# Patient Record
Sex: Female | Born: 1960 | Race: White | Hispanic: No | Marital: Married | Smoking: Never smoker
Health system: Southern US, Community
[De-identification: ages and names within clinical notes are randomized; demographics above are authoritative.]

## PROBLEM LIST (undated history)

## (undated) DIAGNOSIS — I8393 Asymptomatic varicose veins of bilateral lower extremities: Secondary | ICD-10-CM

## (undated) DIAGNOSIS — R600 Localized edema: Secondary | ICD-10-CM

## (undated) DIAGNOSIS — Z87898 Personal history of other specified conditions: Secondary | ICD-10-CM

## (undated) DIAGNOSIS — C8590 Non-Hodgkin lymphoma, unspecified, unspecified site: Secondary | ICD-10-CM

## (undated) DIAGNOSIS — R011 Cardiac murmur, unspecified: Secondary | ICD-10-CM

## (undated) DIAGNOSIS — G629 Polyneuropathy, unspecified: Secondary | ICD-10-CM

## (undated) DIAGNOSIS — D649 Anemia, unspecified: Secondary | ICD-10-CM

## (undated) DIAGNOSIS — F329 Major depressive disorder, single episode, unspecified: Secondary | ICD-10-CM

## (undated) DIAGNOSIS — M542 Cervicalgia: Secondary | ICD-10-CM

## (undated) DIAGNOSIS — F32A Depression, unspecified: Secondary | ICD-10-CM

## (undated) DIAGNOSIS — R569 Unspecified convulsions: Secondary | ICD-10-CM

## (undated) DIAGNOSIS — E785 Hyperlipidemia, unspecified: Secondary | ICD-10-CM

## (undated) DIAGNOSIS — G8929 Other chronic pain: Secondary | ICD-10-CM

## (undated) DIAGNOSIS — G4730 Sleep apnea, unspecified: Secondary | ICD-10-CM

## (undated) DIAGNOSIS — R51 Headache: Secondary | ICD-10-CM

## (undated) DIAGNOSIS — I471 Supraventricular tachycardia, unspecified: Secondary | ICD-10-CM

## (undated) DIAGNOSIS — R519 Headache, unspecified: Secondary | ICD-10-CM

## (undated) DIAGNOSIS — R609 Edema, unspecified: Secondary | ICD-10-CM

## (undated) DIAGNOSIS — I2510 Atherosclerotic heart disease of native coronary artery without angina pectoris: Secondary | ICD-10-CM

## (undated) DIAGNOSIS — Z973 Presence of spectacles and contact lenses: Secondary | ICD-10-CM

## (undated) DIAGNOSIS — I493 Ventricular premature depolarization: Secondary | ICD-10-CM

## (undated) DIAGNOSIS — M549 Dorsalgia, unspecified: Secondary | ICD-10-CM

## (undated) DIAGNOSIS — M793 Panniculitis, unspecified: Secondary | ICD-10-CM

## (undated) HISTORY — PX: CATARACT EXTRACTION: SUR2

## (undated) HISTORY — PX: CHOLECYSTECTOMY: SHX55

## (undated) HISTORY — PX: VENOUS THROMBECTOMY: SHX834

## (undated) HISTORY — DX: Polyneuropathy, unspecified: G62.9

## (undated) HISTORY — DX: Non-Hodgkin lymphoma, unspecified, unspecified site: C85.90

## (undated) HISTORY — DX: Supraventricular tachycardia, unspecified: I47.10

## (undated) HISTORY — DX: Personal history of other specified conditions: Z87.898

## (undated) HISTORY — DX: Depression, unspecified: F32.A

## (undated) HISTORY — PX: BREAST SURGERY: SHX581

## (undated) HISTORY — DX: Unspecified convulsions: R56.9

## (undated) HISTORY — DX: Atherosclerotic heart disease of native coronary artery without angina pectoris: I25.10

## (undated) HISTORY — PX: VENA CAVA FILTER PLACEMENT: SHX1085

## (undated) HISTORY — DX: Hyperlipidemia, unspecified: E78.5

## (undated) HISTORY — DX: Ventricular premature depolarization: I49.3

## (undated) HISTORY — PX: SKIN SURGERY: SHX2413

## (undated) HISTORY — PX: COLONOSCOPY: SHX174

## (undated) HISTORY — PX: OTHER SURGICAL HISTORY: SHX169

## (undated) HISTORY — DX: Localized edema: R60.0

## (undated) HISTORY — DX: Anemia, unspecified: D64.9

## (undated) HISTORY — DX: Edema, unspecified: R60.9

## (undated) HISTORY — DX: Supraventricular tachycardia: I47.1

## (undated) HISTORY — DX: Major depressive disorder, single episode, unspecified: F32.9

## (undated) HISTORY — PX: LYMPH NODE DISSECTION: SHX5087

## (undated) HISTORY — PX: CATARACT EXTRACTION W/ INTRAOCULAR LENS  IMPLANT, BILATERAL: SHX1307

---

## 2005-04-14 HISTORY — PX: GASTRIC BYPASS: SHX52

## 2008-10-14 ENCOUNTER — Encounter: Payer: Self-pay | Admitting: Cardiology

## 2008-10-17 ENCOUNTER — Encounter: Payer: Self-pay | Admitting: Cardiology

## 2008-10-18 ENCOUNTER — Ambulatory Visit: Payer: Self-pay | Admitting: Cardiology

## 2008-11-04 DIAGNOSIS — Z8669 Personal history of other diseases of the nervous system and sense organs: Secondary | ICD-10-CM | POA: Insufficient documentation

## 2008-11-04 DIAGNOSIS — Z87898 Personal history of other specified conditions: Secondary | ICD-10-CM | POA: Insufficient documentation

## 2008-11-04 DIAGNOSIS — F329 Major depressive disorder, single episode, unspecified: Secondary | ICD-10-CM

## 2008-11-14 ENCOUNTER — Ambulatory Visit: Payer: Self-pay

## 2008-11-14 ENCOUNTER — Encounter: Payer: Self-pay | Admitting: Cardiology

## 2008-11-29 ENCOUNTER — Ambulatory Visit: Payer: Self-pay | Admitting: Cardiology

## 2009-01-29 ENCOUNTER — Encounter: Payer: Self-pay | Admitting: Cardiology

## 2009-01-31 ENCOUNTER — Ambulatory Visit: Payer: Self-pay | Admitting: Cardiology

## 2009-01-31 DIAGNOSIS — R9431 Abnormal electrocardiogram [ECG] [EKG]: Secondary | ICD-10-CM

## 2009-02-06 ENCOUNTER — Telehealth (INDEPENDENT_AMBULATORY_CARE_PROVIDER_SITE_OTHER): Payer: Self-pay

## 2009-02-07 ENCOUNTER — Ambulatory Visit: Payer: Self-pay | Admitting: Cardiology

## 2009-02-07 ENCOUNTER — Ambulatory Visit: Payer: Self-pay

## 2009-02-07 ENCOUNTER — Encounter (HOSPITAL_COMMUNITY): Admission: RE | Admit: 2009-02-07 | Discharge: 2009-04-11 | Payer: Self-pay | Admitting: Cardiology

## 2009-02-14 ENCOUNTER — Telehealth (INDEPENDENT_AMBULATORY_CARE_PROVIDER_SITE_OTHER): Payer: Self-pay | Admitting: *Deleted

## 2009-02-16 ENCOUNTER — Ambulatory Visit: Payer: Self-pay | Admitting: Cardiology

## 2009-02-16 ENCOUNTER — Ambulatory Visit (HOSPITAL_COMMUNITY): Admission: RE | Admit: 2009-02-16 | Discharge: 2009-02-16 | Payer: Self-pay | Admitting: Cardiology

## 2010-05-14 NOTE — Consult Note (Signed)
Summary: [facility]  [facility]   Imported By: Marylou Mccoy 04/16/2009 12:21:10  _____________________________________________________________________  External Attachment:    Type:   Image     Comment:   External Document

## 2010-05-14 NOTE — Letter (Signed)
Summary: [facility]charge Instructions  [facility]   Imported By: Marylou Mccoy 04/16/2009 12:25:26  _____________________________________________________________________  External Attachment:    Type:   Image     Comment:   External Document

## 2010-07-17 LAB — BASIC METABOLIC PANEL
Calcium: 7.7 mg/dL — ABNORMAL LOW (ref 8.4–10.5)
Creatinine, Ser: 0.55 mg/dL (ref 0.4–1.2)
GFR calc Af Amer: >60 mL/min (ref >60)
GFR calc non Af Amer: >60 mL/min (ref >60)
Sodium: 138 mEq/L (ref 135–145)

## 2010-07-17 LAB — APTT: aPTT: 28 seconds (ref 24–37)

## 2010-07-17 LAB — CBC
Hemoglobin: 11.6 g/dL — ABNORMAL LOW (ref 12.0–15.0)
RBC: 3.56 MIL/uL — ABNORMAL LOW (ref 3.87–5.11)

## 2010-07-17 LAB — PROTIME-INR
INR: 1.08 (ref 0.00–1.49)
Prothrombin Time: 13.9 seconds (ref 11.6–15.2)

## 2010-08-27 NOTE — Assessment & Plan Note (Signed)
[facility]                            CARDIOLOGY OFFICE NOTE   Angela, Michael                     MRN:          [id number]  DATE:11/29/2008                            DOB:          1960/05/22    PRIMARY CARE PHYSICIAN:  Bennie Pierini, NP at [facility].   REASON FOR PRESENTATION:  Evaluate the patient with questionable  syncope.   HISTORY OF PRESENT ILLNESS:  The patient presents for followup of the  above.  Her previous presentation is extensively outlined in the July 7  note.  I was able to obtain records from [facility], which suggested a  seizure, but it was not conclusive.  There is no obvious cardiac  etiology identified when she had her event and was hospitalized there  earlier this year.  I did order a Holter monitor for 48 hours.  This  demonstrates some sinus bradycardia with an average rate at 55.  The  lowest was 38.  She had some premature ventricular contractions with one  couple an occasional bigeminy.  There were no symptoms related to this.  She had occasional rare premature atrial contractions.  She had no  sustained pauses or tachy arrhythmias.  No symptoms were reported.  She  also had an echocardiogram, which demonstrated no significant  abnormalities.   Since I last saw her, she has had no further events.  She said these  episodes have been few and far between as described.  She has had no  palpitations.  She has had no shortness of breath, PND, or orthopnea.  She will occasionally get some chest tightness when she is angry and  agitated and yelling at her daughter.  This has been a stable pattern.   PAST MEDICAL HISTORY:  Depression, gastric bypass (she lost 345 pounds),  blood clot resected from right arm, lymph nodes resected, removal of  excess skin.   ALLERGIES:  ADVICOR.   MEDICATIONS:  Vitamin D, fluoxetine, furosemide 40 mg daily,  multivitamin, and fentanyl patch.   REVIEW OF SYSTEMS:  As stated in the HPI and otherwise negative for all  other systems.   PHYSICAL EXAMINATION:  GENERAL:  The patient is pleasant and in no  distress.  VITAL SIGNS:  Blood pressure 110/70, heart rate 60 and regular, weight  173 pounds, body mass index is 30.  HEENT:  Eyes are unremarkable, pupils are equal, round, and reactive to  light, fundi not visualized, oral mucosa unremarkable.  NECK:  No  jugular venous distention at 45 degrees, carotid upstroke brisk and  symmetrical.  No bruits, no thyromegaly.  LYMPHATICS:  No adenopathy.  LUNGS:  Clear to auscultation bilaterally.  BACK:  No costovertebral angle tenderness.  CHEST:  Unremarkable.  HEART:  PMI is not displaced or sustained, S1 and S2 within normal.  No  S3, no S4, no clicks, no rubs, no murmurs.  ABDOMEN:  Obese, positive bowel sounds, normal in frequency and pitch,  no bruits, no rebound, no guarding, no midline pulsatile, no mass, no  organomegaly.  SKIN:  No rashes, no nodules.  EXTREMITIES:  2+ pulses, no edema, cyanosis, or clubbing.  NEUROLOGIC:  Oriented to person, place, and time.  Cranial nerves II  through XII grossly intact, motor grossly intact.   ASSESSMENT AND PLAN:  1. Syncope.  The patient had an episode of loss of consciousness with      questionable seizure.  I do not see a cardiac etiology.  She does      have sinus bradycardia and some rare ectopy, but I do not think      this is related.  She otherwise seems to have a structurally normal      heart.  At this point, I am will suggest a plain old exercise      treadmill test (POET) given the fact that she has some chest      discomfort when she is agitated.  However, I think this is a      pretest probability of obstructive coronary artery disease causing      this pain or as an etiology of her syncope is quite low.  I will      suggest that the treadmill test can be done at [facility].      I would suggest that she see a  neurologist as she has been treated      in the past for seizures and this was the discharge diagnosis when      she had her event recently.  I would be happy to see her following      this if they are still any cardiac questions.  2. Ventricular/atrial ectopy.  She is asymptomatic with this.  At this      point, no further therapy is suggested.  3. Obesity.  I applaud her tremendous weight loss and encouraged more      of the same.  4. Followup.  I will see her again as needed based on the above      suggested workup.     Rollene Rotunda, MD, [facility]  Electronically Signed    JH/MedQ  DD: 11/29/2008  DT: 11/30/2008  Job #: [id number]   cc:   Bennie Pierini

## 2010-08-27 NOTE — Assessment & Plan Note (Signed)
[facility]                            CARDIOLOGY OFFICE NOTE   Angela Michael, SCHEERER                     MRN:          [id number]  DATE:10/18/2008                            DOB:          March 25, 1961    PRIMARY CARE PHYSICIAN:  Bennie Pierini.   REASON FOR PRESENTATION:  Evaluate the patient with questionable  syncope.   HISTORY OF PRESENT ILLNESS:  The patient is 50 years old.  She had a  history of questionable seizure in March.  This was a witnessed event by  her husband and there was apparently some seizure activity.  It happened  while she was walking up an incline in her yard.  She was initially  treated with Dilantin, which was discontinued.  An EEG was obtained and  was normal per the patient's report.  She was taken off this medication.  She had another event on October 14, 2008.  She was standing in a [employer]  in [place].  She felt like she was going to lose consciousness.  She  told her daughters.  Next thing, she recalls she was in the emergency  room at [facility].  Her daughters apparently saw some seizure  activity, though they are not here to corroborate.  She was told that  she had CPR and her heart rate was very low.  She was, however, not  admitted to the hospital and was kept only in the emergency room.  Discharge instructions mentioned a seizure, but I do not see any other  records from that evaluation.  The patient did not urinate on herself.  She did not have any trauma.  She did not recall any palpitations.   Since that event, she has felt well.  She says that typically she is  active.  She does not exercise, but she does a lot of work around the  yard.  With this, she denies any chest discomfort, neck, or arm  discomfort.  She does not have any palpitations typically and has had no  orthostatic symptoms or presyncope.  She has had no shortness of breath.  Denies any PND or orthopnea.  She does have bradycardia, but she  says  her heart rates always been low.   PAST MEDICAL HISTORY:  Depression.   PAST SURGICAL HISTORY:  Gastric bypass (she says she had lost 345  pounds), blood clot resection from right arm, lymph node resected, and  removal of excess skin.   ALLERGIES:  ADVICOR.   MEDICATIONS:  1. Fentanyl patch.  2. Simvastatin 40 mg daily.  3. Gabapentin.  4. Vitamin D.  5. Furosemide 40 mg daily.  6. Fluoxetine 40 mg daily.  7. Aspirin 81 mg daily.  8. Loratadine 10 mg daily.  9. Calcium.  10.Flonase.  11.B12.   SOCIAL HISTORY:  The patient is not married.  She has 2 children.  She  currently does not work.  She is not smoking cigarettes.  She will  occasionally drink alcohol.   FAMILY HISTORY:  Noncontributory for early coronary artery disease,  sudden cardiac death, syncope, and cardiomyopathy.   REVIEW  OF SYSTEMS:  As stated in the HPI, otherwise negative for all  other systems.   PHYSICAL EXAMINATION:  GENERAL:  The patient is pleasant in no distress.  VITAL SIGNS:  Blood pressure is 124/76, heart rate 49 and regular, and  weight 173 pounds.  HEENT:  Eyelids are unremarkable; pupils equal, round, and reactive to  light; fundi not visualized; oral mucosa unremarkable.  NECK:  No jugular venous distention at 45 degrees; carotid upstroke  brisk and symmetric; no bruits, no thyromegaly.  LYMPHATICS:  No cervical, axillary, or inguinal adenopathy.  LUNGS:  Clear to auscultation bilaterally.  BACK:  No costovertebral angle tenderness.  CHEST:  Unremarkable.  HEART:  PMI not displaced or sustained; S1 and S2 within normal limits;  no S3, no S4; no clicks, no rubs, and no murmurs.  ABDOMEN:  Obese;  positive bowel sounds; normal in frequency and pitch; no bruits, no  rebound, no guarding; no midline pulsatile mass, no hepatomegaly, no  splenomegaly.  SKIN:  No rashes, no nodules.  EXTREMITIES:  2+ pulses throughout; no edema, no cyanosis, no clubbing.  NEUROLOGIC:  Oriented to  person, place, and time; cranial nerves II-XII  grossly intact; motor grossly intact.   EKG:  Sinus bradycardia, rate 42, axis within normal limits, intervals  within normal limits, no acute ST-T wave changes.   ASSESSMENT AND PLAN:  1. Syncope.  The patient had an episode and could it have been a      seizure versus syncope.  Observers and other physicians apparently      labeled this as a seizure disorder, but I am not clear on the      authentication of this diagnosis.  She does not have any symptoms      related to her resting bradycardia.  I would like to start from a      cardiac standpoint with a 48-hour Holter monitor and an      echocardiogram.  She does not want to have this done until she gets      back from a trip she has had planned.  We discussed some warning      signs and the conservative measures should she feel like she is      getting presyncopal.  She understand she cannot drive a car.  I am      also going to try to get records from [facility] to understand      their evaluation.  I have also encouraged her to followup with a      neurologist at [facility] to evaluate her apparently earlier this      year.  2. Dyslipidemia.  The patient reports she does not have      hyperlipidemia, but was put on the simvastatin as a precaution.  I      will deferred her primary care doctor.  3. Depression.  She says this is well treated.  She will continue on      the meds as listed.  4. Obesity.  She has lost a significant amount of weight with a      gastric bypass surgery and has      done beautifully with this.  5. Followup.  I will see her back in about 6 weeks or sooner if      needed.     Rollene Rotunda, MD, [facility]  Electronically Signed    JH/MedQ  DD: 10/18/2008  DT: 10/19/2008  Job #: [id number]   cc:  Bennie Pierini

## 2010-11-21 ENCOUNTER — Encounter: Payer: Self-pay | Admitting: Cardiology

## 2011-09-21 ENCOUNTER — Emergency Department (HOSPITAL_COMMUNITY): Payer: Medicaid Other

## 2011-09-21 ENCOUNTER — Emergency Department (HOSPITAL_COMMUNITY)
Admission: EM | Admit: 2011-09-21 | Discharge: 2011-09-21 | Disposition: A | Discharge status: Home / Self Care | Payer: Medicaid Other | Attending: Emergency Medicine | Admitting: Emergency Medicine

## 2011-09-21 ENCOUNTER — Encounter (HOSPITAL_COMMUNITY): Payer: Self-pay | Admitting: Emergency Medicine

## 2011-09-21 DIAGNOSIS — T148XXA Other injury of unspecified body region, initial encounter: Secondary | ICD-10-CM | POA: Insufficient documentation

## 2011-09-21 DIAGNOSIS — Z7982 Long term (current) use of aspirin: Secondary | ICD-10-CM | POA: Insufficient documentation

## 2011-09-21 DIAGNOSIS — M542 Cervicalgia: Secondary | ICD-10-CM | POA: Insufficient documentation

## 2011-09-21 DIAGNOSIS — Y92410 Unspecified street and highway as the place of occurrence of the external cause: Secondary | ICD-10-CM | POA: Insufficient documentation

## 2011-09-21 DIAGNOSIS — Z79899 Other long term (current) drug therapy: Secondary | ICD-10-CM | POA: Insufficient documentation

## 2011-09-21 DIAGNOSIS — G8929 Other chronic pain: Secondary | ICD-10-CM | POA: Insufficient documentation

## 2011-09-21 HISTORY — DX: Dorsalgia, unspecified: M54.9

## 2011-09-21 HISTORY — DX: Cervicalgia: M54.2

## 2011-09-21 HISTORY — DX: Other chronic pain: G89.29

## 2011-09-21 MED ORDER — METHOCARBAMOL 500 MG PO TABS: 1000.0000 mg | ORAL_TABLET | Freq: Four times a day (QID) | ORAL | Status: AC | PRN

## 2011-09-21 MED ORDER — HYDROMORPHONE HCL PF 1 MG/ML IJ SOLN
1.0000 mg | Freq: Once | INTRAMUSCULAR | Status: AC
  Administered 2011-09-21: 1 mg via INTRAMUSCULAR
  Filled 2011-09-21: qty 1

## 2011-09-21 MED ORDER — OXYCODONE-ACETAMINOPHEN 5-325 MG PO TABS: ORAL_TABLET | ORAL | Status: AC

## 2011-09-21 NOTE — Discharge Instructions (Signed)
RESOURCE GUIDE  Chronic Pain Problems: Contact [facility]  [phone] Patients need to be referred by their primary care doctor.  Insufficient Money for Medicine: Contact United Way:  call "211" or [facility] [phone].  No Primary Care Doctor: - Call [facility] Connect  [phone] - can help you locate a primary care doctor that  accepts your insurance, provides certain services, etc. - Physician Referral Service- 1-800-533-3463  Agencies that provide inexpensive medical care: - [facility]  [phone] - [facility]  [phone] - [facility]  [phone] - [facility] Clinic  [phone] - Planned Parenthood  [phone] - [facility]  [phone]  Medicaid-accepting [place] Providers: - [facility]- [address]  [phone], Mon-Fri 9am-7pm, Sat 9am-1pm - [facility]- [address], [address]  [phone] - [facility]- [address], [address]  [phone] - [facility]- [address] [place] [address]  [phone] - Veita Bland- [address], [address], [phone]  Only accepts [place] Access Medicaid patients after they have their name  applied to their card  Self Pay (no insurance) in [place]: - Sickle Cell Patients: Dr Eric Dean, [facility]  [address], [phone] - [facility]- [address]  [phone]       -     [facility]- [address]       -     [facility]- see information above (Speak to Pam H if you do not have insurance)       -  [facility]- [address], [phone]       -  [facility] [place]- [address],  [phone]       -  [facility]- [address] [place] [address], [phone]       -  Dr Osei-Bonsu-  [address], [place], [phone]       -  [facility]- [address], [phone]       -  [facility]- [address] [place] [address], [phone], also [address], [phone]       -    [facility]- [address], [phone], 1st & 3rd Saturday   every month, 10am-1pm  1) Find a Doctor and Pay Out of Pocket Although you won't have to find out who is covered by your insurance plan, it is a good idea to ask around and get recommendations. You will then need to call the office and see if the doctor you have chosen will accept you as a new patient and what types of options they offer for patients who are self-pay. Some doctors offer discounts or will set up payment plans for their patients who do not have insurance, but you will need to ask so you aren't surprised when you get to your appointment.  2) Contact Your Local Health Department Not all health departments have doctors that can see patients for sick visits, but many do, so it is worth a call to see if yours does. If you don't know where your local health department is, you can check in your phone book. The CDC also has a tool to help you locate your state's health department, and many state websites also have   listings of all of their local health departments.  3) Find a Walk-in Clinic If your illness is not likely to be very severe or complicated, you may want to try a walk in clinic. These are popping up all over the country in pharmacies, drugstores, and shopping centers. They're usually staffed by nurse practitioners or physician assistants that have been trained to treat common illnesses and complaints. They're usually fairly quick and inexpensive. However, if you have serious medical issues or chronic medical problems, these are probably not your best option  STD Testing - [place] [facility] [place], [facility] Clinic, [address], [place], phone [phone] or 1-877-539-9860.  Monday - Friday, call for an appointment. - [place]  [facility] [place], [facility]D Clinic, [address], [place], phone [phone] or 1-877-539-9860.  Monday - Friday, call for an appointment.  Abuse/Neglect: - [place] Child Abuse Hotline (336) 641-3795 - [place] Child Abuse Hotline 800-378-5315 (After Hours)  Emergency Shelter:  [facility] (336) 271-5985  Maternity Homes: - [facility] (336) 275-9566 - [facility] (704) 372-4663  MRSA Hotline #:   832-7006  [place] Resources  [facility] [place]  [employer] Way [place] [facility] [address]                 [address]         [address]  [place]                                               [place]                              [place] Phone:  [phone]                                  Phone:  [phone]                   Phone:  [phone]  [place] [facility], [phone] - [place] Services - [facility]- 1-888-581-9988       -     [facility] in [place], [address],                                  336-349-4454, Insurance  [place] Child Abuse Hotline (336) 342-1394 or (336) 342-3537 (After Hours)   Behavioral Health Services  Substance Abuse Resources: - [facility]  336-882-2125 - [facility] 336-784-9470 - [facility] 336-285-9073 - [facility] 336-845-3988 - Residential & Outpatient Substance Abuse Program  800-659-3381  Psychological Services: - [facility]  832-9600 - [facility]  [phone] - [place] [facility], [address], [place], ACCESS LINE: 1-800-853-5163 or 336-641-4981, Http://www.[facility].com/services/adult.htm  Dental Assistance  If unable to pay or uninsured, contact:  [facility] or [place] [facility] to become qualified for the adult dental  clinic.  Patients with Medicaid: [facility] [address], [phone], [phone]  If unable   to pay, or uninsured, contact [facility] (271-5999) or [place] [facility] ([phone] in [place], [phone] in [place]) to become qualified for the adult dental clinic  Other Low-Cost Community Dental Services: - [facility]- [address], [place], [place], [address], [phone], [address]. [address], 2nd and 4th Thursday of the month at 6:30am.  10 clients each day by appointment, can sometimes see walk-in patients if someone does not show for an appointment. - [facility]- [address], [place], [place], [address], [phone] - [facility]- [address], [place], [place], [address], [phone] - [place] [facility]- [phone] - [facility]- [phone] - [facility]- [phone]     Take the prescriptions as directed.  Apply moist heat or ice to the area(s) of discomfort, for 15 minutes at a time, several times per day for the next few days.  Do not fall asleep on a heating or ice pack.  Call your regular medical doctor on Monday to schedule a follow up appointment this week.  Return to the Emergency Department immediately if worsening.  

## 2011-09-21 NOTE — ED Notes (Signed)
Pt in xray
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
## 2012-07-08 ENCOUNTER — Other Ambulatory Visit: Payer: Self-pay | Admitting: Nurse Practitioner

## 2012-07-09 MED ORDER — FENTANYL 75 MCG/HR TD PT72: 1.0000 | MEDICATED_PATCH | TRANSDERMAL | Status: DC

## 2012-07-09 NOTE — Telephone Encounter (Signed)
rx up front for patient to pick up

## 2012-07-09 NOTE — Telephone Encounter (Signed)
Ready to be picked up

## 2012-08-06 ENCOUNTER — Encounter: Payer: Self-pay | Admitting: Nurse Practitioner

## 2012-08-06 ENCOUNTER — Ambulatory Visit (INDEPENDENT_AMBULATORY_CARE_PROVIDER_SITE_OTHER): Payer: Medicaid Other | Admitting: Nurse Practitioner

## 2012-08-06 VITALS — BP 111/71 | HR 47 | Temp 97.7°F | Ht 64.5 in | Wt 199.0 lb

## 2012-08-06 DIAGNOSIS — E785 Hyperlipidemia, unspecified: Secondary | ICD-10-CM

## 2012-08-06 DIAGNOSIS — R569 Unspecified convulsions: Secondary | ICD-10-CM

## 2012-08-06 DIAGNOSIS — R609 Edema, unspecified: Secondary | ICD-10-CM

## 2012-08-06 DIAGNOSIS — G5690 Unspecified mononeuropathy of unspecified upper limb: Secondary | ICD-10-CM

## 2012-08-06 DIAGNOSIS — D649 Anemia, unspecified: Secondary | ICD-10-CM

## 2012-08-06 DIAGNOSIS — E876 Hypokalemia: Secondary | ICD-10-CM

## 2012-08-06 DIAGNOSIS — G5691 Unspecified mononeuropathy of right upper limb: Secondary | ICD-10-CM

## 2012-08-06 LAB — ANEMIA PANEL 7
ABS Retic: 82.5 10*3/uL (ref 19.0–186.0)
Ferritin: 31 ng/mL (ref 10–291)
Folate: 15.2 ng/mL
MCHC: 33.7 g/dL (ref 30.0–36.0)
Platelets: 160 10*3/uL (ref 150–400)
RBC.: 3.93 MIL/uL (ref 3.87–5.11)
RDW: 14.7 % (ref 11.5–15.5)
TIBC: 374 ug/dL (ref 250–470)
WBC: 4.1 10*3/uL (ref 4.0–10.5)

## 2012-08-06 LAB — COMPLETE METABOLIC PANEL WITH GFR
ALT: 36 U/L — ABNORMAL HIGH (ref 0–35)
BUN: 16 mg/dL (ref 6–23)
CO2: 28 mEq/L (ref 19–32)
Calcium: 8.6 mg/dL (ref 8.4–10.5)
Creat: 0.84 mg/dL (ref 0.50–1.10)
GFR, Est African American: >89 mL/min
GFR, Est Non African American: 81 mL/min
Glucose, Bld: 115 mg/dL — ABNORMAL HIGH (ref 70–99)
Total Bilirubin: 0.8 mg/dL (ref 0.3–1.2)

## 2012-08-06 MED ORDER — FENTANYL 75 MCG/HR TD PT72: 1.0000 | MEDICATED_PATCH | TRANSDERMAL | Status: DC

## 2012-08-06 NOTE — Patient Instructions (Signed)

## 2012-08-06 NOTE — Progress Notes (Signed)
  Subjective:    Patient ID: Angela Michael, female    DOB: 04-06-1961, 52 y.o.   MRN: [id number]  Hyperlipidemia This is a chronic problem. The current episode started more than 1 year ago. The problem is controlled. Recent lipid tests were reviewed and are variable. Exacerbating diseases include obesity. There are no known factors aggravating her hyperlipidemia. Pertinent negatives include no chest pain, focal sensory loss, focal weakness, leg pain, myalgias or shortness of breath. Current antihyperlipidemic treatment includes statins. The current treatment provides significant improvement of lipids. Compliance problems include adherence to diet and adherence to exercise.  Risk factors for coronary artery disease include obesity and post-menopausal.  Hypokalemia Chronic- takes K+ daily. No C/o lower extremity cramping. Peripheral edema Lasix daily which helps. 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 Pernicious anemia b12 injection monthly- No c/o fatigue Depression Patient currently on prozac that works well for her. No C/O SE   Review of Systems  Respiratory: Negative for shortness of breath.   Cardiovascular: Negative for chest pain.  Musculoskeletal: Negative for myalgias.  Neurological: Negative for focal weakness.  All other systems reviewed and are negative.       Objective:   Physical Exam  Constitutional: She is oriented to person, place, and time. She appears well-developed and well-nourished.  HENT:  Nose: Nose normal.  Mouth/Throat: Oropharynx is clear and moist.  Eyes: EOM are normal.  Neck: Trachea normal, normal range of motion and full passive range of motion without pain. Neck supple. No JVD present. Carotid bruit is not present. No thyromegaly present.   Cardiovascular: Normal rate, regular rhythm, normal heart sounds and intact distal pulses.  Exam reveals no gallop and no friction rub.   No murmur heard. Pulmonary/Chest: Effort normal and breath sounds normal.  Abdominal: Soft. Bowel sounds are normal. She exhibits no distension and no mass. There is no tenderness.  Musculoskeletal: Normal range of motion.  Lymphadenopathy:    She has no cervical adenopathy.  Neurological: She is alert and oriented to person, place, and time. She has normal reflexes.  Pain along right upper arm on palpation  Skin: Skin is warm and dry.  Psychiatric: She has a normal mood and affect. Her behavior is normal. Judgment and thought content normal.   BP 111/71  Pulse 47  Temp(Src) 97.7 F (36.5 C) (Oral)  Ht 5' 4.5" (1.638 m)  Wt 199 lb (90.266 kg)  BMI 33.64 kg/m2        Assessment & Plan:  Peripheral edema  Hyperlipidemia with target LDL less than 100 - Plan: NMR Lipoprofile with Lipids  Convulsions/seizures  Neuropathy of upper extremity, right - Plan: fentaNYL (DURAGESIC - DOSED MCG/HR) 75 MCG/HR  Anemia - Plan: Anemia panel 7  Hypokalemia - Plan: COMPLETE METABOLIC PANEL WITH GFR  Continue all meds Labs pending Diet and exercise encouraged Fentanyl patch refilled F/U in 3 months Angela Daphine Deutscher, FNP

## 2012-08-09 LAB — NMR LIPOPROFILE WITH LIPIDS
HDL Size: 8.7 nm — ABNORMAL LOW (ref >=9.2)
HDL-C: 44 mg/dL (ref >=40)
LDL (calc): 85 mg/dL (ref <100)
LDL Particle Number: 1161 nmol/L — ABNORMAL HIGH (ref <1000)
LDL Size: 21 nm (ref >20.5)
LP-IR Score: 56 — ABNORMAL HIGH (ref <=45)
Small LDL Particle Number: 542 nmol/L — ABNORMAL HIGH (ref <=527)
VLDL Size: 50.9 nm — ABNORMAL HIGH (ref <=46.6)

## 2012-08-14 ENCOUNTER — Other Ambulatory Visit: Payer: Self-pay | Admitting: Nurse Practitioner

## 2012-08-28 ENCOUNTER — Other Ambulatory Visit: Payer: Self-pay | Admitting: Nurse Practitioner

## 2012-09-08 ENCOUNTER — Other Ambulatory Visit: Payer: Self-pay | Admitting: Nurse Practitioner

## 2012-09-08 DIAGNOSIS — G5691 Unspecified mononeuropathy of right upper limb: Secondary | ICD-10-CM

## 2012-09-09 ENCOUNTER — Telehealth: Payer: Self-pay | Admitting: Nurse Practitioner

## 2012-09-09 MED ORDER — FENTANYL 75 MCG/HR TD PT72: 1.0000 | MEDICATED_PATCH | TRANSDERMAL | Status: DC

## 2012-09-09 NOTE — Telephone Encounter (Signed)
Last seen 08/06/12 and also prescribed then. Call pt for pickup

## 2012-09-09 NOTE — Telephone Encounter (Signed)
rx up front and pt aware 

## 2012-09-09 NOTE — Telephone Encounter (Signed)
rx ready for pickup 

## 2012-10-06 ENCOUNTER — Other Ambulatory Visit: Payer: Self-pay | Admitting: Nurse Practitioner

## 2012-10-06 DIAGNOSIS — G5691 Unspecified mononeuropathy of right upper limb: Secondary | ICD-10-CM

## 2012-10-07 MED ORDER — FENTANYL 75 MCG/HR TD PT72: 1.0000 | MEDICATED_PATCH | TRANSDERMAL | Status: DC

## 2012-10-07 NOTE — Telephone Encounter (Signed)
last filled 09/09/12, last seen 08/06/12. Call pt when ready

## 2012-10-07 NOTE — Telephone Encounter (Signed)
rx ready for pickup 

## 2012-10-08 NOTE — Telephone Encounter (Signed)
RX up front

## 2012-10-27 ENCOUNTER — Telehealth: Payer: Self-pay
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
## 2012-11-08 ENCOUNTER — Encounter: Payer: Self-pay | Admitting: Nurse Practitioner

## 2012-11-08 ENCOUNTER — Ambulatory Visit (INDEPENDENT_AMBULATORY_CARE_PROVIDER_SITE_OTHER): Payer: Medicaid Other | Admitting: Nurse Practitioner

## 2012-11-08 VITALS — BP 104/69 | HR 55 | Temp 98.2°F | Ht 61.0 in | Wt 196.0 lb

## 2012-11-08 DIAGNOSIS — G5691 Unspecified mononeuropathy of right upper limb: Secondary | ICD-10-CM

## 2012-11-08 DIAGNOSIS — R609 Edema, unspecified: Secondary | ICD-10-CM

## 2012-11-08 DIAGNOSIS — E785 Hyperlipidemia, unspecified: Secondary | ICD-10-CM

## 2012-11-08 DIAGNOSIS — D649 Anemia, unspecified: Secondary | ICD-10-CM

## 2012-11-08 DIAGNOSIS — R569 Unspecified convulsions: Secondary | ICD-10-CM

## 2012-11-08 DIAGNOSIS — E876 Hypokalemia: Secondary | ICD-10-CM

## 2012-11-08 DIAGNOSIS — G5690 Unspecified mononeuropathy of unspecified upper limb: Secondary | ICD-10-CM

## 2012-11-08 MED ORDER — FLUTICASONE PROPIONATE 50 MCG/ACT NA SUSP: 2.0000 | NASAL | Status: DC | PRN

## 2012-11-08 MED ORDER — FENTANYL 75 MCG/HR TD PT72: 1.0000 | MEDICATED_PATCH | TRANSDERMAL | Status: DC

## 2012-11-08 NOTE — Patient Instructions (Addendum)

## 2012-11-08 NOTE — Progress Notes (Signed)
Subjective:    Patient ID: Angela Michael, female    DOB: 08-06-1960, 52 y.o.   MRN: [id number]  Hyperlipidemia This is a chronic problem. The current episode started more than 1 year ago. The problem is uncontrolled. Recent lipid tests were reviewed and are high. Pertinent negatives include no chest pain. Current antihyperlipidemic treatment includes statins. The current treatment provides moderate improvement of lipids. Compliance problems include adherence to diet and adherence to exercise.  Risk factors for coronary artery disease include obesity and post-menopausal.  Seizures Keppra working well no seizure in several years. Peripheral edema Lasix working well to keep edema under control. Hypokalemia K-Dur working well. No C/O lower ext aches Chronic right arm pain and neuropathy Duragesic patches makes pain tolerable- neurotin helps a lot Depression Prozac working well- has occassional bouts of depression but nothing she can't handle  * Diagnosed with Lymphoma 2 months ago and they are trying to determine at this time the stage that she is in- May start radiation soon.  Review of Systems  Constitutional: Negative.   HENT: Negative.   Respiratory: Negative for cough, choking and chest tightness.   Cardiovascular: Negative for chest pain.  Gastrointestinal: Negative.   Endocrine: Negative.   Genitourinary: Negative.   Musculoskeletal: Negative.   Allergic/Immunologic: Negative.   Neurological: Negative.   Psychiatric/Behavioral: Negative.        Objective:   Physical Exam  Constitutional: She is oriented to person, place, and time. She appears well-developed and well-nourished.  HENT:  Nose: Nose normal.  Mouth/Throat: Oropharynx is clear and moist.  Eyes: EOM are normal.  Neck: Trachea normal, normal range of motion and full passive range of motion without pain. Neck supple. No JVD present. Carotid bruit is not present. No thyromegaly present.  Cardiovascular: Normal  rate, regular rhythm, normal heart sounds and intact distal pulses.  Exam reveals no gallop and no friction rub.   No murmur heard. Pulmonary/Chest: Effort normal and breath sounds normal.  Abdominal: Soft. Bowel sounds are normal. She exhibits no distension and no mass. There is no tenderness.  Musculoskeletal: Normal range of motion.  Lymphadenopathy:    She has no cervical adenopathy.  Neurological: She is alert and oriented to person, place, and time. She has normal reflexes.  Skin: Skin is warm and dry.  Psychiatric: She has a normal mood and affect. Her behavior is normal. Judgment and thought content normal.     BP 104/69  Pulse 55  Temp(Src) 98.2 F (36.8 C) (Oral)  Ht 5\' 1"  (1.549 m)  Wt 196 lb (88.905 kg)  BMI 37.05 kg/m2      Assessment & Plan:   1. Peripheral edema   2. Hyperlipidemia with target LDL less than 100   3. Convulsions/seizures   4. Neuropathy of upper extremity, right   5. Anemia   6. Hypokalemia    Orders Placed This Encounter  Procedures  . CMP14+EGFR  . NMR, lipoprofile   Meds ordered this encounter  Medications  . fluticasone (FLONASE) 50 MCG/ACT nasal spray    Sig: Place 2 sprays into the nose as needed. For allergies    Dispense:  16 g    Refill:  5    Order Specific Question:  Supervising Provider    Answer:  Ernestina Penna [[id number]]  . fentaNYL (DURAGESIC - DOSED MCG/HR) 75 MCG/HR    Sig: Place 1 patch (75 mcg total) onto the skin every 3 (three) days.    Dispense:  10 patch  Refill:  0    Order Specific Question:  Supervising Provider    Answer:  Deborra Medina   Continue all othe rmeds Diet and exercise encouraged Health Maintenance reviewed  Mary-Margaret Daphine Deutscher, FNP

## 2012-11-09 LAB — CMP14+EGFR
ALT: 22 IU/L (ref 0–32)
AST: 24 IU/L (ref 0–40)
Albumin/Globulin Ratio: 1.9 (ref 1.1–2.5)
CO2: 23 mmol/L (ref 18–29)
Calcium: 8.4 mg/dL — ABNORMAL LOW (ref 8.7–10.2)
Creatinine, Ser: 0.88 mg/dL (ref 0.57–1.00)
GFR calc non Af Amer: 77 mL/min/{1.73_m2} (ref >59)
Globulin, Total: 2 g/dL (ref 1.5–4.5)
Glucose: 74 mg/dL (ref 65–99)
Potassium: 4.4 mmol/L (ref 3.5–5.2)
Sodium: 141 mmol/L (ref 134–144)
Total Protein: 5.7 g/dL — ABNORMAL LOW (ref 6.0–8.5)

## 2012-11-09 LAB — NMR, LIPOPROFILE

## 2012-11-09 LAB — LIPID PANEL
Chol/HDL Ratio: 2.3 ratio units (ref 0.0–4.4)
LDL Calculated: 56 mg/dL (ref 0–99)

## 2012-11-10 LAB — LIPID PANEL
Cholesterol, Total: 131 mg/dL (ref 100–199)
HDL: 57 mg/dL (ref >39)
Triglycerides: 86 mg/dL (ref 0–149)

## 2012-11-12 ENCOUNTER — Ambulatory Visit (INDEPENDENT_AMBULATORY_CARE_PROVIDER_SITE_OTHER): Payer: Medicaid Other | Admitting: Physician Assistant

## 2012-11-12 ENCOUNTER — Encounter: Payer: Self-pay | Admitting: Physician Assistant

## 2012-11-12 VITALS — BP 104/76 | HR 60 | Ht 61.75 in | Wt 195.1 lb

## 2012-11-12 DIAGNOSIS — R9431 Abnormal electrocardiogram [ECG] [EKG]: Secondary | ICD-10-CM

## 2012-11-12 DIAGNOSIS — R079 Chest pain, unspecified: Secondary | ICD-10-CM

## 2012-11-12 DIAGNOSIS — I498 Other specified cardiac arrhythmias: Secondary | ICD-10-CM

## 2012-11-12 DIAGNOSIS — I471 Supraventricular tachycardia: Secondary | ICD-10-CM

## 2012-11-12 MED ORDER — NITROGLYCERIN 0.4 MG SL SUBL: 0.4000 mg | SUBLINGUAL_TABLET | SUBLINGUAL | Status: DC | PRN

## 2012-11-12 NOTE — Patient Instructions (Signed)
   Nitroglycerin as needed for severe chest pain only Continue all other current medications. Your physician wants you to follow up in: 6 months.  You will receive a reminder letter in the mail one-two months in advance.  If you don't receive a letter, please call our office to schedule the follow up appointment

## 2012-11-12 NOTE — Assessment & Plan Note (Signed)
Status post gastric bypass surgery in 2007, at [facility]

## 2012-11-12 NOTE — Assessment & Plan Note (Addendum)
Continue current medication regimen consisting of low-dose ASA and statin therapy. Results of the recent dobutamine stress echocardiogram were reviewed with the patient, which was interpreted as a probable negative test for inducible ischemia. We also reviewed the results of her previous cardiac catheterization in 2010, following a false-positive stress test, with results as outlined above. If patient were to have recurrent CP, however, we will need to strongly consider proceeding with a repeat diagnostic coronary angiogram, given that she just completed a stress test. Of note, however, she denies any history of exertional CP, and her recent evaluation was notable for NL troponins and no focal WMAs on echocardiography.

## 2012-11-12 NOTE — Progress Notes (Signed)
Primary Cardiologist: Prentice Docker, MD (new)   HPI: 52 year old female with history of nonobstructive CAD, by cardiac catheterization in November 2010, at [facility], following a false positive stress test, who now presents for post hospital followup and further evaluation of recent episode of chest pain. She was recently briefly hospitalized at [facility], ruled out for MI with NL troponins, and had a CTA of the chest which was negative for pulmonary embolus.  She was then cleared to proceed with a dobutamine stress echocardiogram for risk stratification. Although she was able to attain 110% PMHR, the study was stopped secondary to development of SVT at 187 bpm. She was also noted to have frequent PVCs and NSVT. Rest images yielded EF 50-55%, with no focal WMAs. The study was interpreted as probably negative for inducible ischemia.  Clinically, she denies any history of exertional CP. She was hospitalized for a singular episode of CP, which occurred while laying in bed. She characterized it as sudden in onset, sharp in quality, and very intense (10/10). She also developed associated chills, diaphoresis, and trembling of the left cheek. Since this isolated episode, she has not had any recurrent CP.  Of note, patient underwent gastric bypass surgery in 2007, at [facility]. She denies any symptoms suggestive of active reflux disease. She also states that she is no longer on diabetic medication.   EKG today, reviewed by me, indicates NSR 60 bpm; poor R wave progression  Allergies  Allergen Reactions  . Advicor (Niacin-Lovastatin Er) Rash    Current Outpatient Prescriptions  Medication Sig Dispense Refill  . aspirin EC 81 MG tablet Take 81 mg by mouth daily.      Marland Kitchen atorvastatin (LIPITOR) 40 MG tablet TAKE 1 TABLET BY MOUTH AT BEDTIME  30 tablet  4  . Black Cohosh 40 MG CAPS Take 1 capsule by mouth daily.       . Cholecalciferol (VITAMIN D3) 400 UNITS tablet Take 400 Units by mouth daily.         . Cyanocobalamin (VITAMIN B-12 IJ) Inject 1 mL as directed every 30 (thirty) days.      . Fe Fum-FePoly-FA-Vit C-Vit B3 (INTEGRA F PO) Take by mouth.      . fentaNYL (DURAGESIC - DOSED MCG/HR) 75 MCG/HR Place 1 patch (75 mcg total) onto the skin every 3 (three) days.  10 patch  0  . FLUoxetine (PROZAC) 40 MG capsule Take 80 mg by mouth daily.       . fluticasone (FLONASE) 50 MCG/ACT nasal spray Place 2 sprays into the nose as needed. For allergies  16 g  5  . furosemide (LASIX) 40 MG tablet Take 1 tablet (40 mg total) by mouth daily.  30 tablet  2  . gabapentin (NEURONTIN) 300 MG capsule Take 600 mg by mouth 2 (two) times daily.       Marland Kitchen levETIRAcetam (KEPPRA) 500 MG tablet Take 1,000 mg by mouth 2 (two) times daily.       Marland Kitchen loratadine (CLARITIN) 10 MG tablet Take 10 mg by mouth as needed. For allergies      . Multiple Vitamin (MULTIVITAMIN WITH MINERALS) TABS Take 1 tablet by mouth daily.      . Nutritional Supplements (ESTROVEN PMS) TABS Take 1 tablet by mouth daily.       . potassium chloride SA (K-DUR,KLOR-CON) 20 MEQ tablet Take 20 mEq by mouth 2 (two) times daily. For 15 days      . nitroGLYCERIN (NITROSTAT) 0.4 MG SL tablet Place  1 tablet (0.4 mg total) under the tongue every 5 (five) minutes as needed for chest pain.  25 tablet  3   No current facility-administered medications for this visit.    Past Medical History  Diagnosis Date  . History of syncope   . Depression   . PVC's (premature ventricular contractions)   . Chronic pain   . Chronic back pain   . Chronic neck pain   . Peripheral edema   . Hyperlipidemia   . Lymphoma   . Anemia   . Seizures   . Neuropathy   . SVT (supraventricular tachycardia)     Past Surgical History  Procedure Laterality Date  . Gastric bypass      Says she had lost 345 lbs  . Venous thrombectomy      Blood clot resection from right arm  . Lymph node dissection      Resected  . Skin surgery      Removal of excess skin  . Vena cava  filter placement    . Lymph node removed from stomach      History   Social History  . Marital Status: Married    Spouse Name: N/A    Number of Children: N/A  . Years of Education: N/A   Occupational History  . Currently does not work    Social History Main Topics  . Smoking status: Never Smoker   . Smokeless tobacco: Not on file  . Alcohol Use: No     Comment: Occasionally  . Drug Use: No  . Sexually Active: Not on file   Other Topics Concern  . Not on file   Social History Narrative   Married with 2 children    Family History  Problem Relation Age of Onset  . Coronary artery disease Neg Hx   . Sudden death Neg Hx   . Cardiomyopathy Neg Hx   . Cancer Father   . Diabetes Father   . Heart failure Brother   . Cancer Other   . Diabetes Other     ROS: no nausea, vomiting; no fever, chills; no melena, hematochezia; no claudication  PHYSICAL EXAM: BP 104/76  Pulse 60  Ht 5' 1.75" (1.568 m)  Wt 195 lb 1.9 oz (88.506 kg)  BMI 36 kg/m2 GENERAL: 52 year old female, morbidly obese; NAD HEENT: NCAT, PERRLA, EOMI; sclera clear; no xanthelasma NECK: palpable bilateral carotid pulses, no bruits; unable to assess JVD, secondary to neck girth  LUNGS: CTA bilaterally CARDIAC: RRR (S1, S2); no significant murmurs; no rubs or gallops ABDOMEN: soft, protuberant EXTREMETIES: no significant peripheral edema SKIN: warm/dry; no obvious rash/lesions MUSCULOSKELETAL: no joint deformity NEURO: no focal deficit; NL affect   EKG: reviewed and available in Electronic Records   ASSESSMENT & PLAN:  SVT (supraventricular tachycardia) Based on the results of the recent dobutamine stress echocardiogram at [facility], indicating development of SVT at approximately 190 bpm during dobutamine infusion, as well as frequent PVCs and NSVT, we will proceed with a 21 day event monitor to exclude recurrent tachyarrhythmia at rest. It may be that this is what the patient was experiencing with her  recent symptoms, including CP. She denies any recurrent symptoms, however, and denies any history of exertional CP. We will also start her on beta blocker therapy with Toprol-XL 25 mg daily. We will then arrange for early followup and review of monitor results. Of note, I also gave her prescription for NTG, in the event she were to have recurrent CP. We did  review the results of her prior cardiac catheterization in 2010, which yielded nonobstructive CAD and NL LVF, following a false positive stress test.  Chest pain Continue current medication regimen consisting of low-dose ASA and statin therapy. Results of the recent dobutamine stress echocardiogram were reviewed with the patient, which was interpreted as a probable negative test for inducible ischemia. We also reviewed the results of her previous cardiac catheterization in 2010, following a false-positive stress test, with results as outlined above. If patient were to have recurrent CP, however, we will need to strongly consider proceeding with a repeat diagnostic coronary angiogram, given that she just completed a stress test. Of note, however, she denies any history of exertional CP, and her recent evaluation was notable for NL troponins and no focal WMAs on echocardiography.  Morbid obesity Status post gastric bypass surgery in 2007, at [facility]    Gene Reagan Behlke, [facility]

## 2012-11-12 NOTE — Assessment & Plan Note (Signed)
Based on the results of the recent dobutamine stress echocardiogram at [facility], indicating development of SVT at approximately 190 bpm during dobutamine infusion, as well as frequent PVCs and NSVT, we will proceed with a 21 day event monitor to exclude recurrent tachyarrhythmia at rest. It may be that this is what the patient was experiencing with her recent symptoms, including CP. She denies any recurrent symptoms, however, and denies any history of exertional CP. We will also start her on beta blocker therapy with Toprol-XL 25 mg daily. We will then arrange for early followup and review of monitor results. Of note, I also gave her prescription for NTG, in the event she were to have recurrent CP. We did review the results of her prior cardiac catheterization in 2010, which yielded nonobstructive CAD and NL LVF, following a false positive stress test.

## 2012-11-17 ENCOUNTER — Telehealth: Payer: Self-pay | Admitting: *Deleted

## 2012-11-17 DIAGNOSIS — I471 Supraventricular tachycardia: Secondary | ICD-10-CM

## 2012-11-17 NOTE — Telephone Encounter (Signed)
Patient informed that 21 day e-cardio heart monitor will be ordered per Gene Serpe, PA recent OV.  (SVT)  Will hold off on Toprol XL 25mg  daily for now as patient reports heart rates running 40-60's.  Patient verbalized understanding.

## 2012-11-18 NOTE — Telephone Encounter (Signed)
Monitor ordered today.

## 2012-11-21 ENCOUNTER — Other Ambulatory Visit: Payer: Self-pay | Admitting: Nurse Practitioner

## 2012-11-24 DIAGNOSIS — I471 Supraventricular tachycardia: Secondary | ICD-10-CM

## 2012-12-10 ENCOUNTER — Telehealth: Payer: Self-pay | Admitting: Nurse Practitioner

## 2012-12-14 ENCOUNTER — Other Ambulatory Visit: Payer: Self-pay

## 2012-12-14 DIAGNOSIS — G5691 Unspecified mononeuropathy of right upper limb: Secondary | ICD-10-CM

## 2012-12-14 NOTE — Telephone Encounter (Signed)
Last filled 11/08/12  Last seen 11/08/12 MMM   If approved print and route to nurse

## 2012-12-15 ENCOUNTER — Telehealth: Payer: Self-pay | Admitting: Nurse Practitioner

## 2012-12-15 MED ORDER — FENTANYL 75 MCG/HR TD PT72: 1.0000 | MEDICATED_PATCH | TRANSDERMAL | Status: DC

## 2012-12-15 NOTE — Telephone Encounter (Signed)
Pt aware.

## 2012-12-15 NOTE — Telephone Encounter (Signed)
Please inform script ready. thx

## 2013-01-11 ENCOUNTER — Other Ambulatory Visit: Payer: Self-pay | Admitting: Nurse Practitioner

## 2013-01-11 DIAGNOSIS — G5691 Unspecified mononeuropathy of right upper limb: Secondary | ICD-10-CM

## 2013-01-13 MED ORDER — FENTANYL 75 MCG/HR TD PT72: 1.0000 | MEDICATED_PATCH | TRANSDERMAL | Status: DC

## 2013-01-13 NOTE — Telephone Encounter (Signed)
Last seen 11/08/12, last filled 12/14/12

## 2013-01-13 NOTE — Telephone Encounter (Signed)
rx ready for pickup 

## 2013-01-14 ENCOUNTER — Other Ambulatory Visit: Payer: Self-pay | Admitting: Nurse Practitioner

## 2013-01-21 ENCOUNTER — Telehealth: Payer: Self-pay | Admitting: Nurse Practitioner

## 2013-01-24 ENCOUNTER — Ambulatory Visit (INDEPENDENT_AMBULATORY_CARE_PROVIDER_SITE_OTHER): Payer: Medicaid Other | Admitting: Cardiovascular Disease

## 2013-01-24 VITALS — BP 120/79 | HR 66 | Ht 61.75 in | Wt 206.0 lb

## 2013-01-24 DIAGNOSIS — I471 Supraventricular tachycardia: Secondary | ICD-10-CM

## 2013-01-24 DIAGNOSIS — R079 Chest pain, unspecified: Secondary | ICD-10-CM

## 2013-01-24 DIAGNOSIS — I498 Other specified cardiac arrhythmias: Secondary | ICD-10-CM

## 2013-01-24 DIAGNOSIS — Z8249 Family history of ischemic heart disease and other diseases of the circulatory system: Secondary | ICD-10-CM

## 2013-01-24 DIAGNOSIS — E785 Hyperlipidemia, unspecified: Secondary | ICD-10-CM

## 2013-01-24 NOTE — Patient Instructions (Signed)
Continue all current medications. Your physician wants you to follow up in: 6 months.  You will receive a reminder letter in the mail one-two months in advance.  If you don't receive a letter, please call our office to schedule the follow up appointment   

## 2013-01-24 NOTE — Progress Notes (Signed)
Patient ID: Angela Michael, female   DOB: [birth year], 52 y.o.   MRN: [id number]      SUBJECTIVE: Angela Michael is a 52 year old female with a history of nonobstructive CAD, by cardiac catheterization in November 2010, at [facility], following a false positive stress test. She was recently briefly hospitalized at [facility], ruled out for MI with NL troponins, and had a CTA of the chest which was negative for pulmonary embolus.   She was then cleared to proceed with a dobutamine stress echocardiogram for risk stratification. Although she was able to attain 110% PMHR, the study was stopped secondary to development of SVT at 187 bpm. She was also noted to have frequent PVCs and NSVT. Rest images yielded EF 50-55%, with no focal WMAs. The study was interpreted as probably negative for inducible ischemia.  Clinically, she denies any history of exertional CP. She was hospitalized for a singular episode of CP, which occurred while laying in bed. She characterized it as sudden in onset, sharp in quality, and very intense (10/10). She also developed associated chills, diaphoresis, and trembling of the left cheek. Since this isolated episode, she has not had any recurrent CP.  Of note, patient underwent gastric bypass surgery in 2007, at [facility]. She denies any symptoms suggestive of active reflux disease. She also states that she is no longer on diabetic medication.  She saw Gene Serpe PA-C in 11/2012, who ordered a monitor.  Her monitor showed predominantly normal sinus rhythm with occasional PAC's and PVC's noted. There were brief runs of SVT (max HR 130 bpm) and short runs of atrial fibrillation also noted.  She is currently undergoing chemotherapy every 2 months for the next 2 years for the treatment of lymphoma.  She had a brother who reportedly died of an MI 2 days ago, and was in his late 80's. She had another brother who died of an MI in his late 28's. There is reportedly no known family h/o premature  CAD. Her father died of kidney and lung cancer.  At present, she denies chest pain, shortness of breath, palpitations, and syncope. She has some chronic right calf tingling/numbness due to varicose veins.     Allergies  Allergen Reactions  . Advicor [Niacin-Lovastatin Er] Rash    Current Outpatient Prescriptions  Medication Sig Dispense Refill  . aspirin EC 81 MG tablet Take 81 mg by mouth daily.      Marland Kitchen atorvastatin (LIPITOR) 40 MG tablet TAKE 1 TABLET BY MOUTH AT BEDTIME  30 tablet  4  . Black Cohosh 40 MG CAPS Take 1 capsule by mouth daily.       . Cholecalciferol (VITAMIN D3) 400 UNITS tablet Take 400 Units by mouth daily.        . Cyanocobalamin (VITAMIN B-12 IJ) Inject 1 mL as directed every 30 (thirty) days.      . Fe Fum-FePoly-FA-Vit C-Vit B3 (INTEGRA F PO) Take by mouth.      . Fe Fum-FePoly-Vit C-Vit B3 (INTEGRA) 62.5-62.5-40-3 MG CAPS TAKE ONE CAPSULE BY MOUTH EVERY DAY  30 capsule  5  . fentaNYL (DURAGESIC - DOSED MCG/HR) 75 MCG/HR Place 1 patch (75 mcg total) onto the skin every 3 (three) days.  10 patch  0  . FLUoxetine (PROZAC) 40 MG capsule TAKE 2 CAPSULES BY MOUTH EVERY DAY  60 capsule  2  . fluticasone (FLONASE) 50 MCG/ACT nasal spray Place 2 sprays into the nose as needed. For allergies  16 g  5  . furosemide (  LASIX) 40 MG tablet TAKE 1 TABLET (40 MG TOTAL) BY MOUTH DAILY.  30 tablet  5  . gabapentin (NEURONTIN) 300 MG capsule TAKE 1 CAPSULE BY MOUTH 2 TIMES DAILY  60 capsule  2  . levETIRAcetam (KEPPRA) 500 MG tablet Take 1,000 mg by mouth 2 (two) times daily.       Marland Kitchen loratadine (CLARITIN) 10 MG tablet Take 10 mg by mouth as needed. For allergies      . Multiple Vitamin (MULTIVITAMIN WITH MINERALS) TABS Take 1 tablet by mouth daily.      . nitroGLYCERIN (NITROSTAT) 0.4 MG SL tablet Place 1 tablet (0.4 mg total) under the tongue every 5 (five) minutes as needed for chest pain.  25 tablet  3  . Nutritional Supplements (ESTROVEN PMS) TABS Take 1 tablet by mouth daily.        . potassium chloride SA (K-DUR,KLOR-CON) 20 MEQ tablet Take 20 mEq by mouth 2 (two) times daily. For 15 days       No current facility-administered medications for this visit.    Past Medical History  Diagnosis Date  . History of syncope   . Depression   . PVC's (premature ventricular contractions)   . Chronic pain   . Chronic back pain   . Chronic neck pain   . Peripheral edema   . Hyperlipidemia   . Lymphoma   . Anemia   . Seizures   . Neuropathy   . SVT (supraventricular tachycardia)     Past Surgical History  Procedure Laterality Date  . Gastric bypass      Says she had lost 345 lbs  . Venous thrombectomy      Blood clot resection from right arm  . Lymph node dissection      Resected  . Skin surgery      Removal of excess skin  . Vena cava filter placement    . Lymph node removed from stomach      History   Social History  . Marital Status: Married    Spouse Name: N/A    Number of Children: N/A  . Years of Education: N/A   Occupational History  . Currently does not work    Social History Main Topics  . Smoking status: Never Smoker   . Smokeless tobacco: Not on file  . Alcohol Use: No     Comment: Occasionally  . Drug Use: No  . Sexual Activity: Not on file   Other Topics Concern  . Not on file   Social History Narrative   Married with 2 children     Filed Vitals:   01/24/13 1508  BP: 120/79  Pulse: 66  Height: 5' 1.75" (1.568 m)  Weight: 206 lb (93.441 kg)    PHYSICAL EXAM General: NAD, obese Neck: No JVD, no thyromegaly or thyroid nodule.  Lungs: Clear to auscultation bilaterally with normal respiratory effort. CV: Nondisplaced PMI.  Heart regular S1/S2, no S3/S4, no murmur.  No peripheral edema.  No carotid bruit.  Normal pedal pulses.  Abdomen: Soft, nontender, no hepatosplenomegaly, no distention.  Neurologic: Alert and oriented x 3.  Psych: Normal affect. Extremities: No clubbing or cyanosis. Venous varicosities noted in  right calf.  ECG: reviewed and available in electronic records.      ASSESSMENT AND PLAN: 1. Chest pain: no further recurrences. Will continue to monitor for symptoms. Aggressive risk factor modification will be pursued. She is taking ASA 81 mg daily and Lipitor 40 mg daily. 2. SVT:  no long runs seen on monitoring, and no symptoms reported. No medication adjustments at this time. 3. Hyperlipidemia: on Lipitor.  Prentice Docker, M.D., F.A.C.C.

## 2013-02-09 ENCOUNTER — Ambulatory Visit: Payer: Medicaid Other | Admitting: Nurse Practitioner

## 2013-02-16 ENCOUNTER — Telehealth: Payer: Self-pay | Admitting: Nurse Practitioner

## 2013-02-16 DIAGNOSIS — G5691 Unspecified mononeuropathy of right upper limb: Secondary | ICD-10-CM

## 2013-02-16 MED ORDER — FENTANYL 75 MCG/HR TD PT72: 75.0000 ug | MEDICATED_PATCH | TRANSDERMAL | Status: DC

## 2013-02-16 NOTE — Telephone Encounter (Signed)
Aware. 

## 2013-02-16 NOTE — Telephone Encounter (Signed)
rx ready for pickup 

## 2013-03-07 ENCOUNTER — Ambulatory Visit (INDEPENDENT_AMBULATORY_CARE_PROVIDER_SITE_OTHER): Payer: Medicaid Other | Admitting: Nurse Practitioner

## 2013-03-07 ENCOUNTER — Encounter: Payer: Self-pay | Admitting: Nurse Practitioner

## 2013-03-07 VITALS — BP 144/88 | HR 44 | Temp 98.2°F | Ht 61.75 in

## 2013-03-07 DIAGNOSIS — G5690 Unspecified mononeuropathy of unspecified upper limb: Secondary | ICD-10-CM

## 2013-03-07 DIAGNOSIS — R569 Unspecified convulsions: Secondary | ICD-10-CM

## 2013-03-07 DIAGNOSIS — Z23 Encounter for immunization: Secondary | ICD-10-CM

## 2013-03-07 DIAGNOSIS — R609 Edema, unspecified: Secondary | ICD-10-CM

## 2013-03-07 DIAGNOSIS — E876 Hypokalemia: Secondary | ICD-10-CM

## 2013-03-07 DIAGNOSIS — I83893 Varicose veins of bilateral lower extremities with other complications: Secondary | ICD-10-CM

## 2013-03-07 DIAGNOSIS — E785 Hyperlipidemia, unspecified: Secondary | ICD-10-CM

## 2013-03-07 DIAGNOSIS — D649 Anemia, unspecified: Secondary | ICD-10-CM

## 2013-03-07 DIAGNOSIS — F329 Major depressive disorder, single episode, unspecified: Secondary | ICD-10-CM

## 2013-03-07 DIAGNOSIS — G5691 Unspecified mononeuropathy of right upper limb: Secondary | ICD-10-CM

## 2013-03-07 DIAGNOSIS — R600 Localized edema: Secondary | ICD-10-CM

## 2013-03-07 DIAGNOSIS — F3289 Other specified depressive episodes: Secondary | ICD-10-CM

## 2013-03-07 MED ORDER — FENTANYL 75 MCG/HR TD PT72: 75.0000 ug | MEDICATED_PATCH | TRANSDERMAL | Status: DC

## 2013-03-07 NOTE — Progress Notes (Signed)
Subjective:    Patient ID: Minerva Ends, female    DOB: 27-Mar-1961, 52 y.o.   MRN: [id number]  Christell Faith been in and out of the hospital with seizures over the last several months- neurologist attributes them to stress- Lots of studies and labs have been done at hospital which were all negative. Patinet has had 2 brothers die in the last 6 months which have caused the stress.  Hyperlipidemia This is a chronic problem. The current episode started more than 1 year ago. The problem is uncontrolled. Recent lipid tests were reviewed and are high. Pertinent negatives include no chest pain. Current antihyperlipidemic treatment includes statins. The current treatment provides moderate improvement of lipids. Compliance problems include adherence to diet and adherence to exercise.  Risk factors for coronary artery disease include obesity and post-menopausal.  Seizures Keppra working well no seizure in several years. Peripheral edema Lasix working well to keep edema under control. Hypokalemia K-Dur working well. No C/O lower ext aches Chronic right arm pain and neuropathy Duragesic patches makes pain tolerable- neurotin helps a lot Depression Prozac working well- has occassional bouts of depression but nothing she can't handle  * Diagnosed with Lymphoma 2 months ago and they are trying to determine at this time the stage that she is in- May start radiation soon.  Review of Systems  Constitutional: Negative.   HENT: Negative.   Respiratory: Negative for cough, choking and chest tightness.   Cardiovascular: Negative for chest pain.  Gastrointestinal: Negative.   Endocrine: Negative.   Genitourinary: Negative.   Musculoskeletal: Negative.   Allergic/Immunologic: Negative.   Neurological: Negative.   Psychiatric/Behavioral: Negative.   All other systems reviewed and are negative.       Objective:   Physical Exam  Constitutional: She is oriented to person, place, and time. She appears  well-developed and well-nourished.  HENT:  Nose: Nose normal.  Mouth/Throat: Oropharynx is clear and moist.  Eyes: EOM are normal.  Neck: Trachea normal, normal range of motion and full passive range of motion without pain. Neck supple. No JVD present. Carotid bruit is not present. No thyromegaly present.  Cardiovascular: Normal rate, regular rhythm, normal heart sounds and intact distal pulses.  Exam reveals no gallop and no friction rub.   No murmur heard. Rope like varicose veins right calf  Pulmonary/Chest: Effort normal and breath sounds normal.  Abdominal: Soft. Bowel sounds are normal. She exhibits no distension and no mass. There is no tenderness.  Musculoskeletal: Normal range of motion.  Lymphadenopathy:    She has no cervical adenopathy.  Neurological: She is alert and oriented to person, place, and time. She has normal reflexes.  Skin: Skin is warm and dry.  Psychiatric: She has a normal mood and affect. Her behavior is normal. Judgment and thought content normal.     BP 144/8  Pulse 44  Temp(Src) 98.2 F (36.8 C) (Oral)  Ht 5' 1.75" (1.568 m)      Assessment & Plan:   1. Peripheral edema   2. Neuropathy of upper extremity, unspecified laterality   3. Hypokalemia   4. Hyperlipidemia with target LDL less than 100   5. DEPRESSION   6. Convulsions/seizures   7. Anemia   8. Varicose veins of lower extremities with other complications   9. Neuropathy of upper extremity, right    Orders Placed This Encounter  Procedures  . CMP14+EGFR  . NMR, lipoprofile  . Ambulatory referral to Vascular Surgery    Referral Priority:  Routine  Referral Type:  Surgical    Referral Reason:  Specialty Services Required    Requested Specialty:  Vascular Surgery    Number of Visits Requested:  1   Meds ordered this encounter  Medications  . fentaNYL (DURAGESIC - DOSED MCG/HR) 75 MCG/HR    Sig: Place 1 patch (75 mcg total) onto the skin every 3 (three) days.    Dispense:  10  patch    Refill:  0    Do NOT FILL TILL 03/18/13    Order Specific Question:  Supervising Provider    Answer:  Ernestina Penna [[id number]]    Continue all meds Labs pending Diet and exercise encouraged Health maintenance reviewed Follow up in 3 months  Mary-Margaret Daphine Deutscher, FNP

## 2013-03-07 NOTE — Patient Instructions (Signed)
Stress Management Stress is a state of physical or mental tension that often results from changes in your life or normal routine. Some common causes of stress are:  Death of a loved one.  Injuries or severe illnesses.  Getting fired or changing jobs.  Moving into a new home. Other causes may be:  Sexual problems.  Business or financial losses.  Taking on a large debt.  Regular conflict with someone at home or at work.  Constant tiredness from lack of sleep. It is not just bad things that are stressful. It may be stressful to:  Win the lottery.  Get married.  Buy a new car. The amount of stress that can be easily tolerated varies from person to person. Changes generally cause stress, regardless of the types of change. Too much stress can affect your health. It may lead to physical or emotional problems. Too little stress (boredom) may also become stressful. SUGGESTIONS TO REDUCE STRESS:  Talk things over with your family and friends. It often is helpful to share your concerns and worries. If you feel your problem is serious, you may want to get help from a professional counselor.  Consider your problems one at a time instead of lumping them all together. Trying to take care of everything at once may seem impossible. List all the things you need to do and then start with the most important one. Set a goal to accomplish 2 or 3 things each day. If you expect to do too many in a single day you will naturally fail, causing you to feel even more stressed.  Do not use alcohol or drugs to relieve stress. Although you may feel better for a short time, they do not remove the problems that caused the stress. They can also be habit forming.  Exercise regularly - at least 3 times per week. Physical exercise can help to relieve that "uptight" feeling and will relax you.  The shortest distance between despair and hope is often a good night's sleep.  Go to bed and get up on time allowing  yourself time for appointments without being rushed.  Take a short "time-out" period from any stressful situation that occurs during the day. Close your eyes and take some deep breaths. Starting with the muscles in your face, tense them, hold it for a few seconds, then relax. Repeat this with the muscles in your neck, shoulders, hand, stomach, back and legs.  Take good care of yourself. Eat a balanced diet and get plenty of rest.  Schedule time for having fun. Take a break from your daily routine to relax. HOME CARE INSTRUCTIONS   Call if you feel overwhelmed by your problems and feel you can no longer manage them on your own.  Return immediately if you feel like hurting yourself or someone else. Document Released: 09/24/2000 Document Revised: 06/23/2011 Document Reviewed: 11/23/2012 [employer] Patient Information 2014 [employer].  

## 2013-03-08 ENCOUNTER — Other Ambulatory Visit: Payer: Self-pay | Admitting: Surgery

## 2013-03-08 DIAGNOSIS — I83893 Varicose veins of bilateral lower extremities with other complications: Secondary | ICD-10-CM

## 2013-03-08 LAB — NMR, LIPOPROFILE
Cholesterol: 119 mg/dL (ref <200)
HDL Particle Number: 32.7 umol/L (ref >=30.5)
LDLC SERPL CALC-MCNC: 51 mg/dL (ref <100)
LP-IR Score: 41 (ref <=45)
Small LDL Particle Number: 410 nmol/L (ref <=527)

## 2013-03-08 LAB — CMP14+EGFR
Albumin/Globulin Ratio: 1.9 (ref 1.1–2.5)
Albumin: 3.7 g/dL (ref 3.5–5.5)
Alkaline Phosphatase: 82 IU/L (ref 39–117)
BUN/Creatinine Ratio: 18 (ref 9–23)
BUN: 14 mg/dL (ref 6–24)
CO2: 28 mmol/L (ref 18–29)
Creatinine, Ser: 0.77 mg/dL (ref 0.57–1.00)
GFR calc Af Amer: 104 mL/min/{1.73_m2} (ref >59)
GFR calc non Af Amer: 90 mL/min/{1.73_m2} (ref >59)
Globulin, Total: 1.9 g/dL (ref 1.5–4.5)
Sodium: 142 mmol/L (ref 134–144)
Total Bilirubin: 0.6 mg/dL (ref 0.0–1.2)

## 2013-03-11 ENCOUNTER — Other Ambulatory Visit: Payer: Self-pay | Admitting: Nurse Practitioner

## 2013-03-15 NOTE — Telephone Encounter (Signed)
Last seen MMM  03/07/13

## 2013-04-12 ENCOUNTER — Telehealth: Payer: Self-pay | Admitting: Nurse Practitioner

## 2013-04-12 DIAGNOSIS — G5691 Unspecified mononeuropathy of right upper limb: Secondary | ICD-10-CM

## 2013-04-13 MED ORDER — FENTANYL 75 MCG/HR TD PT72: 75.0000 ug | MEDICATED_PATCH | TRANSDERMAL | Status: DC

## 2013-04-13 NOTE — Telephone Encounter (Signed)
Pt aware can pick up friday

## 2013-04-13 NOTE — Telephone Encounter (Signed)
Can you please have someone fill her femtanyl patches-thank yoiu

## 2013-04-13 NOTE — Telephone Encounter (Signed)
This is okay to refill the fentanyl patches for this patient

## 2013-04-14 ENCOUNTER — Other Ambulatory Visit: Payer: Self-pay | Admitting: Nurse Practitioner

## 2013-04-15 ENCOUNTER — Encounter: Payer: Self-pay | Admitting: Surgery

## 2013-04-18 ENCOUNTER — Encounter: Payer: Medicaid Other | Admitting: Surgery

## 2013-04-18 ENCOUNTER — Encounter (HOSPITAL_COMMUNITY): Payer: Medicaid Other

## 2013-04-22 ENCOUNTER — Encounter (HOSPITAL_COMMUNITY): Payer: Medicaid Other

## 2013-04-22 ENCOUNTER — Encounter: Payer: Medicaid Other | Admitting: Vascular Surgery

## 2013-05-02 ENCOUNTER — Other Ambulatory Visit: Payer: Self-pay | Admitting: Nurse Practitioner

## 2013-05-05 NOTE — Telephone Encounter (Signed)
Patient out of medicine

## 2013-05-12 ENCOUNTER — Telehealth: Payer: Self-pay | Admitting: Nurse Practitioner

## 2013-05-12 MED ORDER — FENTANYL 75 MCG/HR TD PT72: 75.0000 ug | MEDICATED_PATCH | TRANSDERMAL | Status: DC

## 2013-05-12 NOTE — Telephone Encounter (Signed)
Pt notified to pick up rx.

## 2013-05-12 NOTE — Telephone Encounter (Signed)
rx ready for pickup 

## 2013-05-20 ENCOUNTER — Encounter: Payer: Self-pay | Admitting: Surgery

## 2013-05-23 ENCOUNTER — Ambulatory Visit (INDEPENDENT_AMBULATORY_CARE_PROVIDER_SITE_OTHER): Payer: Medicaid Other | Admitting: Surgery

## 2013-05-23 ENCOUNTER — Ambulatory Visit (HOSPITAL_COMMUNITY)
Admission: RE | Admit: 2013-05-23 | Discharge: 2013-05-23 | Disposition: A | Discharge status: Home / Self Care | Payer: Medicaid Other | Source: Ambulatory Visit | Attending: Surgery | Admitting: Surgery

## 2013-05-23 ENCOUNTER — Encounter (INDEPENDENT_AMBULATORY_CARE_PROVIDER_SITE_OTHER): Payer: Self-pay

## 2013-05-23 ENCOUNTER — Encounter: Payer: Self-pay | Admitting: Surgery

## 2013-05-23 VITALS — BP 109/72 | HR 63 | Ht 67.0 in | Wt 196.0 lb

## 2013-05-23 DIAGNOSIS — I83893 Varicose veins of bilateral lower extremities with other complications: Secondary | ICD-10-CM

## 2013-05-23 DIAGNOSIS — Z87898 Personal history of other specified conditions: Secondary | ICD-10-CM | POA: Insufficient documentation

## 2013-05-23 DIAGNOSIS — Z9884 Bariatric surgery status: Secondary | ICD-10-CM | POA: Insufficient documentation

## 2013-05-23 DIAGNOSIS — Z86711 Personal history of pulmonary embolism: Secondary | ICD-10-CM | POA: Insufficient documentation

## 2013-05-23 DIAGNOSIS — I8393 Asymptomatic varicose veins of bilateral lower extremities: Secondary | ICD-10-CM | POA: Insufficient documentation

## 2013-05-23 DIAGNOSIS — I8390 Asymptomatic varicose veins of unspecified lower extremity: Secondary | ICD-10-CM | POA: Insufficient documentation

## 2013-05-23 NOTE — Progress Notes (Signed)
Patient name: Angela Michael MRN: [id number] DOB: 1960/08/04 Sex: female   Referred by: Dr. Hassell Done  Reason for referral:  Chief Complaint  Patient presents with  . Varicose Veins    new pt, bilageral vv's w/ edema and burning    HISTORY OF PRESENT ILLNESS: This is a 53 year old female who is referred today for evaluation of leg pain.  She states that the right leg bothers her more than the left.  This has been going on for a while but has gotten worse recently.  She describes burning pain around her medial calf over top of her varicose veins.  She does not endorse a significant amount swelling, however she has been on Lasix which predates morbid obesity operation where she weighed over 500 pounds.  She denies having episodes of bleeding.  There are no aggravating or relieving factors.  The patient is treated for seizure disorder.  She has not had a major procedure for many years.  Her cholesterol is medically managed with a statin.  She is a diabetic but has not been on medication since her gastric bypass surgery.  Past Medical History  Diagnosis Date  . History of syncope   . Depression   . PVC's (premature ventricular contractions)   . Chronic pain   . Chronic back pain   . Chronic neck pain   . Peripheral edema   . Hyperlipidemia   . Anemia   . Seizures   . Neuropathy   . SVT (supraventricular tachycardia)   . Diabetes mellitus without complication   . Lymphoma     Past Surgical History  Procedure Laterality Date  . Gastric bypass      Says she had lost 345 lbs  . Venous thrombectomy      Blood clot resection from right arm  . Lymph node dissection      Resected  . Skin surgery      Removal of excess skin  . Vena cava filter placement    . Lymph node removed from stomach    . Cholecystectomy    . Cataract extraction Right     History   Social History  . Marital Status: Married    Spouse Name: N/A    Number of Children: N/A  . Years of Education: N/A     Occupational History  . Currently does not work    Social History Main Topics  . Smoking status: Never Smoker   . Smokeless tobacco: Not on file  . Alcohol Use: Yes     Comment: Occasionally  . Drug Use: No  . Sexual Activity: Not on file   Other Topics Concern  . Not on file   Social History Narrative   Married with 2 children    Family History  Problem Relation Age of Onset  . Coronary artery disease Neg Hx   . Sudden death Neg Hx   . Cardiomyopathy Neg Hx   . Cancer Father   . Diabetes Father   . Heart failure Brother   . Heart attack Brother   . Cancer Other   . Diabetes Other     Allergies as of 05/23/2013 - Review Complete 05/23/2013  Allergen Reaction Noted  . Advicor [niacin-lovastatin er] Rash 08/06/2012    Current Outpatient Prescriptions on File Prior to Visit  Medication Sig Dispense Refill  . aspirin EC 81 MG tablet Take 81 mg by mouth daily.      Marland Kitchen atorvastatin (LIPITOR) 40 MG tablet TAKE  1 TABLET BY MOUTH AT BEDTIME  30 tablet  4  . Cholecalciferol (VITAMIN D3) 400 UNITS tablet Take 400 Units by mouth daily.        . Cyanocobalamin (VITAMIN B-12 IJ) Inject 1 mL as directed every 30 (thirty) days.      . Fe Fum-FePoly-Vit C-Vit B3 (INTEGRA) 62.5-62.5-40-3 MG CAPS TAKE ONE CAPSULE BY MOUTH EVERY DAY  30 capsule  5  . fentaNYL (DURAGESIC - DOSED MCG/HR) 75 MCG/HR Place 1 patch (75 mcg total) onto the skin every 3 (three) days.  10 patch  0  . FLUoxetine (PROZAC) 40 MG capsule TAKE 2 CAPSULES BY MOUTH EVERY DAY  60 capsule  2  . fluticasone (FLONASE) 50 MCG/ACT nasal spray Place 2 sprays into the nose as needed. For allergies  16 g  5  . furosemide (LASIX) 40 MG tablet TAKE 1 TABLET (40 MG TOTAL) BY MOUTH DAILY.  30 tablet  5  . gabapentin (NEURONTIN) 300 MG capsule TAKE 1 CAPSULE BY MOUTH 2 TIMES DAILY  60 capsule  1  . levETIRAcetam (KEPPRA) 500 MG tablet TAKE 2 TABLETS BY MOUTH 2 TIMES DAILY  120 tablet  4  . loratadine (CLARITIN) 10 MG tablet Take  10 mg by mouth as needed. For allergies      . Multiple Vitamin (MULTIVITAMIN WITH MINERALS) TABS Take 1 tablet by mouth daily.      . nitroGLYCERIN (NITROSTAT) 0.4 MG SL tablet Place 1 tablet (0.4 mg total) under the tongue every 5 (five) minutes as needed for chest pain.  25 tablet  3  . Nutritional Supplements (ESTROVEN PMS) TABS Take 1 tablet by mouth daily.       . potassium chloride SA (K-DUR,KLOR-CON) 20 MEQ tablet Take 20 mEq by mouth 2 (two) times daily. For 15 days       No current facility-administered medications on file prior to visit.     REVIEW OF SYSTEMS: Cardiovascular: No chest pain, chest pressure, palpitations, orthopnea, or dyspnea on exertion. No claudication or rest pain,  No history of DVT or phlebitis. Pulmonary: No productive cough, asthma or wheezing. Neurologic: Weakness and right arm as well as numbness. Hematologic: No bleeding problems or clotting disorders. Musculoskeletal: No joint pain or joint swelling. Gastrointestinal: No blood in stool or hematemesis Genitourinary: No dysuria or hematuria. Psychiatric:: No history of major depression. Integumentary: No rashes or ulcers. Constitutional: No fever or chills.  PHYSICAL EXAMINATION: General: The patient appears their stated age.  Vital signs are BP 109/72  Pulse 63  Ht 5\' 7"  (1.702 m)  Wt 196 lb (88.905 kg)  BMI 30.69 kg/m2  SpO2 95% HEENT:  No gross abnormalities Pulmonary: Respirations are non-labored Abdomen: Soft and non-tender  Musculoskeletal: There are no major deformities.   Neurologic: No focal weakness or paresthesias are detected, Skin: There are no ulcer or rashes noted. Psychiatric: The patient has normal affect. Cardiovascular: There is a regular rate and rhythm without significant murmur appreciated.  Palpable left dorsalis pedis pulse, I cannot palpate a right.  She has varicosities on the posterior medial side of the right calf.  These are very sensitive to touch.  Trace edema is  identified.  No significant hyperpigmentation is noted bilaterally.  She also has varicosities which are not as prominent on the left.  Diagnostic Studies: Duplex ultrasound was ordered and reviewed.  On the left leg she has deep vein reflux.  She also has short saphenous reflux with maximum diameter of 0.75 cm.  On  the right there is no evidence of deep vein reflux.  She has short saphenous reflux with maximum vein diameter of 0.6 cm.  There is no evidence of acute bilateral DVT.  Assessment:  Bilateral venous insufficiency, right greater than left Plan: I discussed with the patient that I feel her symptoms are somewhat atypical for venous insufficiency, however the varicosities in the right calf are very tender and sensitive to the touch.  She also has short saphenous reflux on this side.  She may be somebody that would be a good candidate for ablation of the short saphenous vein with stab phlebectomy of her calf varicosities.  I discussed with her this may not give her complete symptomatic relief but will most likely aid her in her discomfort.  I felt the most appropriate initial course of action was to place her in compression stockings to see how much benefit she gets.  She will follow up in 3 months     V. Leia Alf, M.D. [facility] Office: [phone] Pager:  [phone]

## 2013-06-03 ENCOUNTER — Encounter: Payer: Self-pay | Admitting: Nurse Practitioner

## 2013-06-03 ENCOUNTER — Ambulatory Visit (INDEPENDENT_AMBULATORY_CARE_PROVIDER_SITE_OTHER): Payer: Medicaid Other | Admitting: Nurse Practitioner

## 2013-06-03 VITALS — BP 132/81 | HR 86 | Temp 97.3°F | Ht 61.0 in

## 2013-06-03 DIAGNOSIS — E785 Hyperlipidemia, unspecified: Secondary | ICD-10-CM

## 2013-06-03 DIAGNOSIS — R600 Localized edema: Secondary | ICD-10-CM

## 2013-06-03 DIAGNOSIS — F329 Major depressive disorder, single episode, unspecified: Secondary | ICD-10-CM

## 2013-06-03 DIAGNOSIS — R195 Other fecal abnormalities: Secondary | ICD-10-CM

## 2013-06-03 DIAGNOSIS — K921 Melena: Secondary | ICD-10-CM

## 2013-06-03 DIAGNOSIS — R609 Edema, unspecified: Secondary | ICD-10-CM

## 2013-06-03 DIAGNOSIS — F3289 Other specified depressive episodes: Secondary | ICD-10-CM

## 2013-06-03 DIAGNOSIS — G5690 Unspecified mononeuropathy of unspecified upper limb: Secondary | ICD-10-CM

## 2013-06-03 DIAGNOSIS — D649 Anemia, unspecified: Secondary | ICD-10-CM

## 2013-06-03 DIAGNOSIS — E876 Hypokalemia: Secondary | ICD-10-CM

## 2013-06-03 MED ORDER — GABAPENTIN 300 MG PO CAPS: ORAL_CAPSULE | ORAL | Status: DC

## 2013-06-03 MED ORDER — FENTANYL 75 MCG/HR TD PT72: 75.0000 ug | MEDICATED_PATCH | TRANSDERMAL | Status: DC

## 2013-06-03 MED ORDER — INTEGRA 62.5-62.5-40-3 MG PO CAPS: ORAL_CAPSULE | ORAL | Status: DC

## 2013-06-03 MED ORDER — LEVETIRACETAM 500 MG PO TABS: ORAL_TABLET | ORAL | Status: DC

## 2013-06-03 NOTE — Patient Instructions (Signed)

## 2013-06-03 NOTE — Progress Notes (Signed)
Subjective:    Patient ID: Angela Michael, female    DOB: 26-Jan-1961, 53 y.o.   MRN: [id number]  Patient in today for 3 month follow up- SHe has been going through a lot lately- diagnosed with lymphoma and has been in and out of hospital. Has  started treatments - and is also on prednisone which has caused weight gain. SHe is also c/o black tarry stools. Very fatigued.  Hyperlipidemia This is a chronic problem. The current episode started more than 1 year ago. The problem is uncontrolled. Recent lipid tests were reviewed and are high. Pertinent negatives include no chest pain. Current antihyperlipidemic treatment includes statins. The current treatment provides moderate improvement of lipids. Compliance problems include adherence to diet and adherence to exercise.  Risk factors for coronary artery disease include obesity and post-menopausal.  Seizures Keppra working well no seizure in several years. Peripheral edema Lasix working well to keep edema under control. Hypokalemia K-Dur working well. No C/O lower ext aches Chronic right arm pain and neuropathy Duragesic patches makes pain tolerable- neurotin helps a lot Depression Prozac working well- has occassional bouts of depression but nothing she can't handle  *Review of Systems  Constitutional: Negative.   HENT: Negative.   Respiratory: Negative for cough, choking and chest tightness.   Cardiovascular: Negative for chest pain.  Gastrointestinal: Negative.   Endocrine: Negative.   Genitourinary: Negative.   Musculoskeletal: Negative.   Allergic/Immunologic: Negative.   Neurological: Negative.   Psychiatric/Behavioral: Negative.        Objective:   Physical Exam  Constitutional: She is oriented to person, place, and time. She appears well-developed and well-nourished.  HENT:  Nose: Nose normal.  Mouth/Throat: Oropharynx is clear and moist.  Eyes: EOM are normal.  Neck: Trachea normal, normal range of motion and full passive  range of motion without pain. Neck supple. No JVD present. Carotid bruit is not present. No thyromegaly present.  Cardiovascular: Normal rate, regular rhythm, normal heart sounds and intact distal pulses.  Exam reveals no gallop and no friction rub.   No murmur heard. Pulmonary/Chest: Effort normal and breath sounds normal.  Abdominal: Soft. Bowel sounds are normal. She exhibits no distension and no mass. There is no tenderness.  Musculoskeletal: Normal range of motion.  Lymphadenopathy:    She has no cervical adenopathy.  Neurological: She is alert and oriented to person, place, and time. She has normal reflexes.  Skin: Skin is warm and dry.  Psychiatric: She has a normal mood and affect. Her behavior is normal. Judgment and thought content normal.     BP 132/81  Pulse 86  Temp(Src) 97.3 F (36.3 C) (Oral)  Ht _0  (1.549 m)      Assessment & Plan:   1. Black stools   2. Peripheral edema   3. Neuropathy of upper extremity   4. Hypokalemia   5. DEPRESSION   6. Hyperlipidemia with target LDL less than 100   7. Anemia    Orders Placed This Encounter  Procedures  . Anemia Profile B  . CMP14+EGFR  . NMR, lipoprofile  . Ambulatory referral to Gastroenterology    Referral Priority:  Routine    Referral Type:  Consultation    Referral Reason:  Specialty Services Required    Requested Specialty:  Gastroenterology    Number of Visits Requested:  1   Meds ordered this encounter  Medications  . gabapentin (NEURONTIN) 300 MG capsule    Sig: TAKE 1 CAPSULE BY MOUTH 2 TIMES DAILY  Dispense:  60 capsule    Refill:  5    Order Specific Question:  Supervising Provider    Answer:  Chipper Herb [[id number]]  . levETIRAcetam (KEPPRA) 500 MG tablet    Sig: TAKE 2 TABLETS BY MOUTH 2 TIMES DAILY    Dispense:  120 tablet    Refill:  5    Order Specific Question:  Supervising Provider    Answer:  Chipper Herb [[id number]]  . Fe Fum-FePoly-Vit C-Vit B3 (INTEGRA) 62.5-62.5-40-3 MG CAPS     Sig: TAKE ONE CAPSULE BY MOUTH EVERY DAY    Dispense:  30 capsule    Refill:  5    Order Specific Question:  Supervising Provider    Answer:  Chipper Herb [[id number]]  . fentaNYL (DURAGESIC - DOSED MCG/HR) 75 MCG/HR    Sig: Place 1 patch (75 mcg total) onto the skin every 3 (three) days.    Dispense:  10 patch    Refill:  0    Do not fill till 06/12/13    Order Specific Question:  Supervising Provider    Answer:  Chipper Herb [[id number]]   Labs pending Health maintenance reviewed Diet and exercise encouraged Continue all meds Follow up  In 3 months   [place], FNP

## 2013-06-06 LAB — NMR, LIPOPROFILE
CHOLESTEROL: 148 mg/dL (ref <200)
HDL Cholesterol by NMR: 55 mg/dL (ref >=40)
HDL PARTICLE NUMBER: 36.6 umol/L (ref >=30.5)
LDL Particle Number: 936 nmol/L (ref <1000)
LDL SIZE: 20.7 nm (ref >20.5)
LDLC SERPL CALC-MCNC: 68 mg/dL (ref <100)
LP-IR Score: 27 (ref <=45)
SMALL LDL PARTICLE NUMBER: 466 nmol/L (ref <=527)
Triglycerides by NMR: 127 mg/dL (ref <150)

## 2013-06-06 LAB — ANEMIA PROFILE B
BASOS ABS: 0 10*3/uL (ref 0.0–0.2)
Basos: 1 %
Eos: 3 %
Eosinophils Absolute: 0.2 10*3/uL (ref 0.0–0.4)
FERRITIN: 99 ng/mL (ref 15–150)
Folate: 14.5 ng/mL (ref >3.0)
HEMATOCRIT: 37.5 % (ref 34.0–46.6)
Hemoglobin: 12.8 g/dL (ref 11.1–15.9)
IRON SATURATION: 28 % (ref 15–55)
IRON: 80 ug/dL (ref 35–155)
Immature Grans (Abs): 0 10*3/uL (ref 0.0–0.1)
Immature Granulocytes: 0 %
LYMPHS ABS: 1 10*3/uL (ref 0.7–3.1)
Lymphs: 20 %
MCH: 31.2 pg (ref 26.6–33.0)
MCHC: 34.1 g/dL (ref 31.5–35.7)
MCV: 92 fL (ref 79–97)
MONOCYTES: 9 %
MONOS ABS: 0.5 10*3/uL (ref 0.1–0.9)
NEUTROS ABS: 3.4 10*3/uL (ref 1.4–7.0)
Neutrophils Relative %: 67 %
Platelets: 205 10*3/uL (ref 150–379)
RBC: 4.1 x10E6/uL (ref 3.77–5.28)
RDW: 13.4 % (ref 12.3–15.4)
Retic Ct Pct: 2.2 % (ref 0.6–2.6)
TIBC: 282 ug/dL (ref 250–450)
UIBC: 202 ug/dL (ref 150–375)
Vitamin B-12: 375 pg/mL (ref 211–946)
WBC: 5.1 10*3/uL (ref 3.4–10.8)

## 2013-06-06 LAB — CMP14+EGFR
A/G RATIO: 2.1 (ref 1.1–2.5)
ALBUMIN: 4.1 g/dL (ref 3.5–5.5)
ALT: 23 IU/L (ref 0–32)
AST: 21 IU/L (ref 0–40)
Alkaline Phosphatase: 95 IU/L (ref 39–117)
BILIRUBIN TOTAL: 0.7 mg/dL (ref 0.0–1.2)
BUN / CREAT RATIO: 20 (ref 9–23)
BUN: 18 mg/dL (ref 6–24)
CO2: 27 mmol/L (ref 18–29)
Calcium: 9 mg/dL (ref 8.7–10.2)
Chloride: 101 mmol/L (ref 97–108)
Creatinine, Ser: 0.9 mg/dL (ref 0.57–1.00)
GFR, EST AFRICAN AMERICAN: 86 mL/min/{1.73_m2} (ref >59)
GFR, EST NON AFRICAN AMERICAN: 75 mL/min/{1.73_m2} (ref >59)
GLUCOSE: 70 mg/dL (ref 65–99)
Globulin, Total: 2 g/dL (ref 1.5–4.5)
Potassium: 3.4 mmol/L — ABNORMAL LOW (ref 3.5–5.2)
Sodium: 143 mmol/L (ref 134–144)
Total Protein: 6.1 g/dL (ref 6.0–8.5)

## 2013-06-22 ENCOUNTER — Ambulatory Visit (INDEPENDENT_AMBULATORY_CARE_PROVIDER_SITE_OTHER): Payer: Medicaid Other | Admitting: Gastroenterology

## 2013-06-22 ENCOUNTER — Other Ambulatory Visit (INDEPENDENT_AMBULATORY_CARE_PROVIDER_SITE_OTHER): Payer: Medicaid Other

## 2013-06-22 ENCOUNTER — Encounter: Payer: Self-pay | Admitting: Gastroenterology

## 2013-06-22 VITALS — BP 110/66 | HR 60 | Ht 61.75 in | Wt 207.0 lb

## 2013-06-22 DIAGNOSIS — K921 Melena: Secondary | ICD-10-CM

## 2013-06-22 DIAGNOSIS — R195 Other fecal abnormalities: Secondary | ICD-10-CM

## 2013-06-22 LAB — CBC WITH DIFFERENTIAL/PLATELET
BASOS PCT: 0.5 % (ref 0.0–3.0)
Basophils Absolute: 0 10*3/uL (ref 0.0–0.1)
Eosinophils Absolute: 0.2 10*3/uL (ref 0.0–0.7)
Eosinophils Relative: 5.8 % — ABNORMAL HIGH (ref 0.0–5.0)
HEMATOCRIT: 36.2 % (ref 36.0–46.0)
Hemoglobin: 12.2 g/dL (ref 12.0–15.0)
Lymphocytes Relative: 15.4 % (ref 12.0–46.0)
Lymphs Abs: 0.6 10*3/uL — ABNORMAL LOW (ref 0.7–4.0)
MCHC: 33.8 g/dL (ref 30.0–36.0)
MCV: 93.1 fl (ref 78.0–100.0)
MONO ABS: 0.3 10*3/uL (ref 0.1–1.0)
Monocytes Relative: 7.7 % (ref 3.0–12.0)
NEUTROS ABS: 2.6 10*3/uL (ref 1.4–7.7)
NEUTROS PCT: 70.6 % (ref 43.0–77.0)
Platelets: 170 10*3/uL (ref 150.0–400.0)
RBC: 3.89 Mil/uL (ref 3.87–5.11)
RDW: 13.1 % (ref 11.5–14.6)
WBC: 3.7 10*3/uL — ABNORMAL LOW (ref 4.5–10.5)

## 2013-06-22 MED ORDER — OMEPRAZOLE 40 MG PO CPDR: 40.0000 mg | DELAYED_RELEASE_CAPSULE | Freq: Two times a day (BID) | ORAL | Status: DC

## 2013-06-22 NOTE — Progress Notes (Signed)
Agree with assessment and plans as outlined 

## 2013-06-22 NOTE — Progress Notes (Signed)
06/22/2013 Angela Michael [id number] July 08, 1960   HISTORY OF PRESENT ILLNESS:  Patient is a pleasant 53 year old female who is knew to our practice and was referred here by her oncologist for complaints of black stools.  She is being treated for lymphoma, which was diagnosed last year.  She says that she has been having black stools since her last treatment on 2/10.  She says that her treatments have steroids in them.  Has been having 3 black stools each day, which is more than normal for her except she only had one yesterday and has not moved her bowels yet today.  She had a Hgb performed on 2/20, which was normal at 12.8 grams and iron studies were normal as well.  She is on iron supplements, Integra, for approximately the past year but has never noticed black stools with those in the past.  Has some nausea, but associates that with her treatments.  She takes a ASA 81 mg daily and occasional ibuprofen (a couple of times per week).  She is s/p gastric bypass surgery several years ago at [facility].  She had a colonoscopy in 01/2011 at [facility] at which time she was only found to have diverticulosis.  She denies any dizziness or light-headedness.   Past Medical History  Diagnosis Date  . History of syncope   . Depression   . PVC's (premature ventricular contractions)   . Chronic pain   . Chronic back pain   . Chronic neck pain   . Peripheral edema   . Hyperlipidemia   . Anemia   . Seizures   . Neuropathy   . SVT (supraventricular tachycardia)   . Diabetes mellitus without complication   . Lymphoma    Past Surgical History  Procedure Laterality Date  . Gastric bypass      Says she had lost 345 lbs  . Venous thrombectomy      Blood clot resection from right arm  . Lymph node dissection Left     Resected  . Skin surgery      Removal of excess skin  . Vena cava filter placement    . Lymph node removed from stomach    . Cholecystectomy    . Cataract extraction Right     reports that she has  never smoked. She has never used smokeless tobacco. She reports that she drinks alcohol. She reports that she does not use illicit drugs. family history includes Bone cancer in her maternal grandfather; Diabetes in her father and paternal uncle; Heart attack in her brother; Heart failure in her brother; Kidney cancer in her father; Liver cancer in her father; Tuberculosis in her maternal grandmother. There is no history of Coronary artery disease, Sudden death, or Cardiomyopathy. Allergies  Allergen Reactions  . Advicor [Niacin-Lovastatin Er] Rash      Outpatient Encounter Prescriptions as of 06/22/2013  Medication Sig  . aspirin EC 81 MG tablet Take 81 mg by mouth daily.  Marland Kitchen atorvastatin (LIPITOR) 40 MG tablet TAKE 1 TABLET BY MOUTH AT BEDTIME  . Cholecalciferol (VITAMIN D3) 400 UNITS tablet Take 400 Units by mouth daily.    . Cyanocobalamin (VITAMIN B-12 IJ) Inject 1 mL as directed every 30 (thirty) days.  . Fe Fum-FePoly-Vit C-Vit B3 (INTEGRA) 62.5-62.5-40-3 MG CAPS TAKE ONE CAPSULE BY MOUTH EVERY DAY  . fentaNYL (DURAGESIC - DOSED MCG/HR) 75 MCG/HR Place 1 patch (75 mcg total) onto the skin every 3 (three) days.  Marland Kitchen FLUoxetine (PROZAC) 40 MG capsule TAKE 2  CAPSULES BY MOUTH EVERY DAY  . fluticasone (FLONASE) 50 MCG/ACT nasal spray Place 2 sprays into the nose as needed. For allergies  . furosemide (LASIX) 40 MG tablet TAKE 1 TABLET (40 MG TOTAL) BY MOUTH DAILY.  Marland Kitchen gabapentin (NEURONTIN) 300 MG capsule TAKE 1 CAPSULE BY MOUTH 2 TIMES DAILY  . levETIRAcetam (KEPPRA) 500 MG tablet TAKE 2 TABLETS BY MOUTH 2 TIMES DAILY  . loratadine (CLARITIN) 10 MG tablet Take 10 mg by mouth as needed. For allergies  . Multiple Vitamin (MULTIVITAMIN WITH MINERALS) TABS Take 1 tablet by mouth daily.  . nitroGLYCERIN (NITROSTAT) 0.4 MG SL tablet Place 1 tablet (0.4 mg total) under the tongue every 5 (five) minutes as needed for chest pain.  . Nutritional Supplements (ESTROVEN PMS) TABS Take 1 tablet by mouth  daily.   . potassium chloride SA (K-DUR,KLOR-CON) 20 MEQ tablet Take 20 mEq by mouth 2 (two) times daily. For 15 days     REVIEW OF SYSTEMS  : All other systems reviewed and negative except where noted in the History of Present Illness.   PHYSICAL EXAM: BP 110/66  Pulse 60  Ht 5' 1.75" (1.568 m)  Wt 207 lb (93.895 kg)  BMI 38.19 kg/m2 General: Well developed white female in no acute distress Head: Normocephalic and atraumatic Eyes:  Sclerae anicteric, conjunctiva pink. Ears: Normal auditory acuity.  Lungs: Clear throughout to auscultation Heart: Regular rate and rhythm Abdomen: Soft, obese, non-distended.  Hyperactive bowel sounds.  Non-tender. Rectal:  No external hemorrhoids or lesions noted.  DRE did not reveal any masses.  Light brown colored stool on exam glove, but was heme positive. Musculoskeletal: Symmetrical with no gross deformities  Skin: No lesions on visible extremities Extremities: No edema  Neurological: Alert oriented x 4, grossly non-focal. Psychological:  Alert and cooperative. Normal mood and affect  ASSESSMENT AND PLAN: -Black stools since 2/10.  Hgb was normal on 2/20.  Rectal exam did not reveal melena, but was heme positive.  Colonoscopy reportedly within past couple of years at [facility] so we will try to find those records.  Is on treatment for lymphoma and her treatments contain steroids.  Also takes daily ASA 81 mg and occasional Ibuprofen.  Will place her on omeprazole 40 mg BID for now to cover for ulcer disease, etc.  Will recheck Hgb today.  Schedule for EGD next week with Dr. Henrene Pastor.  The risks, benefits, and alternatives were discussed with the patient and she consents to proceed.

## 2013-06-22 NOTE — Patient Instructions (Signed)
You have been scheduled for an endoscopy with propofol. Please follow written instructions given to you at your visit today. If you use inhalers (even only as needed), please bring them with you on the day of your procedure. Your physician has requested that you go to www.startemmi.com and enter the access code given to you at your visit today. This web site gives a general overview about your procedure. However, you should still follow specific instructions given to you by our office regarding your preparation for the procedure.  We have sent the following medications to your pharmacy for you to pick up at your convenience: Omeprazole 40 mg, please take one capsule by mouth twice daily   Your physician has requested that you go to the basement for the following lab work before leaving today: CBC

## 2013-06-29 ENCOUNTER — Encounter: Payer: Self-pay | Admitting: Internal Medicine

## 2013-06-29 ENCOUNTER — Ambulatory Visit (AMBULATORY_SURGERY_CENTER): Payer: Medicaid Other | Admitting: Internal Medicine

## 2013-06-29 VITALS — BP 130/71 | HR 43 | Temp 96.1°F | Resp 13 | Ht 61.75 in | Wt 207.0 lb

## 2013-06-29 DIAGNOSIS — R195 Other fecal abnormalities: Secondary | ICD-10-CM

## 2013-06-29 MED ORDER — SODIUM CHLORIDE 0.9 % IV SOLN: 500.0000 mL | INTRAVENOUS | Status: DC

## 2013-06-29 NOTE — Progress Notes (Signed)
Report to pacu rn, vss, bbs=clear 

## 2013-06-29 NOTE — Op Note (Signed)
[facility]  [address] [place], [id number]   ENDOSCOPY PROCEDURE REPORT  PATIENT: Angela Michael, Angela Michael  MR#: [id number] BIRTHDATE: [birth year] , 52  yrs. old GENDER: Female ENDOSCOPIST: Eustace Quail, MD REFERRED BY:  Breck Coons, N.P. PROCEDURE DATE:  06/29/2013 PROCEDURE:  EGD, diagnostic ASA CLASS:     Class III INDICATIONS: question of Melena.   Heme positive stool.  Prior Roux-en-Y gastric bypass surgery MEDICATIONS: MAC sedation, administered by CRNA and propofol (Diprivan) 120mg  IV TOPICAL ANESTHETIC: Cetacaine Spray  DESCRIPTION OF PROCEDURE: After the risks benefits and alternatives of the procedure were thoroughly explained, informed consent was obtained.  The [id number] endoscope was introduced through the mouth and advanced to the mid jejunum. Without limitations.  The instrument was slowly withdrawn as the mucosa was fully examined.      EXAM:The esophagus was normal.  The stomach revealed prior gastric bypass surgery with normal size gastric remnant, patent gastroenteric anastomosis, and normal jejunum for 40 cm.  The mucosa was intact throughout.  Retroflexion was not performed. The scope was then withdrawn from the patient and the procedure completed.  COMPLICATIONS: There were no complications. ENDOSCOPIC IMPRESSION: 1. Normal postop exam 2. Etiology of dark stools unclear. May have had mucosal abnormality in the excluded portion of her anatomy. Hemoglobins have remained normal. Stools now looking normal..  RECOMMENDATIONS: 1.  Continue omeprazole 40 mg daily indefinitely to protect the upper gastrointestinal mucosa 2.  Avoid NSAIDS, such as ibuprofen. If aspirin necessary, okay at baby aspirin dosage 3. Return to the care of your primary providers  REPEAT EXAM:  eSigned:  Eustace Quail, MD 06/29/2013 4:15 PM   VE:LFYB Harmon Pier, NP and The Patient

## 2013-06-29 NOTE — Patient Instructions (Signed)
YOU HAD AN ENDOSCOPIC PROCEDURE TODAY AT THE [facility]: Refer to the procedure report that was given to you for any specific questions about what was found during the examination.  If the procedure report does not answer your questions, please call your gastroenterologist to clarify.  If you requested that your care partner not be given the details of your procedure findings, then the procedure report has been included in a sealed envelope for you to review at your convenience later.  YOU SHOULD EXPECT: Some feelings of bloating in the abdomen. Passage of more gas than usual.  Walking can help get rid of the air that was put into your GI tract during the procedure and reduce the bloating. If you had a lower endoscopy (such as a colonoscopy or flexible sigmoidoscopy) you may notice spotting of blood in your stool or on the toilet paper. If you underwent a bowel prep for your procedure, then you may not have a normal bowel movement for a few days.  DIET: Your first meal following the procedure should be a light meal and then it is ok to progress to your normal diet.  A half-sandwich or bowl of soup is an example of a good first meal.  Heavy or fried foods are harder to digest and may make you feel nauseous or bloated.  Likewise meals heavy in dairy and vegetables can cause extra gas to form and this can also increase the bloating.  Drink plenty of fluids but you should avoid alcoholic beverages for 24 hours.  ACTIVITY: Your care partner should take you home directly after the procedure.  You should plan to take it easy, moving slowly for the rest of the day.  You can resume normal activity the day after the procedure however you should NOT DRIVE or use heavy machinery for 24 hours (because of the sedation medicines used during the test).    SYMPTOMS TO REPORT IMMEDIATELY: A gastroenterologist can be reached at any hour.  During normal business hours, 8:30 AM to 5:00 PM Monday through Friday,  call [phone].  After hours and on weekends, please call the GI answering service at [phone] who will take a message and have the physician on call contact you.  Following upper endoscopy (EGD)  Vomiting of blood or coffee ground material  New chest pain or pain under the shoulder blades  Painful or persistently difficult swallowing  New shortness of breath  Fever of 100F or higher  Black, tarry-looking stools  FOLLOW UP: If any biopsies were taken you will be contacted by phone or by letter within the next 1-3 weeks.  Call your gastroenterologist if you have not heard about the biopsies in 3 weeks.  Our staff will call the home number listed on your records the next business day following your procedure to check on you and address any questions or concerns that you may have at that time regarding the information given to you following your procedure. This is a courtesy call and so if there is no answer at the home number and we have not heard from you through the emergency physician on call, we will assume that you have returned to your regular daily activities without incident.  SIGNATURES/CONFIDENTIALITY: You and/or your care partner have signed paperwork which will be entered into your electronic medical record.  These signatures attest to the fact that that the information above on your After Visit Summary has been reviewed and is understood.  Full responsibility of   the confidentiality of this discharge information lies with you and/or your care-partner. 

## 2013-07-11 ENCOUNTER — Telehealth: Payer: Self-pay | Admitting: Nurse Practitioner

## 2013-07-11 MED ORDER — FENTANYL 75 MCG/HR TD PT72: 75.0000 ug | MEDICATED_PATCH | TRANSDERMAL | Status: DC

## 2013-07-11 NOTE — Telephone Encounter (Signed)
Please fill her fenatnyl patch- she has neuropathy in right arm. She has been on patch for years.

## 2013-07-14 ENCOUNTER — Encounter: Payer: Self-pay | Admitting: Cardiovascular Disease

## 2013-07-14 ENCOUNTER — Ambulatory Visit (INDEPENDENT_AMBULATORY_CARE_PROVIDER_SITE_OTHER): Payer: Medicaid Other | Admitting: Cardiovascular Disease

## 2013-07-14 VITALS — BP 116/80 | HR 92 | Ht 61.75 in | Wt 207.0 lb

## 2013-07-14 DIAGNOSIS — I471 Supraventricular tachycardia: Secondary | ICD-10-CM

## 2013-07-14 DIAGNOSIS — E785 Hyperlipidemia, unspecified: Secondary | ICD-10-CM

## 2013-07-14 DIAGNOSIS — I498 Other specified cardiac arrhythmias: Secondary | ICD-10-CM

## 2013-07-14 DIAGNOSIS — R079 Chest pain, unspecified: Secondary | ICD-10-CM

## 2013-07-14 NOTE — Patient Instructions (Signed)
Your physician recommends that you schedule a follow-up appointment in: 1 year with Dr. Bronson Ing. You should receive a letter in the mail in 10 months. If you do not receive this letter by February 2016 call our office to schedule this appointment.   Your physician recommends that you continue on your current medications as directed. Please refer to the Current Medication list given to you today.

## 2013-07-14 NOTE — Progress Notes (Signed)
Patient ID: Angela Michael, female   DOB: [birth year], 53 y.o.   MRN: [id number]      SUBJECTIVE: The patient is a 53 year old woman who presents for followup of SVT. She has a history of chest pain, lymphoma, venous insufficiency, and gastric bypass surgery. She has nonobstructive coronary artery disease as per coronary angiography performed in November 2010 at [facility], following a false positive stress test.  The patient denies any symptoms of chest pain, palpitations, shortness of breath, lightheadedness, dizziness, leg swelling, orthopnea, PND, and syncope. She continues to receive treatment for lymphoma once every 3 months.    Allergies  Allergen Reactions  . Advicor [Niacin-Lovastatin Er] Rash    Current Outpatient Prescriptions  Medication Sig Dispense Refill  . Ascorbic Acid (VITAMIN C) 1000 MG tablet Take by mouth.      Marland Kitchen aspirin EC 81 MG tablet Take 81 mg by mouth daily.      Marland Kitchen atorvastatin (LIPITOR) 40 MG tablet TAKE 1 TABLET BY MOUTH AT BEDTIME  30 tablet  4  . Cholecalciferol (VITAMIN D-1000 MAX ST) 1000 UNITS tablet Take 1,000 Units by mouth.      . cyanocobalamin (,VITAMIN B-12,) 1000 MCG/ML injection Inject 1,000 mcg into the muscle.      Marland Kitchen Fe Fum-FePoly-Vit C-Vit B3 (INTEGRA) 62.5-62.5-40-3 MG CAPS TAKE ONE CAPSULE BY MOUTH EVERY DAY  30 capsule  5  . fentaNYL (DURAGESIC - DOSED MCG/HR) 75 MCG/HR Place 1 patch (75 mcg total) onto the skin every 3 (three) days.  10 patch  0  . FLUoxetine (PROZAC) 40 MG capsule TAKE 2 CAPSULES BY MOUTH EVERY DAY  60 capsule  2  . fluticasone (FLONASE) 50 MCG/ACT nasal spray Place 2 sprays into the nose as needed. For allergies  16 g  5  . furosemide (LASIX) 40 MG tablet TAKE 1 TABLET (40 MG TOTAL) BY MOUTH DAILY.  30 tablet  5  . gabapentin (NEURONTIN) 300 MG capsule TAKE 1 CAPSULE BY MOUTH 2 TIMES DAILY  60 capsule  5  . hydrOXYzine (ATARAX/VISTARIL) 10 MG tablet Take 10 mg by mouth.      . levETIRAcetam (KEPPRA) 500 MG  tablet TAKE 2 TABLETS BY MOUTH 2 TIMES DAILY  120 tablet  5  . loratadine (CLARITIN) 10 MG tablet Take 10 mg by mouth as needed. For allergies      . Multiple Vitamin (MULTIVITAMIN WITH MINERALS) TABS Take 1 tablet by mouth daily.      . nitroGLYCERIN (NITROSTAT) 0.4 MG SL tablet Place 1 tablet (0.4 mg total) under the tongue every 5 (five) minutes as needed for chest pain.  25 tablet  3  . Nutritional Supplements (ESTROVEN PMS) TABS Take 1 tablet by mouth daily.       Marland Kitchen omeprazole (PRILOSEC) 40 MG capsule Take 1 capsule (40 mg total) by mouth 2 (two) times daily.  60 capsule  3  . ondansetron (ZOFRAN-ODT) 8 MG disintegrating tablet Take 8 mg by mouth.      . potassium chloride SA (K-DUR,KLOR-CON) 20 MEQ tablet Take 20 mEq by mouth 2 (two) times daily. For 15 days      . tetrahydrozoline 0.05 % ophthalmic solution Apply 1 drop to eye.       No current facility-administered medications for this visit.    Past Medical History  Diagnosis Date  . History of syncope   . Depression   . PVC's (premature ventricular contractions)   . Chronic pain   . Chronic back pain   .  Chronic neck pain   . Peripheral edema   . Hyperlipidemia   . Anemia   . Seizures   . Neuropathy   . SVT (supraventricular tachycardia)   . Lymphoma   . Diabetes mellitus without complication 5/46/5681    patient denies being diabetic, no meds    Past Surgical History  Procedure Laterality Date  . Gastric bypass      Says she had lost 345 lbs  . Venous thrombectomy      Blood clot resection from right arm  . Lymph node dissection Left     Resected  . Skin surgery      Removal of excess skin  . Vena cava filter placement    . Lymph node removed from stomach    . Cholecystectomy    . Cataract extraction Right     History   Social History  . Marital Status: Married    Spouse Name: N/A    Number of Children: 2  . Years of Education: N/A   Occupational History  . Currently does not work    Social  History Main Topics  . Smoking status: Never Smoker   . Smokeless tobacco: Never Used  . Alcohol Use: Yes     Comment: Occasionally  . Drug Use: No  . Sexual Activity: Not on file   Other Topics Concern  . Not on file   Social History Narrative   Married with 2 children     Filed Vitals:   07/14/13 0936  BP: 116/80  Pulse: 92  Height: 5' 1.75" (1.568 m)  Weight: 207 lb (93.895 kg)    PHYSICAL EXAM General: NAD Neck: No JVD, no thyromegaly. Lungs: Clear to auscultation bilaterally with normal respiratory effort. CV: Nondisplaced PMI.  Regular rate and rhythm, normal S1/S2, no S3/S4, no murmur. No pretibial or periankle edema.  No carotid bruit.  Normal pedal pulses.  Abdomen: Soft, nontender, no hepatosplenomegaly, no distention.  Neurologic: Alert and oriented x 3.  Psych: Normal affect. Extremities: No clubbing or cyanosis.   ECG: reviewed and available in electronic records.      ASSESSMENT AND PLAN: 1. Chest pain: no further recurrences. Will continue to monitor for symptoms. Aggressive risk factor modification will continue to be pursued. She is taking ASA 81 mg daily and Lipitor 40 mg daily.  2. SVT: No further recurrences. No medication adjustments at this time.  3. Hyperlipidemia: on Lipitor. Managed by primary provider.  Dispo: f/u 1 year.  Kate Sable, M.D., F.A.C.C.

## 2013-08-02 ENCOUNTER — Other Ambulatory Visit: Payer: Self-pay

## 2013-08-02 MED ORDER — FUROSEMIDE 40 MG PO TABS: 40.0000 mg | ORAL_TABLET | Freq: Every day | ORAL | Status: DC

## 2013-08-11 ENCOUNTER — Telehealth: Payer: Self-pay | Admitting: Nurse Practitioner

## 2013-08-11 MED ORDER — FENTANYL 75 MCG/HR TD PT72: 75.0000 ug | MEDICATED_PATCH | TRANSDERMAL | Status: DC

## 2013-08-11 NOTE — Telephone Encounter (Signed)
Left message that prescription is up front

## 2013-08-11 NOTE — Telephone Encounter (Signed)
rx ready for pickup 

## 2013-08-18 ENCOUNTER — Telehealth: Payer: Self-pay | Admitting: Gastroenterology

## 2013-08-18 NOTE — Telephone Encounter (Signed)
I called [facility], and asked why we got a message that the prescription has to be done by an MD , not a PA.  I told the pharmacist our extenders can order any medications that the MD's can order.  The pharmacist agreed and said it could be because it was Medicaid.  I told the pharmacist to do the Omeprazole under Dr. Scarlette Shorts.

## 2013-08-22 ENCOUNTER — Encounter: Payer: Self-pay | Admitting: Vascular Surgery

## 2013-08-23 ENCOUNTER — Ambulatory Visit (INDEPENDENT_AMBULATORY_CARE_PROVIDER_SITE_OTHER): Payer: Medicaid Other | Admitting: Vascular Surgery

## 2013-08-23 ENCOUNTER — Encounter: Payer: Self-pay | Admitting: Vascular Surgery

## 2013-08-23 VITALS — BP 111/77 | HR 44 | Resp 16 | Ht 61.5 in | Wt 210.6 lb

## 2013-08-23 DIAGNOSIS — I83893 Varicose veins of bilateral lower extremities with other complications: Secondary | ICD-10-CM

## 2013-08-23 NOTE — Progress Notes (Signed)
Problems with Activities of Daily Living Secondary to Leg Pain  1. Angela Michael states she has difficulty sleeping due to leg pain.   2. Angela Michael states that activities that require prolonged standing (cooking, cleaning, shopping) are difficult for her due to leg pain.       Failure of  Conservative Therapy:  1. Worn 20-30 mm Hg thigh high compression hose >3 months with no relief of symptoms.  2. Frequently elevates legs-no relief of symptoms  3. Taken Ibuprofen 600 Mg TID with no relief of symptoms.  The patient is a 14 discussion regarding her venous hypertension. She reports no pain in her left leg. She does have continued pain in her right leg despite conservative treatment. She is unusual history of injury to apparently her brachial nerves and her right arm from IV attempt at. This may have been from a PICC line but did not have a history of this. She reports she has been told that she does have nerve damage in her right leg associated with this peripheral injury to her right arm. This would be unusual and would not expect any improvement in this type of symptoms for many vein treatment. She does have a history of prior gastric bypass and has lost approximately 300 pounds according to her history.  Physical exam she does have a varicosities in her right calf. I did reimage this area with SonoSite ultrasound showed reflux in her small saphenous vein extending into these varicosities.  Impression and plan symptomatic small saphenous vein reflux. She has failed conservative treatment. I have recommended laser ablation of her small saphenous vein and stab phlebectomy for symptom relief. I did explain to light risk of DVT associated with this. He wished to reverse proceed as soon as possible

## 2013-08-29 ENCOUNTER — Other Ambulatory Visit: Payer: Self-pay | Admitting: *Deleted

## 2013-08-29 ENCOUNTER — Telehealth: Payer: Self-pay | Admitting: Vascular Surgery

## 2013-08-29 DIAGNOSIS — I83893 Varicose veins of bilateral lower extremities with other complications: Secondary | ICD-10-CM

## 2013-08-29 NOTE — Telephone Encounter (Addendum)
Message copied by Gena Fray on Mon Aug 29, 2013  4:51 PM ------      Message from: Norberto Sorenson D      Created: Mon Aug 29, 2013  4:17 PM      Regarding: scheduling       Please schedule Mrs. Kimble for post LA duplex (right leg, order in EPIC) and VV FU for 09-15-2013 with Dr. Donnetta Hutching.  Thanks! ------  08/29/13: done

## 2013-08-31 ENCOUNTER — Ambulatory Visit (INDEPENDENT_AMBULATORY_CARE_PROVIDER_SITE_OTHER): Payer: Medicaid Other | Admitting: Nurse Practitioner

## 2013-08-31 ENCOUNTER — Encounter: Payer: Self-pay | Admitting: Nurse Practitioner

## 2013-08-31 VITALS — BP 127/79 | HR 53 | Temp 98.1°F | Ht 61.75 in | Wt 204.0 lb

## 2013-08-31 DIAGNOSIS — E785 Hyperlipidemia, unspecified: Secondary | ICD-10-CM

## 2013-08-31 DIAGNOSIS — G5690 Unspecified mononeuropathy of unspecified upper limb: Secondary | ICD-10-CM

## 2013-08-31 DIAGNOSIS — F329 Major depressive disorder, single episode, unspecified: Secondary | ICD-10-CM

## 2013-08-31 DIAGNOSIS — F3289 Other specified depressive episodes: Secondary | ICD-10-CM

## 2013-08-31 DIAGNOSIS — I83893 Varicose veins of bilateral lower extremities with other complications: Secondary | ICD-10-CM

## 2013-08-31 DIAGNOSIS — R600 Localized edema: Secondary | ICD-10-CM

## 2013-08-31 DIAGNOSIS — R609 Edema, unspecified: Secondary | ICD-10-CM

## 2013-08-31 DIAGNOSIS — R300 Dysuria: Secondary | ICD-10-CM

## 2013-08-31 DIAGNOSIS — E876 Hypokalemia: Secondary | ICD-10-CM

## 2013-08-31 DIAGNOSIS — K219 Gastro-esophageal reflux disease without esophagitis: Secondary | ICD-10-CM

## 2013-08-31 DIAGNOSIS — D649 Anemia, unspecified: Secondary | ICD-10-CM

## 2013-08-31 LAB — POCT URINALYSIS DIPSTICK
BILIRUBIN UA: NEGATIVE
Blood, UA: NEGATIVE
Glucose, UA: NEGATIVE
Ketones, UA: NEGATIVE
LEUKOCYTES UA: NEGATIVE
Nitrite, UA: POSITIVE
PROTEIN UA: NEGATIVE
SPEC GRAV UA: 1.025
Urobilinogen, UA: NEGATIVE
pH, UA: 5

## 2013-08-31 LAB — POCT UA - MICROSCOPIC ONLY
Casts, Ur, LPF, POC: NEGATIVE
Crystals, Ur, HPF, POC: NEGATIVE
RBC, urine, microscopic: NEGATIVE
Yeast, UA: NEGATIVE

## 2013-08-31 MED ORDER — OMEPRAZOLE 40 MG PO CPDR: 40.0000 mg | DELAYED_RELEASE_CAPSULE | Freq: Two times a day (BID) | ORAL | Status: DC

## 2013-08-31 MED ORDER — GABAPENTIN 300 MG PO CAPS: ORAL_CAPSULE | ORAL | Status: DC

## 2013-08-31 NOTE — Patient Instructions (Signed)

## 2013-08-31 NOTE — Progress Notes (Signed)
Subjective:    Patient ID: Angela Michael, female    DOB: 28-Apr-1960, 53 y.o.   MRN: [id number]  Patient here today for follow up of chronic medical problems.     Hyperlipidemia This is a chronic problem. The current episode started more than 1 year ago. The problem is uncontrolled. Recent lipid tests were reviewed and are high. Pertinent negatives include no chest pain. Current antihyperlipidemic treatment includes statins. The current treatment provides moderate improvement of lipids. Compliance problems include adherence to diet and adherence to exercise.  Risk factors for coronary artery disease include obesity and post-menopausal.  Seizures Keppra working well no seizure in several years. Peripheral edema Lasix working well to keep edema under control. Hypokalemia K-Dur working well. 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   HENT: Negative.   Respiratory: Negative for cough, choking and chest tightness.   Cardiovascular: Negative for chest pain.  Gastrointestinal: Negative.   Endocrine: Negative.   Genitourinary: Negative.   Musculoskeletal: Negative.   Allergic/Immunologic: Negative.   Neurological: Negative.   Psychiatric/Behavioral: Negative.   All other systems reviewed and are negative.      Objective:   Physical Exam  Constitutional: She is oriented to person, place, and time. She appears well-developed and well-nourished.  HENT:  Nose: Nose normal.  Mouth/Throat: Oropharynx is clear and moist.  Eyes: EOM are normal.  Neck: Trachea normal, normal range of motion and full passive range of motion without pain. Neck supple. No JVD present.  Carotid bruit is not present. No thyromegaly present.  Cardiovascular: Normal rate, regular rhythm, normal heart sounds and intact distal pulses.  Exam reveals no gallop and no friction rub.   No murmur heard. Rope like varicose veins right calf  Pulmonary/Chest: Effort normal and breath sounds normal.  Abdominal: Soft. Bowel sounds are normal. She exhibits no distension and no mass. There is no tenderness.  Musculoskeletal: Normal range of motion.  Lymphadenopathy:    She has no cervical adenopathy.  Neurological: She is alert and oriented to person, place, and time. She has normal reflexes.  Skin: Skin is warm and dry.  Psychiatric: She has a normal mood and affect. Her behavior is normal. Judgment and thought content normal.    BP 127/79  Pulse 53  Temp(Src) 98.1 F (36.7 C) (Oral)  Ht 5' 1.75" (1.568 m)  Wt 204 lb (92.534 kg)  BMI 37.64 kg/m2         Assessment & Plan:    1. Dysuria   2. Varicose veins of lower extremities with other complications   3. Peripheral edema   4. Neuropathy of upper extremity   5. Morbid obesity   6. Hypokalemia   7. Hyperlipidemia with target LDL less than 100   8. DEPRESSION   9. Anemia   10. GERD (gastroesophageal reflux disease)    Orders Placed This Encounter  Procedures  . CMP14+EGFR  . NMR, lipoprofile  . POCT urinalysis dipstick  . POCT UA - Microscopic Only   Meds ordered this encounter  Medications  . omeprazole (PRILOSEC) 40 MG capsule    Sig: Take 1 capsule (40 mg total) by mouth 2 (two) times daily.  Dispense:  60 capsule    Refill:  3    Order Specific Question:  Supervising Provider    Answer:  Chipper Herb [[id number]]  . gabapentin (NEURONTIN) 300 MG capsule    Sig: TAKE 2 CAPSULE BY MOUTH 2 TIMES DAILY    Dispense:  120 capsule    Refill:  5    Order Specific Question:  Supervising Provider    Answer:  Chipper Herb [[id number]]   Keep me posted on [place] pending Health maintenance reviewed Diet and  exercise encouraged Continue all meds Follow up  In 3 month   [place], FNP

## 2013-09-01 LAB — NMR, LIPOPROFILE
Cholesterol: 141 mg/dL (ref 100–199)
HDL Cholesterol by NMR: 59 mg/dL (ref >39)
HDL PARTICLE NUMBER: 32.7 umol/L (ref >=30.5)
LDL Particle Number: 1006 nmol/L — ABNORMAL HIGH (ref <1000)
LDL SIZE: 20.6 nm (ref >20.5)
LDLC SERPL CALC-MCNC: 63 mg/dL (ref 0–99)
LP-IR SCORE: 47 — AB (ref <=45)
Small LDL Particle Number: 448 nmol/L (ref <=527)
Triglycerides by NMR: 97 mg/dL (ref 0–149)

## 2013-09-01 LAB — CMP14+EGFR
A/G RATIO: 2 (ref 1.1–2.5)
ALK PHOS: 96 IU/L (ref 39–117)
ALT: 25 IU/L (ref 0–32)
AST: 23 IU/L (ref 0–40)
Albumin: 4.5 g/dL (ref 3.5–5.5)
BUN / CREAT RATIO: 21 (ref 9–23)
BUN: 21 mg/dL (ref 6–24)
CO2: 25 mmol/L (ref 18–29)
CREATININE: 1.02 mg/dL — AB (ref 0.57–1.00)
Calcium: 9.3 mg/dL (ref 8.7–10.2)
Chloride: 98 mmol/L (ref 97–108)
GFR calc Af Amer: 74 mL/min/{1.73_m2} (ref >59)
GFR, EST NON AFRICAN AMERICAN: 64 mL/min/{1.73_m2} (ref >59)
GLOBULIN, TOTAL: 2.2 g/dL (ref 1.5–4.5)
Glucose: 120 mg/dL — ABNORMAL HIGH (ref 65–99)
POTASSIUM: 4 mmol/L (ref 3.5–5.2)
SODIUM: 138 mmol/L (ref 134–144)
Total Bilirubin: 1.1 mg/dL (ref 0.0–1.2)
Total Protein: 6.7 g/dL (ref 6.0–8.5)

## 2013-09-06 ENCOUNTER — Other Ambulatory Visit: Payer: Self-pay | Admitting: Vascular Surgery

## 2013-09-06 ENCOUNTER — Telehealth: Payer: Self-pay | Admitting: Nurse Practitioner

## 2013-09-06 DIAGNOSIS — I83893 Varicose veins of bilateral lower extremities with other complications: Secondary | ICD-10-CM

## 2013-09-06 DIAGNOSIS — Z48812 Encounter for surgical aftercare following surgery on the circulatory system: Secondary | ICD-10-CM

## 2013-09-07 ENCOUNTER — Encounter: Payer: Self-pay | Admitting: Vascular Surgery

## 2013-09-08 ENCOUNTER — Encounter: Payer: Self-pay | Admitting: Vascular Surgery

## 2013-09-08 ENCOUNTER — Ambulatory Visit (INDEPENDENT_AMBULATORY_CARE_PROVIDER_SITE_OTHER): Payer: Medicaid Other | Admitting: Vascular Surgery

## 2013-09-08 VITALS — BP 108/74 | HR 44 | Resp 16 | Ht 61.5 in | Wt 204.0 lb

## 2013-09-08 DIAGNOSIS — I83893 Varicose veins of bilateral lower extremities with other complications: Secondary | ICD-10-CM

## 2013-09-08 HISTORY — PX: ENDOVENOUS ABLATION SAPHENOUS VEIN W/ LASER: SUR449

## 2013-09-08 NOTE — Progress Notes (Signed)
   Laser Ablation Procedure      Date: 09/08/2013    Angela Michael DOB:June 30, 1960  Consent signed: Yes  Surgeon:T.F. Abdikadir Fohl  Procedure: Laser Ablation: right Small Saphenous Vein  BP 108/74  Pulse 44  Resp 16  Ht 5' 1.5" (1.562 m)  Wt 204 lb (92.534 kg)  BMI 37.93 kg/m2  Start time: 10:55AM   End time: 12:00PM  Tumescent Anesthesia: 400 cc 0.9% NaCl with 50 cc Lidocaine HCL with 1% Epi and 15 cc 8.4% NaHCO3  Local Anesthesia: 4 cc Lidocaine HCL and NaHCO3 (ratio 2:1)  Continuous Mode: 15 Watts Total Energy 1517 Joules Total Time1:41     Stab Phlebectomy: 10-20 Sites: Calf and Ankle right leg  Patient tolerated procedure well: Yes    Description of Procedure:  After marking the course of the saphenous vein and the secondary varicosities in the standing position, the patient was placed on the operating table in the prone position, and the right leg was prepped and draped in sterile fashion. Local anesthetic was administered, and under ultrasound guidance the saphenous vein was accessed with a micro needle and guide wire; then the micro puncture sheath was placed. A guide wire was inserted to the saphenopopliteal junction, followed by a 5 french sheath.  The position of the sheath and then the laser fiber below the junction was confirmed using the ultrasound and visualization of the aiming beam.  Tumescent anesthesia was administered along the course of the saphenous vein using ultrasound guidance. Protective laser glasses were placed on the patient, and the laser was fired at at 15 watt continuous mode.  For a total of 1517 joules.  A steri strip was applied to the puncture site.  The patient was then put into Trendelenburg position.  Local anesthetic was utilized overlying the marked varicosities.  Greater than 10-20 stab wounds were made using the tip of an 11 blade; and using the vein hook,  The phlebectomies were performed using a hemostat to avulse these varicosities.   Adequate hemostasis was achieved, and steri strips were applied to the stab wound.      ABD pads and thigh high compression stockings were applied.  Ace wrap bandages were applied over the phlebectomy sites and at the top of the saphenopopliteal junction.  Blood loss was less than 15 cc.  The patient ambulated out of the operating room having tolerated the procedure well.

## 2013-09-09 ENCOUNTER — Encounter: Payer: Self-pay | Admitting: Vascular Surgery

## 2013-09-13 ENCOUNTER — Telehealth: Payer: Self-pay | Admitting: Nurse Practitioner

## 2013-09-13 MED ORDER — FENTANYL 75 MCG/HR TD PT72: 75.0000 ug | MEDICATED_PATCH | TRANSDERMAL | Status: DC

## 2013-09-13 NOTE — Telephone Encounter (Signed)
rx ready for pickup 

## 2013-09-14 ENCOUNTER — Encounter: Payer: Self-pay | Admitting: Vascular Surgery

## 2013-09-14 ENCOUNTER — Telehealth: Payer: Self-pay | Admitting: *Deleted

## 2013-09-14 NOTE — Telephone Encounter (Signed)
    09/14/2013  Time: 9:23 AM   Patient Name: Angela Michael  Patient of: T.F. Early  Procedure:Laser Ablation right small saphenous vein and stab phlebectomy 10-20 incisions 09-08-2013    Reached patient at home and checked  Her status  Yes    Comments/Actions Taken: Ms. Manner states no problems with right leg pain,swelling, or bleeding/oozing.  Reviewed post procedural instructions with her and reminded her of post laser ablation duplex and VV FU with Dr. Donnetta Hutching on 09-15-2013.      @SIGNATURE @

## 2013-09-15 ENCOUNTER — Ambulatory Visit (HOSPITAL_COMMUNITY)
Admission: RE | Admit: 2013-09-15 | Discharge: 2013-09-15 | Disposition: A | Discharge status: Home / Self Care | Payer: Medicaid Other | Source: Ambulatory Visit | Attending: Vascular Surgery | Admitting: Vascular Surgery

## 2013-09-15 ENCOUNTER — Ambulatory Visit (INDEPENDENT_AMBULATORY_CARE_PROVIDER_SITE_OTHER): Payer: Self-pay | Admitting: Vascular Surgery

## 2013-09-15 ENCOUNTER — Other Ambulatory Visit (HOSPITAL_COMMUNITY): Payer: Self-pay

## 2013-09-15 ENCOUNTER — Encounter: Payer: Self-pay | Admitting: Vascular Surgery

## 2013-09-15 VITALS — BP 118/68 | HR 54 | Resp 18 | Ht 61.5 in | Wt 204.0 lb

## 2013-09-15 DIAGNOSIS — Z48812 Encounter for surgical aftercare following surgery on the circulatory system: Secondary | ICD-10-CM

## 2013-09-15 DIAGNOSIS — I83893 Varicose veins of bilateral lower extremities with other complications: Secondary | ICD-10-CM

## 2013-09-15 NOTE — Progress Notes (Signed)
Patient returns for followup today after recent laser ablation of her lesser saphenous vein. This was done May 28 by Dr. Donnetta Hutching. Patient states she still has some soreness in the leg but overall feels improved.   Review of systems: She denies shortness of breath. She denies chest pain. She denies significant lower extremity swelling.  Physical exam  Filed Vitals:   09/15/13 1002 09/15/13 1005  BP:  118/68  Pulse:  54  Resp: 18 18  Height: 5' 1.5" (1.562 m)   Weight: 204 lb (92.534 kg)    Right lower extremity multiple Steri-Strips in place incisions all healing no hematoma no erythema foot pink warm well perfused  Data: Duplex ultrasound performed today shows no evidence of DVT in the popliteal vein. The right lesser saphenous vein has been successfully ablated.  Assessment: Patient has had successful ablation of the right lesser saphenous vein. She does have reflux in the left lesser saphenous vein but states that leg is not causing problems right now. Plan: She will followup on as-needed basis. She will continue to wear her compression stockings.  Ruta Hinds, MD [facility] Office: [phone] Pager: [phone]

## 2013-10-12 ENCOUNTER — Telehealth: Payer: Self-pay | Admitting: Nurse Practitioner

## 2013-10-12 MED ORDER — FENTANYL 75 MCG/HR TD PT72: 75.0000 ug | MEDICATED_PATCH | TRANSDERMAL | Status: DC

## 2013-10-12 NOTE — Telephone Encounter (Signed)
rx ready for pickup 

## 2013-10-12 NOTE — Telephone Encounter (Signed)
Patient aware.

## 2013-11-06 ENCOUNTER — Other Ambulatory Visit: Payer: Self-pay | Admitting: Nurse Practitioner

## 2013-11-14 ENCOUNTER — Telehealth: Payer: Self-pay | Admitting: Nurse Practitioner

## 2013-11-14 MED ORDER — FENTANYL 75 MCG/HR TD PT72: 75.0000 ug | MEDICATED_PATCH | TRANSDERMAL | Status: DC

## 2013-11-14 NOTE — Telephone Encounter (Signed)
rx ready for pickup 

## 2013-11-15 NOTE — Telephone Encounter (Signed)
Patient aware.

## 2013-11-18 ENCOUNTER — Other Ambulatory Visit: Payer: Self-pay | Admitting: Nurse Practitioner

## 2013-11-18 MED ORDER — HYDROCODONE-ACETAMINOPHEN 10-325 MG PO TABS: 1.0000 | ORAL_TABLET | Freq: Four times a day (QID) | ORAL | Status: DC | PRN

## 2013-11-21 ENCOUNTER — Ambulatory Visit (INDEPENDENT_AMBULATORY_CARE_PROVIDER_SITE_OTHER): Payer: Medicaid Other | Admitting: Nurse Practitioner

## 2013-11-21 ENCOUNTER — Ambulatory Visit (INDEPENDENT_AMBULATORY_CARE_PROVIDER_SITE_OTHER): Payer: Medicaid Other

## 2013-11-21 ENCOUNTER — Encounter: Payer: Self-pay | Admitting: Nurse Practitioner

## 2013-11-21 VITALS — BP 109/69 | HR 50 | Temp 98.9°F | Ht 61.25 in

## 2013-11-21 DIAGNOSIS — M25569 Pain in unspecified knee: Secondary | ICD-10-CM

## 2013-11-21 DIAGNOSIS — M25562 Pain in left knee: Secondary | ICD-10-CM

## 2013-11-21 DIAGNOSIS — M1710 Unilateral primary osteoarthritis, unspecified knee: Secondary | ICD-10-CM

## 2013-11-21 DIAGNOSIS — M1712 Unilateral primary osteoarthritis, left knee: Secondary | ICD-10-CM

## 2013-11-21 NOTE — Progress Notes (Signed)
   Subjective:    Patient ID: Angela Michael, female    DOB: 1960-06-27, 53 y.o.   MRN: [id number]  HPI Patient in c/o left knee pain that has been going on for awhile- she says it is worse when walking- swelling at times.    Review of Systems  Respiratory: Negative.   Cardiovascular: Negative.   Neurological: Negative.   Psychiatric/Behavioral: Negative.   All other systems reviewed and are negative.      Objective:   Physical Exam  Constitutional: She is oriented to person, place, and time. She appears well-developed and well-nourished.  Cardiovascular: Normal rate, regular rhythm and normal heart sounds.   Pulmonary/Chest: Effort normal and breath sounds normal.  Musculoskeletal:  Mild left knee effusion FROM of left knee with pain on full ext and flexion Lateral tenderness on palpation All ligaments intact   Neurological: She is alert and oriented to person, place, and time.  Skin: Skin is warm and dry.  Psychiatric: She has a normal mood and affect. Her behavior is normal. Judgment and thought content normal.    BP 109/69  Pulse 50  Temp(Src) 98.9 F (37.2 C) (Oral)  Ht 5' 1.25" (1.556 m)  Left knee x ray- osteoarthritis with lateral joint space narrowing-Preliminary reading by Ronnald Collum, FNP  [facility]      Assessment & Plan:   1. Left knee pain   2. Primary osteoarthritis of left knee    Orders Placed This Encounter  Procedures  . DG Knee 1-2 Views Left    Standing Status: Future     Number of Occurrences: 1     Standing Expiration Date: 01/21/2015    Order Specific Question:  Reason for Exam (SYMPTOM  OR DIAGNOSIS REQUIRED)    Answer:  left knee pain    Order Specific Question:  Is the patient pregnant?    Answer:  No    Order Specific Question:  Preferred imaging location?    Answer:  Internal   Rest knee as much as possible Continue pain meds as ordered  [place], FNP

## 2013-11-21 NOTE — Patient Instructions (Signed)

## 2013-12-05 ENCOUNTER — Encounter: Payer: Self-pay | Admitting: Nurse Practitioner

## 2013-12-05 ENCOUNTER — Ambulatory Visit (INDEPENDENT_AMBULATORY_CARE_PROVIDER_SITE_OTHER): Payer: Medicaid Other | Admitting: Nurse Practitioner

## 2013-12-05 VITALS — BP 113/70 | HR 59 | Temp 98.7°F | Ht 61.25 in

## 2013-12-05 DIAGNOSIS — E785 Hyperlipidemia, unspecified: Secondary | ICD-10-CM

## 2013-12-05 DIAGNOSIS — E876 Hypokalemia: Secondary | ICD-10-CM

## 2013-12-05 DIAGNOSIS — I498 Other specified cardiac arrhythmias: Secondary | ICD-10-CM

## 2013-12-05 DIAGNOSIS — D509 Iron deficiency anemia, unspecified: Secondary | ICD-10-CM

## 2013-12-05 DIAGNOSIS — I471 Supraventricular tachycardia: Secondary | ICD-10-CM

## 2013-12-05 DIAGNOSIS — D510 Vitamin B12 deficiency anemia due to intrinsic factor deficiency: Secondary | ICD-10-CM

## 2013-12-05 DIAGNOSIS — F329 Major depressive disorder, single episode, unspecified: Secondary | ICD-10-CM

## 2013-12-05 DIAGNOSIS — G5690 Unspecified mononeuropathy of unspecified upper limb: Secondary | ICD-10-CM

## 2013-12-05 DIAGNOSIS — R609 Edema, unspecified: Secondary | ICD-10-CM

## 2013-12-05 DIAGNOSIS — R569 Unspecified convulsions: Secondary | ICD-10-CM | POA: Insufficient documentation

## 2013-12-05 DIAGNOSIS — F3289 Other specified depressive episodes: Secondary | ICD-10-CM

## 2013-12-05 DIAGNOSIS — G5691 Unspecified mononeuropathy of right upper limb: Secondary | ICD-10-CM

## 2013-12-05 DIAGNOSIS — I83893 Varicose veins of bilateral lower extremities with other complications: Secondary | ICD-10-CM

## 2013-12-05 MED ORDER — FENTANYL 75 MCG/HR TD PT72: 75.0000 ug | MEDICATED_PATCH | TRANSDERMAL | Status: DC

## 2013-12-05 MED ORDER — INTEGRA 62.5-62.5-40-3 MG PO CAPS: ORAL_CAPSULE | ORAL | Status: DC

## 2013-12-05 MED ORDER — CYANOCOBALAMIN 1000 MCG/ML IJ SOLN: 1000.0000 ug | INTRAMUSCULAR | Status: DC

## 2013-12-05 MED ORDER — LEVETIRACETAM 500 MG PO TABS: ORAL_TABLET | ORAL | Status: DC

## 2013-12-05 NOTE — Patient Instructions (Signed)

## 2013-12-05 NOTE — Progress Notes (Signed)
Subjective:    Patient ID: Angela Michael, female    DOB: 1960/04/21, 53 y.o.   MRN: [id number]  Patient here today for follow up of chronic medical problems.    Hyperlipidemia This is a chronic problem. The current episode started more than 1 year ago. The problem is uncontrolled. Recent lipid tests were reviewed and are high. Pertinent negatives include no chest pain. Current antihyperlipidemic treatment includes statins. The current treatment provides moderate improvement of lipids. Compliance problems include adherence to diet and adherence to exercise.  Risk factors for coronary artery disease include obesity and post-menopausal.  Seizures Keppra working well no seizure in several years. Peripheral edema Lasix working well to keep edema under control. Hypokalemia K-Dur working well. No C/O lower ext aches Chronic right arm pain and neuropathy Duragesic patches makes pain tolerable- neurotin helps a lot Depression Prozac working well- has occassional bouts of depression but nothing she can't handle anemia Patient had gastric bypass and does nit have intrinsics factor for b12 absorption.     Review of Systems  Constitutional: Negative.   HENT: Negative.   Respiratory: Negative for cough, choking and chest tightness.   Cardiovascular: Negative for chest pain.  Gastrointestinal: Negative.   Endocrine: Negative.   Genitourinary: Negative.   Musculoskeletal: Negative.   Allergic/Immunologic: Negative.   Neurological: Negative.   Psychiatric/Behavioral: Negative.   All other systems reviewed and are negative.      Objective:   Physical Exam  Constitutional: She is oriented to person, place, and time. She appears well-developed and well-nourished.  HENT:  Nose: Nose normal.  Mouth/Throat: Oropharynx is clear and moist.  Eyes: EOM are normal.  Neck: Trachea normal, normal range of motion and full passive range of motion without pain. Neck supple. No JVD present. Carotid  bruit is not present. No thyromegaly present.  Cardiovascular: Normal rate, regular rhythm, normal heart sounds and intact distal pulses.  Exam reveals no gallop and no friction rub.   No murmur heard. Rope like varicose veins right calf  Pulmonary/Chest: Effort normal and breath sounds normal.  Abdominal: Soft. Bowel sounds are normal. She exhibits no distension and no mass. There is no tenderness.  Musculoskeletal: Normal range of motion.  Lymphadenopathy:    She has no cervical adenopathy.  Neurological: She is alert and oriented to person, place, and time. She has normal reflexes.  Skin: Skin is warm and dry.  Psychiatric: She has a normal mood and affect. Her behavior is normal. Judgment and thought content normal.     BP 113/70  Pulse 59  Temp(Src) 98.7 F (37.1 C) (Oral)  Ht 5' 1.25" (1.556 m)      Assessment & Plan:   1. Varicose veins of lower extremities with other complications   2. SVT (supraventricular tachycardia)   3. Peripheral edema   4. Neuropathy of upper extremity, right   5. Morbid obesity   6. Hypokalemia   7. Hyperlipidemia with target LDL less than 100   8. DEPRESSION   9. Vitamin B12 deficiency anemia due to intrinsic factor deficiency   10. Seizures   11. Iron deficiency anemia    Orders Placed This Encounter  Procedures  . CMP14+EGFR  . NMR, lipoprofile  . Anemia Profile B   Meds ordered this encounter  Medications  . cyanocobalamin (,VITAMIN B-12,) 1000 MCG/ML injection    Sig: Inject 1 mL (1,000 mcg total) into the muscle every 30 (thirty) days.    Dispense:  30 mL    Refill:  12    Order Specific Question:  Supervising Provider    Answer:  Chipper Herb [[id number]]  . Fe Fum-FePoly-Vit C-Vit B3 (INTEGRA) 62.5-62.5-40-3 MG CAPS    Sig: TAKE ONE CAPSULE BY MOUTH EVERY DAY    Dispense:  30 capsule    Refill:  5    Order Specific Question:  Supervising Provider    Answer:  Chipper Herb [[id number]]  . fentaNYL (DURAGESIC - DOSED MCG/HR) 75  MCG/HR    Sig: Place 1 patch (75 mcg total) onto the skin every 3 (three) days.    Dispense:  10 patch    Refill:  0    DO not fill till 12/15/13    Order Specific Question:  Supervising Provider    Answer:  Chipper Herb [[id number]]  . levETIRAcetam (KEPPRA) 500 MG tablet    Sig: TAKE 2 TABLETS BY MOUTH 2 TIMES DAILY    Dispense:  120 tablet    Refill:  5    Order Specific Question:  Supervising Provider    Answer:  Chipper Herb [[id number]]    Labs pending Health maintenance reviewed Diet and exercise encouraged Continue all meds Follow up  In 3 months    [place], FNP

## 2013-12-06 LAB — CMP14+EGFR
ALT: 22 IU/L (ref 0–32)
AST: 22 IU/L (ref 0–40)
Albumin/Globulin Ratio: 2 (ref 1.1–2.5)
Albumin: 4 g/dL (ref 3.5–5.5)
Alkaline Phosphatase: 105 IU/L (ref 39–117)
BUN/Creatinine Ratio: 15 (ref 9–23)
BUN: 12 mg/dL (ref 6–24)
CALCIUM: 8.7 mg/dL (ref 8.7–10.2)
CO2: 27 mmol/L (ref 18–29)
Chloride: 98 mmol/L (ref 97–108)
Creatinine, Ser: 0.8 mg/dL (ref 0.57–1.00)
GFR calc Af Amer: 99 mL/min/{1.73_m2} (ref >59)
GFR, EST NON AFRICAN AMERICAN: 86 mL/min/{1.73_m2} (ref >59)
GLUCOSE: 115 mg/dL — AB (ref 65–99)
Globulin, Total: 2 g/dL (ref 1.5–4.5)
POTASSIUM: 3.4 mmol/L — AB (ref 3.5–5.2)
Sodium: 139 mmol/L (ref 134–144)
TOTAL PROTEIN: 6 g/dL (ref 6.0–8.5)
Total Bilirubin: 0.9 mg/dL (ref 0.0–1.2)

## 2013-12-06 LAB — ANEMIA PROFILE B
Basophils Absolute: 0.1 10*3/uL (ref 0.0–0.2)
Basos: 1 %
Eos: 4 %
Eosinophils Absolute: 0.2 10*3/uL (ref 0.0–0.4)
FERRITIN: 111 ng/mL (ref 15–150)
Folate: 12.8 ng/mL (ref >3.0)
HCT: 36.5 % (ref 34.0–46.6)
Hemoglobin: 12.4 g/dL (ref 11.1–15.9)
IMMATURE GRANS (ABS): 0 10*3/uL (ref 0.0–0.1)
IRON SATURATION: 37 % (ref 15–55)
IRON: 110 ug/dL (ref 35–155)
Immature Granulocytes: 0 %
Lymphocytes Absolute: 0.7 10*3/uL (ref 0.7–3.1)
Lymphs: 15 %
MCH: 30.8 pg (ref 26.6–33.0)
MCHC: 34 g/dL (ref 31.5–35.7)
MCV: 91 fL (ref 79–97)
Monocytes Absolute: 0.5 10*3/uL (ref 0.1–0.9)
Monocytes: 10 %
NEUTROS PCT: 70 %
Neutrophils Absolute: 3.4 10*3/uL (ref 1.4–7.0)
PLATELETS: 191 10*3/uL (ref 150–379)
RBC: 4.03 x10E6/uL (ref 3.77–5.28)
RDW: 13.2 % (ref 12.3–15.4)
Retic Ct Pct: 2.3 % (ref 0.6–2.6)
TIBC: 301 ug/dL (ref 250–450)
UIBC: 191 ug/dL (ref 150–375)
VITAMIN B 12: 415 pg/mL (ref 211–946)
WBC: 4.8 10*3/uL (ref 3.4–10.8)

## 2013-12-06 LAB — NMR, LIPOPROFILE
Cholesterol: 155 mg/dL (ref 100–199)
HDL CHOLESTEROL BY NMR: 43 mg/dL (ref >39)
HDL Particle Number: 31.1 umol/L (ref >=30.5)
LDL PARTICLE NUMBER: 1112 nmol/L — AB (ref <1000)
LDL Size: 20.4 nm (ref >20.5)
LDLC SERPL CALC-MCNC: 78 mg/dL (ref 0–99)
LP-IR Score: 56 — ABNORMAL HIGH (ref <=45)
Small LDL Particle Number: 699 nmol/L — ABNORMAL HIGH (ref <=527)
Triglycerides by NMR: 169 mg/dL — ABNORMAL HIGH (ref 0–149)

## 2013-12-16 ENCOUNTER — Encounter: Payer: Self-pay | Admitting: *Deleted

## 2013-12-24 ENCOUNTER — Other Ambulatory Visit: Payer: Self-pay | Admitting: Nurse Practitioner

## 2014-01-17 ENCOUNTER — Telehealth: Payer: Self-pay | Admitting: Nurse Practitioner

## 2014-01-17 DIAGNOSIS — G5691 Unspecified mononeuropathy of right upper limb: Secondary | ICD-10-CM

## 2014-01-17 MED ORDER — FENTANYL 75 MCG/HR TD PT72: 75.0000 ug | MEDICATED_PATCH | TRANSDERMAL | Status: DC

## 2014-01-17 NOTE — Telephone Encounter (Signed)
Patient aware.

## 2014-01-17 NOTE — Telephone Encounter (Signed)
rx ready for pickup 

## 2014-02-11 ENCOUNTER — Other Ambulatory Visit: Payer: Self-pay | Admitting: Nurse Practitioner

## 2014-02-13 ENCOUNTER — Encounter: Payer: Self-pay | Admitting: Nurse Practitioner

## 2014-02-27 ENCOUNTER — Other Ambulatory Visit: Payer: Self-pay | Admitting: Nurse Practitioner

## 2014-02-27 DIAGNOSIS — G5691 Unspecified mononeuropathy of right upper limb: Secondary | ICD-10-CM

## 2014-02-27 MED ORDER — FENTANYL 75 MCG/HR TD PT72: 75.0000 ug | MEDICATED_PATCH | TRANSDERMAL | Status: DC

## 2014-02-27 NOTE — Telephone Encounter (Signed)
Script ready.

## 2014-02-27 NOTE — Telephone Encounter (Signed)
Fentanyl rx ready for pick up  

## 2014-03-23 ENCOUNTER — Encounter: Payer: Self-pay | Admitting: Nurse Practitioner

## 2014-03-23 ENCOUNTER — Ambulatory Visit (INDEPENDENT_AMBULATORY_CARE_PROVIDER_SITE_OTHER): Payer: Medicaid Other | Admitting: *Deleted

## 2014-03-23 ENCOUNTER — Ambulatory Visit (INDEPENDENT_AMBULATORY_CARE_PROVIDER_SITE_OTHER): Payer: Medicaid Other | Admitting: Nurse Practitioner

## 2014-03-23 VITALS — BP 117/71 | HR 46 | Temp 97.8°F

## 2014-03-23 DIAGNOSIS — Z23 Encounter for immunization: Secondary | ICD-10-CM

## 2014-03-23 DIAGNOSIS — G5690 Unspecified mononeuropathy of unspecified upper limb: Secondary | ICD-10-CM

## 2014-03-23 DIAGNOSIS — L97909 Non-pressure chronic ulcer of unspecified part of unspecified lower leg with unspecified severity: Secondary | ICD-10-CM

## 2014-03-23 DIAGNOSIS — I471 Supraventricular tachycardia, unspecified: Secondary | ICD-10-CM

## 2014-03-23 DIAGNOSIS — D509 Iron deficiency anemia, unspecified: Secondary | ICD-10-CM

## 2014-03-23 DIAGNOSIS — E785 Hyperlipidemia, unspecified: Secondary | ICD-10-CM

## 2014-03-23 DIAGNOSIS — R600 Localized edema: Secondary | ICD-10-CM

## 2014-03-23 DIAGNOSIS — E876 Hypokalemia: Secondary | ICD-10-CM

## 2014-03-23 DIAGNOSIS — F329 Major depressive disorder, single episode, unspecified: Secondary | ICD-10-CM

## 2014-03-23 DIAGNOSIS — G5691 Unspecified mononeuropathy of right upper limb: Secondary | ICD-10-CM

## 2014-03-23 DIAGNOSIS — R569 Unspecified convulsions: Secondary | ICD-10-CM

## 2014-03-23 DIAGNOSIS — F32A Depression, unspecified: Secondary | ICD-10-CM

## 2014-03-23 DIAGNOSIS — I83009 Varicose veins of unspecified lower extremity with ulcer of unspecified site: Secondary | ICD-10-CM

## 2014-03-23 DIAGNOSIS — R609 Edema, unspecified: Secondary | ICD-10-CM

## 2014-03-23 MED ORDER — FLUOXETINE HCL 40 MG PO CAPS: 80.0000 mg | ORAL_CAPSULE | Freq: Every day | ORAL | Status: DC

## 2014-03-23 MED ORDER — INTEGRA 62.5-62.5-40-3 MG PO CAPS: ORAL_CAPSULE | ORAL | Status: DC

## 2014-03-23 MED ORDER — FENTANYL 75 MCG/HR TD PT72: 75.0000 ug | MEDICATED_PATCH | TRANSDERMAL | Status: DC

## 2014-03-23 MED ORDER — LEVETIRACETAM 500 MG PO TABS: ORAL_TABLET | ORAL | Status: DC

## 2014-03-23 MED ORDER — GABAPENTIN 300 MG PO CAPS: ORAL_CAPSULE | ORAL | Status: DC

## 2014-03-23 MED ORDER — ATORVASTATIN CALCIUM 40 MG PO TABS: 40.0000 mg | ORAL_TABLET | Freq: Every day | ORAL | Status: DC

## 2014-03-23 MED ORDER — NITROGLYCERIN 0.4 MG SL SUBL: 0.4000 mg | SUBLINGUAL_TABLET | SUBLINGUAL | Status: DC | PRN

## 2014-03-23 MED ORDER — POTASSIUM CHLORIDE CRYS ER 20 MEQ PO TBCR: 20.0000 meq | EXTENDED_RELEASE_TABLET | Freq: Two times a day (BID) | ORAL | Status: DC

## 2014-03-23 MED ORDER — FUROSEMIDE 40 MG PO TABS: ORAL_TABLET | ORAL | Status: DC

## 2014-03-23 NOTE — Patient Instructions (Signed)

## 2014-03-23 NOTE — Progress Notes (Signed)
Subjective:    Patient ID: Angela Michael, female    DOB: March 02, 1961, 53 y.o.   MRN: [id number]  Doran Durand been in and out of the hospital with seizures over the last several months- neurologist attributes them to stress- Lots of studies and labs have been done at hospital which were all negative. [place] has had 2 brothers die in the last 6 months which have caused the stress.  Hyperlipidemia This is a chronic problem. The current episode started more than 1 year ago. The problem is uncontrolled. Recent lipid tests were reviewed and are high. Pertinent negatives include no chest pain. Current antihyperlipidemic treatment includes statins. The current treatment provides moderate improvement of lipids. Compliance problems include adherence to diet and adherence to exercise.  Risk factors for coronary artery disease include post-menopausal, dyslipidemia and obesity.  Seizures Keppra working well no seizure in several years. Peripheral edema Lasix working well to keep edema under control. Hypokalemia K-Dur working well. No C/O lower ext aches Chronic right arm pain and neuropathy Duragesic patches makes pain tolerable- neurotin helps a lot Depression Prozac working well- has occassional bouts of depression but nothing she can't handle  * Diagnosed with Lymphoma several months ago and they are trying to determine at this time the stage that she is in- May start radiation soon.  * scheduled for total knee replacement next month  Review of Systems  Constitutional: Negative.   HENT: Negative.   Respiratory: Negative for cough, choking and chest tightness.   Cardiovascular: Negative for chest pain.  Gastrointestinal: Negative.   Endocrine: Negative.   Genitourinary: Negative.   Musculoskeletal: Negative.   Allergic/Immunologic: Negative.   Neurological: Negative.   Psychiatric/Behavioral: Negative.   All other systems reviewed and are negative.      Objective:   Physical Exam   Constitutional: She is oriented to person, place, and time. She appears well-developed and well-nourished.  HENT:  Nose: Nose normal.  Mouth/Throat: Oropharynx is clear and moist.  Eyes: EOM are normal.  Neck: Trachea normal, normal range of motion and full passive range of motion without pain. Neck supple. No JVD present. Carotid bruit is not present. No thyromegaly present.  Cardiovascular: Normal rate, regular rhythm, normal heart sounds and intact distal pulses.  Exam reveals no gallop and no friction rub.   No murmur heard. Pulmonary/Chest: Effort normal and breath sounds normal.  Abdominal: Soft. Bowel sounds are normal. She exhibits no distension and no mass. There is no tenderness.  Musculoskeletal: Normal range of motion.  Lymphadenopathy:    She has no cervical adenopathy.  Neurological: She is alert and oriented to person, place, and time. She has normal reflexes.  Skin: Skin is warm and dry.  Psychiatric: She has a normal mood and affect. Her behavior is normal. Judgment and thought content normal.     BP 117/71 mmHg  Pulse 46  Temp(Src) 97.8 F (36.6 C) (Oral)  Wt       Assessment & Plan:   1. SVT (supraventricular tachycardia)   2. Neuropathy of upper extremity, right   3. Depression   4. Peripheral edema   5. Hyperlipidemia with target LDL less than 100   6. Hypokalemia   7. Morbid obesity   8. Seizures   9. Iron deficiency anemia   10. Neuropathy of upper extremity, unspecified laterality   11. Varicose veins of lower extremities with ulcer, unspecified laterality    Meds ordered this encounter  Medications  . Fe Fum-FePoly-Vit C-Vit B3 (INTEGRA) 62.5-62.5-40-3 MG  CAPS    Sig: TAKE ONE CAPSULE BY MOUTH EVERY DAY    Dispense:  30 capsule    Refill:  5    Order Specific Question:  Supervising Provider    Answer:  Chipper Herb [[id number]]  . fentaNYL (DURAGESIC - DOSED MCG/HR) 75 MCG/HR    Sig: Place 1 patch (75 mcg total) onto the skin every 3 (three)  days.    Dispense:  10 patch    Refill:  0    Order Specific Question:  Supervising Provider    Answer:  Chipper Herb [[id number]]  . gabapentin (NEURONTIN) 300 MG capsule    Sig: TAKE 2 CAPSULE BY MOUTH 2 TIMES DAILY    Dispense:  120 capsule    Refill:  5    Order Specific Question:  Supervising Provider    Answer:  Chipper Herb [[id number]]  . levETIRAcetam (KEPPRA) 500 MG tablet    Sig: TAKE 2 TABLETS BY MOUTH 2 TIMES DAILY    Dispense:  120 tablet    Refill:  5    Order Specific Question:  Supervising Provider    Answer:  Chipper Herb [[id number]]  . atorvastatin (LIPITOR) 40 MG tablet    Sig: Take 1 tablet (40 mg total) by mouth at bedtime.    Dispense:  30 tablet    Refill:  4    Order Specific Question:  Supervising Provider    Answer:  Chipper Herb [[id number]]  . FLUoxetine (PROZAC) 40 MG capsule    Sig: Take 2 capsules (80 mg total) by mouth daily.    Dispense:  60 capsule    Refill:  2    Order Specific Question:  Supervising Provider    Answer:  Chipper Herb [[id number]]  . furosemide (LASIX) 40 MG tablet    Sig: TAKE 1 TABLET (40 MG TOTAL) BY MOUTH DAILY.    Dispense:  30 tablet    Refill:  5    Order Specific Question:  Supervising Provider    Answer:  Chipper Herb [[id number]]  . nitroGLYCERIN (NITROSTAT) 0.4 MG SL tablet    Sig: Place 1 tablet (0.4 mg total) under the tongue every 5 (five) minutes as needed for chest pain.    Dispense:  25 tablet    Refill:  3    Order Specific Question:  Supervising Provider    Answer:  Chipper Herb [[id number]]  . potassium chloride SA (K-DUR,KLOR-CON) 20 MEQ tablet    Sig: Take 1 tablet (20 mEq total) by mouth 2 (two) times daily.    Dispense:  60 tablet    Refill:  5    Order Specific Question:  Supervising Provider    Answer:  Chipper Herb [[id number]]   Orders Placed This Encounter  Procedures  . CMP14+EGFR  . NMR, lipoprofile  . Anemia Profile B   Continue all meds Labs pending Health maintenance reviewed RTO in 3  months  [place], FNP

## 2014-03-25 LAB — ANEMIA PROFILE B
BASOS: 0 %
Basophils Absolute: 0 10*3/uL (ref 0.0–0.2)
EOS: 4 %
Eosinophils Absolute: 0.2 10*3/uL (ref 0.0–0.4)
FOLATE: 12.8 ng/mL (ref >3.0)
Ferritin: 173 ng/mL — ABNORMAL HIGH (ref 15–150)
HEMATOCRIT: 38.6 % (ref 34.0–46.6)
HEMOGLOBIN: 12.6 g/dL (ref 11.1–15.9)
Immature Grans (Abs): 0 10*3/uL (ref 0.0–0.1)
Immature Granulocytes: 0 %
Iron Saturation: 28 % (ref 15–55)
Iron: 88 ug/dL (ref 35–155)
LYMPHS ABS: 0.7 10*3/uL (ref 0.7–3.1)
LYMPHS: 16 %
MCH: 31.7 pg (ref 26.6–33.0)
MCHC: 32.6 g/dL (ref 31.5–35.7)
MCV: 97 fL (ref 79–97)
MONOCYTES: 8 %
Monocytes Absolute: 0.4 10*3/uL (ref 0.1–0.9)
NEUTROS ABS: 3 10*3/uL (ref 1.4–7.0)
Neutrophils Relative %: 72 %
Platelets: 149 10*3/uL — ABNORMAL LOW (ref 150–379)
RBC: 3.98 x10E6/uL (ref 3.77–5.28)
RDW: 13.4 % (ref 12.3–15.4)
RETIC CT PCT: 1.7 % (ref 0.6–2.6)
TIBC: 314 ug/dL (ref 250–450)
UIBC: 226 ug/dL (ref 150–375)
Vitamin B-12: 522 pg/mL (ref 211–946)
WBC: 4.3 10*3/uL (ref 3.4–10.8)

## 2014-03-25 LAB — NMR, LIPOPROFILE
Cholesterol: 138 mg/dL (ref 100–199)
HDL Cholesterol by NMR: 51 mg/dL (ref >39)
HDL PARTICLE NUMBER: 34.8 umol/L (ref >=30.5)
LDL Particle Number: 807 nmol/L (ref <1000)
LDL SIZE: 21.1 nm (ref >20.5)
LDL-C: 62 mg/dL (ref 0–99)
LP-IR SCORE: 42 (ref <=45)
Small LDL Particle Number: 373 nmol/L (ref <=527)
Triglycerides by NMR: 126 mg/dL (ref 0–149)

## 2014-03-25 LAB — CMP14+EGFR
ALK PHOS: 111 IU/L (ref 39–117)
ALT: 34 IU/L — ABNORMAL HIGH (ref 0–32)
AST: 29 IU/L (ref 0–40)
Albumin/Globulin Ratio: 2 (ref 1.1–2.5)
Albumin: 4.4 g/dL (ref 3.5–5.5)
BUN/Creatinine Ratio: 20 (ref 9–23)
BUN: 17 mg/dL (ref 6–24)
CHLORIDE: 102 mmol/L (ref 97–108)
CO2: 24 mmol/L (ref 18–29)
CREATININE: 0.87 mg/dL (ref 0.57–1.00)
Calcium: 9.1 mg/dL (ref 8.7–10.2)
GFR calc Af Amer: 89 mL/min/{1.73_m2} (ref >59)
GFR calc non Af Amer: 77 mL/min/{1.73_m2} (ref >59)
GLOBULIN, TOTAL: 2.2 g/dL (ref 1.5–4.5)
Glucose: 93 mg/dL (ref 65–99)
Potassium: 4.4 mmol/L (ref 3.5–5.2)
SODIUM: 140 mmol/L (ref 134–144)
Total Bilirubin: 1.1 mg/dL (ref 0.0–1.2)
Total Protein: 6.6 g/dL (ref 6.0–8.5)

## 2014-04-17 ENCOUNTER — Other Ambulatory Visit: Payer: Self-pay | Admitting: Nurse Practitioner

## 2014-04-24 ENCOUNTER — Encounter: Payer: Self-pay | Admitting: *Deleted

## 2014-05-08 ENCOUNTER — Telehealth: Payer: Self-pay | Admitting: Nurse Practitioner

## 2014-05-08 DIAGNOSIS — G5691 Unspecified mononeuropathy of right upper limb: Secondary | ICD-10-CM

## 2014-05-08 MED ORDER — FENTANYL 75 MCG/HR TD PT72: 75.0000 ug | MEDICATED_PATCH | TRANSDERMAL | Status: DC

## 2014-05-08 NOTE — Telephone Encounter (Signed)
Fentanyl rx ready for pick up

## 2014-05-10 HISTORY — PX: MEDIAL PARTIAL KNEE REPLACEMENT: SHX5965

## 2014-06-09 ENCOUNTER — Telehealth: Payer: Self-pay | Admitting: Nurse Practitioner

## 2014-06-09 DIAGNOSIS — G5691 Unspecified mononeuropathy of right upper limb: Secondary | ICD-10-CM

## 2014-06-09 MED ORDER — FENTANYL 75 MCG/HR TD PT72: 75.0000 ug | MEDICATED_PATCH | TRANSDERMAL | Status: DC

## 2014-06-09 NOTE — Telephone Encounter (Signed)
Fentanyl rx ready for pick up

## 2014-06-12 NOTE — Telephone Encounter (Signed)
Message left that rx ready to be picked up

## 2014-06-23 ENCOUNTER — Ambulatory Visit (INDEPENDENT_AMBULATORY_CARE_PROVIDER_SITE_OTHER): Payer: Medicaid Other | Admitting: Nurse Practitioner

## 2014-06-23 ENCOUNTER — Encounter: Payer: Self-pay | Admitting: Nurse Practitioner

## 2014-06-23 VITALS — BP 139/82 | HR 48 | Temp 97.4°F

## 2014-06-23 DIAGNOSIS — I471 Supraventricular tachycardia, unspecified: Secondary | ICD-10-CM

## 2014-06-23 DIAGNOSIS — G5691 Unspecified mononeuropathy of right upper limb: Secondary | ICD-10-CM

## 2014-06-23 DIAGNOSIS — D509 Iron deficiency anemia, unspecified: Secondary | ICD-10-CM

## 2014-06-23 DIAGNOSIS — G5690 Unspecified mononeuropathy of unspecified upper limb: Secondary | ICD-10-CM

## 2014-06-23 DIAGNOSIS — D510 Vitamin B12 deficiency anemia due to intrinsic factor deficiency: Secondary | ICD-10-CM | POA: Diagnosis not present

## 2014-06-23 DIAGNOSIS — R569 Unspecified convulsions: Secondary | ICD-10-CM

## 2014-06-23 DIAGNOSIS — E876 Hypokalemia: Secondary | ICD-10-CM

## 2014-06-23 DIAGNOSIS — E785 Hyperlipidemia, unspecified: Secondary | ICD-10-CM

## 2014-06-23 DIAGNOSIS — R609 Edema, unspecified: Secondary | ICD-10-CM | POA: Diagnosis not present

## 2014-06-23 DIAGNOSIS — I83009 Varicose veins of unspecified lower extremity with ulcer of unspecified site: Secondary | ICD-10-CM

## 2014-06-23 DIAGNOSIS — R600 Localized edema: Secondary | ICD-10-CM

## 2014-06-23 DIAGNOSIS — F329 Major depressive disorder, single episode, unspecified: Secondary | ICD-10-CM | POA: Diagnosis not present

## 2014-06-23 DIAGNOSIS — L97909 Non-pressure chronic ulcer of unspecified part of unspecified lower leg with unspecified severity: Secondary | ICD-10-CM

## 2014-06-23 DIAGNOSIS — F32A Depression, unspecified: Secondary | ICD-10-CM

## 2014-06-23 MED ORDER — ATORVASTATIN CALCIUM 40 MG PO TABS: 40.0000 mg | ORAL_TABLET | Freq: Every day | ORAL | Status: DC

## 2014-06-23 MED ORDER — LEVETIRACETAM 500 MG PO TABS: ORAL_TABLET | ORAL | Status: DC

## 2014-06-23 MED ORDER — INTEGRA 62.5-62.5-40-3 MG PO CAPS: ORAL_CAPSULE | ORAL | Status: DC

## 2014-06-23 MED ORDER — FUROSEMIDE 40 MG PO TABS: ORAL_TABLET | ORAL | Status: DC

## 2014-06-23 MED ORDER — FLUOXETINE HCL 40 MG PO CAPS: 80.0000 mg | ORAL_CAPSULE | Freq: Every day | ORAL | Status: DC

## 2014-06-23 MED ORDER — XARELTO 10 MG PO TABS: 10.0000 mg | ORAL_TABLET | Freq: Every day | ORAL | Status: DC

## 2014-06-23 MED ORDER — FENTANYL 75 MCG/HR TD PT72: 75.0000 ug | MEDICATED_PATCH | TRANSDERMAL | Status: DC

## 2014-06-23 MED ORDER — POTASSIUM CHLORIDE CRYS ER 20 MEQ PO TBCR: 20.0000 meq | EXTENDED_RELEASE_TABLET | Freq: Two times a day (BID) | ORAL | Status: DC

## 2014-06-23 MED ORDER — GABAPENTIN 300 MG PO CAPS: ORAL_CAPSULE | ORAL | Status: DC

## 2014-06-23 NOTE — Patient Instructions (Signed)
Fat and Cholesterol Control Diet Fat and cholesterol levels in your blood and organs are influenced by your diet. High levels of fat and cholesterol may lead to diseases of the heart, small and large blood vessels, gallbladder, liver, and pancreas. CONTROLLING FAT AND CHOLESTEROL WITH DIET Although exercise and lifestyle factors are important, your diet is key. That is because certain foods are known to raise cholesterol and others to lower it. The goal is to balance foods for their effect on cholesterol and more importantly, to replace saturated and trans fat with other types of fat, such as monounsaturated fat, polyunsaturated fat, and omega-3 fatty acids. On average, a person should consume no more than 15 to 17 g of saturated fat daily. Saturated and trans fats are considered "bad" fats, and they will raise LDL cholesterol. Saturated fats are primarily found in animal products such as meats, butter, and cream. However, that does not mean you need to give up all your favorite foods. Today, there are good tasting, low-fat, low-cholesterol substitutes for most of the things you like to eat. Choose low-fat or nonfat alternatives. Choose round or loin cuts of red meat. These types of cuts are lowest in fat and cholesterol. Chicken (without the skin), fish, veal, and ground turkey breast are great choices. Eliminate fatty meats, such as hot dogs and salami. Even shellfish have little or no saturated fat. Have a 3 oz (85 g) portion when you eat lean meat, poultry, or fish. Trans fats are also called "partially hydrogenated oils." They are oils that have been scientifically manipulated so that they are solid at room temperature resulting in a longer shelf life and improved taste and texture of foods in which they are added. Trans fats are found in stick margarine, some tub margarines, cookies, crackers, and baked goods.  When baking and cooking, oils are a great substitute for butter. The monounsaturated oils are  especially beneficial since it is believed they lower LDL and raise HDL. The oils you should avoid entirely are saturated tropical oils, such as coconut and palm.  Remember to eat a lot from food groups that are naturally free of saturated and trans fat, including fish, fruit, vegetables, beans, grains (barley, rice, couscous, bulgur wheat), and pasta (without cream sauces).  IDENTIFYING FOODS THAT LOWER FAT AND CHOLESTEROL  Soluble fiber may lower your cholesterol. This type of fiber is found in fruits such as apples, vegetables such as broccoli, potatoes, and carrots, legumes such as beans, peas, and lentils, and grains such as barley. Foods fortified with plant sterols (phytosterol) may also lower cholesterol. You should eat at least 2 g per day of these foods for a cholesterol lowering effect.  Read package labels to identify low-saturated fats, trans fat free, and low-fat foods at the supermarket. Select cheeses that have only 2 to 3 g saturated fat per ounce. Use a heart-healthy tub margarine that is free of trans fats or partially hydrogenated oil. When buying baked goods (cookies, crackers), avoid partially hydrogenated oils. Breads and muffins should be made from whole grains (whole-wheat or whole oat flour, instead of "flour" or "enriched flour"). Buy non-creamy canned soups with reduced salt and no added fats.  FOOD PREPARATION TECHNIQUES  Never deep-fry. If you must fry, either stir-fry, which uses very little fat, or use non-stick cooking sprays. When possible, broil, bake, or roast meats, and steam vegetables. Instead of putting butter or margarine on vegetables, use lemon and herbs, applesauce, and cinnamon (for squash and sweet potatoes). Use nonfat   yogurt, salsa, and low-fat dressings for salads.  LOW-SATURATED FAT / LOW-FAT FOOD SUBSTITUTES Meats / Saturated Fat (g)  Avoid: Steak, marbled (3 oz/85 g) / 11 g  Choose: Steak, lean (3 oz/85 g) / 4 g  Avoid: Hamburger (3 oz/85 g) / 7  g  Choose: Hamburger, lean (3 oz/85 g) / 5 g  Avoid: Ham (3 oz/85 g) / 6 g  Choose: Ham, lean cut (3 oz/85 g) / 2.4 g  Avoid: Chicken, with skin, dark meat (3 oz/85 g) / 4 g  Choose: Chicken, skin removed, dark meat (3 oz/85 g) / 2 g  Avoid: Chicken, with skin, light meat (3 oz/85 g) / 2.5 g  Choose: Chicken, skin removed, light meat (3 oz/85 g) / 1 g Dairy / Saturated Fat (g)  Avoid: Whole milk (1 cup) / 5 g  Choose: Low-fat milk, 2% (1 cup) / 3 g  Choose: Low-fat milk, 1% (1 cup) / 1.5 g  Choose: Skim milk (1 cup) / 0.3 g  Avoid: Hard cheese (1 oz/28 g) / 6 g  Choose: Skim milk cheese (1 oz/28 g) / 2 to 3 g  Avoid: Cottage cheese, 4% fat (1 cup) / 6.5 g  Choose: Low-fat cottage cheese, 1% fat (1 cup) / 1.5 g  Avoid: Ice cream (1 cup) / 9 g  Choose: Sherbet (1 cup) / 2.5 g  Choose: Nonfat frozen yogurt (1 cup) / 0.3 g  Choose: Frozen fruit bar / trace  Avoid: Whipped cream (1 tbs) / 3.5 g  Choose: Nondairy whipped topping (1 tbs) / 1 g Condiments / Saturated Fat (g)  Avoid: Mayonnaise (1 tbs) / 2 g  Choose: Low-fat mayonnaise (1 tbs) / 1 g  Avoid: Butter (1 tbs) / 7 g  Choose: Extra light margarine (1 tbs) / 1 g  Avoid: Coconut oil (1 tbs) / 11.8 g  Choose: Olive oil (1 tbs) / 1.8 g  Choose: Corn oil (1 tbs) / 1.7 g  Choose: Safflower oil (1 tbs) / 1.2 g  Choose: Sunflower oil (1 tbs) / 1.4 g  Choose: Soybean oil (1 tbs) / 2.4 g  Choose: Canola oil (1 tbs) / 1 g Document Released: 03/31/2005 Document Revised: 07/26/2012 Document Reviewed: 06/29/2013 [employer] Patient Information 2015 [employer]. This information is not intended to replace advice given to you by your health care provider. Make sure you discuss any questions you have with your health care provider.  

## 2014-06-23 NOTE — Progress Notes (Signed)
Subjective:    Patient ID: Angela Michael, female    DOB: [birth year], 54 y.o.   MRN: [id number]   Patient here today for follow up of chronic medical problems. Has had partial total knee replacement on left knee since last visit. Doing well- has completed physical therapy.    Hyperlipidemia This is a chronic problem. The current episode started more than 1 year ago. The problem is uncontrolled. Recent lipid tests were reviewed and are high. Pertinent negatives include no chest pain. Current antihyperlipidemic treatment includes statins. The current treatment provides moderate improvement of lipids. Compliance problems include adherence to diet and adherence to exercise.  Risk factors for coronary artery disease include post-menopausal, dyslipidemia and obesity.  Seizures Keppra working well no seizure in several years. Peripheral edema Lasix working well to keep edema under control. Hypokalemia K-Dur working well. No C/O lower ext aches Chronic right arm pain and neuropathy Duragesic patches makes pain tolerable- neurotin helps a lot Depression Prozac working well- has occassional bouts of depression but nothing she can't handle  * Diagnosed with Lymphoma several months ago and they are trying to determine at this time the stage that she is in- has completed chemo and has follow up in May 2016    Review of Systems  Constitutional: Negative.   HENT: Negative.   Respiratory: Negative for cough, choking and chest tightness.   Cardiovascular: Negative for chest pain.  Gastrointestinal: Negative.   Endocrine: Negative.   Genitourinary: Negative.   Musculoskeletal: Negative.   Allergic/Immunologic: Negative.   Neurological: Negative.   Psychiatric/Behavioral: Negative.   All other systems reviewed and are negative.      Objective:   Physical Exam  Constitutional: She is oriented to person, place, and time. She appears well-developed and well-nourished.  HENT:  Nose: Nose  normal.  Mouth/Throat: Oropharynx is clear and moist.  Eyes: EOM are normal.  Neck: Trachea normal, normal range of motion and full passive range of motion without pain. Neck supple. No JVD present. Carotid bruit is not present. No thyromegaly present.  Cardiovascular: Normal rate, normal heart sounds and intact distal pulses.  Exam reveals no gallop and no friction rub.   No murmur heard. Regular irregularity  Pulmonary/Chest: Effort normal and breath sounds normal.  Abdominal: Soft. Bowel sounds are normal. She exhibits no distension and no mass. There is no tenderness.  Musculoskeletal: Normal range of motion.  Improving ROM of left knee- scar healing without problems  Lymphadenopathy:    She has no cervical adenopathy.  Neurological: She is alert and oriented to person, place, and time. She has normal reflexes.  Skin: Skin is warm and dry.  Psychiatric: She has a normal mood and affect. Her behavior is normal. Judgment and thought content normal.     BP 139/82 mmHg  Pulse 48  Temp(Src) 97.4 F (36.3 C) (Oral)  Wt       Assessment & Plan:   1. SVT (supraventricular tachycardia) Avoid caffeine - XARELTO 10 MG TABS tablet; Take 1 tablet (10 mg total) by mouth daily.  Dispense: 30 tablet; Refill: 5  2. Neuropathy of upper extremity, right - fentaNYL (DURAGESIC - DOSED MCG/HR) 75 MCG/HR; Place 1 patch (75 mcg total) onto the skin every 3 (three) days.  Dispense: 10 patch; Refill: 0  3. Seizures  4. Peripheral edema elevat when sitting - furosemide (LASIX) 40 MG tablet; TAKE 1 TABLET (40 MG TOTAL) BY MOUTH DAILY.  Dispense: 30 tablet; Refill: 5  5. Morbid obesity Low fat diet -  CMP14+EGFR - NMR, lipoprofile  6. Hyperlipidemia with target LDL less than 100  - atorvastatin (LIPITOR) 40 MG tablet; Take 1 tablet (40 mg total) by mouth at bedtime.  Dispense: 30 tablet; Refill: 5  7. Hypokalemia - potassium chloride SA (K-DUR,KLOR-CON) 20 MEQ tablet; Take 1 tablet (20 mEq  total) by mouth 2 (two) times daily.  Dispense: 60 tablet; Refill: 5  8. Depression Stress management - FLUoxetine (PROZAC) 40 MG capsule; Take 2 capsules (80 mg total) by mouth daily.  Dispense: 60 capsule; Refill: 5  9. 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 Iron deficiency anemia - Fe Fum-FePoly-Vit C-Vit B3 (INTEGRA) 62.5-62.5-40-3 MG CAPS; TAKE ONE CAPSULE BY MOUTH EVERY DAY  Dispense: 30 capsule; Refill: 5  11. Neuropathy of upper extremity, unspecified laterality - gabapentin (NEURONTIN) 300 MG capsule; TAKE 2 CAPSULE BY MOUTH 2 TIMES DAILY  Dispense: 120 capsule; Refill: 5  12. Varicose veins of lower extremities with ulcer, unspecified laterality - levETIRAcetam (KEPPRA) 500 MG tablet; TAKE 2 TABLETS BY MOUTH 2 TIMES DAILY  Dispense: 120 tablet; Refill: 5    Labs pending Health maintenance reviewed Diet and exercise encouraged Continue all meds Follow up  In 3 months   [place], FNP

## 2014-06-24 LAB — CMP14+EGFR
A/G RATIO: 2.2 (ref 1.1–2.5)
ALBUMIN: 4.1 g/dL (ref 3.5–5.5)
ALT: 20 IU/L (ref 0–32)
AST: 18 IU/L (ref 0–40)
Alkaline Phosphatase: 98 IU/L (ref 39–117)
BUN / CREAT RATIO: 14 (ref 9–23)
BUN: 10 mg/dL (ref 6–24)
Bilirubin Total: 0.6 mg/dL (ref 0.0–1.2)
CALCIUM: 8.9 mg/dL (ref 8.7–10.2)
CO2: 26 mmol/L (ref 18–29)
Chloride: 106 mmol/L (ref 97–108)
Creatinine, Ser: 0.72 mg/dL (ref 0.57–1.00)
GFR calc Af Amer: 112 mL/min/{1.73_m2} (ref >59)
GFR calc non Af Amer: 97 mL/min/{1.73_m2} (ref >59)
GLUCOSE: 78 mg/dL (ref 65–99)
Globulin, Total: 1.9 g/dL (ref 1.5–4.5)
Potassium: 4 mmol/L (ref 3.5–5.2)
Sodium: 145 mmol/L — ABNORMAL HIGH (ref 134–144)
Total Protein: 6 g/dL (ref 6.0–8.5)

## 2014-06-24 LAB — NMR, LIPOPROFILE
Cholesterol: 139 mg/dL (ref 100–199)
HDL Cholesterol by NMR: 56 mg/dL (ref >39)
HDL PARTICLE NUMBER: 35 umol/L (ref >=30.5)
LDL Particle Number: 944 nmol/L (ref <1000)
LDL Size: 20.9 nm (ref >20.5)
LDL-C: 64 mg/dL (ref 0–99)
LP-IR SCORE: 50 — AB (ref <=45)
Small LDL Particle Number: 388 nmol/L (ref <=527)
Triglycerides by NMR: 93 mg/dL (ref 0–149)

## 2014-06-27 ENCOUNTER — Other Ambulatory Visit: Payer: Self-pay | Admitting: Nurse Practitioner

## 2014-07-31 ENCOUNTER — Encounter (HOSPITAL_COMMUNITY): Payer: Self-pay | Admitting: *Deleted

## 2014-07-31 ENCOUNTER — Other Ambulatory Visit: Payer: Self-pay | Admitting: Nurse Practitioner

## 2014-07-31 ENCOUNTER — Emergency Department (HOSPITAL_COMMUNITY): Payer: Medicaid Other

## 2014-07-31 ENCOUNTER — Other Ambulatory Visit (HOSPITAL_COMMUNITY): Payer: Self-pay

## 2014-07-31 ENCOUNTER — Inpatient Hospital Stay (HOSPITAL_COMMUNITY)
Admission: EM | Admit: 2014-07-31 | Discharge: 2014-08-03 | DRG: 809 | Disposition: A | Discharge status: Home / Self Care | Payer: Medicaid Other | Attending: Internal Medicine | Admitting: Internal Medicine

## 2014-07-31 DIAGNOSIS — I471 Supraventricular tachycardia, unspecified: Secondary | ICD-10-CM | POA: Diagnosis present

## 2014-07-31 DIAGNOSIS — R569 Unspecified convulsions: Secondary | ICD-10-CM | POA: Diagnosis not present

## 2014-07-31 DIAGNOSIS — R059 Cough, unspecified: Secondary | ICD-10-CM

## 2014-07-31 DIAGNOSIS — R5081 Fever presenting with conditions classified elsewhere: Secondary | ICD-10-CM | POA: Diagnosis present

## 2014-07-31 DIAGNOSIS — D61818 Other pancytopenia: Secondary | ICD-10-CM | POA: Diagnosis not present

## 2014-07-31 DIAGNOSIS — E119 Type 2 diabetes mellitus without complications: Secondary | ICD-10-CM | POA: Diagnosis present

## 2014-07-31 DIAGNOSIS — C8210 Follicular lymphoma grade II, unspecified site: Secondary | ICD-10-CM | POA: Diagnosis present

## 2014-07-31 DIAGNOSIS — D709 Neutropenia, unspecified: Principal | ICD-10-CM | POA: Diagnosis present

## 2014-07-31 DIAGNOSIS — Z86718 Personal history of other venous thrombosis and embolism: Secondary | ICD-10-CM

## 2014-07-31 DIAGNOSIS — Z9221 Personal history of antineoplastic chemotherapy: Secondary | ICD-10-CM | POA: Diagnosis not present

## 2014-07-31 DIAGNOSIS — D510 Vitamin B12 deficiency anemia due to intrinsic factor deficiency: Secondary | ICD-10-CM | POA: Diagnosis not present

## 2014-07-31 DIAGNOSIS — Z7901 Long term (current) use of anticoagulants: Secondary | ICD-10-CM

## 2014-07-31 DIAGNOSIS — R6510 Systemic inflammatory response syndrome (SIRS) of non-infectious origin without acute organ dysfunction: Secondary | ICD-10-CM | POA: Diagnosis present

## 2014-07-31 DIAGNOSIS — C8290 Follicular lymphoma, unspecified, unspecified site: Secondary | ICD-10-CM

## 2014-07-31 DIAGNOSIS — D696 Thrombocytopenia, unspecified: Secondary | ICD-10-CM | POA: Diagnosis present

## 2014-07-31 DIAGNOSIS — Z8249 Family history of ischemic heart disease and other diseases of the circulatory system: Secondary | ICD-10-CM | POA: Diagnosis not present

## 2014-07-31 DIAGNOSIS — J069 Acute upper respiratory infection, unspecified: Secondary | ICD-10-CM | POA: Diagnosis present

## 2014-07-31 DIAGNOSIS — D649 Anemia, unspecified: Secondary | ICD-10-CM | POA: Diagnosis not present

## 2014-07-31 DIAGNOSIS — Z6841 Body Mass Index (BMI) 40.0 and over, adult: Secondary | ICD-10-CM

## 2014-07-31 DIAGNOSIS — Z808 Family history of malignant neoplasm of other organs or systems: Secondary | ICD-10-CM

## 2014-07-31 DIAGNOSIS — Z833 Family history of diabetes mellitus: Secondary | ICD-10-CM

## 2014-07-31 DIAGNOSIS — R509 Fever, unspecified: Secondary | ICD-10-CM | POA: Diagnosis not present

## 2014-07-31 DIAGNOSIS — Z96652 Presence of left artificial knee joint: Secondary | ICD-10-CM | POA: Diagnosis present

## 2014-07-31 DIAGNOSIS — R05 Cough: Secondary | ICD-10-CM

## 2014-07-31 DIAGNOSIS — Z800 Family history of malignant neoplasm of digestive organs: Secondary | ICD-10-CM | POA: Diagnosis not present

## 2014-07-31 DIAGNOSIS — E785 Hyperlipidemia, unspecified: Secondary | ICD-10-CM | POA: Diagnosis present

## 2014-07-31 DIAGNOSIS — R0902 Hypoxemia: Secondary | ICD-10-CM | POA: Diagnosis present

## 2014-07-31 DIAGNOSIS — E876 Hypokalemia: Secondary | ICD-10-CM | POA: Diagnosis present

## 2014-07-31 DIAGNOSIS — Z9884 Bariatric surgery status: Secondary | ICD-10-CM | POA: Diagnosis not present

## 2014-07-31 DIAGNOSIS — C8590 Non-Hodgkin lymphoma, unspecified, unspecified site: Secondary | ICD-10-CM | POA: Diagnosis present

## 2014-07-31 LAB — URINALYSIS, ROUTINE W REFLEX MICROSCOPIC
BILIRUBIN URINE: NEGATIVE
Glucose, UA: NEGATIVE mg/dL
Hgb urine dipstick: NEGATIVE
Ketones, ur: NEGATIVE mg/dL
Leukocytes, UA: NEGATIVE
Nitrite: NEGATIVE
PH: 5.5 (ref 5.0–8.0)
PROTEIN: 30 mg/dL — AB
Specific Gravity, Urine: 1.025 (ref 1.005–1.030)
Urobilinogen, UA: 4 mg/dL — ABNORMAL HIGH (ref 0.0–1.0)

## 2014-07-31 LAB — COMPREHENSIVE METABOLIC PANEL
ALBUMIN: 3.7 g/dL (ref 3.5–5.2)
ALT: 23 U/L (ref 0–35)
AST: 23 U/L (ref 0–37)
Alkaline Phosphatase: 85 U/L (ref 39–117)
Anion gap: 9 (ref 5–15)
BUN: 15 mg/dL (ref 6–23)
CALCIUM: 8.4 mg/dL (ref 8.4–10.5)
CO2: 26 mmol/L (ref 19–32)
CREATININE: 0.97 mg/dL (ref 0.50–1.10)
Chloride: 103 mmol/L (ref 96–112)
GFR calc Af Amer: 76 mL/min — ABNORMAL LOW (ref >90)
GFR calc non Af Amer: 66 mL/min — ABNORMAL LOW (ref >90)
Glucose, Bld: 148 mg/dL — ABNORMAL HIGH (ref 70–99)
Potassium: 3.4 mmol/L — ABNORMAL LOW (ref 3.5–5.1)
SODIUM: 138 mmol/L (ref 135–145)
Total Bilirubin: 1.9 mg/dL — ABNORMAL HIGH (ref 0.3–1.2)
Total Protein: 6.8 g/dL (ref 6.0–8.3)

## 2014-07-31 LAB — CBC WITH DIFFERENTIAL/PLATELET
BASOS ABS: 0 10*3/uL (ref 0.0–0.1)
Basophils Relative: 1 % (ref 0–1)
EOS ABS: 0 10*3/uL (ref 0.0–0.7)
Eosinophils Relative: 4 % (ref 0–5)
HCT: 35.1 % — ABNORMAL LOW (ref 36.0–46.0)
Hemoglobin: 11.7 g/dL — ABNORMAL LOW (ref 12.0–15.0)
Lymphocytes Relative: 59 % — ABNORMAL HIGH (ref 12–46)
Lymphs Abs: 0.4 10*3/uL — ABNORMAL LOW (ref 0.7–4.0)
MCH: 30.9 pg (ref 26.0–34.0)
MCHC: 33.3 g/dL (ref 30.0–36.0)
MCV: 92.6 fL (ref 78.0–100.0)
MONO ABS: 0.2 10*3/uL (ref 0.1–1.0)
Monocytes Relative: 25 % — ABNORMAL HIGH (ref 3–12)
Neutro Abs: 0.1 10*3/uL — ABNORMAL LOW (ref 1.7–7.7)
Neutrophils Relative %: 11 % — ABNORMAL LOW (ref 43–77)
PLATELETS: 93 10*3/uL — AB (ref 150–400)
RBC: 3.79 MIL/uL — ABNORMAL LOW (ref 3.87–5.11)
RDW: 12 % (ref 11.5–15.5)
WBC: 0.7 10*3/uL — CL (ref 4.0–10.5)

## 2014-07-31 LAB — RAPID STREP SCREEN (MED CTR MEBANE ONLY): STREPTOCOCCUS, GROUP A SCREEN (DIRECT): NEGATIVE

## 2014-07-31 LAB — LACTIC ACID, PLASMA
Lactic Acid, Venous: 2.4 mmol/L (ref 0.5–2.0)
Lactic Acid, Venous: 1 mmol/L (ref 0.5–2.0)

## 2014-07-31 LAB — INFLUENZA PANEL BY PCR (TYPE A & B)
H1N1FLUPCR: NOT DETECTED
INFLBPCR: NEGATIVE
Influenza A By PCR: NEGATIVE

## 2014-07-31 LAB — MAGNESIUM: Magnesium: 1.6 mg/dL (ref 1.5–2.5)

## 2014-07-31 LAB — URINE MICROSCOPIC-ADD ON

## 2014-07-31 LAB — TSH: TSH: 0.697 u[IU]/mL (ref 0.350–4.500)

## 2014-07-31 MED ORDER — ACETAMINOPHEN 650 MG RE SUPP: 650.0000 mg | Freq: Four times a day (QID) | RECTAL | Status: DC | PRN

## 2014-07-31 MED ORDER — KETOROLAC TROMETHAMINE 30 MG/ML IJ SOLN
30.0000 mg | Freq: Once | INTRAMUSCULAR | Status: AC
  Administered 2014-07-31: 30 mg via INTRAVENOUS
  Filled 2014-07-31: qty 1

## 2014-07-31 MED ORDER — ONDANSETRON HCL 4 MG/2ML IJ SOLN
4.0000 mg | Freq: Once | INTRAMUSCULAR | Status: AC
  Administered 2014-07-31: 4 mg via INTRAVENOUS
  Filled 2014-07-31: qty 2

## 2014-07-31 MED ORDER — POTASSIUM CHLORIDE CRYS ER 20 MEQ PO TBCR
40.0000 meq | EXTENDED_RELEASE_TABLET | ORAL | Status: AC
  Administered 2014-07-31 (×2): 40 meq via ORAL
  Filled 2014-07-31 (×2): qty 2

## 2014-07-31 MED ORDER — DEXTROSE 5 % IV SOLN
1.0000 g | Freq: Three times a day (TID) | INTRAVENOUS | Status: DC
  Administered 2014-07-31 – 2014-08-03 (×7): 1 g via INTRAVENOUS
  Filled 2014-07-31 (×12): qty 1

## 2014-07-31 MED ORDER — DOXYCYCLINE HYCLATE 100 MG PO TABS
100.0000 mg | ORAL_TABLET | Freq: Two times a day (BID) | ORAL | Status: DC
  Administered 2014-07-31 – 2014-08-03 (×7): 100 mg via ORAL
  Filled 2014-07-31 (×7): qty 1

## 2014-07-31 MED ORDER — SODIUM CHLORIDE 0.9 % IV SOLN
INTRAVENOUS | Status: DC
  Administered 2014-07-31 – 2014-08-01 (×3): via INTRAVENOUS

## 2014-07-31 MED ORDER — LEVETIRACETAM 500 MG PO TABS
1000.0000 mg | ORAL_TABLET | Freq: Two times a day (BID) | ORAL | Status: DC
  Administered 2014-07-31 – 2014-08-03 (×7): 1000 mg via ORAL
  Filled 2014-07-31 (×7): qty 2

## 2014-07-31 MED ORDER — ACETAMINOPHEN 325 MG PO TABS
650.0000 mg | ORAL_TABLET | Freq: Four times a day (QID) | ORAL | Status: DC | PRN
  Administered 2014-07-31 – 2014-08-01 (×3): 650 mg via ORAL
  Filled 2014-07-31 (×3): qty 2

## 2014-07-31 MED ORDER — SODIUM CHLORIDE 0.9 % IV BOLUS (SEPSIS)
1000.0000 mL | Freq: Once | INTRAVENOUS | Status: AC
  Administered 2014-07-31: 1000 mL via INTRAVENOUS

## 2014-07-31 MED ORDER — VANCOMYCIN HCL IN DEXTROSE 1-5 GM/200ML-% IV SOLN
1000.0000 mg | Freq: Two times a day (BID) | INTRAVENOUS | Status: DC
  Administered 2014-07-31 – 2014-08-02 (×4): 1000 mg via INTRAVENOUS
  Filled 2014-07-31 (×6): qty 200

## 2014-07-31 MED ORDER — ONDANSETRON HCL 4 MG PO TABS: 4.0000 mg | ORAL_TABLET | Freq: Four times a day (QID) | ORAL | Status: DC | PRN

## 2014-07-31 MED ORDER — DEXTROSE 5 % IV SOLN
2.0000 g | Freq: Once | INTRAVENOUS | Status: AC
  Administered 2014-07-31: 2 g via INTRAVENOUS
  Filled 2014-07-31: qty 2

## 2014-07-31 MED ORDER — ACETAMINOPHEN 325 MG PO TABS
650.0000 mg | ORAL_TABLET | Freq: Once | ORAL | Status: AC
  Administered 2014-07-31: 650 mg via ORAL
  Filled 2014-07-31: qty 2

## 2014-07-31 MED ORDER — FLUOXETINE HCL 20 MG PO CAPS
80.0000 mg | ORAL_CAPSULE | Freq: Every day | ORAL | Status: DC
  Administered 2014-07-31 – 2014-08-03 (×4): 80 mg via ORAL
  Filled 2014-07-31 (×4): qty 4

## 2014-07-31 MED ORDER — SODIUM CHLORIDE 0.9 % IV BOLUS (SEPSIS)
2000.0000 mL | Freq: Once | INTRAVENOUS | Status: AC
  Administered 2014-07-31: 2000 mL via INTRAVENOUS

## 2014-07-31 MED ORDER — SODIUM CHLORIDE 0.9 % IJ SOLN
3.0000 mL | Freq: Two times a day (BID) | INTRAMUSCULAR | Status: DC
  Administered 2014-07-31 – 2014-08-03 (×4): 3 mL via INTRAVENOUS

## 2014-07-31 MED ORDER — TRAZODONE HCL 50 MG PO TABS
25.0000 mg | ORAL_TABLET | Freq: Every evening | ORAL | Status: DC | PRN
  Administered 2014-07-31: 25 mg via ORAL

## 2014-07-31 MED ORDER — VANCOMYCIN HCL 10 G IV SOLR
1500.0000 mg | Freq: Once | INTRAVENOUS | Status: AC
  Administered 2014-07-31: 1500 mg via INTRAVENOUS
  Filled 2014-07-31: qty 1500

## 2014-07-31 MED ORDER — ONDANSETRON HCL 4 MG/2ML IJ SOLN
4.0000 mg | Freq: Four times a day (QID) | INTRAMUSCULAR | Status: DC | PRN
  Administered 2014-08-02: 4 mg via INTRAVENOUS
  Filled 2014-07-31: qty 2

## 2014-07-31 MED ORDER — NAPHAZOLINE HCL 0.1 % OP SOLN
1.0000 [drp] | Freq: Four times a day (QID) | OPHTHALMIC | Status: DC | PRN
  Filled 2014-07-31: qty 15

## 2014-07-31 NOTE — ED Notes (Signed)
Pt. Denies n/v/d

## 2014-07-31 NOTE — ED Notes (Signed)
CRITICAL VALUE ALERT  Critical value received:  Lactic Acid 2.4  Date of notification:  07/31/14  Time of notification:  2376  Critical value read back:Yes.    Nurse who received alert:  S. Shanicqua Coldren RN  MD notified (1st page):  Dr. Jeneen Rinks  Time of first page:  1054  MD notified (2nd page):  Time of second page:  Responding MD:  Dr. Jeneen Rinks  Time MD responded:  1055

## 2014-07-31 NOTE — ED Notes (Signed)
NP at bedside.

## 2014-07-31 NOTE — H&P (Signed)
[facility] History and Physical  Angela Michael BSJ:[id number] DOB: 1960/12/24 DOA: 07/31/2014  Referring physician:  PCP: Chevis Pretty, FNP   Chief Complaint: fever/sore throat  HPI: Angela Michael is a 54 y.o. 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 Initial evaluation in the emergency department reveals neutropenia with fever, thrombocytopenia, mild hypokalemia hypotension and hypoxia. Information is obtained from the patient. She was in her usual state of health until about 3 days ago she developed a sore throat and cough. Associated symptoms include generalized weakness, body aches, nausea with no vomiting, fever. She reports that swallowing makes the pain worse nothing makes the symptoms better. She rates the pain in her throat about an 8 out of 10 with swallowing. Describes it as a scratching burning feeling. She denies chest pain palpitations headache dizziness syncope or near-syncope. She denies decreased by mouth intake abdominal pain diarrhea or constipation. He denies any recent travel or sick contacts. Workup in the emergency department reveals oral temperature of 105, BBC 0.7, hemoglobin 11.7, platelets 93, potassium 3.4 serum glucose 148. Lactic acid 2.4, rapid strep test is negative chest x-ray without acute abnormality. EKG with sinus rhythm and borderline T-wave abnormalities urinalysis with protein many squama this epithelial cells WBCs 3-6 RBC 7-10 bacteria many.  In the emergency department she is given 4 L of IV fluids vancomycin and cefepime as well as Toradol and Zofran. At the time of my exam she appears ill but not toxic she is hemodynamically stable with a blood pressure on the low end of normal she is not hypoxic  on 2 L nasal cannula  Review of Systems:  10 point review of systems completed and all systems are negative except as indicated in the history of present illness Past Medical History  Diagnosis Date  . History of syncope   . Depression   . PVC's (premature ventricular contractions)   . Chronic pain   . Chronic back pain   . Chronic neck pain   . Peripheral edema   . Hyperlipidemia   . Anemia   . Neuropathy   . SVT (supraventricular tachycardia)   . Diabetes mellitus without complication 7/65/4650    patient denies being diabetic, no meds  . Lymphoma   . Seizures    Past Surgical History  Procedure Laterality Date  . Gastric bypass      Says she had lost 345 lbs  . Venous thrombectomy      Blood clot resection from right arm  . Lymph node dissection Left     Resected  . Skin surgery      Removal of excess skin  . Vena cava filter placement    . Lymph node removed from stomach    . Cholecystectomy    . Cataract extraction Right   . Endovenous ablation saphenous vein w/ laser Right 09-08-2013    EVLA right small saphenous vein and stab phlebectomy 10-20 incisions right leg by Curt Jews MD  . Medial partial knee replacement  05/10/14    left knee   Social History:  reports that she has never smoked. She has never used smokeless tobacco. She reports that she drinks alcohol. She reports that she does not use illicit drugs. Use married she lives at home with her husband or daughter  grandchild. She is independent with ADLs Allergies  Allergen Reactions  . Advicor [Niacin-Lovastatin Er] Rash    Family History  Problem Relation Age of Onset  . Coronary artery disease Neg Hx   . Sudden death Neg Hx   . Cardiomyopathy Neg Hx   . Esophageal cancer Neg Hx   . Stomach cancer Neg Hx   . Liver cancer Father   . Diabetes Father   . Kidney cancer Father   . Heart failure Brother   . Heart attack Brother   . Tuberculosis Maternal Grandmother   . Bone cancer Maternal  Grandfather   . Diabetes Paternal Uncle     Prior to Admission medications   Medication Sig Start Date End Date Taking? Authorizing Provider  Ascorbic Acid (VITAMIN C) 1000 MG tablet Take 1,000 mg by mouth daily.    Yes Historical Provider, MD  atorvastatin (LIPITOR) 40 MG tablet Take 1 tablet (40 mg total) by mouth at bedtime. 06/23/14  Yes Mary-Margaret Hassell Done, FNP  cyanocobalamin (,VITAMIN B-12,) 1000 MCG/ML injection Inject 1 mL (1,000 mcg total) into the muscle every 30 (thirty) days. 12/05/13  Yes Mary-Margaret Hassell Done, FNP  Fe Fum-FePoly-Vit C-Vit B3 (INTEGRA) 62.5-62.5-40-3 MG CAPS TAKE ONE CAPSULE BY MOUTH EVERY DAY 06/23/14  Yes Mary-Margaret Hassell Done, FNP  fentaNYL (DURAGESIC - DOSED MCG/HR) 75 MCG/HR Place 1 patch (75 mcg total) onto the skin every 3 (three) days. 06/23/14  Yes Mary-Margaret Hassell Done, FNP  FLUoxetine (PROZAC) 40 MG capsule Take 2 capsules (80 mg total) by mouth daily. 06/23/14  Yes Mary-Margaret Hassell Done, FNP  fluticasone (FLONASE) 50 MCG/ACT nasal spray PLACE 2 SPRAYS INTO THE NOSE AS NEEDED. FOR ALLERGIES 06/28/14  Yes Chipper Herb, MD  furosemide (LASIX) 40 MG tablet TAKE 1 TABLET (40 MG TOTAL) BY MOUTH DAILY. 06/23/14  Yes Mary-Margaret Hassell Done, FNP  gabapentin (NEURONTIN) 300 MG capsule TAKE 2 CAPSULE BY MOUTH 2 TIMES DAILY 06/23/14  Yes Mary-Margaret Hassell Done, FNP  HYDROcodone-acetaminophen (NORCO) 10-325 MG per tablet Take 1 tablet by mouth every 4 (four) hours as needed for moderate pain.  05/25/14  Yes Historical Provider, MD  levETIRAcetam (KEPPRA) 500 MG tablet TAKE 2 TABLETS BY MOUTH 2 TIMES DAILY 06/23/14  Yes Mary-Margaret Hassell Done, FNP  loratadine (CLARITIN) 10 MG tablet Take 10 mg by mouth as needed. For allergies   Yes Historical Provider, MD  Multiple Vitamin (MULTIVITAMIN WITH MINERALS) TABS Take 1 tablet by mouth daily.   Yes Historical Provider, MD  Multiple Vitamins-Minerals (MULTIVITAMINS THER. W/MINERALS) TABS tablet Take 1 tablet by mouth daily.   Yes Historical  Provider, MD  nitroGLYCERIN (NITROSTAT) 0.4 MG SL tablet Place 1 tablet (0.4 mg total) under the tongue every 5 (five) minutes as needed for chest pain. 03/23/14  Yes Mary-Margaret Hassell Done, FNP  Nutritional Supplements (ESTROVEN PMS) TABS Take 1 tablet by mouth daily.    Yes Historical Provider, MD  potassium chloride SA (K-DUR,KLOR-CON) 20 MEQ tablet Take 1 tablet (20 mEq total) by mouth 2 (two) times daily. 06/23/14  Yes Mary-Margaret Hassell Done, FNP  tetrahydrozoline 0.05 % ophthalmic solution Apply 1 drop to eye.   Yes Historical Provider, MD  XARELTO 10 MG TABS tablet Take 1 tablet (10 mg total) by mouth daily. 06/23/14  Yes Mary-Margaret Hassell Done, FNP   Physical Exam: Filed Vitals:   07/31/14 1057 07/31/14 1100 07/31/14 1130 07/31/14 1230  BP:  101/56 101/65 100/45  Pulse:  83 88 78  Temp:      TempSrc:      Resp:  17  17 14  Height:      Weight:      SpO2: 94% 96% 95% 99%    Wt Readings from Last 3 Encounters:  07/31/14 97.977 kg (216 lb)  09/15/13 92.534 kg (204 lb)  09/08/13 92.534 kg (204 lb)    General:  Somewhat ill appearing but not toxic. Obese Eyes: PERRL, normal lids, irises & conjunctiva, no scleral icterus ENT: grossly normal hearing, lips & tongue, mucous membranes of her mouth are slightly pale but pink oropharynx without erythema or exudate Neck: no LAD, masses or thyromegaly Cardiovascular: RRR, no m/r/g. No LE edema.  Respiratory: Normal effort breath sounds are somewhat diminished I hear a few rhonchi no crackles no wheezes Abdomen: soft, , soft obese nondistended positive bowel sounds Skin: no rash or induration, healing scar on her left knee Musculoskeletal: grossly normal tone BUE/BLE Psychiatric: grossly normal mood and affect, speech fluent and appropriate Neurologic: grossly non-focal. Speech clear           Labs on Admission:  Basic Metabolic Panel:  Recent Labs Lab 07/31/14 0926  NA 138  K 3.4*  CL 103  CO2 26  GLUCOSE 148*  BUN 15    CREATININE 0.97  CALCIUM 8.4   Liver Function Tests:  Recent Labs Lab 07/31/14 0926  AST 23  ALT 23  ALKPHOS 85  BILITOT 1.9*  PROT 6.8  ALBUMIN 3.7   No results for input(s): LIPASE, AMYLASE in the last 168 hours. No results for input(s): AMMONIA in the last 168 hours. CBC:  Recent Labs Lab 07/31/14 0926  WBC 0.7*  NEUTROABS 0.1*  HGB 11.7*  HCT 35.1*  MCV 92.6  PLT 93*   Cardiac Enzymes: No results for input(s): CKTOTAL, CKMB, CKMBINDEX, TROPONINI in the last 168 hours.  BNP (last 3 results) No results for input(s): BNP in the last 8760 hours.  ProBNP (last 3 results) No results for input(s): PROBNP in the last 8760 hours.  CBG: No results for input(s): GLUCAP in the last 168 hours.  Radiological Exams on Admission: Dg Chest 2 View  07/31/2014   CLINICAL DATA:  Fever and cough and back pain.  Sore throat.  EXAM: CHEST  2 VIEW  COMPARISON:  None.  FINDINGS: The heart size and mediastinal contours are within normal limits. Both lungs are clear. The visualized skeletal structures are unremarkable.  IMPRESSION: No acute abnormalities.   Electronically Signed   By: Lorriane Shire M.D.   On: 07/31/2014 10:58    EKG: Independently reviewed sinus rhythm  Assessment/Plan Principal Problem:   Neutropenia with fever: Etiology unclear. Chest x-ray urinalysis unremarkable. differential includes medications, infection, recurrence of cancer. Will admit to telemetry. Will continue the cefepime and vancomycin initiated in the emergency. Keppra can cause but not likely offender as she has been on this medication for years. Will  continue fluid resuscitation, Tylenol. Exam revealed tick bite. Will obtain parvo B19 and add doxy to antibiotic regimen.  Await blood cultures. Request hematology consult. Active Problems: SIRS: Temperature 105, oxygen saturation level 88% on room air, hypotension. In 10 you fluid resuscitation. Will hold her home Lasix. Monitor closely.     Convulsions/seizures: Chart review indicates no seizure activity in many years. Will continue her Keppra at home dose    Anemia: Mild. Will obtain an anemia panel.    Hypokalemia: Mild. She is on Lasix daily in setting of running a fever. Will replete and recheck. Will check a mag level as well    SVT (supraventricular  tachycardia): History of same. Home medications include Xarelto. I will hold this given her platelet count. Chart review indicates last visit with cardiology a year ago. She denies chest pain palpitation shortness of breath lightheadedness. Recommended 81 mg of aspirin and Lipitor    Morbid obesity: BMI 40.9. Status post gastric bypass surgery many years ago.    Hypoxia: Related to #2. Oxygen saturation levels greater than 90% on 2 L nasal cannula. Treatment as indicated in one and 2      Code Status: full DVT Prophylaxis: Family Communication: husband at bedside Disposition Plan: home when ready  Time spent: 60 minutes  [facility] Pager [phone]

## 2014-07-31 NOTE — ED Notes (Signed)
Pt reports generalized body aches/ fever/ cough since Saturday; reports sore throat started this morning.

## 2014-07-31 NOTE — Care Management Utilization Note (Signed)
UR completed 

## 2014-07-31 NOTE — ED Provider Notes (Signed)
CSN: [id number]     Arrival date & time 07/31/14  0349 History  This chart was scribed for Angela Furry, MD by Molli Posey, ED Scribe. This patient was seen in room APA11/APA11 and the patient's care was started 9:38 AM.   Chief Complaint  Patient presents with  . sore throat, fever    The history is provided by the patient. No language interpreter was used.   HPI Comments: Angela Michael is a 54 y.o. female with a history of chornic back pain, hyperlipidemia and DM who presents to the Emergency Department complaining of sore throat for the last 3 days. She states that swallowing aggravates her sore throat. Pt reports associated generalized body aches, mild nausea, mild dysuria, fever, cough and back pain. She states her maximum temperature was 105 taken in the ED today. Pt reports she was on chemotherapy which ended in January 2015 for lymphoma. She reports she received a flu vaccination this year. She states she has not taken motrin or tylenol today. Pt denies vomiting and diarrhea.   Past Medical History  Diagnosis Date  . History of syncope   . Depression   . PVC's (premature ventricular contractions)   . Chronic pain   . Chronic back pain   . Chronic neck pain   . Peripheral edema   . Hyperlipidemia   . Anemia   . Neuropathy   . SVT (supraventricular tachycardia)   . Diabetes mellitus without complication 1/79/1505    patient denies being diabetic, no meds  . Lymphoma   . Seizures    Past Surgical History  Procedure Laterality Date  . Gastric bypass      Says she had lost 345 lbs  . Venous thrombectomy      Blood clot resection from right arm  . Lymph node dissection Left     Resected  . Skin surgery      Removal of excess skin  . Vena cava filter placement    . Lymph node removed from stomach    . Cholecystectomy    . Cataract extraction Right   . Endovenous ablation saphenous vein w/ laser Right 09-08-2013    EVLA right small saphenous vein and stab phlebectomy  10-20 incisions right leg by Curt Jews MD  . Medial partial knee replacement  05/10/14    left knee   Family History  Problem Relation Age of Onset  . Coronary artery disease Neg Hx   . Sudden death Neg Hx   . Cardiomyopathy Neg Hx   . Esophageal cancer Neg Hx   . Stomach cancer Neg Hx   . Liver cancer Father   . Diabetes Father   . Kidney cancer Father   . Heart failure Brother   . Heart attack Brother   . Tuberculosis Maternal Grandmother   . Bone cancer Maternal Grandfather   . Diabetes Paternal Uncle    History  Substance Use Topics  . Smoking status: Never Smoker   . Smokeless tobacco: Never Used  . Alcohol Use: Yes     Comment: Occasionally   OB History    Gravida Para Term Preterm AB TAB SAB Ectopic Multiple Living   2 2 2             Review of Systems  Constitutional: Positive for fever and diaphoresis. Negative for chills and appetite change.  HENT: Positive for ear pain and sore throat. Negative for mouth sores and trouble swallowing.   Eyes: Negative for visual disturbance.  Respiratory:  Positive for cough. Negative for chest tightness, shortness of breath and wheezing.   Cardiovascular: Negative for chest pain.  Gastrointestinal: Positive for nausea and abdominal pain. Negative for vomiting, diarrhea and abdominal distention.  Endocrine: Negative for polydipsia, polyphagia and polyuria.  Genitourinary: Positive for dysuria. Negative for frequency and hematuria.  Musculoskeletal: Positive for myalgias and back pain. Negative for gait problem.  Skin: Negative for color change, pallor and rash.  Neurological: Negative for dizziness, syncope, light-headedness and headaches.  Hematological: Does not bruise/bleed easily.  Psychiatric/Behavioral: Negative for behavioral problems and confusion.   Allergies  Advicor  Home Medications   Prior to Admission medications   Medication Sig Start Date End Date Taking? Authorizing Provider  Ascorbic Acid (VITAMIN C)  1000 MG tablet Take 1,000 mg by mouth daily.    Yes Historical Provider, MD  atorvastatin (LIPITOR) 40 MG tablet Take 1 tablet (40 mg total) by mouth at bedtime. 06/23/14  Yes Mary-Margaret Hassell Done, FNP  cyanocobalamin (,VITAMIN B-12,) 1000 MCG/ML injection Inject 1 mL (1,000 mcg total) into the muscle every 30 (thirty) days. 12/05/13  Yes Mary-Margaret Hassell Done, FNP  Fe Fum-FePoly-Vit C-Vit B3 (INTEGRA) 62.5-62.5-40-3 MG CAPS TAKE ONE CAPSULE BY MOUTH EVERY DAY 06/23/14  Yes Mary-Margaret Hassell Done, FNP  fentaNYL (DURAGESIC - DOSED MCG/HR) 75 MCG/HR Place 1 patch (75 mcg total) onto the skin every 3 (three) days. 06/23/14  Yes Mary-Margaret Hassell Done, FNP  FLUoxetine (PROZAC) 40 MG capsule Take 2 capsules (80 mg total) by mouth daily. 06/23/14  Yes Mary-Margaret Hassell Done, FNP  fluticasone (FLONASE) 50 MCG/ACT nasal spray PLACE 2 SPRAYS INTO THE NOSE AS NEEDED. FOR ALLERGIES 06/28/14  Yes Chipper Herb, MD  furosemide (LASIX) 40 MG tablet TAKE 1 TABLET (40 MG TOTAL) BY MOUTH DAILY. 06/23/14  Yes Mary-Margaret Hassell Done, FNP  gabapentin (NEURONTIN) 300 MG capsule TAKE 2 CAPSULE BY MOUTH 2 TIMES DAILY 06/23/14  Yes Mary-Margaret Hassell Done, FNP  HYDROcodone-acetaminophen (NORCO) 10-325 MG per tablet Take 1 tablet by mouth every 4 (four) hours as needed for moderate pain.  05/25/14  Yes Historical Provider, MD  levETIRAcetam (KEPPRA) 500 MG tablet TAKE 2 TABLETS BY MOUTH 2 TIMES DAILY 06/23/14  Yes Mary-Margaret Hassell Done, FNP  loratadine (CLARITIN) 10 MG tablet Take 10 mg by mouth as needed. For allergies   Yes Historical Provider, MD  Multiple Vitamin (MULTIVITAMIN WITH MINERALS) TABS Take 1 tablet by mouth daily.   Yes Historical Provider, MD  Multiple Vitamins-Minerals (MULTIVITAMINS THER. W/MINERALS) TABS tablet Take 1 tablet by mouth daily.   Yes Historical Provider, MD  nitroGLYCERIN (NITROSTAT) 0.4 MG SL tablet Place 1 tablet (0.4 mg total) under the tongue every 5 (five) minutes as needed for chest pain. 03/23/14  Yes  Mary-Margaret Hassell Done, FNP  Nutritional Supplements (ESTROVEN PMS) TABS Take 1 tablet by mouth daily.    Yes Historical Provider, MD  potassium chloride SA (K-DUR,KLOR-CON) 20 MEQ tablet Take 1 tablet (20 mEq total) by mouth 2 (two) times daily. 06/23/14  Yes Mary-Margaret Hassell Done, FNP  tetrahydrozoline 0.05 % ophthalmic solution Apply 1 drop to eye.   Yes Historical Provider, MD  XARELTO 10 MG TABS tablet Take 1 tablet (10 mg total) by mouth daily. 06/23/14  Yes Mary-Margaret Hassell Done, FNP   BP 101/65 mmHg  Pulse 88  Temp(Src) 105 F (40.6 C) (Rectal)  Resp 17  Ht 5\' 1"  (1.549 m)  Wt 216 lb (97.977 kg)  BMI 40.83 kg/m2  SpO2 95% Physical Exam  Constitutional: She is oriented to person, place, and time. She appears  well-developed and well-nourished. No distress.  HENT:  Head: Normocephalic.  Eyes: Conjunctivae are normal. Pupils are equal, round, and reactive to light. No scleral icterus.  Neck: Normal range of motion. Neck supple. No thyromegaly present.  Cardiovascular: Normal rate and regular rhythm.  Exam reveals no gallop and no friction rub.   No murmur heard. Pulmonary/Chest: Effort normal. No respiratory distress. She has no wheezes. She has rhonchi. She has no rales.  Few rhonchi.  Abdominal: Soft. Bowel sounds are normal. She exhibits no distension. There is no tenderness. There is no rebound.  Mild mid abdominal tenderness but no peritoneal irritation   Musculoskeletal: Normal range of motion.  Neurological: She is alert and oriented to person, place, and time.  Skin: Skin is warm and dry. No rash noted.  Psychiatric: She has a normal mood and affect. Her behavior is normal.  Nursing note and vitals reviewed.   ED Course  Procedures   DIAGNOSTIC STUDIES: Oxygen Saturation is 93% on RA, normal by my interpretation.    COORDINATION OF CARE: 9:44 AM Discussed treatment plan with pt at bedside and pt agreed to plan.   Labs Review Labs Reviewed  COMPREHENSIVE METABOLIC  PANEL - Abnormal; Notable for the following:    Potassium 3.4 (*)    Glucose, Bld 148 (*)    Total Bilirubin 1.9 (*)    GFR calc non Af Amer 66 (*)    GFR calc Af Amer 76 (*)    All other components within normal limits  CBC WITH DIFFERENTIAL/PLATELET - Abnormal; Notable for the following:    WBC 0.7 (*)    RBC 3.79 (*)    Hemoglobin 11.7 (*)    HCT 35.1 (*)    Platelets 93 (*)    Neutrophils Relative % 11 (*)    Neutro Abs 0.1 (*)    Lymphocytes Relative 59 (*)    Lymphs Abs 0.4 (*)    Monocytes Relative 25 (*)    All other components within normal limits  URINALYSIS, ROUTINE W REFLEX MICROSCOPIC - Abnormal; Notable for the following:    APPearance HAZY (*)    Protein, ur 30 (*)    Urobilinogen, UA 4.0 (*)    All other components within normal limits  URINE MICROSCOPIC-ADD ON - Abnormal; Notable for the following:    Squamous Epithelial / LPF MANY (*)    Bacteria, UA MANY (*)    All other components within normal limits  LACTIC ACID, PLASMA - Abnormal; Notable for the following:    Lactic Acid, Venous 2.4 (*)    All other components within normal limits  RAPID STREP SCREEN  CULTURE, BLOOD (ROUTINE X 2)  CULTURE, GROUP A STREP  CULTURE, BLOOD (ROUTINE X 2)  LACTIC ACID, PLASMA  INFLUENZA PANEL BY PCR (TYPE A & B, H1N1)    Imaging Review Dg Chest 2 View  07/31/2014   CLINICAL DATA:  Fever and cough and back pain.  Sore throat.  EXAM: CHEST  2 VIEW  COMPARISON:  None.  FINDINGS: The heart size and mediastinal contours are within normal limits. Both lungs are clear. The visualized skeletal structures are unremarkable.  IMPRESSION: No acute abnormalities.   Electronically Signed   By: Lorriane Shire M.D.   On: 07/31/2014 10:58     EKG Interpretation None      MDM   Final diagnoses:  Cough  Fever  Neutropenia    Patient stable. Not hypoxemic. Minimal tachycardia resolves after Tylenol and fluids. Lactate 2.4. No  obvious source of infection clinically, or for  studies. Marked neutropenia. Her blood cell count 700, 11% neutrophils, ANC 77. No obvious etiology for her neutropenia. Minimal anemia of 11.7, thrombocytopenia and 93. Given Maxipime, and Vancomycin. Discussed with Dr. Truman Hayward. Patient will be admitted for febrile neutropenia.     Angela Furry, MD 07/31/14 [phone]

## 2014-07-31 NOTE — ED Notes (Signed)
CRITICAL VALUE ALERT  Critical value received:  WBC 0.7  Date of notification:  07/31/14  Time of notification:  6861  Critical value read back:Yes.    Nurse who received alert:  Chilton Si RN  MD notified (1st page):  Dr. Jeneen Rinks  Time of first page:  55  MD notified (2nd page):  Time of second page:  Responding MD:  Dr. Jeneen Rinks  Time MD responded:  Dr. Jeneen Rinks

## 2014-07-31 NOTE — ED Notes (Signed)
Pt also reports dysuria while collecting UA. States this just started this morning.

## 2014-07-31 NOTE — Progress Notes (Addendum)
ANTIBIOTIC CONSULT NOTE - INITIAL  Pharmacy Consult for Vancomycin and Cefepime Indication: pneumonia and neutropenic fever  Allergies  Allergen Reactions  . Advicor [Niacin-Lovastatin Er] Rash   Patient Measurements: Height: 5\' 1"  (154.9 cm) Weight: 216 lb (97.977 kg) IBW/kg (Calculated) : 47.8  Vital Signs: Temp: 101.1 F (38.4 C) (04/18 1248) Temp Source: Rectal (04/18 1248) BP: 100/45 mmHg (04/18 1230) Pulse Rate: 78 (04/18 1230) Intake/Output from previous day:   Intake/Output from this shift:    Labs:  Recent Labs  07/31/14 0926  WBC 0.7*  HGB 11.7*  PLT 93*  CREATININE 0.97   Estimated Creatinine Clearance: 72.7 mL/min (by C-G formula based on Cr of 0.97). No results for input(s): VANCOTROUGH, VANCOPEAK, VANCORANDOM, GENTTROUGH, GENTPEAK, GENTRANDOM, TOBRATROUGH, TOBRAPEAK, TOBRARND, AMIKACINPEAK, AMIKACINTROU, AMIKACIN in the last 72 hours.   Microbiology: Recent Results (from the past 720 hour(s))  Rapid strep screen     Status: None   Collection Time: 07/31/14  9:20 AM  Result Value Ref Range Status   Streptococcus, Group A Screen (Direct) NEGATIVE NEGATIVE Final    Comment: (NOTE) A Rapid Antigen test may result negative if the antigen level in the sample is below the detection level of this test. The FDA has not cleared this test as a stand-alone test therefore the rapid antigen negative result has reflexed to a Group A Strep culture.   Blood culture (routine x 2)     Status: None (Preliminary result)   Collection Time: 07/31/14 10:30 AM  Result Value Ref Range Status   Specimen Description BLOOD LEFT HAND  Final   Special Requests   Final    BOTTLES DRAWN AEROBIC AND ANAEROBIC 5CC  IMMUNE:COMPROMISED   Culture PENDING  Incomplete   Report Status PENDING  Incomplete  Blood culture (routine x 2)     Status: None (Preliminary result)   Collection Time: 07/31/14 10:30 AM  Result Value Ref Range Status   Specimen Description BLOOD RIGHT HAND  Final    Special Requests   Final    BOTTLES DRAWN AEROBIC AND ANAEROBIC AEB=6CC ANA=4CC  IMMUNE:COMPROMISED   Culture PENDING  Incomplete   Report Status PENDING  Incomplete    Medical History: Past Medical History  Diagnosis Date  . History of syncope   . Depression   . PVC's (premature ventricular contractions)   . Chronic pain   . Chronic back pain   . Chronic neck pain   . Peripheral edema   . Hyperlipidemia   . Anemia   . Neuropathy   . SVT (supraventricular tachycardia)   . Diabetes mellitus without complication 2/42/3536    patient denies being diabetic, no meds  . Lymphoma   . Seizures    Anti-infectives    Start     Dose/Rate Route Frequency Ordered Stop   07/31/14 1100  vancomycin (VANCOCIN) 1,500 mg in sodium chloride 0.9 % 500 mL IVPB    Comments:  * Loading dose to be given in ED *   1,500 mg 250 mL/hr over 120 Minutes Intravenous  Once 07/31/14 1038 07/31/14 1338   07/31/14 1045  ceFEPIme (MAXIPIME) 2 g in dextrose 5 % 50 mL IVPB     2 g 100 mL/hr over 30 Minutes Intravenous  Once 07/31/14 1036 07/31/14 1132     Assessment: 54yo female admitted with c/o fever and sore throat.  Asked to initiate Vancomycin and Cefepime for neutropenic fever and suspected pna.  Estimated Creatinine Clearance: 72.7 mL/min (by C-G formula based on Cr  of 0.97).  Pt received initial loading dose of Vancomycin 1500mg  and Cefepime 2gm in ED on admission.  Goal of Therapy:  Vancomycin trough level 15-20 mcg/ml  Plan:   Vancomycin 1000mg  IV q12hrs starting tonight  Check trough at steady state  Cefepime 1gm IV q8hrs starting tonight  Monitor labs, renal fxn, and cultures  Hart Robinsons A 07/31/2014,1:38 PM

## 2014-08-01 ENCOUNTER — Inpatient Hospital Stay (HOSPITAL_COMMUNITY): Payer: Medicaid Other

## 2014-08-01 DIAGNOSIS — C8290 Follicular lymphoma, unspecified, unspecified site: Secondary | ICD-10-CM

## 2014-08-01 DIAGNOSIS — Z9884 Bariatric surgery status: Secondary | ICD-10-CM

## 2014-08-01 DIAGNOSIS — R5081 Fever presenting with conditions classified elsewhere: Secondary | ICD-10-CM

## 2014-08-01 DIAGNOSIS — D649 Anemia, unspecified: Secondary | ICD-10-CM

## 2014-08-01 DIAGNOSIS — D696 Thrombocytopenia, unspecified: Secondary | ICD-10-CM | POA: Diagnosis present

## 2014-08-01 DIAGNOSIS — D61818 Other pancytopenia: Secondary | ICD-10-CM

## 2014-08-01 DIAGNOSIS — D709 Neutropenia, unspecified: Principal | ICD-10-CM

## 2014-08-01 LAB — COMPREHENSIVE METABOLIC PANEL
ALBUMIN: 2.6 g/dL — AB (ref 3.5–5.2)
ALT: 20 U/L (ref 0–35)
AST: 20 U/L (ref 0–37)
Alkaline Phosphatase: 54 U/L (ref 39–117)
Anion gap: 5 (ref 5–15)
BUN: 14 mg/dL (ref 6–23)
CHLORIDE: 110 mmol/L (ref 96–112)
CO2: 25 mmol/L (ref 19–32)
Calcium: 7.5 mg/dL — ABNORMAL LOW (ref 8.4–10.5)
Creatinine, Ser: 0.65 mg/dL (ref 0.50–1.10)
GFR calc Af Amer: >90 mL/min (ref >90)
GFR calc non Af Amer: >90 mL/min (ref >90)
Glucose, Bld: 90 mg/dL (ref 70–99)
POTASSIUM: 3.8 mmol/L (ref 3.5–5.1)
Sodium: 140 mmol/L (ref 135–145)
Total Bilirubin: 1.3 mg/dL — ABNORMAL HIGH (ref 0.3–1.2)
Total Protein: 5.2 g/dL — ABNORMAL LOW (ref 6.0–8.3)

## 2014-08-01 LAB — FOLATE: Folate: 11.8 ng/mL

## 2014-08-01 LAB — CBC
HCT: 27.8 % — ABNORMAL LOW (ref 36.0–46.0)
Hemoglobin: 9.2 g/dL — ABNORMAL LOW (ref 12.0–15.0)
MCH: 31.1 pg (ref 26.0–34.0)
MCHC: 33.1 g/dL (ref 30.0–36.0)
MCV: 93.9 fL (ref 78.0–100.0)
Platelets: 74 10*3/uL — ABNORMAL LOW (ref 150–400)
RBC: 2.96 MIL/uL — AB (ref 3.87–5.11)
RDW: 12.2 % (ref 11.5–15.5)
WBC: 2.1 10*3/uL — ABNORMAL LOW (ref 4.0–10.5)

## 2014-08-01 LAB — PARVOVIRUS B19 ANTIBODY, IGG AND IGM
Parovirus B19 IgG Abs: 4.4 index — ABNORMAL HIGH (ref 0.0–0.8)
Parovirus B19 IgM Abs: 0.2 index (ref 0.0–0.8)

## 2014-08-01 LAB — C-REACTIVE PROTEIN: CRP: 24.6 mg/dL — AB (ref <0.60)

## 2014-08-01 LAB — IRON AND TIBC
Iron: 27 ug/dL — ABNORMAL LOW (ref 42–145)
SATURATION RATIOS: 14 % — AB (ref 20–55)
TIBC: 190 ug/dL — ABNORMAL LOW (ref 250–470)
UIBC: 163 ug/dL (ref 125–400)

## 2014-08-01 LAB — RETICULOCYTES
RBC.: 2.97 MIL/uL — ABNORMAL LOW (ref 3.87–5.11)
Retic Count, Absolute: 83.2 10*3/uL (ref 19.0–186.0)
Retic Ct Pct: 2.8 % (ref 0.4–3.1)

## 2014-08-01 LAB — VITAMIN B12: Vitamin B-12: 759 pg/mL (ref 211–911)

## 2014-08-01 LAB — SEDIMENTATION RATE: SED RATE: 42 mm/h — AB (ref 0–22)

## 2014-08-01 LAB — HEMOGLOBIN A1C
Hgb A1c MFr Bld: 5.5 % (ref 4.8–5.6)
Mean Plasma Glucose: 111 mg/dL

## 2014-08-01 LAB — LACTATE DEHYDROGENASE: LDH: 152 U/L (ref 94–250)

## 2014-08-01 LAB — FERRITIN: Ferritin: 442 ng/mL — ABNORMAL HIGH (ref 10–291)

## 2014-08-01 MED ORDER — IOHEXOL 300 MG/ML  SOLN
150.0000 mL | Freq: Once | INTRAMUSCULAR | Status: AC | PRN
  Administered 2014-08-01: 150 mL via INTRAVENOUS

## 2014-08-01 NOTE — Consult Note (Signed)
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 Karle Starch.  HISTORY OF PRESENT ILLNESS:   Angela Michael is a 54 year old white American female with a past medical history significant for morbid obesity having undergone gastric bypass in 2007 at [facility], at which time the diagnosis of follicular lymphoma Grade I-II was made. She presented to the [facility] ED on 07/31/2014 with a fever of 105 F.    Chart is reviewed in detail.  I have reviewed the patient's CareEverywhere chart from a hematologic standpoint: CT CAP on 08/25/2010 incidentally discovered lymphadenopathy with repeat Ct imaging of abdomen on 11/06/2010 demonstrating mildly enlarged mesenteric lymph node measuring 1.1 cm that was felt to be similar in size without growth.  On my review, it appears as though she was followed with surveillance imaging for some time until June 2014 when she underwent a laparoscopic biopsy demonstrating the aforementioned diagnosis.  Bone marrow aspiration and biopsy was negative at that time.   I personally reviewed and went over laboratory results with the patient.  The results are noted within this dictation.  I personally reviewed and went over radiographic studies with the patient.  The results are noted within this dictation.    She denies any B symptoms, but notes fevers with chills yesterday at time of presentation.  She notes that her appetite is chronic low, but stable, due to her gastric bypass.  She has gotten all of her hematologic care at [facility].  According to [facility], Dr.  Nelda Marseille, her lymphoma has been stable and in remission since treatment. Note she was treated with single agent Rituxan with her last dose given in 12/2013.   She underwent CT CAP and neck imaging in November 2015 which was stable without lymphadenopathy. She was last seen at [facility] in February of this year with an unremarkable exam.  Scans are due there again in May.  Labs were essentially unremarkable.  Labs in the ED at [facility] showed a total WBC count of 700 with an ANC of 100, mild thrombocytopenia and anemia.  She denies any preceding viral illness or new medication.   Hematologically, she denies any complaints and ROS questioning is negative.    PAST MEDICAL HISTORY:   Past Medical History  Diagnosis Date  . History of syncope   . Depression   . PVC's (premature ventricular contractions)   . Chronic pain   . Chronic back pain   . Chronic neck pain   . Peripheral edema   . Hyperlipidemia   . Anemia   . Neuropathy   . SVT (supraventricular tachycardia)   . Diabetes mellitus without complication 8/88/2800    patient denies being diabetic, no meds  . Lymphoma   . Seizures     ALLERGIES: Allergies  Allergen Reactions  . Advicor [Niacin-Lovastatin Er] Rash      MEDICATIONS: I have reviewed the patient's current medications.     PAST SURGICAL HISTORY Past Surgical History  Procedure Laterality Date  . Gastric bypass      Says she had lost 345 lbs  .  Venous thrombectomy      Blood clot resection from right arm  . Lymph node dissection Left     Resected  . Skin surgery      Removal of excess skin  . Vena cava filter placement    . Lymph node removed from stomach    . Cholecystectomy    . Cataract extraction Right   . Endovenous ablation saphenous vein w/ laser Right 09-08-2013    EVLA right small saphenous vein and stab phlebectomy 10-20 incisions right leg by Curt Jews MD  . Medial partial knee replacement  05/10/14    left knee    FAMILY HISTORY: Family  History  Problem Relation Age of Onset  . Coronary artery disease Neg Hx   . Sudden death Neg Hx   . Cardiomyopathy Neg Hx   . Esophageal cancer Neg Hx   . Stomach cancer Neg Hx   . Liver cancer Father   . Diabetes Father   . Kidney cancer Father   . Heart failure Brother   . Heart attack Brother   . Tuberculosis Maternal Grandmother   . Bone cancer Maternal Grandfather   . Diabetes Paternal Uncle     SOCIAL HISTORY:  reports that she has never smoked. She has never used smokeless tobacco. She reports that she drinks alcohol. She reports that she does not use illicit drugs.  PERFORMANCE STATUS: The patient's performance status is 2 - Symptomatic, <50% confined to bed  PHYSICAL EXAM: Most Recent Vital Signs: Blood pressure 110/44, pulse 66, temperature 97.8 F (36.6 C), temperature source Oral, resp. rate 18, height _0  (1.549 m), weight 213 lb 13.5 oz (97 kg), SpO2 91 %. General appearance: alert, cooperative, appears older than stated age, no distress and morbidly obese Head: Normocephalic, without obvious abnormality, atraumatic Eyes: negative findings: lids and lashes normal, conjunctivae and sclerae normal and pupils equal, round, reactive to light and accomodation Throat: normal findings: lips normal without lesions, buccal mucosa normal and oropharynx pink & moist without lesions or evidence of thrush Neck: no adenopathy and supple, symmetrical, trachea midline Lungs: clear to auscultation bilaterally Heart: regular rate and rhythm, S1, S2 normal, no murmur, click, rub or gallop Abdomen: normal findings: bowel sounds normal, no masses palpable, soft, non-tender and spleen non-palpable and abnormal findings:  obese Extremities: extremities normal, atraumatic, no cyanosis or edema Skin: Skin color, texture, turgor normal. No rashes or lesions Lymph nodes: Cervical, supraclavicular, and axillary nodes normal. Neurologic: Grossly normal  LABORATORY DATA:  Results for  orders placed or performed during the hospital encounter of 07/31/14 (from the past 48 hour(s))  Rapid strep screen     Status: None   Collection Time: 07/31/14  9:20 AM  Result Value Ref Range   Streptococcus, Group A Screen (Direct) NEGATIVE NEGATIVE    Comment: (NOTE) A Rapid Antigen test may result negative if the antigen level in the sample is below the detection level of this test. The FDA has not cleared this test as a stand-alone test therefore the rapid antigen negative result has reflexed to a Group A Strep culture.   Urinalysis, Routine w reflex microscopic     Status: Abnormal   Collection Time: 07/31/14  9:20 AM  Result Value Ref Range   Color, Urine YELLOW YELLOW   APPearance HAZY (A) CLEAR   Specific Gravity, Urine 1.025 1.005 - 1.030   pH 5.5 5.0 - 8.0   Glucose, UA NEGATIVE NEGATIVE mg/dL   Hgb urine dipstick NEGATIVE NEGATIVE  Bilirubin Urine NEGATIVE NEGATIVE   Ketones, ur NEGATIVE NEGATIVE mg/dL   Protein, ur 30 (A) NEGATIVE mg/dL   Urobilinogen, UA 4.0 (H) 0.0 - 1.0 mg/dL   Nitrite NEGATIVE NEGATIVE   Leukocytes, UA NEGATIVE NEGATIVE  Urine microscopic-add on     Status: Abnormal   Collection Time: 07/31/14  9:20 AM  Result Value Ref Range   Squamous Epithelial / LPF MANY (A) RARE   WBC, UA 3-6 <3 WBC/hpf   RBC / HPF 7-10 <3 RBC/hpf   Bacteria, UA MANY (A) RARE  Magnesium     Status: None   Collection Time: 07/31/14  9:20 AM  Result Value Ref Range   Magnesium 1.6 1.5 - 2.5 mg/dL  TSH     Status: None   Collection Time: 07/31/14  9:20 AM  Result Value Ref Range   TSH 0.697 0.350 - 4.500 uIU/mL  Hemoglobin A1c     Status: None   Collection Time: 07/31/14  9:20 AM  Result Value Ref Range   Hgb A1c MFr Bld 5.5 4.8 - 5.6 %    Comment: (NOTE)         Pre-diabetes: 5.7 - 6.4         Diabetes: >6.4         Glycemic control for adults with diabetes: <7.0    Mean Plasma Glucose 111 mg/dL    Comment: (NOTE) Performed At: [facility] [place], [place] [id number] Lindon Romp MD WU:[id number]   Comprehensive metabolic panel     Status: Abnormal   Collection Time: 07/31/14  9:26 AM  Result Value Ref Range   Sodium 138 135 - 145 mmol/L   Potassium 3.4 (L) 3.5 - 5.1 mmol/L   Chloride 103 96 - 112 mmol/L   CO2 26 19 - 32 mmol/L   Glucose, Bld 148 (H) 70 - 99 mg/dL   BUN 15 6 - 23 mg/dL   Creatinine, Ser 0.97 0.50 - 1.10 mg/dL   Calcium 8.4 8.4 - 10.5 mg/dL   Total Protein 6.8 6.0 - 8.3 g/dL   Albumin 3.7 3.5 - 5.2 g/dL   AST 23 0 - 37 U/L   ALT 23 0 - 35 U/L   Alkaline Phosphatase 85 39 - 117 U/L   Total Bilirubin 1.9 (H) 0.3 - 1.2 mg/dL   GFR calc non Af Amer 66 (L) >90 mL/min   GFR calc Af Amer 76 (L) >90 mL/min    Comment: (NOTE) The eGFR has been calculated using the CKD EPI equation. This calculation has not been validated in all clinical situations. eGFR's persistently <90 mL/min signify possible Chronic Kidney Disease.    Anion gap 9 5 - 15  CBC with Differential     Status: Abnormal   Collection Time: 07/31/14  9:26 AM  Result Value Ref Range   WBC 0.7 (LL) 4.0 - 10.5 K/uL    Comment: RESULT REPEATED AND VERIFIED WHITE COUNT CONFIRMED ON SMEAR CRITICAL RESULT CALLED TO, READ BACK BY AND VERIFIED WITH: SUSAN OWEN RN ON [id number] AT 1005 BY RESSEGGER R    RBC 3.79 (L) 3.87 - 5.11 MIL/uL   Hemoglobin 11.7 (L) 12.0 - 15.0 g/dL   HCT 35.1 (L) 36.0 - 46.0 %   MCV 92.6 78.0 - 100.0 fL   MCH 30.9 26.0 - 34.0 pg   MCHC 33.3 30.0 - 36.0 g/dL   RDW 12.0 11.5 - 15.5 %   Platelets 93 (L) 150 - 400 K/uL  Comment: SPECIMEN CHECKED FOR CLOTS PLATELET COUNT CONFIRMED BY SMEAR    Neutrophils Relative % 11 (L) 43 - 77 %   Neutro Abs 0.1 (L) 1.7 - 7.7 K/uL   Lymphocytes Relative 59 (H) 12 - 46 %   Lymphs Abs 0.4 (L) 0.7 - 4.0 K/uL   Monocytes Relative 25 (H) 3 - 12 %   Monocytes Absolute 0.2 0.1 - 1.0 K/uL   Eosinophils Relative 4 0 - 5 %   Eosinophils Absolute 0.0 0.0 - 0.7 K/uL   Basophils Relative  1 0 - 1 %   Basophils Absolute 0.0 0.0 - 0.1 K/uL   WBC Morphology ATYPICAL LYMPHOCYTES   Blood culture (routine x 2)     Status: None (Preliminary result)   Collection Time: 07/31/14 10:30 AM  Result Value Ref Range   Specimen Description BLOOD LEFT HAND    Special Requests      BOTTLES DRAWN AEROBIC AND ANAEROBIC 5CC  IMMUNE:COMPROMISED   Culture PENDING    Report Status PENDING   Blood culture (routine x 2)     Status: None (Preliminary result)   Collection Time: 07/31/14 10:30 AM  Result Value Ref Range   Specimen Description BLOOD RIGHT HAND    Special Requests      BOTTLES DRAWN AEROBIC AND ANAEROBIC AEB=6CC ANA=4CC  IMMUNE:COMPROMISED   Culture PENDING    Report Status PENDING   Lactic acid, plasma     Status: Abnormal   Collection Time: 07/31/14 10:30 AM  Result Value Ref Range   Lactic Acid, Venous 2.4 (HH) 0.5 - 2.0 mmol/L    Comment: CRITICAL RESULT CALLED TO, READ BACK BY AND VERIFIED WITH: HONEYCUTT,C AT 10:55AM ON 07/31/14 BY FESTERMAN,C   Influenza panel by PCR (type A & B, H1N1)     Status: None   Collection Time: 07/31/14 10:30 AM  Result Value Ref Range   Influenza A By PCR NEGATIVE NEGATIVE   Influenza B By PCR NEGATIVE NEGATIVE   H1N1 flu by pcr NOT DETECTED NOT DETECTED    Comment:        The Xpert Flu assay (FDA approved for nasal aspirates or washes and nasopharyngeal swab specimens), is intended as an aid in the diagnosis of influenza and should not be used as a sole basis for treatment.   Lactic acid, plasma     Status: None   Collection Time: 07/31/14 12:58 PM  Result Value Ref Range   Lactic Acid, Venous 1.0 0.5 - 2.0 mmol/L  Comprehensive metabolic panel     Status: Abnormal   Collection Time: 08/01/14  6:08 AM  Result Value Ref Range   Sodium 140 135 - 145 mmol/L   Potassium 3.8 3.5 - 5.1 mmol/L   Chloride 110 96 - 112 mmol/L   CO2 25 19 - 32 mmol/L   Glucose, Bld 90 70 - 99 mg/dL   BUN 14 6 - 23 mg/dL   Creatinine, Ser 0.65 0.50 -  1.10 mg/dL   Calcium 7.5 (L) 8.4 - 10.5 mg/dL   Total Protein 5.2 (L) 6.0 - 8.3 g/dL   Albumin 2.6 (L) 3.5 - 5.2 g/dL   AST 20 0 - 37 U/L   ALT 20 0 - 35 U/L   Alkaline Phosphatase 54 39 - 117 U/L   Total Bilirubin 1.3 (H) 0.3 - 1.2 mg/dL   GFR calc non Af Amer >90 >90 mL/min   GFR calc Af Amer >90 >90 mL/min    Comment: (NOTE) The eGFR  has been calculated using the CKD EPI equation. This calculation has not been validated in all clinical situations. eGFR's persistently <90 mL/min signify possible Chronic Kidney Disease.    Anion gap 5 5 - 15  CBC     Status: Abnormal   Collection Time: 08/01/14  6:08 AM  Result Value Ref Range   WBC 2.1 (L) 4.0 - 10.5 K/uL   RBC 2.96 (L) 3.87 - 5.11 MIL/uL   Hemoglobin 9.2 (L) 12.0 - 15.0 g/dL    Comment: DELTA CHECK NOTED RESULT REPEATED AND VERIFIED    HCT 27.8 (L) 36.0 - 46.0 %   MCV 93.9 78.0 - 100.0 fL   MCH 31.1 26.0 - 34.0 pg   MCHC 33.1 30.0 - 36.0 g/dL   RDW 12.2 11.5 - 15.5 %   Platelets 74 (L) 150 - 400 K/uL    Comment: SPECIMEN CHECKED FOR CLOTS PLATELET COUNT CONFIRMED BY SMEAR       RADIOGRAPHY: Dg Chest 2 View  07/31/2014   CLINICAL DATA:  Fever and cough and back pain.  Sore throat.  EXAM: CHEST  2 VIEW  COMPARISON:  None.  FINDINGS: The heart size and mediastinal contours are within normal limits. Both lungs are clear. The visualized skeletal structures are unremarkable.  IMPRESSION: No acute abnormalities.   Electronically Signed   By: Lorriane Shire M.D.   On: 07/31/2014 10:58       PATHOLOGY:  Nothing new   ASSESSMENT:  1. Febrile neutropenia, unclear etiology.  On Vancomycin and Cefepime.  Doxycycline added due to discovery of tick on exam. 2. Pancytopenia, new.  Feb 2016 labs at [facility] relatively normal with only minimal anemia.  Reactive versus relapse of follicular lymphoma? 3. Stage III Follicular Lymphoma, Grade I-II, S/P Rituxan single-agent weekly x 4 12/07/2012- 12/28/2012 followed by maintenance Rituxan every  3 months beginning on 02/22/2013 with last treatment on 11/29/2013.  Followed by Dr. Karle Starch at [facility]. Last seen on 06/06/2014 at hematology clinic at [facility]. 4. Morbid obesity, S/P gastric bypass at [facility] in 2007.   PLAN:  1. I personally reviewed and went over laboratory results with the patient.  The results are noted within this dictation. 2. I personally reviewed and went over radiographic studies with the patient.  The results are noted within this dictation.   3. Chart reviewed 4. CareEverywhere chart reviewed. 5. Labs today: Peripheral FLOW cytometry, LDH, Haptoglobin, SPEP + IFE, B2M, ESR, CRP, ANA, HIV antibody, acute hepatitis panel, and peripheral smear for pathology review. 6. Will evaluation for lymphoma recurrence and infiltration of bone marrow.  IR bone marrow aspiration and biopsy this week.  Order placed.  7. CT CAP and neck with contrast 8. Will follow along closely while in the hospital.  Please do not hesitate to call [facility] at [phone].  Pager number is: Pager: [phone]   All questions were answered. The patient knows to call the clinic with any problems, questions or concerns. We can certainly see the patient much sooner if necessary.  Patient and plan discussed with Dr. Ancil Linsey and she is in agreement with the aforementioned.   KEFALAS,THOMAS  08/01/2014 11:52 AM Pager: [phone]   Patient seen and examined.  Will proceed as above. I do not suspect NHL recurrence as cause of pancytopenia but would absolutely recommend marrow to r/o possibility and possibility of other marrow process. Note that WBC count this am was improved. Discussed with patient sending her to [facility] given that all of her hematologic/oncologic care  has been there but she wished to stay here.  Will follow with labs as they become available.  She has a history of gastric bypass with B12 deficiency but notes she has been on B12 replacement. B12 is pending. Continue with current abx  regimen and other supportive care. Donald Pore MD

## 2014-08-01 NOTE — Progress Notes (Signed)
[facility] PROGRESS NOTE  Angela Michael DUK:[id number] DOB: 26-Dec-1960 DOA: 07/31/2014 PCP: Chevis Pretty, FNP  Assessment/Plan: Neutropenia with fever: Etiology unclear. Concern for recurrence lymphoma. WC trending up somewhat.   Afebrile for last 16 hours. continues with sore throat.  Chest x-ray urinalysis unremarkable.  Will continue the cefepime and vancomycin and doxy day #2.   Will continue IV fluid as BP remains soft. Await parvo B19 and blood cultures pending. Evaluated by oncology who recommend bone marrow. Await further recommendations Active Problems: SIRS: resolving. See #1. Will continue to hold her home Lasix. Monitor closely.  Lymphoma: chart review indicates follicular lymphoma grade 1-2. Finished chemo 12/2013. CT 02/2014 was negative. Received treatment [facility]. Await oncology recommendations   Convulsions/seizures: Chart review indicates no seizure activity in many years. Will continue her Keppra at home dose   Anemia: trending down. Likely dilutional.  Await anemia panel.   Hypokalemia: resolved.  She is on Lasix daily. Holding for now.    SVT (supraventricular tachycardia): History of same. Home medications include Xarelto. I will hold this given her platelet count. Chart review indicates last visit with cardiology a year ago. She denies chest pain palpitation shortness of breath lightheadedness. Recommended 81 mg of aspirin and Lipitor   Morbid obesity: BMI 40.9. Status post gastric bypass surgery many years ago. Nutritional consult   Hypoxia: Related to #2. Oxygen saturation levels greater than 90% on 2 L nasal cannula. Will wean as able      Code Status: full Family Communication: husband at bedside Disposition Plan: home when ready   Consultants:  Heme/onc  Procedures:  none  Antibiotics:  Vancomycin 07/31/14>>  cefipime 07/31/14>>  Doxycycline 07/31/14>>  HPI/Subjective: Reports feeling somewhat better and  requesting food.   Objective: Filed Vitals:   08/01/14 0604  BP: 110/44  Pulse: 66  Temp: 97.8 F (36.6 C)  Resp: 18    Intake/Output Summary (Last 24 hours) at 08/01/14 0950 Last data filed at 08/01/14 0108  Gross per 24 hour  Intake 1393.75 ml  Output      0 ml  Net 1393.75 ml   Filed Weights   07/31/14 0858 07/31/14 1347  Weight: 97.977 kg (216 lb) 97 kg (213 lb 13.5 oz)    Exam:   General:  Obese appears comfortable  Cardiovascular: RRR no MRG trace LE edema  Respiratory: normal effort BS distant but clear no crackles  Abdomen: obese soft +BS non-tender  Musculoskeletal: no clubbing or cyanosis   Data Reviewed: Basic Metabolic Panel:  Recent Labs Lab 07/31/14 0920 07/31/14 0926 08/01/14 0608  NA  --  138 140  K  --  3.4* 3.8  CL  --  103 110  CO2  --  26 25  GLUCOSE  --  148* 90  BUN  --  15 14  CREATININE  --  0.97 0.65  CALCIUM  --  8.4 7.5*  MG 1.6  --   --    Liver Function Tests:  Recent Labs Lab 07/31/14 0926 08/01/14 0608  AST 23 20  ALT 23 20  ALKPHOS 85 54  BILITOT 1.9* 1.3*  PROT 6.8 5.2*  ALBUMIN 3.7 2.6*   No results for input(s): LIPASE, AMYLASE in the last 168 hours. No results for input(s): AMMONIA in the last 168 hours. CBC:  Recent Labs Lab 07/31/14 0926 08/01/14 0608  WBC 0.7* 2.1*  NEUTROABS 0.1*  --   HGB 11.7* 9.2*  HCT 35.1* 27.8*  MCV 92.6 93.9  PLT  93* 74*   Cardiac Enzymes: No results for input(s): CKTOTAL, CKMB, CKMBINDEX, TROPONINI in the last 168 hours. BNP (last 3 results) No results for input(s): BNP in the last 8760 hours.  ProBNP (last 3 results) No results for input(s): PROBNP in the last 8760 hours.  CBG: No results for input(s): GLUCAP in the last 168 hours.  Recent Results (from the past 240 hour(s))  Rapid strep screen     Status: None   Collection Time: 07/31/14  9:20 AM  Result Value Ref Range Status   Streptococcus, Group A Screen (Direct) NEGATIVE NEGATIVE Final    Comment:  (NOTE) A Rapid Antigen test may result negative if the antigen level in the sample is below the detection level of this test. The FDA has not cleared this test as a stand-alone test therefore the rapid antigen negative result has reflexed to a Group A Strep culture.   Blood culture (routine x 2)     Status: None (Preliminary result)   Collection Time: 07/31/14 10:30 AM  Result Value Ref Range Status   Specimen Description BLOOD LEFT HAND  Final   Special Requests   Final    BOTTLES DRAWN AEROBIC AND ANAEROBIC 5CC  IMMUNE:COMPROMISED   Culture PENDING  Incomplete   Report Status PENDING  Incomplete  Blood culture (routine x 2)     Status: None (Preliminary result)   Collection Time: 07/31/14 10:30 AM  Result Value Ref Range Status   Specimen Description BLOOD RIGHT HAND  Final   Special Requests   Final    BOTTLES DRAWN AEROBIC AND ANAEROBIC AEB=6CC ANA=4CC  IMMUNE:COMPROMISED   Culture PENDING  Incomplete   Report Status PENDING  Incomplete     Studies: Dg Chest 2 View  07/31/2014   CLINICAL DATA:  Fever and cough and back pain.  Sore throat.  EXAM: CHEST  2 VIEW  COMPARISON:  None.  FINDINGS: The heart size and mediastinal contours are within normal limits. Both lungs are clear. The visualized skeletal structures are unremarkable.  IMPRESSION: No acute abnormalities.   Electronically Signed   By: Lorriane Shire M.D.   On: 07/31/2014 10:58    Scheduled Meds: . ceFEPime (MAXIPIME) IV  1 g Intravenous 3 times per day  . doxycycline  100 mg Oral Q12H  . FLUoxetine  80 mg Oral Daily  . levETIRAcetam  1,000 mg Oral BID  . sodium chloride  3 mL Intravenous Q12H  . vancomycin  1,000 mg Intravenous Q12H   Continuous Infusions: . sodium chloride 125 mL/hr at 08/01/14 0108    Principal Problem:   Neutropenia with fever Active Problems:   Hyperlipidemia with target LDL less than 100   Convulsions/seizures   Anemia   Hypokalemia   SVT (supraventricular tachycardia)   Morbid  obesity   Lymphoma   Hypoxia   SIRS (systemic inflammatory response syndrome)   Thrombocytopenia    Time spent: 35 minutes    [facility] Pager [phone]. If 7PM-7AM, please contact night-coverage at [email or web address], password [facility] 08/01/2014, 9:50 AM  LOS: 1 day

## 2014-08-01 NOTE — Progress Notes (Signed)
Patient ID: Angela Michael, female   DOB: 1960/08/15, 54 y.o.   MRN: [id number]   Recurrent lymphoma Pancytopenia   Pt scheduled for BM bx at [facility] 08/02/14  To be at [facility] via ambulance by 830 am  See orders

## 2014-08-01 NOTE — Care Management Note (Addendum)
    Page 1 of 1   08/03/2014     1:07:40 PM CARE MANAGEMENT NOTE 08/03/2014  Patient:  Angela Michael, Angela Michael   Account Number:  [id number]  Date Initiated:  08/01/2014  Documentation initiated by:  Theophilus Kinds  Subjective/Objective Assessment:   Pt admitted from home with sepsis. Pt lives with her family and will return home at discharge. Pt has been fairly independent with ADL's.     Action/Plan:   Will continue to follow for discharge planning needs.   Anticipated DC Date:  08/03/2014   Anticipated DC Plan:  [place]  CM consult      Choice offered to / List presented to:             Status of service:  Completed, signed off Medicare Important Message given?   (If response is "NO", the following Medicare IM given date fields will be blank) Date Medicare IM given:   Medicare IM given by:   Date Additional Medicare IM given:   Additional Medicare IM given by:    Discharge Disposition:  HOME/SELF CARE  Per UR Regulation:    If discussed at Long Length of Stay Meetings, dates discussed:    Comments:  08/03/2014 [place], RN, MSN, CM Pt discharging home today. No CM needs. 08/01/14 [place], RN BSN CM

## 2014-08-02 ENCOUNTER — Ambulatory Visit (HOSPITAL_COMMUNITY)
Admit: 2014-08-02 | Discharge: 2014-08-02 | Disposition: A | Discharge status: Home / Self Care | Payer: Medicaid Other | Attending: Oncology | Admitting: Oncology

## 2014-08-02 ENCOUNTER — Ambulatory Visit (HOSPITAL_COMMUNITY)
Admit: 2014-08-02 | Discharge: 2014-08-02 | Disposition: A | Discharge status: Home / Self Care | Payer: Medicaid Other | Attending: Interventional Radiology | Admitting: Interventional Radiology

## 2014-08-02 ENCOUNTER — Encounter: Payer: Self-pay | Admitting: Hematology & Oncology

## 2014-08-02 VITALS — BP 108/60 | HR 55 | Temp 98.5°F | Resp 16

## 2014-08-02 DIAGNOSIS — D510 Vitamin B12 deficiency anemia due to intrinsic factor deficiency: Secondary | ICD-10-CM

## 2014-08-02 DIAGNOSIS — A419 Sepsis, unspecified organism: Secondary | ICD-10-CM

## 2014-08-02 DIAGNOSIS — R509 Fever, unspecified: Secondary | ICD-10-CM

## 2014-08-02 DIAGNOSIS — C8290 Follicular lymphoma, unspecified, unspecified site: Secondary | ICD-10-CM

## 2014-08-02 DIAGNOSIS — R569 Unspecified convulsions: Secondary | ICD-10-CM

## 2014-08-02 DIAGNOSIS — D61818 Other pancytopenia: Secondary | ICD-10-CM | POA: Insufficient documentation

## 2014-08-02 DIAGNOSIS — I471 Supraventricular tachycardia: Secondary | ICD-10-CM

## 2014-08-02 DIAGNOSIS — D709 Neutropenia, unspecified: Secondary | ICD-10-CM | POA: Insufficient documentation

## 2014-08-02 DIAGNOSIS — C8590 Non-Hodgkin lymphoma, unspecified, unspecified site: Secondary | ICD-10-CM

## 2014-08-02 LAB — CBC
HCT: 28.7 % — ABNORMAL LOW (ref 36.0–46.0)
Hemoglobin: 9.6 g/dL — ABNORMAL LOW (ref 12.0–15.0)
MCH: 31.4 pg (ref 26.0–34.0)
MCHC: 33.4 g/dL (ref 30.0–36.0)
MCV: 93.8 fL (ref 78.0–100.0)
Platelets: 118 10*3/uL — ABNORMAL LOW (ref 150–400)
RBC: 3.06 MIL/uL — ABNORMAL LOW (ref 3.87–5.11)
RDW: 12.2 % (ref 11.5–15.5)
WBC: 6.5 10*3/uL (ref 4.0–10.5)

## 2014-08-02 LAB — BASIC METABOLIC PANEL
Anion gap: 6 (ref 5–15)
BUN: 10 mg/dL (ref 6–23)
CALCIUM: 8.1 mg/dL — AB (ref 8.4–10.5)
CO2: 24 mmol/L (ref 19–32)
CREATININE: 0.65 mg/dL (ref 0.50–1.10)
Chloride: 107 mmol/L (ref 96–112)
GFR calc Af Amer: >90 mL/min (ref >90)
GLUCOSE: 84 mg/dL (ref 70–99)
Potassium: 3.3 mmol/L — ABNORMAL LOW (ref 3.5–5.1)
Sodium: 137 mmol/L (ref 135–145)

## 2014-08-02 LAB — IMMUNOFIXATION ELECTROPHORESIS
IGA: 132 mg/dL (ref 87–352)
IGM, SERUM: 18 mg/dL — AB (ref 26–217)
IgG (Immunoglobin G), Serum: 578 mg/dL — ABNORMAL LOW (ref 700–1600)
Total Protein ELP: 4.5 g/dL — ABNORMAL LOW (ref 6.0–8.5)

## 2014-08-02 LAB — PROTIME-INR
INR: 1.07 (ref 0.00–1.49)
Prothrombin Time: 14.1 seconds (ref 11.6–15.2)

## 2014-08-02 LAB — PROTEIN ELECTROPHORESIS, SERUM
A/G Ratio: 1.1 (ref 0.7–2.0)
ALPHA-2-GLOBULIN: 0.6 g/dL (ref 0.4–1.2)
Albumin ELP: 2.5 g/dL — ABNORMAL LOW (ref 3.2–5.6)
Alpha-1-Globulin: 0.3 g/dL (ref 0.1–0.4)
Beta Globulin: 0.7 g/dL (ref 0.6–1.3)
GAMMA GLOBULIN: 0.5 g/dL (ref 0.5–1.6)
Globulin, Total: 2.2 g/dL (ref 2.0–4.5)
Total Protein ELP: 4.7 g/dL — ABNORMAL LOW (ref 6.0–8.5)

## 2014-08-02 LAB — MAGNESIUM: Magnesium: 1.6 mg/dL (ref 1.5–2.5)

## 2014-08-02 LAB — BETA 2 MICROGLOBULIN, SERUM: Beta-2 Microglobulin: 1.9 mg/L (ref 0.6–2.4)

## 2014-08-02 LAB — CULTURE, GROUP A STREP: Strep A Culture: NEGATIVE

## 2014-08-02 LAB — HEPATITIS PANEL, ACUTE
HCV Ab: NEGATIVE
Hep A IgM: NONREACTIVE
Hep B C IgM: NONREACTIVE
Hepatitis B Surface Ag: NEGATIVE

## 2014-08-02 LAB — BONE MARROW EXAM

## 2014-08-02 LAB — ANTINUCLEAR ANTIBODIES, IFA: ANA Ab, IFA: NEGATIVE

## 2014-08-02 LAB — APTT: aPTT: 34 seconds (ref 24–37)

## 2014-08-02 LAB — HIV ANTIBODY (ROUTINE TESTING W REFLEX): HIV SCREEN 4TH GENERATION: NONREACTIVE

## 2014-08-02 MED ORDER — FENTANYL CITRATE (PF) 100 MCG/2ML IJ SOLN
INTRAMUSCULAR | Status: AC | PRN
  Administered 2014-08-02: 50 ug via INTRAVENOUS

## 2014-08-02 MED ORDER — MENTHOL 3 MG MT LOZG: 1.0000 | LOZENGE | OROMUCOSAL | Status: DC | PRN

## 2014-08-02 MED ORDER — GUAIFENESIN-DM 100-10 MG/5ML PO SYRP
5.0000 mL | ORAL_SOLUTION | ORAL | Status: DC | PRN
  Administered 2014-08-02: 5 mL via ORAL
  Filled 2014-08-02: qty 5

## 2014-08-02 MED ORDER — MIDAZOLAM HCL 2 MG/2ML IJ SOLN
INTRAMUSCULAR | Status: AC
  Filled 2014-08-02: qty 6

## 2014-08-02 MED ORDER — FENTANYL CITRATE (PF) 100 MCG/2ML IJ SOLN
INTRAMUSCULAR | Status: AC
  Filled 2014-08-02: qty 4

## 2014-08-02 MED ORDER — MIDAZOLAM HCL 2 MG/2ML IJ SOLN
INTRAMUSCULAR | Status: AC | PRN
  Administered 2014-08-02 (×3): 1 mg via INTRAVENOUS

## 2014-08-02 MED ORDER — POTASSIUM CHLORIDE CRYS ER 20 MEQ PO TBCR
40.0000 meq | EXTENDED_RELEASE_TABLET | Freq: Once | ORAL | Status: DC
Start: 2014-08-02 — End: 2014-08-03

## 2014-08-02 NOTE — Discharge Instructions (Signed)
Bone Marrow Aspiration, Bone Marrow Biopsy °Care After °Read the instructions outlined below and refer to this sheet in the next few weeks. These discharge instructions provide you with general information on caring for yourself after you leave the hospital. Your caregiver may also give you specific instructions. While your treatment has been planned according to the most current medical practices available, unavoidable complications occasionally occur. If you have any problems or questions after discharge, call your caregiver. °FINDING OUT THE RESULTS OF YOUR TEST °Not all test results are available during your visit. If your test results are not back during the visit, make an appointment with your caregiver to find out the results. Do not assume everything is normal if you have not heard from your caregiver or the medical facility. It is important for you to follow up on all of your test results.  °HOME CARE INSTRUCTIONS  °You have had sedation and may be sleepy or dizzy. Your thinking may not be as clear as usual. For the next 24 hours: °· Only take over-the-counter or prescription medicines for pain, discomfort, and or fever as directed by your caregiver. °· Do not drink alcohol. °· Do not smoke. °· Do not drive. °· Do not make important legal decisions. °· Do not operate heavy machinery. °· Do not care for small children by yourself. °· Keep your dressing clean and dry. You may replace dressing with a bandage after 24 hours. °· You may take a bath or shower after 24 hours. °· Use an ice pack for 20 minutes every 2 hours while awake for pain as needed. °SEEK MEDICAL CARE IF:  °· There is redness, swelling, or increasing pain at the biopsy site. °· There is pus coming from the biopsy site. °· There is drainage from a biopsy site lasting longer than one day. °· An unexplained oral temperature above 102° F (38.9° C) develops. °SEEK IMMEDIATE MEDICAL CARE IF:  °· You develop a rash. °· You have difficulty  breathing. °· You develop any reaction or side effects to medications given. °Document Released: 10/18/2004 Document Revised: 06/23/2011 Document Reviewed: 03/28/2008 °ExitCare® Patient Information ©2015 [employer]. This information is not intended to replace advice given to you by your health care provider. Make sure you discuss any questions you have with your health care provider. °Conscious Sedation °Sedation is the use of medicines to promote relaxation and relieve discomfort and anxiety. Conscious sedation is a type of sedation. Under conscious sedation you are less alert than normal but are still able to respond to instructions or stimulation. Conscious sedation is used during short medical and dental procedures. It is milder than deep sedation or general anesthesia and allows you to return to your regular activities sooner.  °LET YOUR HEALTH CARE PROVIDER KNOW ABOUT:  °· Any allergies you have. °· All medicines you are taking, including vitamins, herbs, eye drops, creams, and over-the-counter medicines. °· Use of steroids (by mouth or creams). °· Previous problems you or members of your family have had with the use of anesthetics. °· Any blood disorders you have. °· Previous surgeries you have had. °· Medical conditions you have. °· Possibility of pregnancy, if this applies. °· Use of cigarettes, alcohol, or illegal drugs. °RISKS AND COMPLICATIONS °Generally, this is a safe procedure. However, as with any procedure, problems can occur. Possible problems include: °· Oversedation. °· Trouble breathing on your own. You may need to have a breathing tube until you are awake and breathing on your own. °· Allergic reaction   to any of the medicines used for the procedure. °BEFORE THE PROCEDURE °· You may have blood tests done. These tests can help show how well your kidneys and liver are working. They can also show how well your blood clots. °· A physical exam will be done.   °· Only take medicines as directed by  your health care provider. You may need to stop taking medicines (such as blood thinners, aspirin, or nonsteroidal anti-inflammatory drugs) before the procedure.   °· Do not eat or drink at least 6 hours before the procedure or as directed by your health care provider. °· Arrange for a responsible adult, family member, or friend to take you home after the procedure. He or she should stay with you for at least 24 hours after the procedure, until the medicine has worn off. °PROCEDURE  °· An intravenous (IV) catheter will be inserted into one of your veins. Medicine will be able to flow directly into your body through this catheter. You may be given medicine through this tube to help prevent pain and help you relax. °· The medical or dental procedure will be done. °AFTER THE PROCEDURE °· You will stay in a recovery area until the medicine has worn off. Your blood pressure and pulse will be checked.   °·  Depending on the procedure you had, you may be allowed to go home when you can tolerate liquids and your pain is under control. °Document Released: 12/24/2000 Document Revised: 04/05/2013 Document Reviewed: 12/06/2012 °ExitCare® Patient Information ©2015 [employer]. This information is not intended to replace advice given to you by your health care provider. Make sure you discuss any questions you have with your health care provider. ° °

## 2014-08-02 NOTE — Progress Notes (Signed)
Can not keep sats up above 86 on room air w/o encouraging patient to take deep breaths.  2 L [place] O2 restarted

## 2014-08-02 NOTE — Progress Notes (Signed)
[facility] PROGRESS NOTE  Woodrow Dulski RDE:[id number] DOB: 11-23-1960 DOA: 07/31/2014 PCP: Chevis Pretty, FNP  Assessment/Plan: Neutropenia with fever: Etiology unclear. Concern for recurrence lymphoma. WC within the limits of normal this am. Remains afebrile last 36 hours. Continues with sore throat. Chest x-ray and  urinalysis unremarkable. Will continue the cefepime and vancomycin and doxy day #2.Willcontinue IV fluid at slower rate. Sed rate and c-reactive protein elevated.  Await parvo B19. Blood cultures with no growth to date. S/P  bone marrow today. Await further recommendations from oncolog Active Problems: SIRS: resolved. See #1. Will continue to hold her home Lasix. Monitor closely.  Lymphoma: s/p bone marrow biopsy today. Appreciate oncology assistance. Await further recommendations.  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 Finished chemo 12/2013. CT 02/2014 was negative. Received treatment [facility].    Convulsions/seizures: Chart review indicates no seizure activity in many years. Will continue her Keppra at home dose   Anemia: stable. Likely dilutional. Anemia panel with low iron and TIBC and ferritin 442.    Hypokalemia: likely related to IV fluids. She is on Lasix daily at home but holding for now. Will replete and recheck and decrease rate of IV fluids. Check mag level   SVT (supraventricular tachycardia): History of same. Home medications include Xarelto. Continue to hold zarelto as platelet 118.Marland Kitchen Chart review indicates last visit with cardiology a year ago. She denies chest pain palpitation shortness of breath lightheadedness. Continue 81 mg of aspirin and Lipitor   Morbid obesity: BMI 40.9. Status post gastric bypass surgery many years ago. Nutritional consult   Hypoxia: Related to #2. Oxygen saturation levels greater than 90% on 2 L nasal cannula. Will wean as able   Code Status: full Family Communication: none  present Disposition Plan: home when ready   Consultants:  Oncology/heme  Procedures:  Bone marrow biopsy 08/02/14  Antibiotics:  Vancomycin 07/31/14>>  cefipime 07/31/14>>  Doxycycline 07/31/14>>   HPI/Subjective: Awake reports feeling "better today" reports some "soreness" at biopsy site  Objective: Filed Vitals:   08/02/14 1120  BP: 105/58  Pulse: 52  Temp: 98.6 F (37 C)  Resp: 18    Intake/Output Summary (Last 24 hours) at 08/02/14 1342 Last data filed at 08/01/14 1730  Gross per 24 hour  Intake 2035.83 ml  Output      0 ml  Net 2035.83 ml   Filed Weights   07/31/14 0858 07/31/14 1347  Weight: 97.977 kg (216 lb) 97 kg (213 lb 13.5 oz)    Exam:   General:  Obese appears comfortale  Cardiovascular: S1 and S2 no m/g/r no LE edema  Respiratory: normal effort BS clear bilaterally no wheeze or crackles  Abdomen: obese non-distended +BS non-tender  Musculoskeletal: joints without swelling/erythema MAE   Data Reviewed: Basic Metabolic Panel:  Recent Labs Lab 07/31/14 0920 07/31/14 0926 08/01/14 0608 08/02/14 0613  NA  --  138 140 137  K  --  3.4* 3.8 3.3*  CL  --  103 110 107  CO2  --  _0 GLUCOSE  --  148* 90 84  BUN  --  _1 CREATININE  --  0.97 0.65 0.65  CALCIUM  --  8.4 7.5* 8.1*  MG 1.6  --   --   --    Liver Function Tests:  Recent Labs Lab 07/31/14 0926 08/01/14 0608  AST 23 20  ALT 23 20  ALKPHOS 85 54  BILITOT 1.9* 1.3*  PROT 6.8 5.2*  ALBUMIN 3.7 2.6*   No results for input(s): LIPASE, AMYLASE in the last 168 hours. No results for input(s): AMMONIA in the last 168 hours. CBC:  Recent Labs Lab 07/31/14 0926 08/01/14 0608 08/02/14 0613  WBC 0.7* 2.1* 6.5  NEUTROABS 0.1*  --   --   HGB 11.7* 9.2* 9.6*  HCT 35.1* 27.8* 28.7*  MCV 92.6 93.9 93.8  PLT 93* 74* 118*   Cardiac Enzymes: No results for input(s): CKTOTAL, CKMB, CKMBINDEX, TROPONINI in the last 168 hours. BNP (last 3 results) No results  for input(s): BNP in the last 8760 hours.  ProBNP (last 3 results) No results for input(s): PROBNP in the last 8760 hours.  CBG: No results for input(s): GLUCAP in the last 168 hours.  Recent Results (from the past 240 hour(s))  Rapid strep screen     Status: None   Collection Time: 07/31/14  9:20 AM  Result Value Ref Range Status   Streptococcus, Group A Screen (Direct) NEGATIVE NEGATIVE Final    Comment: (NOTE) A Rapid Antigen test may result negative if the antigen level in the sample is below the detection level of this test. The FDA has not cleared this test as a stand-alone test therefore the rapid antigen negative result has reflexed to a Group A Strep culture.   Culture, Group A Strep     Status: None   Collection Time: 07/31/14  9:20 AM  Result Value Ref Range Status   Strep A Culture Negative  Final    Comment: (NOTE) Performed At: [facility] [place], [place] [id number] Lindon Romp MD [id number]   Blood culture (routine x 2)     Status: None (Preliminary result)   Collection Time: 07/31/14 10:30 AM  Result Value Ref Range Status   Specimen Description BLOOD LEFT HAND  Final   Special Requests   Final    BOTTLES DRAWN AEROBIC AND ANAEROBIC 5CC  IMMUNE:COMPROMISED   Culture NO GROWTH 1 DAY  Final   Report Status PENDING  Incomplete  Blood culture (routine x 2)     Status: None (Preliminary result)   Collection Time: 07/31/14 10:30 AM  Result Value Ref Range Status   Specimen Description BLOOD RIGHT HAND  Final   Special Requests   Final    BOTTLES DRAWN AEROBIC AND ANAEROBIC AEB=6CC ANA=4CC  IMMUNE:COMPROMISED   Culture NO GROWTH 1 DAY  Final   Report Status PENDING  Incomplete     Studies: Ct Soft Tissue Neck W Contrast  08/01/2014   CLINICAL DATA:  Febrile neutropenia. Pancytopenia. History of follicular lymphoma status post chemotherapy.  EXAM: CT NECK WITH CONTRAST  TECHNIQUE: Multidetector CT imaging of the neck was  performed using the standard protocol following the bolus administration of intravenous contrast.  CONTRAST:  Cervical spine CT 09/21/2011  COMPARISON:  None.  FINDINGS: Pharynx and larynx: There is mild symmetric prominence of the soft tissues posteriorly in the nasopharynx, new from the prior cervical spine CT. There is also mild symmetric prominence of the palatine tonsillar soft tissues which is similar to the prior CT. No discrete pharyngeal is identified. Larynx is unremarkable.  Salivary glands: Submandibular and parotid glands are unremarkable.  Thyroid: Unremarkable.  Lymph nodes: No enlarged lymph nodes are identified in the neck.  Vascular: Major vascular structures of the neck are grossly patent. Right internal carotid artery is tortuous. Minimal atherosclerotic calcification is noted.  Limited intracranial: The visualized portion of the brain is unremarkable.  Visualized orbits:  Prior bilateral cataract extraction.  Mastoids and visualized paranasal sinuses: Mild mucosal thickening involving multiple left ethmoid air cells. Mastoid air cells are clear.  Skeleton: Mild cervical spondylosis. No lytic or blastic osseous lesions identified.  Upper chest: Evaluated on concurrent dedicated chest CT.  IMPRESSION: No enlarged cervical lymph nodes. Symmetric prominence of the nasopharyngeal and palatine tonsillar soft tissues, mildly increased from the prior cervical spine CT and nonspecific. Lymphomaotus involvement not completely excluded.   Electronically Signed   By: Logan Bores   On: 08/01/2014 18:43   Ct Chest W Contrast  08/01/2014   CLINICAL DATA:  Febrile neutropenia. History of follicular lymphoma status post chemotherapy.  EXAM: CT CHEST, ABDOMEN, AND PELVIS WITH CONTRAST  TECHNIQUE: Multidetector CT imaging of the chest, abdomen and pelvis was performed following the standard protocol during bolus administration of intravenous contrast.  CONTRAST:  160mL OMNIPAQUE IOHEXOL 300 MG/ML  SOLN   COMPARISON:  None.  FINDINGS: CT CHEST FINDINGS  Mediastinum/Nodes: No axillary, mediastinal or hilar lymphadenopathy. 6 mm right peritracheal lymph node (image 11; series 3). Normal heart size. No pericardial effusion. Aorta and main pulmonary artery are normal in caliber.  Lungs/Pleura: The central airways are patent. Patchy ground-glass and consolidative opacities within the left lower lobe. Minimal peripheral consolidative opacities within the left upper lobe. No pleural effusion or pneumothorax.  Musculoskeletal: No aggressive or acute appearing osseous lesions.  CT ABDOMEN AND PELVIS FINDINGS  Hepatobiliary: The liver is normal in size and contour without focal hepatic lesion identified. The gallbladder is surgically absent. No intrahepatic or extrahepatic biliary ductal dilatation.  Pancreas: Unremarkable  Spleen: Mild splenomegaly measuring 15 cm.  Adrenals/Urinary Tract: There is an indeterminate 1.9 cm left adrenal lesion. The right adrenal gland is unremarkable. Kidneys are symmetric in size and demonstrate mild patchy enhancement. Urinary bladder is decompressed and poorly evaluated.  Stomach/Bowel: Extensive sigmoid colonic diverticulosis. No CT evidence to suggest acute diverticulitis. No evidence for bowel obstruction. Postoperative changes compatible with gastric bypass procedure. There is suggestion of mild wall thickening of the excluded portion of the stomach.  Vascular/Lymphatic: Normal caliber abdominal aorta. Inferior vena cava filter is present. No retroperitoneal lymphadenopathy.  Other: Mild anasarca. Fat containing hernia within the anterior right lower quadrant. Uterus is unremarkable.  Musculoskeletal: Lumbar spine degenerative changes. No aggressive or acute appearing osseous lesions.  IMPRESSION: 1. There is suggestion of mild patchy enhancement of the kidneys bilaterally, this may potentially be technical in etiology given the quantum mottle artifact on the study however underlying  infectious process is not excluded. Additionally there is suggestion of mild wall thickening of the decompressed urinary bladder. Recommend correlation with urinalysis to exclude the possibility of infection. 2. Suggestion of mild wall thickening of the excluded portion of the stomach status post gastric bypass surgery. This may be normal for this patient however in the absence of priors, gastritis is not excluded. 3. Minimal ground-glass and consolidative opacities within the left lower lobe favored represent atelectasis. Infection not excluded. 4. Splenomegaly. 5. Indeterminate left adrenal lesion. Recommend comparison with outside priors or followup MRI in 12 months.   Electronically Signed   By: Lovey Newcomer M.D.   On: 08/01/2014 18:52   Ct Abdomen Pelvis W Contrast  08/01/2014   CLINICAL DATA:  Febrile neutropenia. History of follicular lymphoma status post chemotherapy.  EXAM: CT CHEST, ABDOMEN, AND PELVIS WITH CONTRAST  TECHNIQUE: Multidetector CT imaging of the chest, abdomen and pelvis was performed following the standard protocol during bolus administration of intravenous  contrast.  CONTRAST:  172m OMNIPAQUE IOHEXOL 300 MG/ML  SOLN  COMPARISON:  None.  FINDINGS: CT CHEST FINDINGS  Mediastinum/Nodes: No axillary, mediastinal or hilar lymphadenopathy. 6 mm right peritracheal lymph node (image 11; series 3). Normal heart size. No pericardial effusion. Aorta and main pulmonary artery are normal in caliber.  Lungs/Pleura: The central airways are patent. Patchy ground-glass and consolidative opacities within the left lower lobe. Minimal peripheral consolidative opacities within the left upper lobe. No pleural effusion or pneumothorax.  Musculoskeletal: No aggressive or acute appearing osseous lesions.  CT ABDOMEN AND PELVIS FINDINGS  Hepatobiliary: The liver is normal in size and contour without focal hepatic lesion identified. The gallbladder is surgically absent. No intrahepatic or extrahepatic biliary  ductal dilatation.  Pancreas: Unremarkable  Spleen: Mild splenomegaly measuring 15 cm.  Adrenals/Urinary Tract: There is an indeterminate 1.9 cm left adrenal lesion. The right adrenal gland is unremarkable. Kidneys are symmetric in size and demonstrate mild patchy enhancement. Urinary bladder is decompressed and poorly evaluated.  Stomach/Bowel: Extensive sigmoid colonic diverticulosis. No CT evidence to suggest acute diverticulitis. No evidence for bowel obstruction. Postoperative changes compatible with gastric bypass procedure. There is suggestion of mild wall thickening of the excluded portion of the stomach.  Vascular/Lymphatic: Normal caliber abdominal aorta. Inferior vena cava filter is present. No retroperitoneal lymphadenopathy.  Other: Mild anasarca. Fat containing hernia within the anterior right lower quadrant. Uterus is unremarkable.  Musculoskeletal: Lumbar spine degenerative changes. No aggressive or acute appearing osseous lesions.  IMPRESSION: 1. There is suggestion of mild patchy enhancement of the kidneys bilaterally, this may potentially be technical in etiology given the quantum mottle artifact on the study however underlying infectious process is not excluded. Additionally there is suggestion of mild wall thickening of the decompressed urinary bladder. Recommend correlation with urinalysis to exclude the possibility of infection. 2. Suggestion of mild wall thickening of the excluded portion of the stomach status post gastric bypass surgery. This may be normal for this patient however in the absence of priors, gastritis is not excluded. 3. Minimal ground-glass and consolidative opacities within the left lower lobe favored represent atelectasis. Infection not excluded. 4. Splenomegaly. 5. Indeterminate left adrenal lesion. Recommend comparison with outside priors or followup MRI in 12 months.   Electronically Signed   By: DLovey NewcomerM.D.   On: 08/01/2014 18:52    Scheduled Meds: . ceFEPime  (MAXIPIME) IV  1 g Intravenous 3 times per day  . doxycycline  100 mg Oral Q12H  . FLUoxetine  80 mg Oral Daily  . levETIRAcetam  1,000 mg Oral BID  . potassium chloride  40 mEq Oral Once  . sodium chloride  3 mL Intravenous Q12H  . vancomycin  1,000 mg Intravenous Q12H   Continuous Infusions: . sodium chloride 125 mL/hr at 08/01/14 1038    Principal Problem:   Neutropenia with fever Active Problems:   Hyperlipidemia with target LDL less than 100   Convulsions/seizures   Anemia   Hypokalemia   SVT (supraventricular tachycardia)   Morbid obesity   Lymphoma   Hypoxia   SIRS (systemic inflammatory response syndrome)   Thrombocytopenia   Pancytopenia    Time spent: 30 minutes    [facility] Pager [phone] If 7PM-7AM, please contact night-coverage at [email or web address], password TSt. [facility]4/20/2016, 1:42 PM  LOS: 2 days             ]

## 2014-08-02 NOTE — Procedures (Signed)
R iliac BM Bx No comp

## 2014-08-02 NOTE — Progress Notes (Signed)
Patient weaned off O2 nasal cannula 2L to 1L then RA. O2 SAT 98% on RA at this time. No s/s of respiratory distress noted at this time.

## 2014-08-02 NOTE — Progress Notes (Signed)
Patient ID: Angela Michael, female   DOB: January 18, 1961, 53 y.o.   MRN: [id number]    Referring Physician(s): Penland,S  Subjective: Patient with history of febrile neutropenia with pancytopenia  and previously diagnosed stage III follicular lymphoma . She continues with generalized bodyaches, coughing, mild headache, occasional nausea. ; she is afebrile today. Request is now received for CT-guided bone marrow biopsy for further evaluation.   Allergies: Advicor  Medications: Prior to Admission medications   Medication Sig Start Date End Date Taking? Authorizing Provider  Ascorbic Acid (VITAMIN C) 1000 MG tablet Take 1,000 mg by mouth daily.    Yes Historical Provider, MD  atorvastatin (LIPITOR) 40 MG tablet Take 1 tablet (40 mg total) by mouth at bedtime. 06/23/14  Yes Mary-Margaret Hassell Done, FNP  cyanocobalamin (,VITAMIN B-12,) 1000 MCG/ML injection Inject 1 mL (1,000 mcg total) into the muscle every 30 (thirty) days. 12/05/13  Yes Mary-Margaret Hassell Done, FNP  Fe Fum-FePoly-Vit C-Vit B3 (INTEGRA) 62.5-62.5-40-3 MG CAPS TAKE ONE CAPSULE BY MOUTH EVERY DAY 06/23/14  Yes Mary-Margaret Hassell Done, FNP  fentaNYL (DURAGESIC - DOSED MCG/HR) 75 MCG/HR Place 1 patch (75 mcg total) onto the skin every 3 (three) days. 06/23/14  Yes Mary-Margaret Hassell Done, FNP  FLUoxetine (PROZAC) 40 MG capsule Take 2 capsules (80 mg total) by mouth daily. 06/23/14  Yes Mary-Margaret Hassell Done, FNP  fluticasone (FLONASE) 50 MCG/ACT nasal spray PLACE 2 SPRAYS INTO THE NOSE AS NEEDED. FOR ALLERGIES 06/28/14  Yes Chipper Herb, MD  furosemide (LASIX) 40 MG tablet TAKE 1 TABLET (40 MG TOTAL) BY MOUTH DAILY. 06/23/14  Yes Mary-Margaret Hassell Done, FNP  gabapentin (NEURONTIN) 300 MG capsule TAKE 2 CAPSULE BY MOUTH 2 TIMES DAILY 06/23/14  Yes Mary-Margaret Hassell Done, FNP  HYDROcodone-acetaminophen (NORCO) 10-325 MG per tablet Take 1 tablet by mouth every 4 (four) hours as needed for moderate pain.  05/25/14  Yes Historical Provider, MD  levETIRAcetam  (KEPPRA) 500 MG tablet TAKE 2 TABLETS BY MOUTH 2 TIMES DAILY 06/23/14  Yes Mary-Margaret Hassell Done, FNP  loratadine (CLARITIN) 10 MG tablet Take 10 mg by mouth as needed. For allergies   Yes Historical Provider, MD  Multiple Vitamin (MULTIVITAMIN WITH MINERALS) TABS Take 1 tablet by mouth daily.   Yes Historical Provider, MD  Multiple Vitamins-Minerals (MULTIVITAMINS THER. W/MINERALS) TABS tablet Take 1 tablet by mouth daily.   Yes Historical Provider, MD  nitroGLYCERIN (NITROSTAT) 0.4 MG SL tablet Place 1 tablet (0.4 mg total) under the tongue every 5 (five) minutes as needed for chest pain. 03/23/14  Yes Mary-Margaret Hassell Done, FNP  Nutritional Supplements (ESTROVEN PMS) TABS Take 1 tablet by mouth daily.    Yes Historical Provider, MD  potassium chloride SA (K-DUR,KLOR-CON) 20 MEQ tablet Take 1 tablet (20 mEq total) by mouth 2 (two) times daily. 06/23/14  Yes Mary-Margaret Hassell Done, FNP  tetrahydrozoline 0.05 % ophthalmic solution Apply 1 drop to eye.   Yes Historical Provider, MD  XARELTO 10 MG TABS tablet Take 1 tablet (10 mg total) by mouth daily. 06/23/14  Yes Mary-Margaret Hassell Done, FNP  furosemide (LASIX) 40 MG tablet TAKE 1 TABLET (40 MG TOTAL) BY MOUTH DAILY. 08/01/14   Mary-Margaret Hassell Done, FNP     Vital Signs: BP 108/67 mmHg  Pulse 62  Temp(Src) 98 F (36.7 C) (Oral)  Resp 20  Ht 5' 1" (1.549 m)  Wt 213 lb 13.5 oz (97 kg)  BMI 40.43 kg/m2  SpO2 97%  Physical Exam patient awake, alert. Chest with few left basilar crackles, right clear; heart with regular rate and rhythm; abdomen  obese, soft, positive bowel sounds, nontender. Extremities with full range of motion, trace lower extremity edema  Imaging: Dg Chest 2 View  07/31/2014   CLINICAL DATA:  Fever and cough and back pain.  Sore throat.  EXAM: CHEST  2 VIEW  COMPARISON:  None.  FINDINGS: The heart size and mediastinal contours are within normal limits. Both lungs are clear. The visualized skeletal structures are unremarkable.   IMPRESSION: No acute abnormalities.   Electronically Signed   By: Lorriane Shire M.D.   On: 07/31/2014 10:58   Ct Soft Tissue Neck W Contrast  08/01/2014   CLINICAL DATA:  Febrile neutropenia. Pancytopenia. History of follicular lymphoma status post chemotherapy.  EXAM: CT NECK WITH CONTRAST  TECHNIQUE: Multidetector CT imaging of the neck was performed using the standard protocol following the bolus administration of intravenous contrast.  CONTRAST:  Cervical spine CT 09/21/2011  COMPARISON:  None.  FINDINGS: Pharynx and larynx: There is mild symmetric prominence of the soft tissues posteriorly in the nasopharynx, new from the prior cervical spine CT. There is also mild symmetric prominence of the palatine tonsillar soft tissues which is similar to the prior CT. No discrete pharyngeal is identified. Larynx is unremarkable.  Salivary glands: Submandibular and parotid glands are unremarkable.  Thyroid: Unremarkable.  Lymph nodes: No enlarged lymph nodes are identified in the neck.  Vascular: Major vascular structures of the neck are grossly patent. Right internal carotid artery is tortuous. Minimal atherosclerotic calcification is noted.  Limited intracranial: The visualized portion of the brain is unremarkable.  Visualized orbits: Prior bilateral cataract extraction.  Mastoids and visualized paranasal sinuses: Mild mucosal thickening involving multiple left ethmoid air cells. Mastoid air cells are clear.  Skeleton: Mild cervical spondylosis. No lytic or blastic osseous lesions identified.  Upper chest: Evaluated on concurrent dedicated chest CT.  IMPRESSION: No enlarged cervical lymph nodes. Symmetric prominence of the nasopharyngeal and palatine tonsillar soft tissues, mildly increased from the prior cervical spine CT and nonspecific. Lymphomaotus involvement not completely excluded.   Electronically Signed   By: Logan Bores   On: 08/01/2014 18:43   Ct Chest W Contrast  08/01/2014   CLINICAL DATA:  Febrile  neutropenia. History of follicular lymphoma status post chemotherapy.  EXAM: CT CHEST, ABDOMEN, AND PELVIS WITH CONTRAST  TECHNIQUE: Multidetector CT imaging of the chest, abdomen and pelvis was performed following the standard protocol during bolus administration of intravenous contrast.  CONTRAST:  161m OMNIPAQUE IOHEXOL 300 MG/ML  SOLN  COMPARISON:  None.  FINDINGS: CT CHEST FINDINGS  Mediastinum/Nodes: No axillary, mediastinal or hilar lymphadenopathy. 6 mm right peritracheal lymph node (image 11; series 3). Normal heart size. No pericardial effusion. Aorta and main pulmonary artery are normal in caliber.  Lungs/Pleura: The central airways are patent. Patchy ground-glass and consolidative opacities within the left lower lobe. Minimal peripheral consolidative opacities within the left upper lobe. No pleural effusion or pneumothorax.  Musculoskeletal: No aggressive or acute appearing osseous lesions.  CT ABDOMEN AND PELVIS FINDINGS  Hepatobiliary: The liver is normal in size and contour without focal hepatic lesion identified. The gallbladder is surgically absent. No intrahepatic or extrahepatic biliary ductal dilatation.  Pancreas: Unremarkable  Spleen: Mild splenomegaly measuring 15 cm.  Adrenals/Urinary Tract: There is an indeterminate 1.9 cm left adrenal lesion. The right adrenal gland is unremarkable. Kidneys are symmetric in size and demonstrate mild patchy enhancement. Urinary bladder is decompressed and poorly evaluated.  Stomach/Bowel: Extensive sigmoid colonic diverticulosis. No CT evidence to suggest acute diverticulitis. No evidence for  bowel obstruction. Postoperative changes compatible with gastric bypass procedure. There is suggestion of mild wall thickening of the excluded portion of the stomach.  Vascular/Lymphatic: Normal caliber abdominal aorta. Inferior vena cava filter is present. No retroperitoneal lymphadenopathy.  Other: Mild anasarca. Fat containing hernia within the anterior right lower  quadrant. Uterus is unremarkable.  Musculoskeletal: Lumbar spine degenerative changes. No aggressive or acute appearing osseous lesions.  IMPRESSION: 1. There is suggestion of mild patchy enhancement of the kidneys bilaterally, this may potentially be technical in etiology given the quantum mottle artifact on the study however underlying infectious process is not excluded. Additionally there is suggestion of mild wall thickening of the decompressed urinary bladder. Recommend correlation with urinalysis to exclude the possibility of infection. 2. Suggestion of mild wall thickening of the excluded portion of the stomach status post gastric bypass surgery. This may be normal for this patient however in the absence of priors, gastritis is not excluded. 3. Minimal ground-glass and consolidative opacities within the left lower lobe favored represent atelectasis. Infection not excluded. 4. Splenomegaly. 5. Indeterminate left adrenal lesion. Recommend comparison with outside priors or followup MRI in 12 months.   Electronically Signed   By: Lovey Newcomer M.D.   On: 08/01/2014 18:52   Ct Abdomen Pelvis W Contrast  08/01/2014   CLINICAL DATA:  Febrile neutropenia. History of follicular lymphoma status post chemotherapy.  EXAM: CT CHEST, ABDOMEN, AND PELVIS WITH CONTRAST  TECHNIQUE: Multidetector CT imaging of the chest, abdomen and pelvis was performed following the standard protocol during bolus administration of intravenous contrast.  CONTRAST:  14m OMNIPAQUE IOHEXOL 300 MG/ML  SOLN  COMPARISON:  None.  FINDINGS: CT CHEST FINDINGS  Mediastinum/Nodes: No axillary, mediastinal or hilar lymphadenopathy. 6 mm right peritracheal lymph node (image 11; series 3). Normal heart size. No pericardial effusion. Aorta and main pulmonary artery are normal in caliber.  Lungs/Pleura: The central airways are patent. Patchy ground-glass and consolidative opacities within the left lower lobe. Minimal peripheral consolidative opacities  within the left upper lobe. No pleural effusion or pneumothorax.  Musculoskeletal: No aggressive or acute appearing osseous lesions.  CT ABDOMEN AND PELVIS FINDINGS  Hepatobiliary: The liver is normal in size and contour without focal hepatic lesion identified. The gallbladder is surgically absent. No intrahepatic or extrahepatic biliary ductal dilatation.  Pancreas: Unremarkable  Spleen: Mild splenomegaly measuring 15 cm.  Adrenals/Urinary Tract: There is an indeterminate 1.9 cm left adrenal lesion. The right adrenal gland is unremarkable. Kidneys are symmetric in size and demonstrate mild patchy enhancement. Urinary bladder is decompressed and poorly evaluated.  Stomach/Bowel: Extensive sigmoid colonic diverticulosis. No CT evidence to suggest acute diverticulitis. No evidence for bowel obstruction. Postoperative changes compatible with gastric bypass procedure. There is suggestion of mild wall thickening of the excluded portion of the stomach.  Vascular/Lymphatic: Normal caliber abdominal aorta. Inferior vena cava filter is present. No retroperitoneal lymphadenopathy.  Other: Mild anasarca. Fat containing hernia within the anterior right lower quadrant. Uterus is unremarkable.  Musculoskeletal: Lumbar spine degenerative changes. No aggressive or acute appearing osseous lesions.  IMPRESSION: 1. There is suggestion of mild patchy enhancement of the kidneys bilaterally, this may potentially be technical in etiology given the quantum mottle artifact on the study however underlying infectious process is not excluded. Additionally there is suggestion of mild wall thickening of the decompressed urinary bladder. Recommend correlation with urinalysis to exclude the possibility of infection. 2. Suggestion of mild wall thickening of the excluded portion of the stomach status post gastric bypass surgery. This  may be normal for this patient however in the absence of priors, gastritis is not excluded. 3. Minimal ground-glass  and consolidative opacities within the left lower lobe favored represent atelectasis. Infection not excluded. 4. Splenomegaly. 5. Indeterminate left adrenal lesion. Recommend comparison with outside priors or followup MRI in 12 months.   Electronically Signed   By: Lovey Newcomer M.D.   On: 08/01/2014 18:52    Labs:  CBC:  Recent Labs  03/23/14 1052 07/31/14 0926 08/01/14 0608 08/02/14 0613  WBC 4.3 0.7* 2.1* 6.5  HGB 12.6 11.7* 9.2* 9.6*  HCT 38.6 35.1* 27.8* 28.7*  PLT 149* 93* 74* 118*    COAGS:  Recent Labs  08/02/14 0613  INR 1.07  APTT 34    BMP:  Recent Labs  06/23/14 1242 07/31/14 0926 08/01/14 0608 08/02/14 0613  NA 145* 138 140 137  K 4.0 3.4* 3.8 3.3*  CL 106 103 110 107  CO2 _0 GLUCOSE 78 148* 90 84  BUN _1 CALCIUM 8.9 8.4 7.5* 8.1*  CREATININE 0.72 0.97 0.65 0.65  GFRNONAA 97 66* >90 >90  GFRAA 112 76* >90 >90    LIVER FUNCTION TESTS:  Recent Labs  03/23/14 1052 06/23/14 1242 07/31/14 0926 08/01/14 0608  BILITOT 1.1 0.6 1.9* 1.3*  AST _2 ALT 34* _3 ALKPHOS 111 98 85 54  PROT 6.6 6.0 6.8 5.2*  ALBUMIN  --   --  3.7 2.6*    Assessment and Plan: Patient with history of febrile neutropenia with pancytopenia  and previously diagnosed stage III follicular lymphoma . Request is now received for CT-guided bone marrow biopsy for further evaluation.Risks and benefits discussed with the patient including, but not limited to bleeding, infection, damage to adjacent structures or low yield requiring additional tests. All of the patient's questions were answered, patient is agreeable to proceed. Consent signed and in chart.    Signed: Autumn Messing 08/02/2014, 9:08 AM   I spent a total of 15 minutes in face to face in clinical consultation/evaluation, greater than 50% of which was counseling/coordinating care for CT-guided bone marrow biopsy

## 2014-08-03 DIAGNOSIS — E876 Hypokalemia: Secondary | ICD-10-CM

## 2014-08-03 DIAGNOSIS — R0902 Hypoxemia: Secondary | ICD-10-CM

## 2014-08-03 LAB — CBC
HEMATOCRIT: 27.3 % — AB (ref 36.0–46.0)
HEMOGLOBIN: 9.4 g/dL — AB (ref 12.0–15.0)
MCH: 32.1 pg (ref 26.0–34.0)
MCHC: 34.4 g/dL (ref 30.0–36.0)
MCV: 93.2 fL (ref 78.0–100.0)
PLATELETS: 101 10*3/uL — AB (ref 150–400)
RBC: 2.93 MIL/uL — ABNORMAL LOW (ref 3.87–5.11)
RDW: 12.1 % (ref 11.5–15.5)
WBC: 6.5 10*3/uL (ref 4.0–10.5)

## 2014-08-03 LAB — BASIC METABOLIC PANEL
Anion gap: 6 (ref 5–15)
BUN: 6 mg/dL (ref 6–23)
CO2: 27 mmol/L (ref 19–32)
Calcium: 8.2 mg/dL — ABNORMAL LOW (ref 8.4–10.5)
Chloride: 107 mmol/L (ref 96–112)
Creatinine, Ser: 0.66 mg/dL (ref 0.50–1.10)
Glucose, Bld: 93 mg/dL (ref 70–99)
POTASSIUM: 3.2 mmol/L — AB (ref 3.5–5.1)
Sodium: 140 mmol/L (ref 135–145)

## 2014-08-03 LAB — HAPTOGLOBIN: Haptoglobin: 143 mg/dL (ref 34–200)

## 2014-08-03 MED ORDER — GUAIFENESIN-DM 100-10 MG/5ML PO SYRP: 5.0000 mL | ORAL_SOLUTION | ORAL | Status: DC | PRN

## 2014-08-03 MED ORDER — DM-GUAIFENESIN ER 30-600 MG PO TB12: 1.0000 | ORAL_TABLET | Freq: Two times a day (BID) | ORAL | Status: DC

## 2014-08-03 MED ORDER — POTASSIUM CHLORIDE CRYS ER 20 MEQ PO TBCR
40.0000 meq | EXTENDED_RELEASE_TABLET | Freq: Once | ORAL | Status: AC
  Administered 2014-08-03: 40 meq via ORAL
  Filled 2014-08-03: qty 2

## 2014-08-03 MED ORDER — HYDROCODONE-HOMATROPINE 5-1.5 MG/5ML PO SYRP: 5.0000 mL | ORAL_SOLUTION | Freq: Four times a day (QID) | ORAL | Status: DC | PRN

## 2014-08-03 MED ORDER — DOXYCYCLINE HYCLATE 100 MG PO TABS: 100.0000 mg | ORAL_TABLET | Freq: Two times a day (BID) | ORAL | Status: DC

## 2014-08-03 MED ORDER — MAGNESIUM SULFATE 2 GM/50ML IV SOLN
2.0000 g | Freq: Once | INTRAVENOUS | Status: AC
  Administered 2014-08-03: 2 g via INTRAVENOUS
  Filled 2014-08-03: qty 50

## 2014-08-03 NOTE — Progress Notes (Signed)
ANTIBIOTIC CONSULT NOTE  Pharmacy Consult for Cefepime Indication: SIRS and neutropenic fever (resolved)  Allergies  Allergen Reactions  . Advicor [Niacin-Lovastatin Er] Rash   Patient Measurements: Height: 5\' 1"  (154.9 cm) Weight: 213 lb 13.5 oz (97 kg) IBW/kg (Calculated) : 47.8  Vital Signs: Temp: 98 F (36.7 C) (04/21 0632) Temp Source: Oral (04/21 7829) BP: 118/62 mmHg (04/21 5621) Pulse Rate: 63 (04/21 3086) Intake/Output from previous day: 04/20 0701 - 04/21 0700 In: 120 [P.O.:120] Out: 750 [Urine:750] Intake/Output from this shift:    Labs:  Recent Labs  08/01/14 0608 08/02/14 0613 08/03/14 0637  WBC 2.1* 6.5 6.5  HGB 9.2* 9.6* 9.4*  PLT 74* 118* 101*  CREATININE 0.65 0.65 0.66   Estimated Creatinine Clearance: 87.7 mL/min (by C-G formula based on Cr of 0.66). No results for input(s): VANCOTROUGH, VANCOPEAK, VANCORANDOM, GENTTROUGH, GENTPEAK, GENTRANDOM, TOBRATROUGH, TOBRAPEAK, TOBRARND, AMIKACINPEAK, AMIKACINTROU, AMIKACIN in the last 72 hours.   Microbiology: Recent Results (from the past 720 hour(s))  Rapid strep screen     Status: None   Collection Time: 07/31/14  9:20 AM  Result Value Ref Range Status   Streptococcus, Group A Screen (Direct) NEGATIVE NEGATIVE Final    Comment: (NOTE) A Rapid Antigen test may result negative if the antigen level in the sample is below the detection level of this test. The FDA has not cleared this test as a stand-alone test therefore the rapid antigen negative result has reflexed to a Group A Strep culture.   Culture, Group A Strep     Status: None   Collection Time: 07/31/14  9:20 AM  Result Value Ref Range Status   Strep A Culture Negative  Final    Comment: (NOTE) Performed At: [facility] [place], [place] [id number] Lindon Romp MD BM:[id number]   Blood culture (routine x 2)     Status: None (Preliminary result)   Collection Time: 07/31/14 10:30 AM  Result Value Ref Range  Status   Specimen Description BLOOD LEFT HAND  Final   Special Requests   Final    BOTTLES DRAWN AEROBIC AND ANAEROBIC 5CC  IMMUNE:COMPROMISED   Culture NO GROWTH 1 DAY  Final   Report Status PENDING  Incomplete  Blood culture (routine x 2)     Status: None (Preliminary result)   Collection Time: 07/31/14 10:30 AM  Result Value Ref Range Status   Specimen Description BLOOD RIGHT HAND  Final   Special Requests   Final    BOTTLES DRAWN AEROBIC AND ANAEROBIC AEB=6CC ANA=4CC  IMMUNE:COMPROMISED   Culture NO GROWTH 1 DAY  Final   Report Status PENDING  Incomplete    Medical History: Past Medical History  Diagnosis Date  . History of syncope   . Depression   . PVC's (premature ventricular contractions)   . Chronic pain   . Chronic back pain   . Chronic neck pain   . Peripheral edema   . Hyperlipidemia   . Anemia   . Neuropathy   . SVT (supraventricular tachycardia)   . Diabetes mellitus without complication 2/72/5366    patient denies being diabetic, no meds  . Lymphoma   . Seizures    Anti-infectives    Start     Dose/Rate Route Frequency Ordered Stop   07/31/14 2200  ceFEPIme (MAXIPIME) 1 g in dextrose 5 % 50 mL IVPB     1 g 100 mL/hr over 30 Minutes Intravenous 3 times per day 07/31/14 1358     07/31/14  2100  vancomycin (VANCOCIN) IVPB 1000 mg/200 mL premix  Status:  Discontinued     1,000 mg 200 mL/hr over 60 Minutes Intravenous Every 12 hours 07/31/14 1348 08/02/14 1610   07/31/14 1445  doxycycline (VIBRA-TABS) tablet 100 mg     100 mg Oral Every 12 hours 07/31/14 1434     07/31/14 1100  vancomycin (VANCOCIN) 1,500 mg in sodium chloride 0.9 % 500 mL IVPB    Comments:  * Loading dose to be given in ED *   1,500 mg 250 mL/hr over 120 Minutes Intravenous  Once 07/31/14 1038 07/31/14 1338   07/31/14 1045  ceFEPIme (MAXIPIME) 2 g in dextrose 5 % 50 mL IVPB     2 g 100 mL/hr over 30 Minutes Intravenous  Once 07/31/14 1036 07/31/14 1132     Assessment: 54yo female  admitted with c/o fever and sore throat.  Asked to initiate Vancomycin and Cefepime for neutropenic fever and suspected pna.  Estimated Creatinine Clearance: 87.7 mL/min (by C-G formula based on Cr of 0.66).  Pt received initial loading dose of Vancomycin 1500mg  and Cefepime 2gm in ED on admission.  Vancomycin stopped on 4/20 primary team.  WBC improved, afebrile.  Goal of Therapy:  Vancomycin trough level 15-20 mcg/ml  Plan:   Continue Cefepime 1gm IV q8hrs  Monitor labs, renal fxn, and cultures  Consider change Doxycycline to PO if/when appropriate.  Pricilla Larsson 08/03/2014,8:44 AM

## 2014-08-03 NOTE — Progress Notes (Signed)
Late entry for 08/02/2014  Subjective: Patient seen in room.  She had a BM mess.  She notes some nausea.  Her counts are reviewed.    I personally reviewed and went over laboratory results with the patient.  The results are noted within this dictation.  I spoke with nurse about anti-emetics and clean wash clothes for cleaning.  Objective: Vital signs in last 24 hours: Temp:  [98 F (36.7 C)-98.8 F (37.1 C)] 98 F (36.7 C) (04/21 1287) Pulse Rate:  [52-74] 63 (04/21 0632) Resp:  [9-20] 18 (04/21 8676) BP: (98-133)/(41-81) 118/62 mmHg (04/21 0632) SpO2:  [92 %-100 %] 97 % (04/21 7209)  Intake/Output from previous day: 04/20 0800 - 04/21 0759 In: 120 [P.O.:120] Out: 750 [Urine:750] Intake/Output this shift: Total I/O In: 120 [P.O.:120] Out: 750 [Urine:750]  General appearance: alert, cooperative, appears stated age and no distress  Lab Results:   Recent Labs  08/02/14 0613 08/03/14 0637  WBC 6.5 6.5  HGB 9.6* 9.4*  HCT 28.7* 27.3*  PLT 118* 101*   BMET  Recent Labs  08/02/14 0613 08/03/14 0637  NA 137 140  K 3.3* 3.2*  CL 107 107  CO2 24 27  GLUCOSE 84 93  BUN 10 6  CREATININE 0.65 0.66  CALCIUM 8.1* 8.2*    Studies/Results: Ct Soft Tissue Neck W Contrast  08/01/2014   CLINICAL DATA:  Febrile neutropenia. Pancytopenia. History of follicular lymphoma status post chemotherapy.  EXAM: CT NECK WITH CONTRAST  TECHNIQUE: Multidetector CT imaging of the neck was performed using the standard protocol following the bolus administration of intravenous contrast.  CONTRAST:  Cervical spine CT 09/21/2011  COMPARISON:  None.  FINDINGS: Pharynx and larynx: There is mild symmetric prominence of the soft tissues posteriorly in the nasopharynx, new from the prior cervical spine CT. There is also mild symmetric prominence of the palatine tonsillar soft tissues which is similar to the prior CT. No discrete pharyngeal is identified. Larynx is unremarkable.  Salivary glands:  Submandibular and parotid glands are unremarkable.  Thyroid: Unremarkable.  Lymph nodes: No enlarged lymph nodes are identified in the neck.  Vascular: Major vascular structures of the neck are grossly patent. Right internal carotid artery is tortuous. Minimal atherosclerotic calcification is noted.  Limited intracranial: The visualized portion of the brain is unremarkable.  Visualized orbits: Prior bilateral cataract extraction.  Mastoids and visualized paranasal sinuses: Mild mucosal thickening involving multiple left ethmoid air cells. Mastoid air cells are clear.  Skeleton: Mild cervical spondylosis. No lytic or blastic osseous lesions identified.  Upper chest: Evaluated on concurrent dedicated chest CT.  IMPRESSION: No enlarged cervical lymph nodes. Symmetric prominence of the nasopharyngeal and palatine tonsillar soft tissues, mildly increased from the prior cervical spine CT and nonspecific. Lymphomaotus involvement not completely excluded.   Electronically Signed   By: Logan Bores   On: 08/01/2014 18:43   Ct Chest W Contrast  08/01/2014   CLINICAL DATA:  Febrile neutropenia. History of follicular lymphoma status post chemotherapy.  EXAM: CT CHEST, ABDOMEN, AND PELVIS WITH CONTRAST  TECHNIQUE: Multidetector CT imaging of the chest, abdomen and pelvis was performed following the standard protocol during bolus administration of intravenous contrast.  CONTRAST:  192mL OMNIPAQUE IOHEXOL 300 MG/ML  SOLN  COMPARISON:  None.  FINDINGS: CT CHEST FINDINGS  Mediastinum/Nodes: No axillary, mediastinal or hilar lymphadenopathy. 6 mm right peritracheal lymph node (image 11; series 3). Normal heart size. No pericardial effusion. Aorta and main pulmonary artery are normal in caliber.  Lungs/Pleura: The  central airways are patent. Patchy ground-glass and consolidative opacities within the left lower lobe. Minimal peripheral consolidative opacities within the left upper lobe. No pleural effusion or pneumothorax.   Musculoskeletal: No aggressive or acute appearing osseous lesions.  CT ABDOMEN AND PELVIS FINDINGS  Hepatobiliary: The liver is normal in size and contour without focal hepatic lesion identified. The gallbladder is surgically absent. No intrahepatic or extrahepatic biliary ductal dilatation.  Pancreas: Unremarkable  Spleen: Mild splenomegaly measuring 15 cm.  Adrenals/Urinary Tract: There is an indeterminate 1.9 cm left adrenal lesion. The right adrenal gland is unremarkable. Kidneys are symmetric in size and demonstrate mild patchy enhancement. Urinary bladder is decompressed and poorly evaluated.  Stomach/Bowel: Extensive sigmoid colonic diverticulosis. No CT evidence to suggest acute diverticulitis. No evidence for bowel obstruction. Postoperative changes compatible with gastric bypass procedure. There is suggestion of mild wall thickening of the excluded portion of the stomach.  Vascular/Lymphatic: Normal caliber abdominal aorta. Inferior vena cava filter is present. No retroperitoneal lymphadenopathy.  Other: Mild anasarca. Fat containing hernia within the anterior right lower quadrant. Uterus is unremarkable.  Musculoskeletal: Lumbar spine degenerative changes. No aggressive or acute appearing osseous lesions.  IMPRESSION: 1. There is suggestion of mild patchy enhancement of the kidneys bilaterally, this may potentially be technical in etiology given the quantum mottle artifact on the study however underlying infectious process is not excluded. Additionally there is suggestion of mild wall thickening of the decompressed urinary bladder. Recommend correlation with urinalysis to exclude the possibility of infection. 2. Suggestion of mild wall thickening of the excluded portion of the stomach status post gastric bypass surgery. This may be normal for this patient however in the absence of priors, gastritis is not excluded. 3. Minimal ground-glass and consolidative opacities within the left lower lobe favored  represent atelectasis. Infection not excluded. 4. Splenomegaly. 5. Indeterminate left adrenal lesion. Recommend comparison with outside priors or followup MRI in 12 months.   Electronically Signed   By: Lovey Newcomer M.D.   On: 08/01/2014 18:52   Ct Abdomen Pelvis W Contrast  08/01/2014   CLINICAL DATA:  Febrile neutropenia. History of follicular lymphoma status post chemotherapy.  EXAM: CT CHEST, ABDOMEN, AND PELVIS WITH CONTRAST  TECHNIQUE: Multidetector CT imaging of the chest, abdomen and pelvis was performed following the standard protocol during bolus administration of intravenous contrast.  CONTRAST:  145mL OMNIPAQUE IOHEXOL 300 MG/ML  SOLN  COMPARISON:  None.  FINDINGS: CT CHEST FINDINGS  Mediastinum/Nodes: No axillary, mediastinal or hilar lymphadenopathy. 6 mm right peritracheal lymph node (image 11; series 3). Normal heart size. No pericardial effusion. Aorta and main pulmonary artery are normal in caliber.  Lungs/Pleura: The central airways are patent. Patchy ground-glass and consolidative opacities within the left lower lobe. Minimal peripheral consolidative opacities within the left upper lobe. No pleural effusion or pneumothorax.  Musculoskeletal: No aggressive or acute appearing osseous lesions.  CT ABDOMEN AND PELVIS FINDINGS  Hepatobiliary: The liver is normal in size and contour without focal hepatic lesion identified. The gallbladder is surgically absent. No intrahepatic or extrahepatic biliary ductal dilatation.  Pancreas: Unremarkable  Spleen: Mild splenomegaly measuring 15 cm.  Adrenals/Urinary Tract: There is an indeterminate 1.9 cm left adrenal lesion. The right adrenal gland is unremarkable. Kidneys are symmetric in size and demonstrate mild patchy enhancement. Urinary bladder is decompressed and poorly evaluated.  Stomach/Bowel: Extensive sigmoid colonic diverticulosis. No CT evidence to suggest acute diverticulitis. No evidence for bowel obstruction. Postoperative changes compatible with  gastric bypass procedure. There is suggestion of  mild wall thickening of the excluded portion of the stomach.  Vascular/Lymphatic: Normal caliber abdominal aorta. Inferior vena cava filter is present. No retroperitoneal lymphadenopathy.  Other: Mild anasarca. Fat containing hernia within the anterior right lower quadrant. Uterus is unremarkable.  Musculoskeletal: Lumbar spine degenerative changes. No aggressive or acute appearing osseous lesions.  IMPRESSION: 1. There is suggestion of mild patchy enhancement of the kidneys bilaterally, this may potentially be technical in etiology given the quantum mottle artifact on the study however underlying infectious process is not excluded. Additionally there is suggestion of mild wall thickening of the decompressed urinary bladder. Recommend correlation with urinalysis to exclude the possibility of infection. 2. Suggestion of mild wall thickening of the excluded portion of the stomach status post gastric bypass surgery. This may be normal for this patient however in the absence of priors, gastritis is not excluded. 3. Minimal ground-glass and consolidative opacities within the left lower lobe favored represent atelectasis. Infection not excluded. 4. Splenomegaly. 5. Indeterminate left adrenal lesion. Recommend comparison with outside priors or followup MRI in 12 months.   Electronically Signed   By: Lovey Newcomer M.D.   On: 08/01/2014 18:52   Ct Biopsy  08/02/2014   CLINICAL DATA:  Pancytopenia  EXAM: CT-GUIDED BIOPSY BONE MARROW ASPIRATE AND CORE.  MEDICATIONS AND MEDICAL HISTORY: Versed Three mg, Fentanyl 50 mcg.  Additional Medications: None.  ANESTHESIA/SEDATION: Moderate sedation time: 11 minutes  PROCEDURE: The procedure, risks, benefits, and alternatives were explained to the patient. Questions regarding the procedure were encouraged and answered. The patient understands and consents to the procedure.  The back was prepped with Betadine in a sterile fashion, and a  sterile drape was applied covering the operative field. A sterile gown and sterile gloves were used for the procedure.  Under CT guidance, an 11 gauge needle was placed into the right iliac bone via posterior approach. Aspirates and a core were obtain. Final imaging was performed.  Patient tolerated the procedure well without complication. Vital sign monitoring by nursing staff during the procedure will continue as patient is in the special procedures unit for post procedure observation.  FINDINGS: The images document guide needle placement within the right iliac bone. Post biopsy images demonstrate no hemorrhage.  COMPLICATIONS: None  IMPRESSION: Successful CT-guided bone marrow aspirate and core.   Electronically Signed   By: Marybelle Killings M.D.   On: 08/02/2014 16:32    Medications: I have reviewed the patient's current medications.  Assessment/Plan: 1. Pancytopenia, improving. Suspect reactive.  Negative hematologic work-up thus far.  BMBx performed on 08/02/2014 pending.  CT imaging is benign.  Patient and plan discussed with Dr. Ancil Linsey and she is in agreement with the aforementioned.     LOS: 3 days    Mandy Peeks 08/03/2014

## 2014-08-03 NOTE — Discharge Summary (Signed)
Physician Discharge Summary  Terecia Plaut DXA:[id number] DOB: 05-18-1960 DOA: 07/31/2014  PCP: Chevis Pretty, FNP  Admit date: 07/31/2014 Discharge date: 08/03/2014  Time spent: 45 minutes  Recommendations for Outpatient Follow-up:  Patient will be discharged to home.  Patient will need to follow up with primary care provider as well as oncology within one week of discharge.  Patient should have a repeat CBC and BMP at that time. Patient should continue medications as prescribed.  Patient should follow a regular diet.   Discharge Diagnoses:  History of present illness a fever History of lymphoma History of convulsions/seizures Normocytic anemia Hypokalemia SVT Partial knee replacement/history of DVT Morbid obesity Hypoxia  Discharge Condition: stable  Diet recommendation: Regular  Filed Weights   07/31/14 0858 07/31/14 1347  Weight: 97.977 kg (216 lb) 97 kg (213 lb 13.5 oz)    History of present illness:  On 07/31/2014 by Ms. Dyanne Carrel, NP with Dr. Orvan Falconer Money Mckeithan is a 54 y.o. 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 Initial evaluation in the emergency department reveals neutropenia with fever, thrombocytopenia, mild hypokalemia hypotension and hypoxia. Information is obtained from the patient. She was in her usual state of health until about 3 days ago she developed a sore throat and cough. Associated symptoms include generalized weakness, body aches, nausea with no vomiting, fever. She reports that swallowing makes the pain worse nothing makes the symptoms better. She rates the pain in her throat about an 8 out of 10 with swallowing. Describes it as a scratching burning feeling. She denies chest pain palpitations  headache dizziness syncope or near-syncope. She denies decreased by mouth intake abdominal pain diarrhea or constipation. He denies any recent travel or sick contacts. Workup in the emergency department reveals oral temperature of 105, BBC 0.7, hemoglobin 11.7, platelets 93, potassium 3.4 serum glucose 148. Lactic acid 2.4, rapid strep test is negative chest x-ray without acute abnormality. EKG with sinus rhythm and borderline T-wave abnormalities urinalysis with protein many squama this epithelial cells WBCs 3-6 RBC 7-10 bacteria many. In the emergency department she is given 4 L of IV fluids vancomycin and cefepime as well as Toradol and Zofran. At the time of my exam she appears ill but not toxic she is hemodynamically stable with a blood pressure on the low end of normal she is not hypoxic on 2 L nasal cannula  Hospital Course:  Neutropenic fever -Unknown etiology, ?viral source -Concern for lymphoma recurrence -Blood cultures, strep a culture, chest x-ray, urinalysis unremarkable -Patient initially started on cefepime, vancomycin, doxycycline -Patient has remained afebrile for 48 hours, labs have normalized -Oncology consulted and appreciated, feels this may be reactive -Will discharge patient on doxycycline empirically  Cough -Likely secondary to URI -Will discharge patient with antitussives  History of lymphoma -Patient status post bone marrow biopsy on 08/02/2014 -Pending results -Patient will follow up with oncology within 1 week of discharge to discuss results -Patient does follow with [facility]. 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  Cardiovascular: S1 S2 auscultated, no rubs, murmurs or gallops. Regular rate and rhythm.  Respiratory: Clear to auscultation, no wheezing  Abdomen: Soft, obese, nontender, nondistended, + bowel sounds  Extremities: warm dry without cyanosis clubbing or edema  Neuro: AAOx3, nonfocal  Psych: Normal affect and demeanor with intact judgement and insight  Discharge Instructions      Discharge Instructions    Discharge instructions    Complete by:  As directed   Patient will be discharged to home.  Patient will need to follow up with primary care provider as well as oncology within one week of discharge.  Patient should have a repeat CBC and BMP at that time. Patient should continue medications as prescribed.  Patient should follow a regular diet.            Medication List    STOP taking these medications        multivitamins ther. w/minerals Tabs tablet      TAKE these medications        atorvastatin 40 MG tablet  Commonly known as:  LIPITOR  Take 1 tablet (40 mg total) by mouth at bedtime.     cyanocobalamin 1000 MCG/ML injection  Commonly known as:  (VITAMIN B-12)  Inject 1 mL (1,000 mcg total) into the muscle every 30 (thirty) days.     doxycycline 100 MG tablet  Commonly known as:  VIBRA-TABS  Take 1 tablet (100 mg total) by mouth every 12 (twelve) hours.     ESTROVEN PMS Tabs  Take 1 tablet by mouth daily.     fentaNYL 75 MCG/HR  Commonly known as:   DURAGESIC - dosed mcg/hr  Place 1 patch (75 mcg total) onto the skin every 3 (three) days.     FLUoxetine 40 MG capsule  Commonly known as:  PROZAC  Take 2 capsules (80 mg total) by mouth daily.     fluticasone 50 MCG/ACT nasal spray  Commonly known as:  FLONASE  PLACE 2 SPRAYS INTO THE NOSE AS NEEDED. FOR ALLERGIES     furosemide 40 MG tablet  Commonly known as:  LASIX  TAKE 1 TABLET (40 MG TOTAL) BY MOUTH DAILY.     gabapentin 300 MG capsule  Commonly known as:  NEURONTIN  TAKE 2 CAPSULE BY MOUTH 2 TIMES DAILY     guaiFENesin-dextromethorphan 100-10 MG/5ML syrup  Commonly known as:  ROBITUSSIN DM  Take 5 mLs by mouth every 4 (four) hours as needed for cough.     HYDROcodone-acetaminophen 10-325 MG per tablet  Commonly known as:  NORCO  Take 1 tablet by mouth every 4 (four) hours as needed for moderate pain.     HYDROcodone-homatropine 5-1.5 MG/5ML syrup  Commonly known as:  HYCODAN  Take 5 mLs by mouth every 6 (six) hours as needed for cough.     INTEGRA 62.5-62.5-40-3 MG Caps  TAKE ONE CAPSULE BY MOUTH EVERY DAY     levETIRAcetam 500 MG tablet  Commonly known as:  KEPPRA  TAKE 2 TABLETS BY MOUTH 2 TIMES DAILY     loratadine 10 MG tablet  Commonly known as:  CLARITIN  Take 10 mg by mouth as needed. For allergies     multivitamin with minerals Tabs tablet  Take 1 tablet by mouth daily.     nitroGLYCERIN 0.4 MG SL tablet  Commonly known as:  NITROSTAT  Place 1 tablet (0.4 mg total) under the tongue every 5 (five) minutes as needed for chest pain.     potassium chloride SA 20 MEQ tablet  Commonly known as:  K-DUR,KLOR-CON  Take 1 tablet (20 mEq total) by mouth 2 (two) times daily.     tetrahydrozoline 0.05 % ophthalmic solution  Apply 1 drop to eye.     vitamin C 1000 MG tablet  Take 1,000 mg by mouth daily.     XARELTO 10 MG Tabs tablet  Generic drug:  rivaroxaban  Take 1 tablet (10 mg total) by mouth daily.       Allergies  Allergen Reactions  .  Advicor [Niacin-Lovastatin Er] Rash   Follow-up Information    Follow up with Chevis Pretty, FNP. Schedule an appointment as soon as possible for a visit in 1 week.   Specialty:  Nurse Practitioner   Why:  Hospital follow up   Contact information:   [place] [address] [phone]       Follow up with KEFALAS,THOMAS, PA-C. Schedule an appointment as soon as possible for a visit in 1 week.   Specialty:  Physician Assistant   Why:  Hospital follow up   Contact information:   [place]  [address] [phone]        The results of significant diagnostics from this hospitalization (including imaging, microbiology, ancillary and laboratory) are listed below for reference.    Significant Diagnostic Studies: Dg Chest 2 View  07/31/2014   CLINICAL DATA:  Fever and cough and back pain.  Sore throat.  EXAM: CHEST  2 VIEW  COMPARISON:  None.  FINDINGS: The heart size and mediastinal contours are within normal limits. Both lungs are clear. The visualized skeletal structures are unremarkable.  IMPRESSION: No acute abnormalities.   Electronically Signed   By: Lorriane Shire M.D.   On: 07/31/2014 10:58   Ct Soft Tissue Neck W Contrast  08/01/2014   CLINICAL DATA:  Febrile neutropenia. Pancytopenia. History of follicular lymphoma status post chemotherapy.  EXAM: CT NECK WITH CONTRAST  TECHNIQUE: Multidetector CT imaging of the neck was performed using the standard protocol following the bolus administration of intravenous contrast.  CONTRAST:  Cervical spine CT 09/21/2011  COMPARISON:  None.  FINDINGS: Pharynx and larynx: There is mild symmetric prominence of the soft tissues posteriorly in the nasopharynx, new from the prior cervical spine CT. There is also mild symmetric prominence of the palatine tonsillar soft tissues which is similar to the prior CT. No discrete pharyngeal is identified. Larynx is unremarkable.  Salivary glands: Submandibular and parotid  glands are unremarkable.  Thyroid: Unremarkable.  Lymph nodes: No enlarged lymph nodes are identified in the neck.  Vascular: Major vascular structures of the neck are grossly patent. Right internal carotid artery is tortuous. Minimal atherosclerotic calcification is noted.  Limited intracranial: The visualized portion of the brain is unremarkable.  Visualized orbits: Prior bilateral cataract extraction.  Mastoids and visualized paranasal sinuses: Mild mucosal thickening involving multiple left ethmoid air cells. Mastoid air cells are clear.  Skeleton: Mild cervical spondylosis. No  lytic or blastic osseous lesions identified.  Upper chest: Evaluated on concurrent dedicated chest CT.  IMPRESSION: No enlarged cervical lymph nodes. Symmetric prominence of the nasopharyngeal and palatine tonsillar soft tissues, mildly increased from the prior cervical spine CT and nonspecific. Lymphomaotus involvement not completely excluded.   Electronically Signed   By: Logan Bores   On: 08/01/2014 18:43   Ct Chest W Contrast  08/01/2014   CLINICAL DATA:  Febrile neutropenia. History of follicular lymphoma status post chemotherapy.  EXAM: CT CHEST, ABDOMEN, AND PELVIS WITH CONTRAST  TECHNIQUE: Multidetector CT imaging of the chest, abdomen and pelvis was performed following the standard protocol during bolus administration of intravenous contrast.  CONTRAST:  129m OMNIPAQUE IOHEXOL 300 MG/ML  SOLN  COMPARISON:  None.  FINDINGS: CT CHEST FINDINGS  Mediastinum/Nodes: No axillary, mediastinal or hilar lymphadenopathy. 6 mm right peritracheal lymph node (image 11; series 3). Normal heart size. No pericardial effusion. Aorta and main pulmonary artery are normal in caliber.  Lungs/Pleura: The central airways are patent. Patchy ground-glass and consolidative opacities within the left lower lobe. Minimal peripheral consolidative opacities within the left upper lobe. No pleural effusion or pneumothorax.  Musculoskeletal: No aggressive or  acute appearing osseous lesions.  CT ABDOMEN AND PELVIS FINDINGS  Hepatobiliary: The liver is normal in size and contour without focal hepatic lesion identified. The gallbladder is surgically absent. No intrahepatic or extrahepatic biliary ductal dilatation.  Pancreas: Unremarkable  Spleen: Mild splenomegaly measuring 15 cm.  Adrenals/Urinary Tract: There is an indeterminate 1.9 cm left adrenal lesion. The right adrenal gland is unremarkable. Kidneys are symmetric in size and demonstrate mild patchy enhancement. Urinary bladder is decompressed and poorly evaluated.  Stomach/Bowel: Extensive sigmoid colonic diverticulosis. No CT evidence to suggest acute diverticulitis. No evidence for bowel obstruction. Postoperative changes compatible with gastric bypass procedure. There is suggestion of mild wall thickening of the excluded portion of the stomach.  Vascular/Lymphatic: Normal caliber abdominal aorta. Inferior vena cava filter is present. No retroperitoneal lymphadenopathy.  Other: Mild anasarca. Fat containing hernia within the anterior right lower quadrant. Uterus is unremarkable.  Musculoskeletal: Lumbar spine degenerative changes. No aggressive or acute appearing osseous lesions.  IMPRESSION: 1. There is suggestion of mild patchy enhancement of the kidneys bilaterally, this may potentially be technical in etiology given the quantum mottle artifact on the study however underlying infectious process is not excluded. Additionally there is suggestion of mild wall thickening of the decompressed urinary bladder. Recommend correlation with urinalysis to exclude the possibility of infection. 2. Suggestion of mild wall thickening of the excluded portion of the stomach status post gastric bypass surgery. This may be normal for this patient however in the absence of priors, gastritis is not excluded. 3. Minimal ground-glass and consolidative opacities within the left lower lobe favored represent atelectasis. Infection not  excluded. 4. Splenomegaly. 5. Indeterminate left adrenal lesion. Recommend comparison with outside priors or followup MRI in 12 months.   Electronically Signed   By: DLovey NewcomerM.D.   On: 08/01/2014 18:52   Ct Abdomen Pelvis W Contrast  08/01/2014   CLINICAL DATA:  Febrile neutropenia. History of follicular lymphoma status post chemotherapy.  EXAM: CT CHEST, ABDOMEN, AND PELVIS WITH CONTRAST  TECHNIQUE: Multidetector CT imaging of the chest, abdomen and pelvis was performed following the standard protocol during bolus administration of intravenous contrast.  CONTRAST:  1536mOMNIPAQUE IOHEXOL 300 MG/ML  SOLN  COMPARISON:  None.  FINDINGS: CT CHEST FINDINGS  Mediastinum/Nodes: No axillary, mediastinal or hilar lymphadenopathy. 6 mm  right peritracheal lymph node (image 11; series 3). Normal heart size. No pericardial effusion. Aorta and main pulmonary artery are normal in caliber.  Lungs/Pleura: The central airways are patent. Patchy ground-glass and consolidative opacities within the left lower lobe. Minimal peripheral consolidative opacities within the left upper lobe. No pleural effusion or pneumothorax.  Musculoskeletal: No aggressive or acute appearing osseous lesions.  CT ABDOMEN AND PELVIS FINDINGS  Hepatobiliary: The liver is normal in size and contour without focal hepatic lesion identified. The gallbladder is surgically absent. No intrahepatic or extrahepatic biliary ductal dilatation.  Pancreas: Unremarkable  Spleen: Mild splenomegaly measuring 15 cm.  Adrenals/Urinary Tract: There is an indeterminate 1.9 cm left adrenal lesion. The right adrenal gland is unremarkable. Kidneys are symmetric in size and demonstrate mild patchy enhancement. Urinary bladder is decompressed and poorly evaluated.  Stomach/Bowel: Extensive sigmoid colonic diverticulosis. No CT evidence to suggest acute diverticulitis. No evidence for bowel obstruction. Postoperative changes compatible with gastric bypass procedure. There is  suggestion of mild wall thickening of the excluded portion of the stomach.  Vascular/Lymphatic: Normal caliber abdominal aorta. Inferior vena cava filter is present. No retroperitoneal lymphadenopathy.  Other: Mild anasarca. Fat containing hernia within the anterior right lower quadrant. Uterus is unremarkable.  Musculoskeletal: Lumbar spine degenerative changes. No aggressive or acute appearing osseous lesions.  IMPRESSION: 1. There is suggestion of mild patchy enhancement of the kidneys bilaterally, this may potentially be technical in etiology given the quantum mottle artifact on the study however underlying infectious process is not excluded. Additionally there is suggestion of mild wall thickening of the decompressed urinary bladder. Recommend correlation with urinalysis to exclude the possibility of infection. 2. Suggestion of mild wall thickening of the excluded portion of the stomach status post gastric bypass surgery. This may be normal for this patient however in the absence of priors, gastritis is not excluded. 3. Minimal ground-glass and consolidative opacities within the left lower lobe favored represent atelectasis. Infection not excluded. 4. Splenomegaly. 5. Indeterminate left adrenal lesion. Recommend comparison with outside priors or followup MRI in 12 months.   Electronically Signed   By: Lovey Newcomer M.D.   On: 08/01/2014 18:52   Ct Biopsy  08/02/2014   CLINICAL DATA:  Pancytopenia  EXAM: CT-GUIDED BIOPSY BONE MARROW ASPIRATE AND CORE.  MEDICATIONS AND MEDICAL HISTORY: Versed Three mg, Fentanyl 50 mcg.  Additional Medications: None.  ANESTHESIA/SEDATION: Moderate sedation time: 11 minutes  PROCEDURE: The procedure, risks, benefits, and alternatives were explained to the patient. Questions regarding the procedure were encouraged and answered. The patient understands and consents to the procedure.  The back was prepped with Betadine in a sterile fashion, and a sterile drape was applied covering  the operative field. A sterile gown and sterile gloves were used for the procedure.  Under CT guidance, an 11 gauge needle was placed into the right iliac bone via posterior approach. Aspirates and a core were obtain. Final imaging was performed.  Patient tolerated the procedure well without complication. Vital sign monitoring by nursing staff during the procedure will continue as patient is in the special procedures unit for post procedure observation.  FINDINGS: The images document guide needle placement within the right iliac bone. Post biopsy images demonstrate no hemorrhage.  COMPLICATIONS: None  IMPRESSION: Successful CT-guided bone marrow aspirate and core.   Electronically Signed   By: Marybelle Killings M.D.   On: 08/02/2014 16:32    Microbiology: Recent Results (from the past 240 hour(s))  Rapid strep screen  Status: None   Collection Time: 07/31/14  9:20 AM  Result Value Ref Range Status   Streptococcus, Group A Screen (Direct) NEGATIVE NEGATIVE Final    Comment: (NOTE) A Rapid Antigen test may result negative if the antigen level in the sample is below the detection level of this test. The FDA has not cleared this test as a stand-alone test therefore the rapid antigen negative result has reflexed to a Group A Strep culture.   Culture, Group A Strep     Status: None   Collection Time: 07/31/14  9:20 AM  Result Value Ref Range Status   Strep A Culture Negative  Final    Comment: (NOTE) Performed At: [facility] [place], [place] [id number] Lindon Romp MD FQ:[id number]   Blood culture (routine x 2)     Status: None (Preliminary result)   Collection Time: 07/31/14 10:30 AM  Result Value Ref Range Status   Specimen Description BLOOD LEFT HAND  Final   Special Requests   Final    BOTTLES DRAWN AEROBIC AND ANAEROBIC 5CC  IMMUNE:COMPROMISED   Culture NO GROWTH 1 DAY  Final   Report Status PENDING  Incomplete  Blood culture (routine x 2)     Status: None  (Preliminary result)   Collection Time: 07/31/14 10:30 AM  Result Value Ref Range Status   Specimen Description BLOOD RIGHT HAND  Final   Special Requests   Final    BOTTLES DRAWN AEROBIC AND ANAEROBIC AEB=6CC ANA=4CC  IMMUNE:COMPROMISED   Culture NO GROWTH 1 DAY  Final   Report Status PENDING  Incomplete     Labs: Basic Metabolic Panel:  Recent Labs Lab 07/31/14 0920 07/31/14 0926 08/01/14 0608 08/02/14 0613 08/03/14 0637  NA  --  138 140 137 140  K  --  3.4* 3.8 3.3* 3.2*  CL  --  103 110 107 107  CO2  --  _0 GLUCOSE  --  148* 90 84 93  BUN  --  _1 CREATININE  --  0.97 0.65 0.65 0.66  CALCIUM  --  8.4 7.5* 8.1* 8.2*  MG 1.6  --   --  1.6  --    Liver Function Tests:  Recent Labs Lab 07/31/14 0926 08/01/14 0608  AST 23 20  ALT 23 20  ALKPHOS 85 54  BILITOT 1.9* 1.3*  PROT 6.8 5.2*  ALBUMIN 3.7 2.6*   No results for input(s): LIPASE, AMYLASE in the last 168 hours. No results for input(s): AMMONIA in the last 168 hours. CBC:  Recent Labs Lab 07/31/14 0926 08/01/14 0608 08/02/14 0613 08/03/14 0637  WBC 0.7* 2.1* 6.5 6.5  NEUTROABS 0.1*  --   --   --   HGB 11.7* 9.2* 9.6* 9.4*  HCT 35.1* 27.8* 28.7* 27.3*  MCV 92.6 93.9 93.8 93.2  PLT 93* 74* 118* 101*   Cardiac Enzymes: No results for input(s): CKTOTAL, CKMB, CKMBINDEX, TROPONINI in the last 168 hours. BNP: BNP (last 3 results) No results for input(s): BNP in the last 8760 hours.  ProBNP (last 3 results) No results for input(s): PROBNP in the last 8760 hours.  CBG: No results for input(s): GLUCAP in the last 168 hours.     SignedCristal Ford  [facility] 08/03/2014, 10:10 AM

## 2014-08-03 NOTE — Discharge Instructions (Signed)
Neutropenic Fever °Neutropenic fever is a type of fever that can develop in someone who has a very low number of a certain kind of white blood cells called neutrophils (neutropenia). These blood cells are important for fighting infections caused by bacteria and fungi. When you have neutropenia, you could be in danger of a severe infection. You may need to start taking antibiotic medicines. °CAUSES °Neutrophils are made in the spongy tissue inside your bones (bone marrow). Anything that damages your bone marrow or damages neutrophils after they leave your bone marrow can cause neutropenia. Once you have a dangerously low level of neutrophils, you are at risk for infection and neutropenic fever. °Causes of neutropenia may include: °· Cancer treatments. °· Bone marrow cancer. °· Cancer of the white blood cells (leukemia or myeloma). °· Severe infection. °· Bone marrow failure (aplastic anemia). °· Many types of medicines. °· Diseases of the body's defense system (autoimmune diseases). °· Inherited genes that cause neutropenia. °· Vitamin B deficiency. °· Spleen enlargement in rheumatoid arthritis (Felty syndrome). °SIGNS AND SYMPTOMS °Fever is the main symptom of neutropenic fever. Other signs and symptoms may include: °· Chills. °· Fatigue. °· Painful mouth ulcers. °· Cough. °· Shortness of breath. °· Swollen glands (lymph nodes). °· Sore throat. °· Sinus and ear infections. °· Gum disease. °· Skin infection. °· Burning and frequent urination. °· Rectal infections. °· Vaginal discharge or itching. °DIAGNOSIS  °Your health care provider may diagnose neutropenic fever if your neutrophil count is less than 500 neutrophils per microliter of blood and you have a fever of at least 100.4°F (38.0°C).  °· Blood tests and other tests that measure neutrophils will be done. These may include: °¨ A complete blood count (CBC) and a differential white blood count (WBC). °¨ Peripheral smear. This test involves checking a blood sample  under a microscope. °· Other types of tests may also be done, including: °¨ Chest X-rays. °¨ Cultures of blood and body fluids to look for a source of infection. °Your health care provider will also determine if your neutropenic fever is high risk or low risk. °· You may have high-risk neutropenic fever if: °¨ Your neutrophil count is less than 100 neutrophils per microliter of blood. °¨ You have also been diagnosed with pneumonia or another serious medical problem. °¨ Your condition requires you to be treated in the hospital. °· You may have low-risk neutropenic fever if: °¨ Your neutrophil count is more than 100 neutrophils per microliter of blood. °¨ Your chest X-ray is normal. °¨ You do not have an active illness or any other problems that require you to be in the hospital. °TREATMENT  °You may start treatment as soon as you get diagnosed with neutropenic fever, even if your health care provider is still looking for the source of infection. °· Treatment for high-risk neutropenic fever is antibiotic medicine given through an IV access tube. This is done in the hospital. You may be given a single antibiotic or a combination of antibiotics. °· Low-risk neutropenic fever may be treated at home. You may have to take one or two different oral antibiotics. In some cases, you may need to be treated with IV antibiotics that are given by a home health care provider who visits your home. °· If your health care provider finds a specific cause of infection, you may be switched to the antibiotics that work best against those particular bacteria. °· If a fungal infection is found, your medicine will be changed to an antifungal   medicine. °· If the fever goes away in 3-5 days, you may have to take medicine for about 7 days. If the fever is not responding, you may have to take medicine longer. °· You may have to stop taking any medicine that could be causing neutropenic fever. °· If you have neutropenic fever from cancer  treatment drugs (chemotherapy), you may need to take a type of medicine called white blood cell growth factors. This medicine can help prevent fever. °HOME CARE INSTRUCTIONS °· Only take medicines as directed by your health care provider. °¨ If you are being treated with oral antibiotics at home, you may need to return to your health care provider every day to have your CBC checked. You may have to do this until your fever responds. °¨ Take your antibiotics as directed. Make sure you finish them even if you start to feel better. °· Preventing infection is important when you have neutropenia. Here are some ways to prevent infections: °¨ Avoid sick friends and family members. °¨ Wash your hands often. °¨ Do not eat uncooked or undercooked meats. °¨ Wash all fruits and vegetables. °¨ Do not eat or drink unpasteurized dairy products. °¨ Get regular dental care, and maintain good dental hygiene. °¨ Get a flu shot. Ask your health care provider whether you need any other vaccines. °¨ Wear gloves when gardening. °· Follow up with your health care provider as directed. °SEEK MEDICAL CARE IF: °· You have chills. °· You have a fever. °· You have signs or symptoms of infection. °SEEK IMMEDIATE MEDICAL CARE IF: °· You have trouble breathing. °· You have chest pain. °Document Released: 04/05/2013 Document Reviewed: 04/05/2013 °ExitCare® Patient Information ©2015 [employer]. This information is not intended to replace advice given to you by your health care provider. Make sure you discuss any questions you have with your health care provider. ° °

## 2014-08-03 NOTE — Progress Notes (Signed)
Patient with orders to be discharge home. Discharge instructions given, patient verbalized understanding. Patient stable. Patient left in private vehicle with spouse.  

## 2014-08-09 ENCOUNTER — Ambulatory Visit (HOSPITAL_COMMUNITY): Payer: Medicaid Other | Admitting: Hematology & Oncology

## 2014-08-09 LAB — CHROMOSOME ANALYSIS, BONE MARROW

## 2014-08-09 LAB — TISSUE HYBRIDIZATION (BONE MARROW)-NCBH

## 2014-08-10 ENCOUNTER — Ambulatory Visit (INDEPENDENT_AMBULATORY_CARE_PROVIDER_SITE_OTHER): Payer: Medicaid Other | Admitting: Nurse Practitioner

## 2014-08-10 ENCOUNTER — Encounter: Payer: Self-pay | Admitting: Nurse Practitioner

## 2014-08-10 VITALS — BP 151/94 | HR 52 | Temp 98.2°F | Ht 61.0 in | Wt 213.0 lb

## 2014-08-10 DIAGNOSIS — Z09 Encounter for follow-up examination after completed treatment for conditions other than malignant neoplasm: Secondary | ICD-10-CM

## 2014-08-10 DIAGNOSIS — D709 Neutropenia, unspecified: Secondary | ICD-10-CM

## 2014-08-10 DIAGNOSIS — G5691 Unspecified mononeuropathy of right upper limb: Secondary | ICD-10-CM

## 2014-08-10 LAB — POCT CBC
Granulocyte percent: 71.3 %G (ref 37–80)
HCT, POC: 32.5 % — AB (ref 37.7–47.9)
Hemoglobin: 10.6 g/dL — AB (ref 12.2–16.2)
Lymph, poc: 1.2 (ref 0.6–3.4)
MCH, POC: 30.3 pg (ref 27–31.2)
MCHC: 32.8 g/dL (ref 31.8–35.4)
MCV: 92.6 fL (ref 80–97)
MPV: 5.7 fL (ref 0–99.8)
PLATELET COUNT, POC: 252 10*3/uL (ref 142–424)
POC Granulocyte: 3.9 (ref 2–6.9)
POC LYMPH %: 21.7 % (ref 10–50)
RBC: 3.5 M/uL — AB (ref 4.04–5.48)
RDW, POC: 12.8 %
WBC: 5.5 10*3/uL (ref 4.6–10.2)

## 2014-08-10 MED ORDER — FENTANYL 75 MCG/HR TD PT72: 75.0000 ug | MEDICATED_PATCH | TRANSDERMAL | Status: DC

## 2014-08-10 NOTE — Progress Notes (Signed)
   Subjective:    Patient ID: Angela Michael, female    DOB: Nov 16, 1960, 54 y.o.   MRN: [id number]  HPI Patien there today for hospital follow up. She was in the hospital because her WBC count dropped and she was shaky with fever and coughing. She was there for 4 days on IV antibiotics. Discharged on doxycycline. Her only complaint today is that she has no energy at all. Records have been sent to oncologist at [facility] rechecked today.    Review of Systems  Constitutional: Positive for fatigue. Negative for fever and chills.  HENT: Negative.   Respiratory: Negative.   Cardiovascular: Negative.   Gastrointestinal: Negative.   Genitourinary: Negative.   Neurological: Negative.   Psychiatric/Behavioral: Negative.   All other systems reviewed and are negative.      Objective:   Physical Exam  Constitutional: She is oriented to person, place, and time. She appears well-developed and well-nourished. No distress.  HENT:  Right Ear: External ear normal.  Left Ear: External ear normal.  Nose: Nose normal.  Mouth/Throat: Oropharynx is clear and moist.  Eyes: Pupils are equal, round, and reactive to light.  Neck: Normal range of motion. Neck supple.  Cardiovascular: Normal rate, regular rhythm and normal heart sounds.   Pulmonary/Chest: Effort normal and breath sounds normal.  Neurological: She is alert and oriented to person, place, and time.  Skin: Skin is warm.  Psychiatric: She has a normal mood and affect. Her behavior is normal. Judgment and thought content normal.   BP 151/94 mmHg  Pulse 52  Temp(Src) 98.2 F (36.8 C) (Oral)  Ht 5\' 1"  (1.549 m)  Wt 213 lb (96.616 kg)  BMI 40.27 kg/m2        Assessment & Plan:   1. Hospital discharge follow-up   2. Neutropenia   hospital records reviewed [facility] fluids Labs pending RTO prn  Mary-Margaret Hassell Done, FNP

## 2014-08-10 NOTE — Addendum Note (Signed)
Addended by: Chevis Pretty on: 08/10/2014 11:39 AM   Modules accepted: Orders

## 2014-08-10 NOTE — Patient Instructions (Signed)
Neutropenia Neutropenia is a condition that occurs when the level of a certain type of white blood cell (neutrophil) in your body becomes lower than normal. Neutrophils are made in the bone marrow and fight infections. These cells protect against bacteria and viruses. The fewer neutrophils you have, and the longer your body remains without them, the greater your risk of getting a severe infection becomes. CAUSES  The cause of neutropenia may be hard to determine. However, it is usually due to 3 main problems:   Decreased production of neutrophils. This may be due to:  Certain medicines such as chemotherapy.  Genetic problems.  Cancer.  Radiation treatments.  Vitamin deficiency.  Some pesticides.  Increased destruction of neutrophils. This may be due to:  Overwhelming infections.  Hemolytic anemia. This is when the body destroys its own blood cells.  Chemotherapy.  Neutrophils moving to areas of the body where they cannot fight infections. This may be due to:  Dialysis procedures.  Conditions where the spleen becomes enlarged. Neutrophils are held in the spleen and are not available to the rest of the body.  Overwhelming infections. The neutrophils are held in the area of the infection and are not available to the rest of the body. SYMPTOMS  There are no specific symptoms of neutropenia. The lack of neutrophils can result in an infection, and an infection can cause various problems. DIAGNOSIS  Diagnosis is made by a blood test. A complete blood count is performed. The normal level of neutrophils in human blood differs with age and race. Infants have lower counts than older children and adults. African Americans have lower counts than Caucasians or Asians. The average adult level is 1500 cells/mm3 of blood. Neutrophil counts are interpreted as follows:  Greater than 1000 cells/mm3 gives normal protection against infection.  500 to 1000 cells/mm3 gives an increased risk for  infection.  200 to 500 cells/mm3 is a greater risk for severe infection.  Lower than 200 cells/mm3 is a marked risk of infection. This may require hospitalization and treatment with antibiotic medicines. TREATMENT  Treatment depends on the underlying cause, severity, and presence of infections or symptoms. It also depends on your health. Your caregiver will discuss the treatment plan with you. Mild cases are often easily treated and have a good outcome. Preventative measures may also be started to limit your risk of infections. Treatment can include:  Taking antibiotics.  Stopping medicines that are known to cause neutropenia.  Correcting nutritional deficiencies by eating green vegetables to supply folic acid and taking vitamin B supplements.  Stopping exposure to pesticides if your neutropenia is related to pesticide exposure.  Taking a blood growth factor called sargramostim, pegfilgrastim, or filgrastim if you are undergoing chemotherapy for cancer. This stimulates white blood cell production.  Removal of the spleen if you have Felty's syndrome and have repeated infections. HOME CARE INSTRUCTIONS   Follow your caregiver's instructions about when you need to have blood work done.  Wash your hands often. Make sure others who come in contact with you also wash their hands.  Wash raw fruits and vegetables before eating them. They can carry bacteria and fungi.  Avoid people with colds or spreadable (contagious) diseases (chickenpox, herpes zoster, influenza).  Avoid large crowds.  Avoid construction areas. The dust can release fungus into the air.  Be cautious around children in daycare or school environments.  Take care of your respiratory system by coughing and deep breathing.  Bathe daily.  Protect your skin from cuts and   burns.  Do not work in the garden or with flowers and plants.  Care for the mouth before and after meals by brushing with a soft toothbrush. If you have  mucositis, do not use mouthwash. Mouthwash contains alcohol and can dry out the mouth even more.  Clean the area between the genitals and the anus (perineal area) after urination and bowel movements. Women need to wipe from front to back.  Use a water soluble lubricant during sexual intercourse and practice good hygiene after. Do not have intercourse if you are severely neutropenic. Check with your caregiver for guidelines.  Exercise daily as tolerated.  Avoid people who were vaccinated with a live vaccine in the past 30 days. You should not receive live vaccines (polio, typhoid).  Do not provide direct care for pets. Avoid animal droppings. Do not clean litter boxes and bird cages.  Do not share food utensils.  Do not use tampons, enemas, or rectal suppositories unless directed by your caregiver.  Use an electric razor to remove hair.  Wash your hands after handling magazines, letters, and newspapers. SEEK IMMEDIATE MEDICAL CARE IF:   You have a fever.  You have chills or start to shake.  You feel nauseous or vomit.  You develop mouth sores.  You develop aches and pains.  You have redness and swelling around open wounds.  Your skin is warm to the touch.  You have pus coming from your wounds.  You develop swollen lymph nodes.  You feel weak or fatigued.  You develop red streaks on the skin. MAKE SURE YOU:  Understand these instructions.  Will watch your condition.  Will get help right away if you are not doing well or get worse. Document Released: 09/20/2001 Document Revised: 06/23/2011 Document Reviewed: 10/18/2010 ExitCare Patient Information 2015 [employer]. This information is not intended to replace advice given to you by your health care provider. Make sure you discuss any questions you have with your health care provider.  

## 2014-08-14 LAB — CULTURE, BLOOD (ROUTINE X 2)
CULTURE: NO GROWTH
Culture: NO GROWTH

## 2014-09-04 ENCOUNTER — Telehealth: Payer: Self-pay | Admitting: Nurse Practitioner

## 2014-09-04 DIAGNOSIS — G5691 Unspecified mononeuropathy of right upper limb: Secondary | ICD-10-CM

## 2014-09-04 MED ORDER — FENTANYL 75 MCG/HR TD PT72: 75.0000 ug | MEDICATED_PATCH | TRANSDERMAL | Status: DC

## 2014-09-04 NOTE — Telephone Encounter (Signed)
Left message for pt that Rx requested was ready for pick up Rx to the front

## 2014-09-04 NOTE — Telephone Encounter (Signed)
Fentanyl patch rx ready for pick up

## 2014-09-06 ENCOUNTER — Encounter: Payer: Self-pay | Admitting: Cardiovascular Disease

## 2014-09-06 ENCOUNTER — Ambulatory Visit (INDEPENDENT_AMBULATORY_CARE_PROVIDER_SITE_OTHER): Payer: Medicaid Other | Admitting: Cardiovascular Disease

## 2014-09-06 VITALS — BP 118/88 | HR 56 | Ht 61.75 in | Wt 209.0 lb

## 2014-09-06 DIAGNOSIS — R079 Chest pain, unspecified: Secondary | ICD-10-CM

## 2014-09-06 DIAGNOSIS — C8290 Follicular lymphoma, unspecified, unspecified site: Secondary | ICD-10-CM

## 2014-09-06 DIAGNOSIS — Z9889 Other specified postprocedural states: Secondary | ICD-10-CM

## 2014-09-06 DIAGNOSIS — Z8249 Family history of ischemic heart disease and other diseases of the circulatory system: Secondary | ICD-10-CM

## 2014-09-06 DIAGNOSIS — Z95828 Presence of other vascular implants and grafts: Secondary | ICD-10-CM

## 2014-09-06 DIAGNOSIS — E785 Hyperlipidemia, unspecified: Secondary | ICD-10-CM | POA: Diagnosis not present

## 2014-09-06 DIAGNOSIS — Z87898 Personal history of other specified conditions: Secondary | ICD-10-CM

## 2014-09-06 DIAGNOSIS — Z9289 Personal history of other medical treatment: Secondary | ICD-10-CM

## 2014-09-06 DIAGNOSIS — I471 Supraventricular tachycardia: Secondary | ICD-10-CM | POA: Diagnosis not present

## 2014-09-06 NOTE — Patient Instructions (Signed)
Your physician wants you to follow-up in: 6 months with Dr. Virgina Jock will receive a reminder letter in the mail two months in advance. If you don't receive a letter, please call our office to schedule the follow-up appointment.  Your physician recommends that you continue on your current medications as directed. Please refer to the Current Medication list given to you today.  WE HAVE ADDED ASPIRIN 81 MG TO YOUR MEDICATION LIST  You have been referred to [place]  Thank you for choosing [facility]!!

## 2014-09-06 NOTE — Progress Notes (Signed)
Patient ID: Angela Michael, female   DOB: [birth year], 54 y.o.   MRN: [id number]      SUBJECTIVE: The patient presents for follow up of SVT. 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 She has nonobstructive coronary artery disease as per coronary angiography performed in November 2010 at [facility], following a false positive stress test. At that time, cath on 02/16/09 showed proximal LAD 25%, prox RCA 25%, and long proximal-mid 50% lesion. LV gram EF 60% (normal).  She was hospitalized in April 2016 for a neutropenic fever of unknown etiology. Oncology was consulted and it was felt to be reactive. She also developed a DVT after partial knee replacement in January 2016 and is now on Xarelto.  The patient has occasional retrosternal chest pressure when lying down, but denies exertional chest pain. She has palpitations on occasion. Denies shortness of breath, lightheadedness, dizziness, leg swelling, orthopnea, PND, and syncope.  Event monitor worn between 5/4 and 5/17 demonstrated sinus rhythm and sinus tachycardia with occasional PACs. The fastest heart rate was 129 bpm. No SVT was seen.  She says she has an IVC filter which is retractable and wonders if it can be removed. It was not seen with CT abdomen and pelvis on 4/19. She underwent a bone marrow biopsy and results are pending. She follows with Dr. Nelda Marseille at [facility].  She has knee pain and is scheduled to see orthopedics on 6/6. She has not been able to garden like she wants to. She takes aspirin 325 mg daily.  Lipid panel on 3/11 showed normal LDL particle number and normal LDL C of 64. Total cholesterol was 139.   Review of Systems: As per "subjective", otherwise negative.  Allergies  Allergen Reactions  . Advicor [Niacin-Lovastatin Er] Rash    Current Outpatient Prescriptions  Medication Sig Dispense Refill  . Ascorbic Acid (VITAMIN C)  1000 MG tablet Take 1,000 mg by mouth daily.     Marland Kitchen atorvastatin (LIPITOR) 40 MG tablet Take 1 tablet (40 mg total) by mouth at bedtime. 30 tablet 5  . cyanocobalamin (,VITAMIN B-12,) 1000 MCG/ML injection Inject 1 mL (1,000 mcg total) into the muscle every 30 (thirty) days. 30 mL 12  . Fe Fum-FePoly-Vit C-Vit B3 (INTEGRA) 62.5-62.5-40-3 MG CAPS TAKE ONE CAPSULE BY MOUTH EVERY DAY 30 capsule 5  . fentaNYL (DURAGESIC - DOSED MCG/HR) 75 MCG/HR Place 1 patch (75 mcg total) onto the skin every 3 (three) days. 10 patch 0  . FLUoxetine (PROZAC) 40 MG capsule Take 2 capsules (80 mg total) by mouth daily. 60 capsule 5  . fluticasone (FLONASE) 50 MCG/ACT nasal spray PLACE 2 SPRAYS INTO THE NOSE AS NEEDED. FOR ALLERGIES 16 g 5  . furosemide (LASIX) 40 MG tablet TAKE 1 TABLET (40 MG TOTAL) BY MOUTH DAILY. 30 tablet 5  . gabapentin (NEURONTIN) 300 MG capsule TAKE 2 CAPSULE BY MOUTH 2 TIMES DAILY 120 capsule 5  . HYDROcodone-acetaminophen (NORCO) 10-325 MG per tablet Take 1 tablet by mouth every 4 (four) hours as needed for moderate pain.   0  . levETIRAcetam (KEPPRA) 500 MG tablet TAKE 2 TABLETS BY MOUTH 2 TIMES DAILY 120 tablet 5  . loratadine (CLARITIN) 10 MG tablet Take 10 mg by mouth as needed. For allergies    . Multiple Vitamin (MULTIVITAMIN WITH MINERALS) TABS Take 1 tablet by mouth daily.    . nitroGLYCERIN (NITROSTAT) 0.4 MG SL tablet Place 1 tablet (0.4  mg total) under the tongue every 5 (five) minutes as needed for chest pain. 25 tablet 3  . Nutritional Supplements (ESTROVEN PMS) TABS Take 1 tablet by mouth daily.     Alveda Reasons 10 MG TABS tablet Take 1 tablet (10 mg total) by mouth daily. 30 tablet 5   No current facility-administered medications for this visit.    Past Medical History  Diagnosis Date  . History of syncope   . Depression   . PVC's (premature ventricular contractions)   . Chronic pain   . Chronic back pain   . Chronic neck pain   . Peripheral edema   . Hyperlipidemia   .  Anemia   . Neuropathy   . SVT (supraventricular tachycardia)   . Diabetes mellitus without complication 1/61/0960    patient denies being diabetic, no meds  . Lymphoma   . Seizures     Past Surgical History  Procedure Laterality Date  . Gastric bypass      Says she had lost 345 lbs  . Venous thrombectomy      Blood clot resection from right arm  . Lymph node dissection Left     Resected  . Skin surgery      Removal of excess skin  . Vena cava filter placement    . Lymph node removed from stomach    . Cholecystectomy    . Cataract extraction Right   . Endovenous ablation saphenous vein w/ laser Right 09-08-2013    EVLA right small saphenous vein and stab phlebectomy 10-20 incisions right leg by Curt Jews MD  . Medial partial knee replacement  05/10/14    left knee    History   Social History  . Marital Status: Married    Spouse Name: N/A  . Number of Children: 2  . Years of Education: N/A   Occupational History  . Currently does not work    Social History Main Topics  . Smoking status: Never Smoker   . Smokeless tobacco: Never Used  . Alcohol Use: 0.0 oz/week    0 Standard drinks or equivalent per week     Comment: Occasionally  . Drug Use: No  . Sexual Activity: Not on file   Other Topics Concern  . Not on file   Social History Narrative   Married with 2 children     Filed Vitals:   09/06/14 1033  BP: 118/88  Pulse: 56  Height: 5' 1.75" (1.568 m)  Weight: 209 lb (94.802 kg)    PHYSICAL EXAM General: NAD HEENT: Normal. Neck: No JVD, no thyromegaly. Lungs: Clear to auscultation bilaterally with normal respiratory effort. CV: Nondisplaced PMI.  Regular rate and rhythm, normal S1/S2, no S3/S4, no murmur. No pretibial or periankle edema.  No carotid bruit.  Normal pedal pulses.  Abdomen: Soft, nontender, obese, no distention.  Neurologic: Alert and oriented x 3.  Psych: Normal affect. Skin: Normal. Musculoskeletal: No gross  deformities. Extremities: No clubbing or cyanosis.   ECG: Most recent ECG reviewed.      ASSESSMENT AND PLAN: 1. Chest pain: Atypical for CAD as provoked with lying down and not with exertion. Cath results from 02/2009 noted above. Encouraged to reduce ASA from 325 mg to 81 mg. Will continue to monitor for symptoms. Aggressive risk factor modification will continue to be pursued. Continue Lipitor 40 mg daily.   2. SVT: No further recurrences. No medication adjustments at this time.   3. Hyperlipidemia: On Lipitor 40 mg. Results from 06/23/14  noted above. Well controlled.  4. DVT in 04/2014: On Xarelto.  5. S/p IVC filter: Will make referral to interventional radiology to see if removal is a possibility. She believes it is a retractable filter and thinks it has migrate superiorly.  6. Follicular lymphoma stage III, grade 1-2: Managed by Dr. Nelda Marseille at [facility]. Bone marrow biopsy results pending.  Dispo: f/u 6 months.  Time spent: 40 minutes, of which greater than 50% was spent reviewing symptoms, relevant blood tests and studies, and discussing management plan with the patient.   Kate Sable, M.D., F.A.C.C.

## 2014-09-13 ENCOUNTER — Other Ambulatory Visit: Payer: Self-pay | Admitting: Cardiovascular Disease

## 2014-09-13 DIAGNOSIS — Z95828 Presence of other vascular implants and grafts: Secondary | ICD-10-CM

## 2014-09-18 ENCOUNTER — Telehealth: Payer: Self-pay | Admitting: Internal Medicine

## 2014-09-18 NOTE — Telephone Encounter (Signed)
Received records from [facility] forwarded to Dr. Scarlette Shorts 09/18/14 fbg.

## 2014-09-20 ENCOUNTER — Other Ambulatory Visit: Payer: Self-pay | Admitting: Interventional Radiology

## 2014-09-20 ENCOUNTER — Ambulatory Visit
Admission: RE | Admit: 2014-09-20 | Discharge: 2014-09-20 | Disposition: A | Discharge status: Home / Self Care | Payer: Medicaid Other | Source: Ambulatory Visit | Attending: Cardiovascular Disease | Admitting: Cardiovascular Disease

## 2014-09-20 DIAGNOSIS — Z95828 Presence of other vascular implants and grafts: Secondary | ICD-10-CM

## 2014-09-20 NOTE — Consult Note (Signed)
Chief Complaint: Chief Complaint  Patient presents with  . Follow-up    Consult for possible IVC filter retrieval     Referring Physician(s): Koneswaran,Suresh A  History of Present Illness: Angela Michael is a 54 y.o. female referred for consultation regarding IVC filter retrieval. She has a history of right upper extremity DVT in 2005 after an issue with an IV catheter, treated surgically. At the time of her gastric bypass surgery 2007, a retrievable IVC filter was placed. She's had no complications from this. No lower extremity DVT. Recent knee replacement surgery in January 2016, at which time she was started on prophylactic Xarelto. No complications from the anticoagulation. No contraindications to long-term anticoagulation. She has seen information regarding retrievable filters on media, and is interested in having her is removed if appropriate. Recent CT demonstrates retrievable infrarenal IVC filter without strut fracture. There is some tilt to the filter such that the hook lies against the caval wall. 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  Past Medical History  Diagnosis Date  . History of syncope   . Depression   . PVC's (premature ventricular contractions)   . Chronic pain   . Chronic back pain   . Chronic neck pain   . Peripheral edema   . Hyperlipidemia   . Anemia   . Neuropathy   . SVT (supraventricular tachycardia)   . Diabetes mellitus without complication 06/27/1759    patient denies being diabetic, no meds  . Lymphoma   . Seizures     Past Surgical History  Procedure Laterality Date  . Gastric bypass      Says she had lost 345 lbs  . Venous thrombectomy      Blood clot resection from right arm  . Lymph node dissection Left     Resected  . Skin surgery      Removal of excess skin  . Vena cava filter placement    . Lymph node removed from stomach    . Cholecystectomy    . Cataract extraction Right   . Endovenous ablation saphenous vein  w/ laser Right 09-08-2013    EVLA right small saphenous vein and stab phlebectomy 10-20 incisions right leg by Curt Jews MD  . Medial partial knee replacement  05/10/14    left knee    Allergies: Advicor  Medications: Prior to Admission medications   Medication Sig Start Date End Date Taking? Authorizing Provider  Ascorbic Acid (VITAMIN C) 1000 MG tablet Take 1,000 mg by mouth daily.    Yes Historical Provider, MD  aspirin 81 MG tablet Take 81 mg by mouth daily.   Yes Historical Provider, MD  atorvastatin (LIPITOR) 40 MG tablet Take 1 tablet (40 mg total) by mouth at bedtime. 06/23/14  Yes Mary-Margaret Matthieu Loftus Done, FNP  cyanocobalamin (,VITAMIN B-12,) 1000 MCG/ML injection Inject 1 mL (1,000 mcg total) into the muscle every 30 (thirty) days. 12/05/13  Yes Mary-Margaret Murray Guzzetta Done, FNP  Fe Fum-FePoly-Vit C-Vit B3 (INTEGRA) 62.5-62.5-40-3 MG CAPS TAKE ONE CAPSULE BY MOUTH EVERY DAY 06/23/14  Yes Mary-Margaret Shaneal Barasch Done, FNP  fentaNYL (DURAGESIC - DOSED MCG/HR) 75 MCG/HR Place 1 patch (75 mcg total) onto the skin every 3 (three) days. 09/04/14  Yes Mary-Margaret Mily Malecki Done, FNP  FLUoxetine (PROZAC) 40 MG capsule Take 2 capsules (80 mg total) by mouth daily. 06/23/14  Yes Mary-Margaret Ciro Tashiro Done, FNP  fluticasone (FLONASE) 50 MCG/ACT nasal spray PLACE 2 SPRAYS INTO THE NOSE AS NEEDED. FOR ALLERGIES 06/28/14  Yes Chipper Herb, MD  furosemide (LASIX) 40 MG tablet TAKE 1 TABLET (40 MG TOTAL) BY MOUTH DAILY. 06/23/14  Yes Mary-Margaret Anarosa Kubisiak Done, FNP  gabapentin (NEURONTIN) 300 MG capsule TAKE 2 CAPSULE BY MOUTH 2 TIMES DAILY 06/23/14  Yes Mary-Margaret Shaine Newmark Done, FNP  levETIRAcetam (KEPPRA) 500 MG tablet TAKE 2 TABLETS BY MOUTH 2 TIMES DAILY 06/23/14  Yes Mary-Margaret Raghav Verrilli Done, FNP  loratadine (CLARITIN) 10 MG tablet Take 10 mg by mouth as needed. For allergies   Yes Historical Provider, MD  Multiple Vitamin (MULTIVITAMIN WITH MINERALS) TABS Take 1 tablet by mouth daily.   Yes Historical Provider, MD  nitroGLYCERIN (NITROSTAT)  0.4 MG SL tablet Place 1 tablet (0.4 mg total) under the tongue every 5 (five) minutes as needed for chest pain. 03/23/14  Yes Mary-Margaret Tabby Beaston Done, FNP  Nutritional Supplements (ESTROVEN PMS) TABS Take 1 tablet by mouth daily.    Yes Historical Provider, MD  XARELTO 10 MG TABS tablet Take 1 tablet (10 mg total) by mouth daily. 06/23/14  Yes Mary-Margaret Debera Sterba Done, FNP  HYDROcodone-acetaminophen (NORCO) 10-325 MG per tablet Take 1 tablet by mouth every 4 (four) hours as needed for moderate pain.  05/25/14   Historical Provider, MD     Family History  Problem Relation Age of Onset  . Coronary artery disease Neg Hx   . Sudden death Neg Hx   . Cardiomyopathy Neg Hx   . Esophageal cancer Neg Hx   . Stomach cancer Neg Hx   . Liver cancer Father   . Diabetes Father   . Kidney cancer Father   . Heart failure Brother   . Heart attack Brother   . Tuberculosis Maternal Grandmother   . Bone cancer Maternal Grandfather   . Diabetes Paternal Uncle     History   Social History  . Marital Status: Married    Spouse Name: N/A  . Number of Children: 2  . Years of Education: N/A   Occupational History  . Currently does not work    Social History Main Topics  . Smoking status: Never Smoker   . Smokeless tobacco: Never Used  . Alcohol Use: 0.0 oz/week    0 Standard drinks or equivalent per week     Comment: Occasionally  . Drug Use: No  . Sexual Activity: Not on file   Other Topics Concern  . Not on file   Social History Narrative   Married with 2 children    ECOG Status: 0 - Asymptomatic  Review of Systems: A 12 point ROS discussed and pertinent positives are indicated in the HPI above.  All other systems are negative.  Review of Systems  Respiratory: Negative for shortness of breath.   Cardiovascular: Positive for palpitations. Negative for chest pain.  All other systems reviewed and are negative.   Vital Signs: BP 96/61 mmHg  Pulse 53  Temp(Src) 97.8 F (36.6 C) (Oral)   Resp 14  Ht 5' 1.75" (1.568 m)  Wt 209 lb (94.802 kg)  BMI 38.56 kg/m2  SpO2 97%  Physical Exam  Constitutional: She is oriented to person, place, and time. She appears well-developed and well-nourished. No distress.  HENT:  Head: Normocephalic and atraumatic.  Eyes: Conjunctivae and EOM are normal. No scleral icterus.  Neck: Normal range of motion.  Pulmonary/Chest: Effort normal. No stridor. No respiratory distress.  Abdominal: Soft. She exhibits no distension. There is no guarding.  Musculoskeletal: Normal range of motion.  Neurological: She is alert and oriented to person, place, and time. Coordination normal.  Skin: Skin is warm  and dry. She is not diaphoretic. No erythema.  Psychiatric: She has a normal mood and affect. Her behavior is normal. Judgment and thought content normal.  Vitals reviewed.   Mallampati Score:     Imaging: No results found.  Labs:  CBC:  Recent Labs  07/31/14 0926 08/01/14 0608 08/02/14 0613 08/03/14 0637 08/10/14 1151  WBC 0.7* 2.1* 6.5 6.5 5.5  HGB 11.7* 9.2* 9.6* 9.4* 10.6*  HCT 35.1* 27.8* 28.7* 27.3* 32.5*  PLT 93* 74* 118* 101*  --     COAGS:  Recent Labs  08/02/14 0613  INR 1.07  APTT 34    BMP:  Recent Labs  07/31/14 0926 08/01/14 0608 08/02/14 0613 08/03/14 0637  NA 138 140 137 140  K 3.4* 3.8 3.3* 3.2*  CL 103 110 107 107  CO2 26 25 24 27   GLUCOSE 148* 90 84 93  BUN 15 14 10 6   CALCIUM 8.4 7.5* 8.1* 8.2*  CREATININE 0.97 0.65 0.65 0.66  GFRNONAA 66* >90 >90 >90  GFRAA 76* >90 >90 >90    LIVER FUNCTION TESTS:  Recent Labs  03/23/14 1052 06/23/14 1242 07/31/14 0926 08/01/14 0608  BILITOT 1.1 0.6 1.9* 1.3*  AST 29 18 23 20   ALT 34* 20 23 20   ALKPHOS 111 98 85 54  PROT 6.6 6.0 6.8 5.2*  ALBUMIN  --   --  3.7 2.6*    TUMOR MARKERS: No results for input(s): AFPTM, CEA, CA199, CHROMGRNA in the last 8760 hours.  Assessment and Plan:  My impression is that this patient is an appropriate  candidate for consideration of retrievable filter removal. We discussed the pathophysiology of venous   thromboembolism and pulmonary emboli, the rationale behind therapeutic anticoagulation, prophylactic anticoagulation, and caval filtration, and the long-term benefits and possible risks of caval filtration. She has no contraindication to anticoagulation. No lower extremity DVT. Removal would prevent future filter-related complications such as caval occlusion, filter fracture and embolization, and symptomatic caval wall perforation by legs. The tip of the filter may be embedded in the caval wall which can complicate retrievable efforts. We discussed the technique of filter retrieval including maneuvers when the tip is embedded in the caval wall. We discussed possible risks and complications, anticipated benefits, and possibility of leaving the filter in place if removal cannot be safely achieved. She seemed to understand and was motivated to proceed with filter removal. Accordingly, we can set this up as an outpatient procedure at her convenience, with moderate sedation. Thank you for this interesting consult.  I greatly enjoyed meeting Caramia Boutin and look forward to participating in their care.  Signed: Clem Wisenbaker III, DAYNE Tarin Johndrow 09/20/2014, 10:27 AM   I spent a total of  30 Minutes   in face to face in clinical consultation, greater than 50% of which was counseling/coordinating care for retrievable IVC filter.

## 2014-09-28 ENCOUNTER — Ambulatory Visit (HOSPITAL_COMMUNITY): Admission: RE | Admit: 2014-09-28 | Payer: Medicaid Other | Source: Ambulatory Visit

## 2014-10-05 ENCOUNTER — Other Ambulatory Visit: Payer: Self-pay | Admitting: Nurse Practitioner

## 2014-10-05 DIAGNOSIS — G5691 Unspecified mononeuropathy of right upper limb: Secondary | ICD-10-CM

## 2014-10-05 MED ORDER — FENTANYL 75 MCG/HR TD PT72: 75.0000 ug | MEDICATED_PATCH | TRANSDERMAL | Status: DC

## 2014-10-05 NOTE — Telephone Encounter (Signed)
Pt aware written Rx is at front desk ready for pickup  

## 2014-10-05 NOTE — Telephone Encounter (Signed)
Last filled 09/04/2014

## 2014-10-05 NOTE — Telephone Encounter (Signed)
Fentanyl rx ready for pick up

## 2014-10-08 ENCOUNTER — Other Ambulatory Visit: Payer: Self-pay | Admitting: Radiology

## 2014-10-10 ENCOUNTER — Ambulatory Visit (INDEPENDENT_AMBULATORY_CARE_PROVIDER_SITE_OTHER): Payer: Medicaid Other | Admitting: Nurse Practitioner

## 2014-10-10 ENCOUNTER — Other Ambulatory Visit: Payer: Self-pay | Admitting: Radiology

## 2014-10-10 ENCOUNTER — Encounter: Payer: Self-pay | Admitting: Nurse Practitioner

## 2014-10-10 VITALS — BP 125/71 | HR 58 | Temp 97.9°F | Ht 61.75 in | Wt 209.2 lb

## 2014-10-10 DIAGNOSIS — D510 Vitamin B12 deficiency anemia due to intrinsic factor deficiency: Secondary | ICD-10-CM

## 2014-10-10 DIAGNOSIS — G5691 Unspecified mononeuropathy of right upper limb: Secondary | ICD-10-CM | POA: Diagnosis not present

## 2014-10-10 DIAGNOSIS — N390 Urinary tract infection, site not specified: Secondary | ICD-10-CM | POA: Diagnosis not present

## 2014-10-10 DIAGNOSIS — C8590 Non-Hodgkin lymphoma, unspecified, unspecified site: Secondary | ICD-10-CM | POA: Diagnosis not present

## 2014-10-10 DIAGNOSIS — D709 Neutropenia, unspecified: Secondary | ICD-10-CM

## 2014-10-10 DIAGNOSIS — F329 Major depressive disorder, single episode, unspecified: Secondary | ICD-10-CM | POA: Diagnosis not present

## 2014-10-10 DIAGNOSIS — Z01419 Encounter for gynecological examination (general) (routine) without abnormal findings: Secondary | ICD-10-CM | POA: Diagnosis not present

## 2014-10-10 DIAGNOSIS — N3000 Acute cystitis without hematuria: Secondary | ICD-10-CM | POA: Diagnosis not present

## 2014-10-10 DIAGNOSIS — R319 Hematuria, unspecified: Secondary | ICD-10-CM

## 2014-10-10 DIAGNOSIS — Z0000 Encounter for general adult medical examination without abnormal findings: Secondary | ICD-10-CM

## 2014-10-10 DIAGNOSIS — R569 Unspecified convulsions: Secondary | ICD-10-CM | POA: Diagnosis not present

## 2014-10-10 DIAGNOSIS — R5081 Fever presenting with conditions classified elsewhere: Secondary | ICD-10-CM

## 2014-10-10 DIAGNOSIS — E876 Hypokalemia: Secondary | ICD-10-CM

## 2014-10-10 DIAGNOSIS — F32A Depression, unspecified: Secondary | ICD-10-CM

## 2014-10-10 LAB — POCT URINALYSIS DIPSTICK
Bilirubin, UA: NEGATIVE
GLUCOSE UA: NEGATIVE
KETONES UA: NEGATIVE
Nitrite, UA: NEGATIVE
Protein, UA: NEGATIVE
SPEC GRAV UA: 1.015
Urobilinogen, UA: 4
pH, UA: 5

## 2014-10-10 LAB — POCT UA - MICROSCOPIC ONLY
CRYSTALS, UR, HPF, POC: NEGATIVE
Casts, Ur, LPF, POC: NEGATIVE

## 2014-10-10 MED ORDER — FENTANYL 75 MCG/HR TD PT72: 75.0000 ug | MEDICATED_PATCH | TRANSDERMAL | Status: DC

## 2014-10-10 MED ORDER — NITROFURANTOIN MONOHYD MACRO 100 MG PO CAPS: 100.0000 mg | ORAL_CAPSULE | Freq: Two times a day (BID) | ORAL | Status: DC

## 2014-10-10 NOTE — Patient Instructions (Signed)

## 2014-10-10 NOTE — Progress Notes (Signed)
Subjective:    Patient ID: Angela Michael, female    DOB: November 25, 1960, 54 y.o.   MRN: [id number]  HPI Patient in today for pap and follow up of multiple medical problems. SHe has been doing well the last month or so- SHe is still having a lot of fatigue and is still under going treatments for lymphoma. Her weight is on the rise but says that she gets very little exercise. She is still using pain patches for neuropathy in her right upper arm, and it is still quite effective. She has a greenfield filter that has traveled up near heart and they are going to attempt to remove it tomorrow.  Patient Active Problem List   Diagnosis Date Noted  . Pancytopenia   . Neutropenia   . Thrombocytopenia 08/01/2014  . Neutropenia with fever 07/31/2014  . Hypoxia 07/31/2014  . SIRS (systemic inflammatory response syndrome) 07/31/2014  . Lymphoma   . Seizures 12/05/2013  . Heme positive stool 06/22/2013  . Melena 06/22/2013  . Varicose veins of lower extremities with other complications 93/81/8299  . Chest pain 11/12/2012  . Morbid obesity 11/12/2012  . SVT (supraventricular tachycardia)   . Peripheral edema 08/06/2012  . Hyperlipidemia with target LDL less than 100 08/06/2012  . Neuropathy of upper extremity 08/06/2012  . Anemia 08/06/2012  . Hypokalemia 08/06/2012  . STRESS ELECTROCARDIOGRAM, ABNORMAL 01/31/2009  . Depression 11/04/2008  . SYNCOPE, HX OF 11/04/2008    Outpatient Encounter Prescriptions as of 10/10/2014  Medication Sig  . Ascorbic Acid (VITAMIN C) 1000 MG tablet Take 1,000 mg by mouth daily.   Marland Kitchen aspirin 81 MG tablet Take 81 mg by mouth daily.  Marland Kitchen atorvastatin (LIPITOR) 40 MG tablet Take 1 tablet (40 mg total) by mouth at bedtime.  . cyanocobalamin (,VITAMIN B-12,) 1000 MCG/ML injection Inject 1 mL (1,000 mcg total) into the muscle every 30 (thirty) days.  . Fe Fum-FePoly-Vit C-Vit B3 (INTEGRA) 62.5-62.5-40-3 MG CAPS TAKE ONE CAPSULE BY MOUTH EVERY DAY  . fentaNYL (DURAGESIC -  DOSED MCG/HR) 75 MCG/HR Place 1 patch (75 mcg total) onto the skin every 3 (three) days.  Marland Kitchen FLUoxetine (PROZAC) 40 MG capsule Take 2 capsules (80 mg total) by mouth daily.  . fluticasone (FLONASE) 50 MCG/ACT nasal spray PLACE 2 SPRAYS INTO THE NOSE AS NEEDED. FOR ALLERGIES  . furosemide (LASIX) 40 MG tablet TAKE 1 TABLET (40 MG TOTAL) BY MOUTH DAILY.  Marland Kitchen gabapentin (NEURONTIN) 300 MG capsule TAKE 2 CAPSULE BY MOUTH 2 TIMES DAILY  . HYDROcodone-acetaminophen (NORCO) 10-325 MG per tablet Take 1 tablet by mouth every 4 (four) hours as needed for moderate pain.   Marland Kitchen levETIRAcetam (KEPPRA) 500 MG tablet TAKE 2 TABLETS BY MOUTH 2 TIMES DAILY  . loratadine (CLARITIN) 10 MG tablet Take 10 mg by mouth as needed. For allergies  . Multiple Vitamin (MULTIVITAMIN WITH MINERALS) TABS Take 1 tablet by mouth daily.  . nitroGLYCERIN (NITROSTAT) 0.4 MG SL tablet Place 1 tablet (0.4 mg total) under the tongue every 5 (five) minutes as needed for chest pain.  . Nutritional Supplements (ESTROVEN PMS) TABS Take 1 tablet by mouth daily.   Alveda Reasons 10 MG TABS tablet Take 1 tablet (10 mg total) by mouth daily.  . [DISCONTINUED] fentaNYL (DURAGESIC - DOSED MCG/HR) 75 MCG/HR Place 1 patch (75 mcg total) onto the skin every 3 (three) days.   No facility-administered encounter medications on file as of 10/10/2014.       Review of Systems  Constitutional: Positive for  fatigue.  HENT: Negative.   Respiratory: Negative.   Cardiovascular: Negative.   Genitourinary: Negative.   Neurological: Negative.   Psychiatric/Behavioral: Negative.   All other systems reviewed and are negative.      Objective:   Physical Exam  Constitutional: She is oriented to person, place, and time. She appears well-developed and well-nourished.  HENT:  Head: Normocephalic.  Right Ear: Hearing, tympanic membrane, external ear and ear canal normal.  Left Ear: Hearing, tympanic membrane, external ear and ear canal normal.  Nose: Nose  normal.  Mouth/Throat: Uvula is midline and oropharynx is clear and moist.  Eyes: Conjunctivae and EOM are normal. Pupils are equal, round, and reactive to light.  Neck: Normal range of motion and full passive range of motion without pain. Neck supple. No JVD present. Carotid bruit is not present. No thyroid mass and no thyromegaly present.  Cardiovascular: Normal rate, normal heart sounds and intact distal pulses.   No murmur heard. Pulmonary/Chest: Effort normal and breath sounds normal. Right breast exhibits no inverted nipple, no mass, no nipple discharge, no skin change and no tenderness. Left breast exhibits no inverted nipple, no mass, no nipple discharge, no skin change and no tenderness.  Abdominal: Soft. Bowel sounds are normal. She exhibits no mass. There is no tenderness.  Genitourinary: Vagina normal and uterus normal. No breast swelling, tenderness, discharge or bleeding.  bimanual exam-No adnexal masses or tenderness. Cervix parous and pink- no vaginal discharge  Musculoskeletal: Normal range of motion.  Lymphadenopathy:    She has no cervical adenopathy.  Neurological: She is alert and oriented to person, place, and time.  Skin: Skin is warm and dry.  Psychiatric: She has a normal mood and affect. Her behavior is normal. Judgment and thought content normal.    BP 125/71 mmHg  Pulse 58  Temp(Src) 97.9 F (36.6 C) (Oral)  Ht 5' 1.75" (1.568 m)  Wt 209 lb 3.2 oz (94.892 kg)  BMI 38.60 kg/m2       Assessment & Plan:   1. Annual physical exam   2. Neuropathy of upper extremity, right   3. Seizures   4. Neutropenia with fever   5. Morbid obesity   6. Lymphoma   7. Hypokalemia   8. Depression   9. Vitamin B12 deficiency anemia due to intrinsic factor deficiency   10. Encounter for routine gynecological examination   11. Urinary tract infection with hematuria, site unspecified   12. Acute cystitis without hematuria    Meds ordered this encounter  Medications  .  fentaNYL (DURAGESIC - DOSED MCG/HR) 75 MCG/HR    Sig: Place 1 patch (75 mcg total) onto the skin every 3 (three) days.    Dispense:  10 patch    Refill:  0    Do not fill till 11/03/14    Order Specific Question:  Supervising Provider    Answer:  Chipper Herb [[id number]]  . nitrofurantoin, macrocrystal-monohydrate, (MACROBID) 100 MG capsule    Sig: Take 1 capsule (100 mg total) by mouth 2 (two) times daily. 1 po BId    Dispense:  14 capsule    Refill:  0    Order Specific Question:  Supervising Provider    Answer:  Chipper Herb [[id number]]   Orders Placed This Encounter  Procedures  . Urine culture  . CMP14+EGFR  . Lipid panel  . Thyroid Panel With TSH  . POCT urinalysis dipstick  . POCT UA - Microscopic Only   Take medication as prescribe Cotton  underwear Take shower not bath Cranberry juice, yogurt Force fluids AZO over the counter X2 days Culture pending RTO prn   Labs pending  Diet and exercise encouraged Follow up in 3 months  Mary-Margaret Hassell Done, FNP

## 2014-10-11 ENCOUNTER — Encounter (HOSPITAL_COMMUNITY): Payer: Self-pay

## 2014-10-11 ENCOUNTER — Ambulatory Visit (HOSPITAL_COMMUNITY)
Admission: RE | Admit: 2014-10-11 | Discharge: 2014-10-11 | Disposition: A | Discharge status: Home / Self Care | Payer: Medicaid Other | Source: Ambulatory Visit | Attending: Interventional Radiology | Admitting: Interventional Radiology

## 2014-10-11 DIAGNOSIS — Z9884 Bariatric surgery status: Secondary | ICD-10-CM | POA: Diagnosis not present

## 2014-10-11 DIAGNOSIS — E119 Type 2 diabetes mellitus without complications: Secondary | ICD-10-CM | POA: Diagnosis not present

## 2014-10-11 DIAGNOSIS — Z7902 Long term (current) use of antithrombotics/antiplatelets: Secondary | ICD-10-CM | POA: Diagnosis not present

## 2014-10-11 DIAGNOSIS — Z86718 Personal history of other venous thrombosis and embolism: Secondary | ICD-10-CM | POA: Diagnosis not present

## 2014-10-11 DIAGNOSIS — Z8572 Personal history of non-Hodgkin lymphomas: Secondary | ICD-10-CM | POA: Insufficient documentation

## 2014-10-11 DIAGNOSIS — Z95828 Presence of other vascular implants and grafts: Secondary | ICD-10-CM

## 2014-10-11 DIAGNOSIS — E785 Hyperlipidemia, unspecified: Secondary | ICD-10-CM | POA: Diagnosis not present

## 2014-10-11 DIAGNOSIS — Z4689 Encounter for fitting and adjustment of other specified devices: Secondary | ICD-10-CM | POA: Insufficient documentation

## 2014-10-11 DIAGNOSIS — Z79899 Other long term (current) drug therapy: Secondary | ICD-10-CM | POA: Insufficient documentation

## 2014-10-11 DIAGNOSIS — Z96652 Presence of left artificial knee joint: Secondary | ICD-10-CM | POA: Diagnosis not present

## 2014-10-11 DIAGNOSIS — Z7982 Long term (current) use of aspirin: Secondary | ICD-10-CM | POA: Insufficient documentation

## 2014-10-11 LAB — LIPID PANEL
Chol/HDL Ratio: 3 ratio units (ref 0.0–4.4)
Cholesterol, Total: 142 mg/dL (ref 100–199)
HDL: 47 mg/dL (ref >39)
LDL Calculated: 77 mg/dL (ref 0–99)
Triglycerides: 88 mg/dL (ref 0–149)
VLDL CHOLESTEROL CAL: 18 mg/dL (ref 5–40)

## 2014-10-11 LAB — BASIC METABOLIC PANEL
ANION GAP: 9 (ref 5–15)
BUN: 8 mg/dL (ref 6–20)
CALCIUM: 9 mg/dL (ref 8.9–10.3)
CHLORIDE: 107 mmol/L (ref 101–111)
CO2: 28 mmol/L (ref 22–32)
Creatinine, Ser: 0.77 mg/dL (ref 0.44–1.00)
GFR calc Af Amer: >60 mL/min (ref >60)
GFR calc non Af Amer: >60 mL/min (ref >60)
Glucose, Bld: 97 mg/dL (ref 65–99)
Potassium: 3.2 mmol/L — ABNORMAL LOW (ref 3.5–5.1)
Sodium: 144 mmol/L (ref 135–145)

## 2014-10-11 LAB — PROTIME-INR
INR: 1.04 (ref 0.00–1.49)
PROTHROMBIN TIME: 13.8 s (ref 11.6–15.2)

## 2014-10-11 LAB — PAP IG W/ RFLX HPV ASCU: PAP Smear Comment: 0

## 2014-10-11 LAB — CMP14+EGFR
A/G RATIO: 2.1 (ref 1.1–2.5)
ALBUMIN: 3.9 g/dL (ref 3.5–5.5)
ALK PHOS: 115 IU/L (ref 39–117)
ALT: 37 IU/L — ABNORMAL HIGH (ref 0–32)
AST: 41 IU/L — ABNORMAL HIGH (ref 0–40)
BILIRUBIN TOTAL: 0.7 mg/dL (ref 0.0–1.2)
BUN / CREAT RATIO: 14 (ref 9–23)
BUN: 12 mg/dL (ref 6–24)
CO2: 24 mmol/L (ref 18–29)
CREATININE: 0.86 mg/dL (ref 0.57–1.00)
Calcium: 8.9 mg/dL (ref 8.7–10.2)
Chloride: 105 mmol/L (ref 97–108)
GFR, EST AFRICAN AMERICAN: 90 mL/min/{1.73_m2} (ref >59)
GFR, EST NON AFRICAN AMERICAN: 78 mL/min/{1.73_m2} (ref >59)
GLOBULIN, TOTAL: 1.9 g/dL (ref 1.5–4.5)
GLUCOSE: 92 mg/dL (ref 65–99)
Potassium: 3.8 mmol/L (ref 3.5–5.2)
Sodium: 144 mmol/L (ref 134–144)
TOTAL PROTEIN: 5.8 g/dL — AB (ref 6.0–8.5)

## 2014-10-11 LAB — CBC
HEMATOCRIT: 33.6 % — AB (ref 36.0–46.0)
Hemoglobin: 11.5 g/dL — ABNORMAL LOW (ref 12.0–15.0)
MCH: 31.7 pg (ref 26.0–34.0)
MCHC: 34.2 g/dL (ref 30.0–36.0)
MCV: 92.6 fL (ref 78.0–100.0)
Platelets: 126 10*3/uL — ABNORMAL LOW (ref 150–400)
RBC: 3.63 MIL/uL — AB (ref 3.87–5.11)
RDW: 12.6 % (ref 11.5–15.5)
WBC: 3.2 10*3/uL — AB (ref 4.0–10.5)

## 2014-10-11 LAB — THYROID PANEL WITH TSH
Free Thyroxine Index: 2.2 (ref 1.2–4.9)
T3 UPTAKE RATIO: 28 % (ref 24–39)
T4, Total: 7.9 ug/dL (ref 4.5–12.0)
TSH: 1.36 u[IU]/mL (ref 0.450–4.500)

## 2014-10-11 LAB — URINE CULTURE

## 2014-10-11 LAB — APTT: aPTT: 26 seconds (ref 24–37)

## 2014-10-11 MED ORDER — FENTANYL CITRATE (PF) 100 MCG/2ML IJ SOLN
INTRAMUSCULAR | Status: AC
  Filled 2014-10-11: qty 2

## 2014-10-11 MED ORDER — IOHEXOL 300 MG/ML  SOLN
100.0000 mL | Freq: Once | INTRAMUSCULAR | Status: AC | PRN
  Administered 2014-10-11: 10 mL via INTRAVENOUS

## 2014-10-11 MED ORDER — HYDROCODONE-ACETAMINOPHEN 5-325 MG PO TABS: 1.0000 | ORAL_TABLET | ORAL | Status: DC | PRN

## 2014-10-11 MED ORDER — FENTANYL CITRATE (PF) 100 MCG/2ML IJ SOLN
INTRAMUSCULAR | Status: AC | PRN
  Administered 2014-10-11 (×2): 25 ug via INTRAVENOUS
  Administered 2014-10-11: 50 ug via INTRAVENOUS

## 2014-10-11 MED ORDER — MIDAZOLAM HCL 2 MG/2ML IJ SOLN
INTRAMUSCULAR | Status: AC
  Filled 2014-10-11: qty 2

## 2014-10-11 MED ORDER — SODIUM CHLORIDE 0.9 % IV SOLN
Freq: Once | INTRAVENOUS | Status: AC
  Administered 2014-10-11: 07:00:00 via INTRAVENOUS

## 2014-10-11 MED ORDER — IOHEXOL 300 MG/ML  SOLN
100.0000 mL | Freq: Once | INTRAMUSCULAR | Status: AC | PRN
  Administered 2014-10-11: 50 mL via INTRAVENOUS

## 2014-10-11 MED ORDER — LIDOCAINE HCL 1 % IJ SOLN
INTRAMUSCULAR | Status: AC
  Filled 2014-10-11: qty 20

## 2014-10-11 MED ORDER — MIDAZOLAM HCL 2 MG/2ML IJ SOLN
INTRAMUSCULAR | Status: AC | PRN
  Administered 2014-10-11: 0.5 mg via INTRAVENOUS
  Administered 2014-10-11: 1 mg via INTRAVENOUS
  Administered 2014-10-11: 0.5 mg via INTRAVENOUS

## 2014-10-11 NOTE — Sedation Documentation (Signed)
Patient denies pain and is resting comfortably.  

## 2014-10-11 NOTE — Procedures (Signed)
IVC gram, and filter retrieval No complication No blood loss. See complete dictation in [facility].

## 2014-10-11 NOTE — Progress Notes (Deleted)
Dr. Milta Deiters notified of results of pts. CXR.  Instructed to watch pt for 15 min and if we don't hear from him to feed pt and discharge as ordered.

## 2014-10-11 NOTE — Discharge Instructions (Signed)
Vena cava Inferior Vena Cava Filter Insertion, Care After Refer to this sheet in the next few weeks. These instructions provide you with information on caring for yourself after your procedure. Your health care provider may also give you more specific instructions. Your treatment has been planned according to current medical practices, but problems sometimes occur. Call your health care provider if you have any problems or questions after your procedure. WHAT TO EXPECT AFTER THE PROCEDURE After your procedure, it is typical to have the following:  Mild pain in the area where the filter was inserted.  Mild bruising in the area where the filter was inserted. HOME CARE INSTRUCTIONS  You will be given medicine to control pain. Only take over-the-counter or prescription medicines for pain, fever, or discomfort as directed by your health care provider.  A bandage (dressing) has been placed over the insertion site. Follow your health care provider's instructions on how to care for it.  Keep the insertion site clean and dry.  Do not soak in a bath tub or pool until the filter insertion site has healed.  Do not drive if you are taking narcotic pain medicines. Follow your health care provider's instructions about driving.  Do not return to work or school until your health care provider says it is okay.   Keep all follow-up appointments.  SEEK IMMEDIATE MEDICAL CARE IF:  You develop swelling and discoloration or pain in the legs.  Your legs become pale and cold or blue.  You develop shortness of breath, feel faint, or pass out.  You develop chest pain, a cough, or difficulty breathing.  You cough up blood.  You develop a rash or feel you are having problems that may be a side effect of medicines.  You develop weakness, difficulty moving your arms or legs, or balance problems.  You develop problems with speech or vision. Document Released: 01/19/2013 Document Reviewed:  01/19/2013 [facility] Patient Information 2015 [place], [place]. This information is not intended to replace advice given to you by your health care provider. Make sure you discuss any questions you have with your health care provider.

## 2014-10-11 NOTE — H&P (Signed)
Chief Complaint: IVC filter removal request  Referring Physician(s): Koneswaren  History of Present Illness: Angela Michael is a 54 y.o. female   Hx RUE DVT 2005 after IV cath issue Gastric Bypass surgery 2007: pre surgical retrievable Inferior vena cava filter placed [facility] Has had filter since then Recent 04/2014 knee replacement and started on prophylactic Xarelto Has had no complication and no contraindication to anticoagulant Was referred to Dr Vernard Gambles for evaluation and consideration of IVC filter removal See dictation Now scheduled for same   Past Medical History  Diagnosis Date  . History of syncope   . Depression   . PVC's (premature ventricular contractions)   . Chronic pain   . Chronic back pain   . Chronic neck pain   . Peripheral edema   . Hyperlipidemia   . Anemia   . Neuropathy   . SVT (supraventricular tachycardia)   . Diabetes mellitus without complication 06/12/6008    patient denies being diabetic, no meds  . Lymphoma   . Seizures     Past Surgical History  Procedure Laterality Date  . Gastric bypass      Says she had lost 345 lbs  . Venous thrombectomy      Blood clot resection from right arm  . Lymph node dissection Left     Resected  . Skin surgery      Removal of excess skin  . Vena cava filter placement    . Lymph node removed from stomach    . Cholecystectomy    . Cataract extraction Right   . Endovenous ablation saphenous vein w/ laser Right 09-08-2013    EVLA right small saphenous vein and stab phlebectomy 10-20 incisions right leg by Curt Jews MD  . Medial partial knee replacement  05/10/14    left knee    Allergies: Advicor  Medications: Prior to Admission medications   Medication Sig Start Date End Date Taking? Authorizing Provider  Ascorbic Acid (VITAMIN C) 1000 MG tablet Take 1,000 mg by mouth daily.    Yes Historical Provider, MD  aspirin 81 MG tablet Take 81 mg by mouth daily.   Yes  Historical Provider, MD  atorvastatin (LIPITOR) 40 MG tablet Take 1 tablet (40 mg total) by mouth at bedtime. 06/23/14  Yes Mary-Margaret Hassell Done, FNP  cyanocobalamin (,VITAMIN B-12,) 1000 MCG/ML injection Inject 1 mL (1,000 mcg total) into the muscle every 30 (thirty) days. 12/05/13  Yes Mary-Margaret Hassell Done, FNP  Fe Fum-FePoly-Vit C-Vit B3 (INTEGRA) 62.5-62.5-40-3 MG CAPS TAKE ONE CAPSULE BY MOUTH EVERY DAY 06/23/14  Yes Mary-Margaret Hassell Done, FNP  FLUoxetine (PROZAC) 40 MG capsule Take 2 capsules (80 mg total) by mouth daily. 06/23/14  Yes Mary-Margaret Hassell Done, FNP  furosemide (LASIX) 40 MG tablet TAKE 1 TABLET (40 MG TOTAL) BY MOUTH DAILY. 06/23/14  Yes Mary-Margaret Hassell Done, FNP  gabapentin (NEURONTIN) 300 MG capsule TAKE 2 CAPSULE BY MOUTH 2 TIMES DAILY 06/23/14  Yes Mary-Margaret Hassell Done, FNP  levETIRAcetam (KEPPRA) 500 MG tablet TAKE 2 TABLETS BY MOUTH 2 TIMES DAILY 06/23/14  Yes Mary-Margaret Hassell Done, FNP  loratadine (CLARITIN) 10 MG tablet Take 10 mg by mouth as needed. For allergies   Yes Historical Provider, MD  Multiple Vitamin (MULTIVITAMIN WITH MINERALS) TABS Take 1 tablet by mouth daily.   Yes Historical Provider, MD  nitrofurantoin, macrocrystal-monohydrate, (MACROBID) 100 MG capsule Take 1 capsule (100 mg total) by mouth 2 (two) times daily. 1 po BId 10/10/14  Yes Mary-Margaret Hassell Done, FNP  Nutritional Supplements (ESTROVEN  PMS) TABS Take 1 tablet by mouth daily.    Yes Historical Provider, MD  XARELTO 10 MG TABS tablet Take 1 tablet (10 mg total) by mouth daily. 06/23/14  Yes Mary-Margaret Hassell Done, FNP  fentaNYL (DURAGESIC - DOSED MCG/HR) 75 MCG/HR Place 1 patch (75 mcg total) onto the skin every 3 (three) days. 10/10/14   Mary-Margaret Hassell Done, FNP  fluticasone (FLONASE) 50 MCG/ACT nasal spray PLACE 2 SPRAYS INTO THE NOSE AS NEEDED. FOR ALLERGIES 06/28/14   Chipper Herb, MD  nitroGLYCERIN (NITROSTAT) 0.4 MG SL tablet Place 1 tablet (0.4 mg total) under the tongue every 5 (five) minutes as needed  for chest pain. 03/23/14   Mary-Margaret Hassell Done, FNP     Family History  Problem Relation Age of Onset  . Coronary artery disease Neg Hx   . Sudden death Neg Hx   . Cardiomyopathy Neg Hx   . Esophageal cancer Neg Hx   . Stomach cancer Neg Hx   . Liver cancer Father   . Diabetes Father   . Kidney cancer Father   . Heart failure Brother   . Heart attack Brother   . Tuberculosis Maternal Grandmother   . Bone cancer Maternal Grandfather   . Diabetes Paternal Uncle     History   Social History  . Marital Status: Married    Spouse Name: N/A  . Number of Children: 2  . Years of Education: N/A   Occupational History  . Currently does not work    Social History Main Topics  . Smoking status: Never Smoker   . Smokeless tobacco: Never Used  . Alcohol Use: 0.0 oz/week    0 Standard drinks or equivalent per week     Comment: Occasionally  . Drug Use: No  . Sexual Activity: Not on file   Other Topics Concern  . None   Social History Narrative   Married with 2 children    Review of Systems: A 12 point ROS discussed and pertinent positives are indicated in the HPI above.  All other systems are negative.  Review of Systems  Constitutional: Negative for activity change, appetite change and fatigue.  Respiratory: Negative for cough and shortness of breath.   Cardiovascular: Negative for chest pain.  Gastrointestinal: Negative for abdominal pain.  Psychiatric/Behavioral: Negative for behavioral problems and confusion.    Vital Signs: BP 112/57 mmHg  Pulse 40  Temp(Src) 97.9 F (36.6 C) (Oral)  Resp 18  Ht 5' 1.25" (1.556 m)  Wt 209 lb (94.802 kg)  BMI 39.16 kg/m2  SpO2 95%  Physical Exam  Constitutional: She is oriented to person, place, and time. She appears well-nourished.  Cardiovascular: Normal rate, regular rhythm and normal heart sounds.   No murmur heard. Bradycardic----normal for pt  Pulmonary/Chest: Effort normal and breath sounds normal. She has no  wheezes.  Abdominal: Soft. There is no tenderness.  Musculoskeletal: Normal range of motion.  Neurological: She is alert and oriented to person, place, and time.  Skin: Skin is warm and dry.  Psychiatric: She has a normal mood and affect. Her behavior is normal. Judgment and thought content normal.  Nursing note and vitals reviewed.   Mallampati Score:  MD Evaluation Airway: WNL Heart: WNL Abdomen: WNL Chest/ Lungs: WNL ASA  Classification: 2 Mallampati/Airway Score: One  Imaging: No results found.  Labs:  CBC:  Recent Labs  08/01/14 0608 08/02/14 0613 08/03/14 0637 08/10/14 1151 10/11/14 0707  WBC 2.1* 6.5 6.5 5.5 3.2*  HGB 9.2* 9.6* 9.4* 10.6*  11.5*  HCT 27.8* 28.7* 27.3* 32.5* 33.6*  PLT 74* 118* 101*  --  126*    COAGS:  Recent Labs  08/02/14 0613 10/11/14 0707  INR 1.07 1.04  APTT 34 26    BMP:  Recent Labs  08/02/14 0613 08/03/14 0637 10/10/14 0942 10/11/14 0707  NA 137 140 144 144  K 3.3* 3.2* 3.8 3.2*  CL 107 107 105 107  CO2 24 27 24 28   GLUCOSE 84 93 92 97  BUN 10 6 12 8   CALCIUM 8.1* 8.2* 8.9 9.0  CREATININE 0.65 0.66 0.86 0.77  GFRNONAA >90 >90 78 >60  GFRAA >90 >90 90 >60    LIVER FUNCTION TESTS:  Recent Labs  06/23/14 1242 07/31/14 0926 08/01/14 0608 10/10/14 0942  BILITOT 0.6 1.9* 1.3* 0.7  AST 18 23 20  41*  ALT 20 23 20  37*  ALKPHOS 98 85 54 115  PROT 6.0 6.8 5.2* 5.8*  ALBUMIN  --  3.7 2.6*  --     TUMOR MARKERS: No results for input(s): AFPTM, CEA, CA199, CHROMGRNA in the last 8760 hours.  Assessment and Plan:  Consultation with Dr Vernard Gambles 09/20/2014:  My impression is that this patient is an appropriate candidate for consideration of retrievable filter removal. We discussed the pathophysiology of venous thromboembolism and pulmonary emboli, the rationale behind therapeutic anticoagulation, prophylactic anticoagulation, and caval filtration, and the long-term benefits and possible risks of caval filtration.  She has no contraindication to anticoagulation. No lower extremity DVT. Removal would prevent future filter-related complications such as caval occlusion, filter fracture and embolization, and symptomatic caval wall perforation by legs. The tip of the filter may be embedded in the caval wall which can complicate retrievable efforts. We discussed the technique of filter retrieval including maneuvers when the tip is embedded in the caval wall. We discussed possible risks and complications, anticipated benefits, and possibility of leaving the filter in place if removal cannot be safely achieved. She seemed to understand and was motivated to proceed with filter removal.  Consent signed and in chart  Thank you for this interesting consult.  I greatly enjoyed meeting Angela Michael and look forward to participating in their care.  Signed: Melanny Wire A 10/11/2014, 8:11 AM   I spent a total of  20 Minutes   in face to face in clinical consultation, greater than 50% of which was counseling/coordinating care for IVC filter removal

## 2014-10-12 ENCOUNTER — Ambulatory Visit: Payer: Medicaid Other | Admitting: Nurse Practitioner

## 2014-10-20 ENCOUNTER — Telehealth: Payer: Self-pay | Admitting: Nurse Practitioner

## 2014-11-08 ENCOUNTER — Telehealth: Payer: Self-pay | Admitting: Nurse Practitioner

## 2014-11-08 NOTE — Telephone Encounter (Signed)
Instructed patient to stop her Xarelto 2 days before her procedure and to restart it the day of, or after her procedure is completed.

## 2014-11-12 ENCOUNTER — Other Ambulatory Visit: Payer: Self-pay | Admitting: Nurse Practitioner

## 2014-11-29 ENCOUNTER — Other Ambulatory Visit: Payer: Self-pay | Admitting: Nurse Practitioner

## 2014-11-29 DIAGNOSIS — G5691 Unspecified mononeuropathy of right upper limb: Secondary | ICD-10-CM

## 2014-11-30 MED ORDER — FENTANYL 75 MCG/HR TD PT72: 75.0000 ug | MEDICATED_PATCH | TRANSDERMAL | Status: DC

## 2014-11-30 NOTE — Telephone Encounter (Signed)
Left message on voicemail that script is ready for pickup at front desk.

## 2014-11-30 NOTE — Telephone Encounter (Signed)
Fentanyl rx ready for pick up

## 2014-12-07 ENCOUNTER — Other Ambulatory Visit: Payer: Self-pay | Admitting: Nurse Practitioner

## 2015-01-01 ENCOUNTER — Other Ambulatory Visit: Payer: Self-pay | Admitting: Nurse Practitioner

## 2015-01-01 DIAGNOSIS — G5691 Unspecified mononeuropathy of right upper limb: Secondary | ICD-10-CM

## 2015-01-01 MED ORDER — FENTANYL 75 MCG/HR TD PT72: 75.0000 ug | MEDICATED_PATCH | TRANSDERMAL | Status: DC

## 2015-01-03 ENCOUNTER — Other Ambulatory Visit: Payer: Self-pay | Admitting: Nurse Practitioner

## 2015-01-18 ENCOUNTER — Telehealth: Payer: Self-pay | Admitting: Nurse Practitioner

## 2015-01-18 ENCOUNTER — Ambulatory Visit (INDEPENDENT_AMBULATORY_CARE_PROVIDER_SITE_OTHER): Payer: Medicaid Other | Admitting: Nurse Practitioner

## 2015-01-18 ENCOUNTER — Encounter: Payer: Self-pay | Admitting: Nurse Practitioner

## 2015-01-18 VITALS — BP 110/68 | HR 47 | Temp 97.3°F | Ht 61.0 in | Wt 203.0 lb

## 2015-01-18 DIAGNOSIS — R600 Localized edema: Secondary | ICD-10-CM

## 2015-01-18 DIAGNOSIS — G5691 Unspecified mononeuropathy of right upper limb: Secondary | ICD-10-CM

## 2015-01-18 DIAGNOSIS — R609 Edema, unspecified: Secondary | ICD-10-CM | POA: Diagnosis not present

## 2015-01-18 DIAGNOSIS — D696 Thrombocytopenia, unspecified: Secondary | ICD-10-CM

## 2015-01-18 DIAGNOSIS — I471 Supraventricular tachycardia, unspecified: Secondary | ICD-10-CM

## 2015-01-18 DIAGNOSIS — F329 Major depressive disorder, single episode, unspecified: Secondary | ICD-10-CM

## 2015-01-18 DIAGNOSIS — D509 Iron deficiency anemia, unspecified: Secondary | ICD-10-CM

## 2015-01-18 DIAGNOSIS — D709 Neutropenia, unspecified: Secondary | ICD-10-CM

## 2015-01-18 DIAGNOSIS — R300 Dysuria: Secondary | ICD-10-CM | POA: Diagnosis not present

## 2015-01-18 DIAGNOSIS — L97909 Non-pressure chronic ulcer of unspecified part of unspecified lower leg with unspecified severity: Secondary | ICD-10-CM

## 2015-01-18 DIAGNOSIS — G5690 Unspecified mononeuropathy of unspecified upper limb: Secondary | ICD-10-CM | POA: Diagnosis not present

## 2015-01-18 DIAGNOSIS — I83009 Varicose veins of unspecified lower extremity with ulcer of unspecified site: Secondary | ICD-10-CM

## 2015-01-18 DIAGNOSIS — E785 Hyperlipidemia, unspecified: Secondary | ICD-10-CM | POA: Diagnosis not present

## 2015-01-18 DIAGNOSIS — F32A Depression, unspecified: Secondary | ICD-10-CM

## 2015-01-18 DIAGNOSIS — D510 Vitamin B12 deficiency anemia due to intrinsic factor deficiency: Secondary | ICD-10-CM | POA: Diagnosis not present

## 2015-01-18 DIAGNOSIS — Z23 Encounter for immunization: Secondary | ICD-10-CM

## 2015-01-18 LAB — POCT URINALYSIS DIPSTICK
BILIRUBIN UA: NEGATIVE
Blood, UA: NEGATIVE
GLUCOSE UA: NEGATIVE
Ketones, UA: NEGATIVE
NITRITE UA: NEGATIVE
Protein, UA: NEGATIVE
Spec Grav, UA: >=1.03
Urobilinogen, UA: >=8
pH, UA: 5

## 2015-01-18 LAB — POCT UA - MICROSCOPIC ONLY
CASTS, UR, LPF, POC: NEGATIVE
CRYSTALS, UR, HPF, POC: NEGATIVE
MUCUS UA: NEGATIVE
Yeast, UA: NEGATIVE

## 2015-01-18 MED ORDER — XARELTO 10 MG PO TABS: 10.0000 mg | ORAL_TABLET | Freq: Every day | ORAL | Status: DC

## 2015-01-18 MED ORDER — GABAPENTIN 300 MG PO CAPS: ORAL_CAPSULE | ORAL | Status: DC

## 2015-01-18 MED ORDER — FUROSEMIDE 40 MG PO TABS: ORAL_TABLET | ORAL | Status: DC

## 2015-01-18 MED ORDER — LEVETIRACETAM 500 MG PO TABS: ORAL_TABLET | ORAL | Status: DC

## 2015-01-18 MED ORDER — INTEGRA 62.5-62.5-40-3 MG PO CAPS: ORAL_CAPSULE | ORAL | Status: DC

## 2015-01-18 MED ORDER — ATORVASTATIN CALCIUM 40 MG PO TABS: 40.0000 mg | ORAL_TABLET | Freq: Every day | ORAL | Status: DC

## 2015-01-18 MED ORDER — FENTANYL 75 MCG/HR TD PT72: 75.0000 ug | MEDICATED_PATCH | TRANSDERMAL | Status: DC

## 2015-01-18 MED ORDER — CIPROFLOXACIN HCL 500 MG PO TABS: 500.0000 mg | ORAL_TABLET | Freq: Two times a day (BID) | ORAL | Status: DC

## 2015-01-18 MED ORDER — FLUOXETINE HCL 40 MG PO CAPS: 80.0000 mg | ORAL_CAPSULE | Freq: Every day | ORAL | Status: DC

## 2015-01-18 NOTE — Patient Instructions (Signed)
Health Maintenance, Female Adopting a healthy lifestyle and getting preventive care can go a long way to promote health and wellness. Talk with your health care provider about what schedule of regular examinations is right for you. This is a good chance for you to check in with your provider about disease prevention and staying healthy. In between checkups, there are plenty of things you can do on your own. Experts have done a lot of research about which lifestyle changes and preventive measures are most likely to keep you healthy. Ask your health care provider for more information. WEIGHT AND DIET  Eat a healthy diet  Be sure to include plenty of vegetables, fruits, low-fat dairy products, and lean protein.  Do not eat a lot of foods high in solid fats, added sugars, or salt.  Get regular exercise. This is one of the most important things you can do for your health.  Most adults should exercise for at least 150 minutes each week. The exercise should increase your heart rate and make you sweat (moderate-intensity exercise).  Most adults should also do strengthening exercises at least twice a week. This is in addition to the moderate-intensity exercise.  Maintain a healthy weight  Body mass index (BMI) is a measurement that can be used to identify possible weight problems. It estimates body fat based on height and weight. Your health care provider can help determine your BMI and help you achieve or maintain a healthy weight.  For females 20 years of age and older:   A BMI below 18.5 is considered underweight.  A BMI of 18.5 to 24.9 is normal.  A BMI of 25 to 29.9 is considered overweight.  A BMI of 30 and above is considered obese.  Watch levels of cholesterol and blood lipids  You should start having your blood tested for lipids and cholesterol at 52 years of age, then have this test every 5 years.  You may need to have your cholesterol levels checked more often if:  Your lipid  or cholesterol levels are high.  You are older than 52 years of age.  You are at high risk for heart disease.  CANCER SCREENING   Lung Cancer  Lung cancer screening is recommended for adults 55-80 years old who are at high risk for lung cancer because of a history of smoking.  A yearly low-dose CT scan of the lungs is recommended for people who:  Currently smoke.  Have quit within the past 15 years.  Have at least a 30-pack-year history of smoking. A pack year is smoking an average of one pack of cigarettes a day for 1 year.  Yearly screening should continue until it has been 15 years since you quit.  Yearly screening should stop if you develop a health problem that would prevent you from having lung cancer treatment.  Breast Cancer  Practice breast self-awareness. This means understanding how your breasts normally appear and feel.  It also means doing regular breast self-exams. Let your health care provider know about any changes, no matter how small.  If you are in your 20s or 30s, you should have a clinical breast exam (CBE) by a health care provider every 1-3 years as part of a regular health exam.  If you are 40 or older, have a CBE every year. Also consider having a breast X-ray (mammogram) every year.  If you have a family history of breast cancer, talk to your health care provider about genetic screening.  If you   are at high risk for breast cancer, talk to your health care provider about having an MRI and a mammogram every year.  Breast cancer gene (BRCA) assessment is recommended for women who have family members with BRCA-related cancers. BRCA-related cancers include:  Breast.  Ovarian.  Tubal.  Peritoneal cancers.  Results of the assessment will determine the need for genetic counseling and BRCA1 and BRCA2 testing. Cervical Cancer Your health care provider may recommend that you be screened regularly for cancer of the pelvic organs (ovaries, uterus, and  vagina). This screening involves a pelvic examination, including checking for microscopic changes to the surface of your cervix (Pap test). You may be encouraged to have this screening done every 3 years, beginning at age 21.  For women ages 30-65, health care providers may recommend pelvic exams and Pap testing every 3 years, or they may recommend the Pap and pelvic exam, combined with testing for human papilloma virus (HPV), every 5 years. Some types of HPV increase your risk of cervical cancer. Testing for HPV may also be done on women of any age with unclear Pap test results.  Other health care providers may not recommend any screening for nonpregnant women who are considered low risk for pelvic cancer and who do not have symptoms. Ask your health care provider if a screening pelvic exam is right for you.  If you have had past treatment for cervical cancer or a condition that could lead to cancer, you need Pap tests and screening for cancer for at least 20 years after your treatment. If Pap tests have been discontinued, your risk factors (such as having a new sexual partner) need to be reassessed to determine if screening should resume. Some women have medical problems that increase the chance of getting cervical cancer. In these cases, your health care provider may recommend more frequent screening and Pap tests. Colorectal Cancer  This type of cancer can be detected and often prevented.  Routine colorectal cancer screening usually begins at 52 years of age and continues through 52 years of age.  Your health care provider may recommend screening at an earlier age if you have risk factors for colon cancer.  Your health care provider may also recommend using home test kits to check for hidden blood in the stool.  A small camera at the end of a tube can be used to examine your colon directly (sigmoidoscopy or colonoscopy). This is done to check for the earliest forms of colorectal  cancer.  Routine screening usually begins at age 50.  Direct examination of the colon should be repeated every 5-10 years through 52 years of age. However, you may need to be screened more often if early forms of precancerous polyps or small growths are found. Skin Cancer  Check your skin from head to toe regularly.  Tell your health care provider about any new moles or changes in moles, especially if there is a change in a mole's shape or color.  Also tell your health care provider if you have a mole that is larger than the size of a pencil eraser.  Always use sunscreen. Apply sunscreen liberally and repeatedly throughout the day.  Protect yourself by wearing long sleeves, pants, a wide-brimmed hat, and sunglasses whenever you are outside. HEART DISEASE, DIABETES, AND HIGH BLOOD PRESSURE   High blood pressure causes heart disease and increases the risk of stroke. High blood pressure is more likely to develop in:  People who have blood pressure in the high end   of the normal range (130-139/85-89 mm Hg).  People who are overweight or obese.  People who are African American.  If you are 38-23 years of age, have your blood pressure checked every 3-5 years. If you are 61 years of age or older, have your blood pressure checked every year. You should have your blood pressure measured twice--once when you are at a hospital or clinic, and once when you are not at a hospital or clinic. Record the average of the two measurements. To check your blood pressure when you are not at a hospital or clinic, you can use:  An automated blood pressure machine at a pharmacy.  A home blood pressure monitor.  If you are between 45 years and 39 years old, ask your health care provider if you should take aspirin to prevent strokes.  Have regular diabetes screenings. This involves taking a blood sample to check your fasting blood sugar level.  If you are at a normal weight and have a low risk for diabetes,  have this test once every three years after 52 years of age.  If you are overweight and have a high risk for diabetes, consider being tested at a younger age or more often. PREVENTING INFECTION  Hepatitis B  If you have a higher risk for hepatitis B, you should be screened for this virus. You are considered at high risk for hepatitis B if:  You were born in a country where hepatitis B is common. Ask your health care provider which countries are considered high risk.  Your parents were born in a high-risk country, and you have not been immunized against hepatitis B (hepatitis B vaccine).  You have HIV or AIDS.  You use needles to inject street drugs.  You live with someone who has hepatitis B.  You have had sex with someone who has hepatitis B.  You get hemodialysis treatment.  You take certain medicines for conditions, including cancer, organ transplantation, and autoimmune conditions. Hepatitis C  Blood testing is recommended for:  Everyone born from [birth year] through 1963.  Anyone with known risk factors for hepatitis C. Sexually transmitted infections (STIs)  You should be screened for sexually transmitted infections (STIs) including gonorrhea and chlamydia if:  You are sexually active and are younger than 52 years of age.  You are older than 53 years of age and your health care provider tells you that you are at risk for this type of infection.  Your sexual activity has changed since you were last screened and you are at an increased risk for chlamydia or gonorrhea. Ask your health care provider if you are at risk.  If you do not have HIV, but are at risk, it may be recommended that you take a prescription medicine daily to prevent HIV infection. This is called pre-exposure prophylaxis (PrEP). You are considered at risk if:  You are sexually active and do not regularly use condoms or know the HIV status of your partner(s).  You take drugs by injection.  You are sexually  active with a partner who has HIV. Talk with your health care provider about whether you are at high risk of being infected with HIV. If you choose to begin PrEP, you should first be tested for HIV. You should then be tested every 3 months for as long as you are taking PrEP.  PREGNANCY   If you are premenopausal and you may become pregnant, ask your health care provider about preconception counseling.  If you may  become pregnant, take 400 to 800 micrograms (mcg) of folic acid every day.  If you want to prevent pregnancy, talk to your health care provider about birth control (contraception). OSTEOPOROSIS AND MENOPAUSE   Osteoporosis is a disease in which the bones lose minerals and strength with aging. This can result in serious bone fractures. Your risk for osteoporosis can be identified using a bone density scan.  If you are 61 years of age or older, or if you are at risk for osteoporosis and fractures, ask your health care provider if you should be screened.  Ask your health care provider whether you should take a calcium or vitamin D supplement to lower your risk for osteoporosis.  Menopause may have certain physical symptoms and risks.  Hormone replacement therapy may reduce some of these symptoms and risks. Talk to your health care provider about whether hormone replacement therapy is right for you.  HOME CARE INSTRUCTIONS   Schedule regular health, dental, and eye exams.  Stay current with your immunizations.   Do not use any tobacco products including cigarettes, chewing tobacco, or electronic cigarettes.  If you are pregnant, do not drink alcohol.  If you are breastfeeding, limit how much and how often you drink alcohol.  Limit alcohol intake to no more than 1 drink per day for nonpregnant women. One drink equals 12 ounces of beer, 5 ounces of wine, or 1 ounces of hard liquor.  Do not use street drugs.  Do not share needles.  Ask your health care provider for help if  you need support or information about quitting drugs.  Tell your health care provider if you often feel depressed.  Tell your health care provider if you have ever been abused or do not feel safe at home.   This information is not intended to replace advice given to you by your health care provider. Make sure you discuss any questions you have with your health care provider.   Document Released: 10/14/2010 Document Revised: 04/21/2014 Document Reviewed: 03/02/2013 Elsevier Interactive Patient Education [employer].

## 2015-01-18 NOTE — Progress Notes (Addendum)
Subjective:    Patient ID: Angela Michael, female    DOB: 30-Apr-1960, 54 y.o.   MRN: [id number]  [facility] and has seen lots of specialist for her multiple problems. Her only complaint today is constant left knee pain- is going to see ortho in December. She is still on fentanyl patches and it does not help her knee. Her other medical problems include: Patient Active Problem List   Diagnosis Date Noted  . Presence of vena cava filter   . Pancytopenia ([place])   . Neutropenia ([place])   . Thrombocytopenia ([place]) 08/01/2014  . Neutropenia with fever ([place]) 07/31/2014  . Hypoxia 07/31/2014  . SIRS (systemic inflammatory response syndrome) ([place]) 07/31/2014  . Lymphoma ([place])   . Seizures ([place]) 12/05/2013  . Heme positive stool 06/22/2013  . Melena 06/22/2013  . Varicose veins of lower extremities with other complications 16/57/9038  . Chest pain 11/12/2012  . Morbid obesity ([place]) 11/12/2012  . SVT (supraventricular tachycardia) ([place])   . Peripheral edema 08/06/2012  . Hyperlipidemia with target LDL less than 100 08/06/2012  . Neuropathy of upper extremity 08/06/2012  . Anemia 08/06/2012  . Hypokalemia 08/06/2012  . STRESS ELECTROCARDIOGRAM, ABNORMAL 01/31/2009  . Depression 11/04/2008  . SYNCOPE, HX OF 11/04/2008   Outpatient Encounter Prescriptions as of 01/18/2015  Medication Sig  . Ascorbic Acid (VITAMIN C) 1000 MG tablet Take 1,000 mg by mouth daily.   Marland Kitchen aspirin 81 MG tablet Take 81 mg by mouth daily.  Marland Kitchen atorvastatin (LIPITOR) 40 MG tablet Take 1 tablet (40 mg total) by mouth at bedtime.  . cyanocobalamin (,VITAMIN B-12,) 1000 MCG/ML injection INJECT 1 ML (1,000 MCG TOTAL) INTO THE MUSCLE EVERY 30 (THIRTY) DAYS.  . Fe Fum-FePoly-Vit C-Vit B3 (INTEGRA) 62.5-62.5-40-3 MG CAPS TAKE ONE CAPSULE BY MOUTH EVERY DAY  . fentaNYL (DURAGESIC - DOSED MCG/HR) 75 MCG/HR Place 1 patch (75 mcg total) onto the skin every 3 (three) days.    Marland Kitchen FLUoxetine (PROZAC) 40 MG capsule Take 2 capsules (80 mg total) by mouth daily.  . fluticasone (FLONASE) 50 MCG/ACT nasal spray PLACE 2 SPRAYS INTO THE NOSE AS NEEDED. FOR ALLERGIES  . furosemide (LASIX) 40 MG tablet TAKE 1 TABLET (40 MG TOTAL) BY MOUTH DAILY.  Marland Kitchen gabapentin (NEURONTIN) 300 MG capsule TAKE 2 CAPSULE BY MOUTH 2 TIMES DAILY  . levETIRAcetam (KEPPRA) 500 MG tablet TAKE 2 TABLETS BY MOUTH 2 TIMES DAILY  . loratadine (CLARITIN) 10 MG tablet Take 10 mg by mouth as needed. For allergies  . Multiple Vitamin (MULTIVITAMIN WITH MINERALS) TABS Take 1 tablet by mouth daily.  . nitroGLYCERIN (NITROSTAT) 0.4 MG SL tablet Place 1 tablet (0.4 mg total) under the tongue every 5 (five) minutes as needed for chest pain.  . Nutritional Supplements (ESTROVEN PMS) TABS Take 1 tablet by mouth daily.   Alveda Reasons 10 MG TABS tablet Take 1 tablet (10 mg total) by mouth daily.  . [DISCONTINUED] furosemide (LASIX) 40 MG tablet TAKE 1 TABLET (40 MG TOTAL) BY MOUTH DAILY.  . [DISCONTINUED] nitrofurantoin, macrocrystal-monohydrate, (MACROBID) 100 MG capsule Take 1 capsule (100 mg total) by mouth 2 (two) times daily. 1 po BId   No facility-administered encounter medications on file as of 01/18/2015.        Review of Systems  Constitutional: Negative.   HENT: Negative.   Respiratory: Negative.   Cardiovascular: Negative.   Gastrointestinal: Negative.   Musculoskeletal: Positive for arthralgias (left  knee).  Neurological: Negative.   Psychiatric/Behavioral: Negative.   All other systems reviewed and are negative.      Objective:   Physical Exam  Constitutional: She is oriented to person, place, and time. She appears well-developed and well-nourished.  HENT:  Nose: Nose normal.  Mouth/Throat: Oropharynx is clear and moist.  Eyes: EOM are normal.  Neck: Trachea normal, normal range of motion and full passive range of motion without pain. Neck supple. No JVD present. Carotid bruit is not  present. No thyromegaly present.  Cardiovascular: Normal rate, regular rhythm, normal heart sounds and intact distal pulses.  Exam reveals no gallop and no friction rub.   No murmur heard. Pulmonary/Chest: Effort normal and breath sounds normal.  Abdominal: Soft. Bowel sounds are normal. She exhibits no distension and no mass. There is no tenderness.  Musculoskeletal: Normal range of motion.  Left knee assessment deferred to ortho  Lymphadenopathy:    She has no cervical adenopathy.  Neurological: She is alert and oriented to person, place, and time. She has normal reflexes.  Skin: Skin is warm and dry.  Psychiatric: She has a normal mood and affect. Her behavior is normal. Judgment and thought content normal.    BP 110/68 mmHg  Pulse 47  Temp(Src) 97.3 F (36.3 C) (Oral)  Ht 5' 1"  (1.549 m)  Wt 203 lb (92.08 kg)  BMI 38.38 kg/m2  Results for orders placed or performed in visit on 01/18/15  POCT UA - Microscopic Only  Result Value Ref Range   WBC, Ur, HPF, POC 40-80    RBC, urine, microscopic occ    Bacteria, U Microscopic many    Mucus, UA neg    Epithelial cells, urine per micros few    Crystals, Ur, HPF, POC neg    Casts, Ur, LPF, POC neg    Yeast, UA neg   POCT urinalysis dipstick  Result Value Ref Range   Color, UA amber    Clarity, UA cloudy    Glucose, UA neg    Bilirubin, UA neg    Ketones, UA neg    Spec Grav, UA >=1.030    Blood, UA neg    pH, UA 5.0    Protein, UA neg    Urobilinogen, UA >=8.0    Nitrite, UA neg    Leukocytes, UA large (3+) (A) Negative        Assessment & Plan:   1. Dysuria   2. SVT (supraventricular tachycardia) ([place])   3. Neuropathy of upper extremity, right   4. Depression   5. Peripheral edema   6. Hyperlipidemia with target LDL less than 100   7. Vitamin B12 deficiency anemia due to intrinsic factor deficiency   8. Morbid obesity due to excess calories ([place])   9. Thrombocytopenia ([place])   10. Neutropenia, unspecified type  ([place])   11. Varicose veins of lower extremities with ulcer, unspecified laterality ([place])   12. Neuropathy of upper extremity, unspecified laterality   13. Iron deficiency anemia    Meds ordered this encounter  Medications  . XARELTO 10 MG TABS tablet    Sig: Take 1 tablet (10 mg total) by mouth daily.    Dispense:  30 tablet    Refill:  5    Order Specific Question:  Supervising Provider    Answer:  Chipper Herb [[id number]]  . levETIRAcetam (KEPPRA) 500 MG tablet    Sig: TAKE 2 TABLETS BY MOUTH 2 TIMES DAILY    Dispense:  120 tablet  Refill:  5    Order Specific Question:  Supervising Provider    Answer:  Chipper Herb [[id number]]  . gabapentin (NEURONTIN) 300 MG capsule    Sig: TAKE 2 CAPSULE BY MOUTH 2 TIMES DAILY    Dispense:  120 capsule    Refill:  5    Order Specific Question:  Supervising Provider    Answer:  Chipper Herb [[id number]]  . furosemide (LASIX) 40 MG tablet    Sig: TAKE 1 TABLET (40 MG TOTAL) BY MOUTH DAILY.    Dispense:  30 tablet    Refill:  5    Order Specific Question:  Supervising Provider    Answer:  Chipper Herb [[id number]]  . FLUoxetine (PROZAC) 40 MG capsule    Sig: Take 2 capsules (80 mg total) by mouth daily.    Dispense:  60 capsule    Refill:  5    Order Specific Question:  Supervising Provider    Answer:  Chipper Herb [[id number]]  . fentaNYL (DURAGESIC - DOSED MCG/HR) 75 MCG/HR    Sig: Place 1 patch (75 mcg total) onto the skin every 3 (three) days.    Dispense:  10 patch    Refill:  0    Do not fill till 01/31/15    Order Specific Question:  Supervising Provider    Answer:  Chipper Herb [[id number]]  . Fe Fum-FePoly-Vit C-Vit B3 (INTEGRA) 62.5-62.5-40-3 MG CAPS    Sig: TAKE ONE CAPSULE BY MOUTH EVERY DAY    Dispense:  30 capsule    Refill:  5    Order Specific Question:  Supervising Provider    Answer:  Chipper Herb [[id number]]  . atorvastatin (LIPITOR) 40 MG tablet    Sig: Take 1 tablet (40 mg total) by mouth at bedtime.    Dispense:  30 tablet     Refill:  5    Order Specific Question:  Supervising Provider    Answer:  Chipper Herb [[id number]]   Orders Placed This Encounter  Procedures  . Urine culture  . CMP14+EGFR  . Lipid panel  . POCT UA - Microscopic Only  . POCT urinalysis dipstick   Health maintenance reviewed Labs pending Continue all meds RTO in 3 months prn  Mary-Margaret Hassell Done, FNP

## 2015-01-18 NOTE — Telephone Encounter (Signed)
Referral made to pain clinic. 

## 2015-01-18 NOTE — Addendum Note (Signed)
Addended by: Chevis Pretty on: 01/18/2015 11:07 AM   Modules accepted: Orders

## 2015-01-18 NOTE — Telephone Encounter (Signed)
Left detailed message stating that referral has been made and it can take 7 business days to get referral to go through but to CB with any further questions or concerns.

## 2015-01-19 DIAGNOSIS — Z23 Encounter for immunization: Secondary | ICD-10-CM

## 2015-01-19 LAB — CMP14+EGFR
A/G RATIO: 1.9 (ref 1.1–2.5)
ALK PHOS: 81 IU/L (ref 39–117)
ALT: 18 IU/L (ref 0–32)
AST: 16 IU/L (ref 0–40)
Albumin: 3.8 g/dL (ref 3.5–5.5)
BUN/Creatinine Ratio: 18 (ref 9–23)
BUN: 15 mg/dL (ref 6–24)
Bilirubin Total: 0.8 mg/dL (ref 0.0–1.2)
CALCIUM: 9.1 mg/dL (ref 8.7–10.2)
CO2: 27 mmol/L (ref 18–29)
CREATININE: 0.83 mg/dL (ref 0.57–1.00)
Chloride: 104 mmol/L (ref 97–108)
GFR calc Af Amer: 94 mL/min/{1.73_m2} (ref >59)
GFR, EST NON AFRICAN AMERICAN: 81 mL/min/{1.73_m2} (ref >59)
GLOBULIN, TOTAL: 2 g/dL (ref 1.5–4.5)
Glucose: 83 mg/dL (ref 65–99)
POTASSIUM: 4.1 mmol/L (ref 3.5–5.2)
SODIUM: 144 mmol/L (ref 134–144)
Total Protein: 5.8 g/dL — ABNORMAL LOW (ref 6.0–8.5)

## 2015-01-19 LAB — LIPID PANEL
CHOL/HDL RATIO: 2.8 ratio (ref 0.0–4.4)
CHOLESTEROL TOTAL: 121 mg/dL (ref 100–199)
HDL: 44 mg/dL (ref >39)
LDL Calculated: 57 mg/dL (ref 0–99)
TRIGLYCERIDES: 100 mg/dL (ref 0–149)
VLDL Cholesterol Cal: 20 mg/dL (ref 5–40)

## 2015-01-19 NOTE — Telephone Encounter (Signed)
Returned patient's call and explained the message that was left on her voicemail yesterday. She understands that referral to pain clinic has been placed and that the review process can sometimes be lengthy. She will contact our referral department with any further questions about this.

## 2015-01-20 LAB — URINE CULTURE

## 2015-02-13 IMAGING — CR DG KNEE 1-2V*L*
2 series · 2 of 2 positions shown · non-contrast
Comparison: right knee same day

CLINICAL DATA: Left knee pain, no known injury

EXAM:
LEFT KNEE - 1-2 VIEW

[view not recorded (1 of 2)]
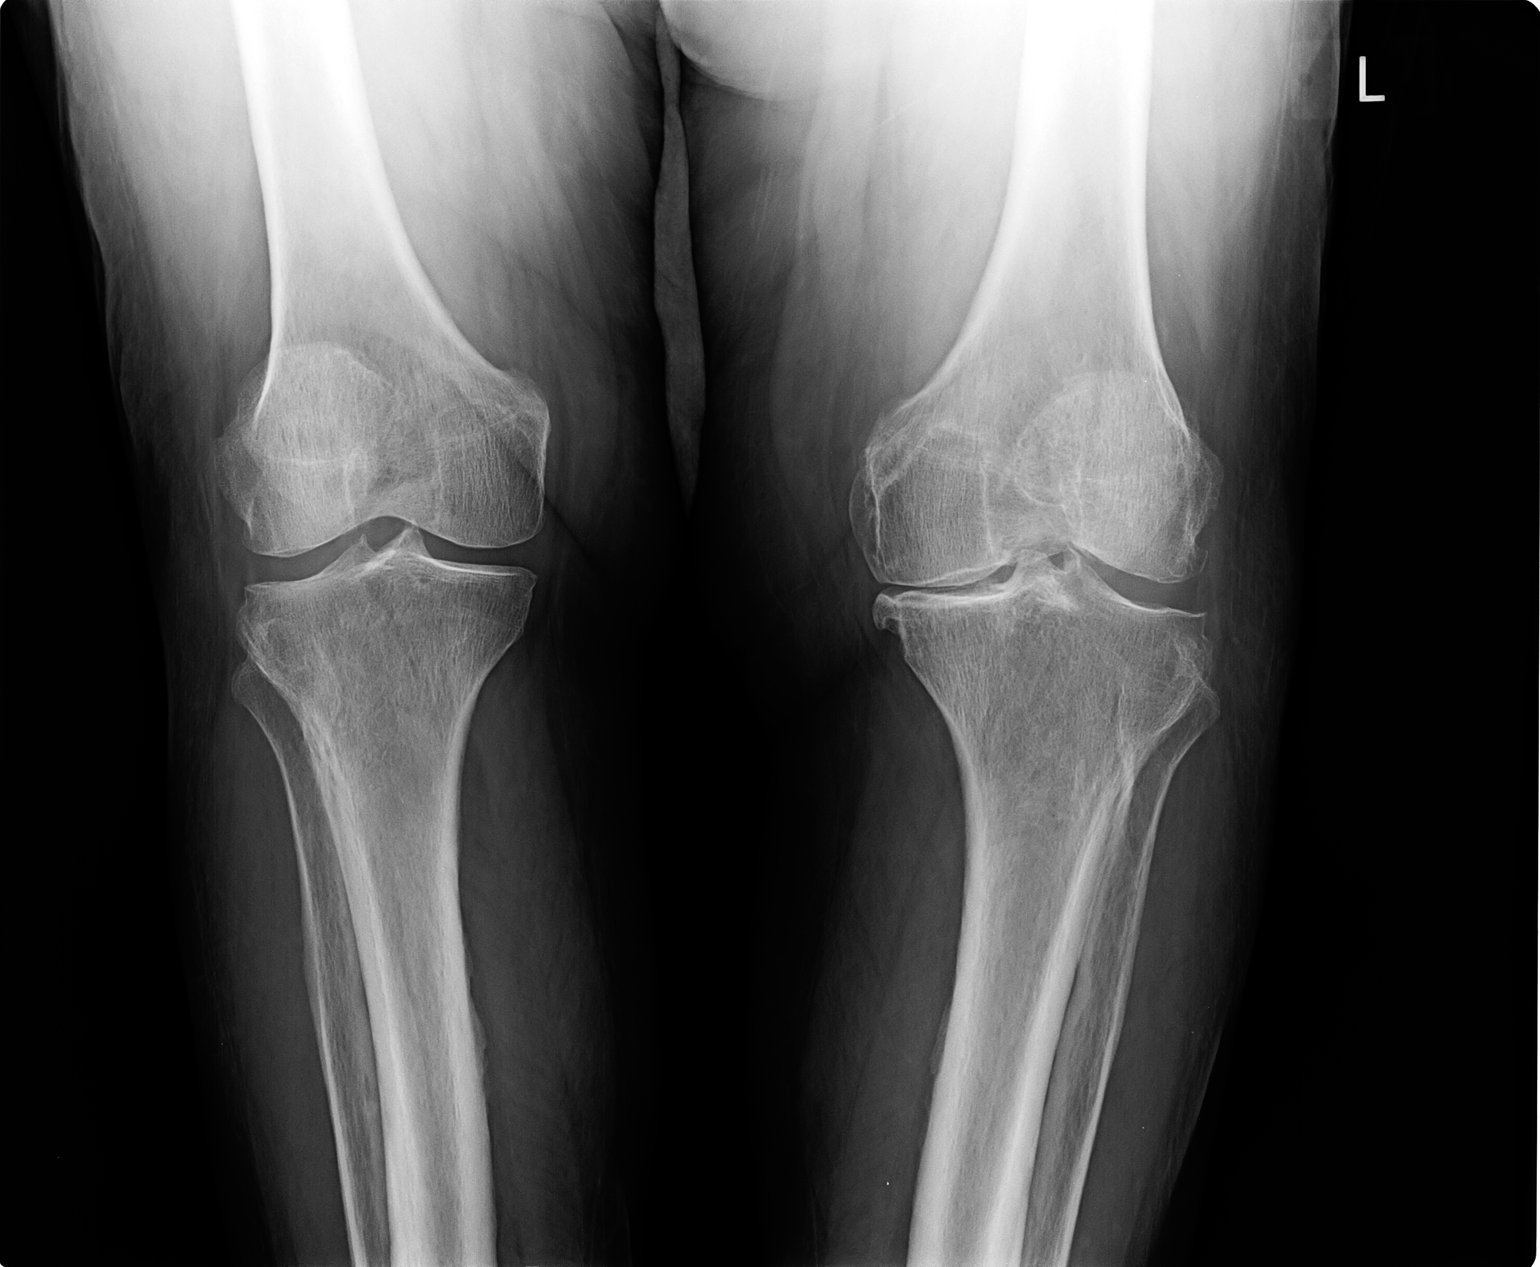

[view not recorded (2 of 2)]
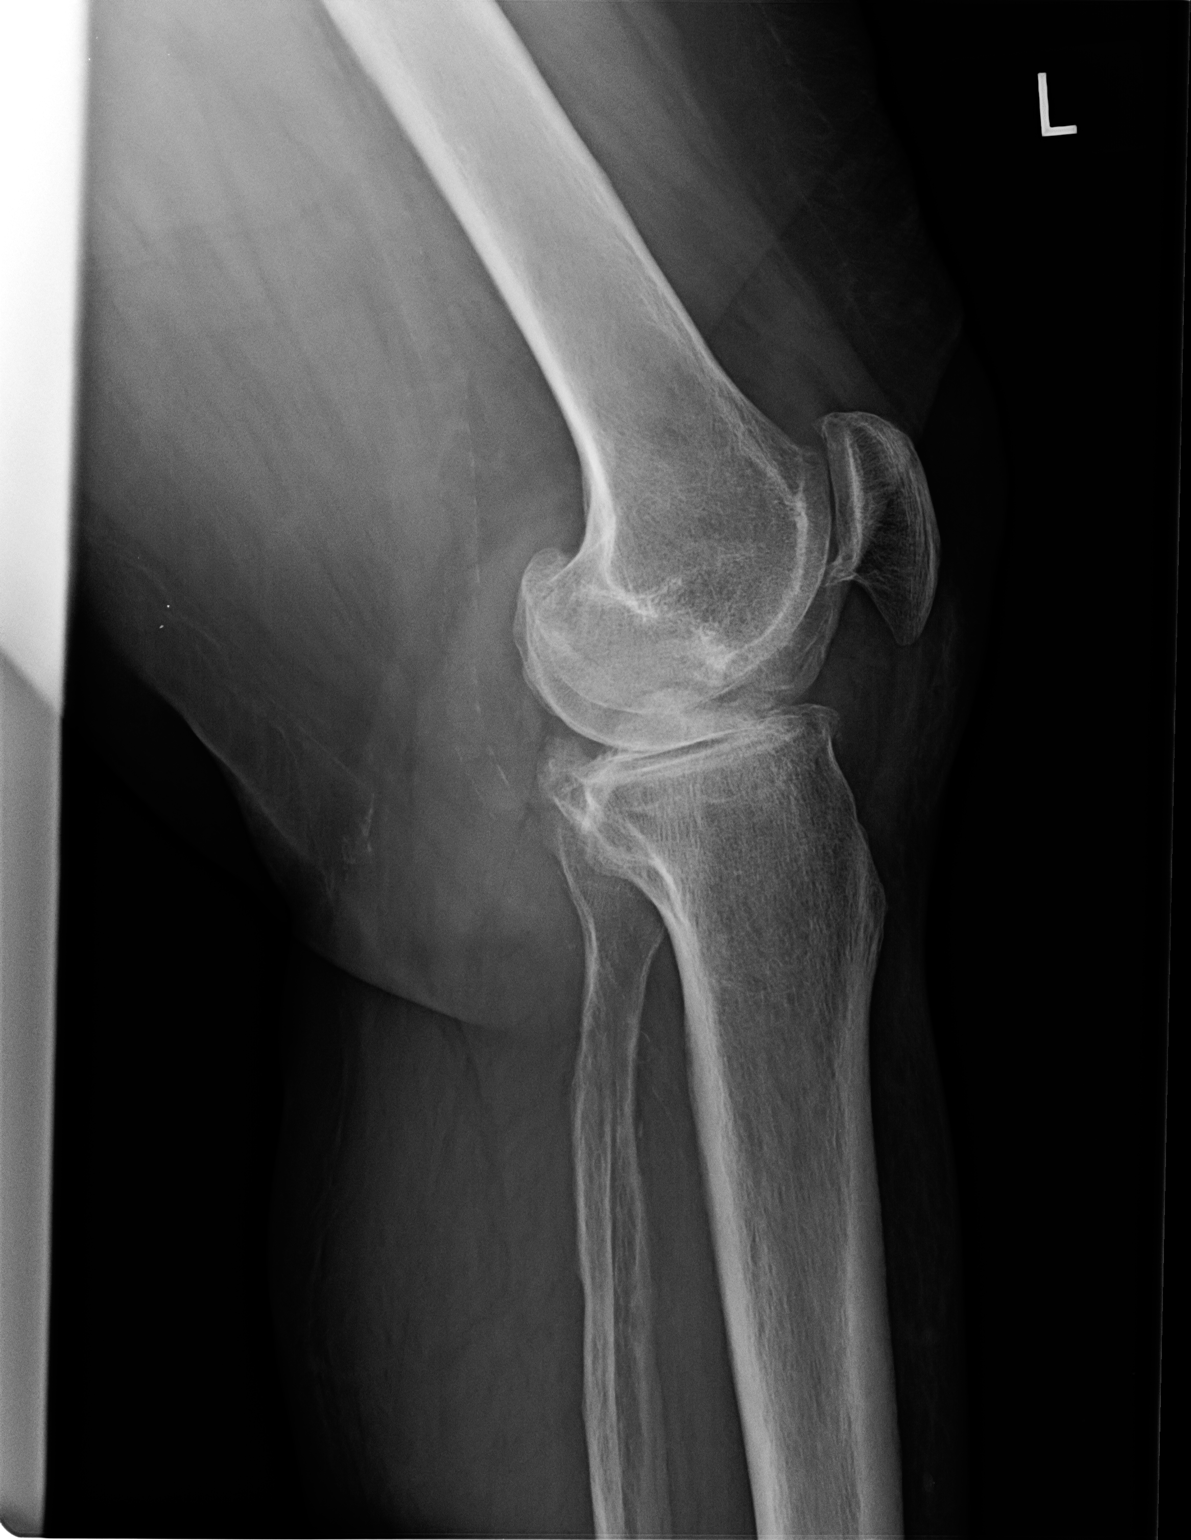

[2 of 2 positions shown; findings below may reference images not displayed]

FINDINGS: Two views of the left knee submitted. There is narrowing of medial
joint compartment. Mild spurring of medial tibial plateau. Narrowing
of patellofemoral joint space. Small joint effusion. No acute
fracture or subluxation.
IMPRESSION: No acute fracture or subluxation. Osteoarthritic changes as
described above.

## 2015-02-16 ENCOUNTER — Other Ambulatory Visit: Payer: Medicaid Other

## 2015-02-16 DIAGNOSIS — R300 Dysuria: Secondary | ICD-10-CM

## 2015-02-16 DIAGNOSIS — R8290 Unspecified abnormal findings in urine: Secondary | ICD-10-CM

## 2015-02-16 NOTE — Progress Notes (Signed)
Lab work for Dr Cloria Spring Urine C&S R30.0, R82.90 Fax [phone]

## 2015-02-18 ENCOUNTER — Observation Stay (HOSPITAL_COMMUNITY)
Admission: EM | Admit: 2015-02-18 | Discharge: 2015-02-19 | Disposition: A | Discharge status: Home / Self Care | Payer: Medicaid Other | Attending: Internal Medicine | Admitting: Internal Medicine

## 2015-02-18 ENCOUNTER — Emergency Department (HOSPITAL_COMMUNITY): Payer: Medicaid Other

## 2015-02-18 ENCOUNTER — Encounter (HOSPITAL_COMMUNITY): Payer: Self-pay

## 2015-02-18 DIAGNOSIS — E876 Hypokalemia: Secondary | ICD-10-CM | POA: Diagnosis not present

## 2015-02-18 DIAGNOSIS — E785 Hyperlipidemia, unspecified: Secondary | ICD-10-CM | POA: Diagnosis not present

## 2015-02-18 DIAGNOSIS — I471 Supraventricular tachycardia: Secondary | ICD-10-CM | POA: Diagnosis not present

## 2015-02-18 DIAGNOSIS — Z8572 Personal history of non-Hodgkin lymphomas: Secondary | ICD-10-CM | POA: Insufficient documentation

## 2015-02-18 DIAGNOSIS — R079 Chest pain, unspecified: Secondary | ICD-10-CM | POA: Diagnosis not present

## 2015-02-18 DIAGNOSIS — I493 Ventricular premature depolarization: Secondary | ICD-10-CM | POA: Diagnosis not present

## 2015-02-18 DIAGNOSIS — C8590 Non-Hodgkin lymphoma, unspecified, unspecified site: Secondary | ICD-10-CM | POA: Diagnosis present

## 2015-02-18 DIAGNOSIS — R51 Headache: Secondary | ICD-10-CM | POA: Diagnosis not present

## 2015-02-18 DIAGNOSIS — G8929 Other chronic pain: Secondary | ICD-10-CM | POA: Insufficient documentation

## 2015-02-18 DIAGNOSIS — Z7982 Long term (current) use of aspirin: Secondary | ICD-10-CM | POA: Insufficient documentation

## 2015-02-18 DIAGNOSIS — Z79899 Other long term (current) drug therapy: Secondary | ICD-10-CM | POA: Diagnosis not present

## 2015-02-18 DIAGNOSIS — R42 Dizziness and giddiness: Secondary | ICD-10-CM | POA: Diagnosis not present

## 2015-02-18 DIAGNOSIS — E119 Type 2 diabetes mellitus without complications: Secondary | ICD-10-CM | POA: Diagnosis not present

## 2015-02-18 DIAGNOSIS — G629 Polyneuropathy, unspecified: Secondary | ICD-10-CM | POA: Diagnosis not present

## 2015-02-18 DIAGNOSIS — F329 Major depressive disorder, single episode, unspecified: Secondary | ICD-10-CM | POA: Insufficient documentation

## 2015-02-18 LAB — COMPREHENSIVE METABOLIC PANEL
ALBUMIN: 3.3 g/dL — AB (ref 3.5–5.0)
ALK PHOS: 96 U/L (ref 38–126)
ALT: 21 U/L (ref 14–54)
AST: 25 U/L (ref 15–41)
Anion gap: 8 (ref 5–15)
BILIRUBIN TOTAL: 0.8 mg/dL (ref 0.3–1.2)
BUN: 19 mg/dL (ref 6–20)
CALCIUM: 8.5 mg/dL — AB (ref 8.9–10.3)
CO2: 25 mmol/L (ref 22–32)
CREATININE: 0.73 mg/dL (ref 0.44–1.00)
Chloride: 110 mmol/L (ref 101–111)
GFR calc Af Amer: >60 mL/min (ref >60)
GLUCOSE: 103 mg/dL — AB (ref 65–99)
Potassium: 3.2 mmol/L — ABNORMAL LOW (ref 3.5–5.1)
Sodium: 143 mmol/L (ref 135–145)
TOTAL PROTEIN: 5.7 g/dL — AB (ref 6.5–8.1)

## 2015-02-18 LAB — D-DIMER, QUANTITATIVE: D-Dimer, Quant: 0.31 ug/mL-FEU (ref 0.00–0.48)

## 2015-02-18 LAB — CBC
HEMATOCRIT: 33.6 % — AB (ref 36.0–46.0)
HEMOGLOBIN: 11.2 g/dL — AB (ref 12.0–15.0)
MCH: 31.9 pg (ref 26.0–34.0)
MCHC: 33.3 g/dL (ref 30.0–36.0)
MCV: 95.7 fL (ref 78.0–100.0)
Platelets: 118 10*3/uL — ABNORMAL LOW (ref 150–400)
RBC: 3.51 MIL/uL — ABNORMAL LOW (ref 3.87–5.11)
RDW: 11.7 % (ref 11.5–15.5)
WBC: 4.3 10*3/uL (ref 4.0–10.5)

## 2015-02-18 LAB — URINE CULTURE

## 2015-02-18 LAB — TROPONIN I: Troponin I: <0.03 ng/mL (ref <0.031)

## 2015-02-18 MED ORDER — FUROSEMIDE 40 MG PO TABS
40.0000 mg | ORAL_TABLET | Freq: Every day | ORAL | Status: DC
  Administered 2015-02-18: 40 mg via ORAL
  Filled 2015-02-18: qty 1

## 2015-02-18 MED ORDER — MORPHINE SULFATE (PF) 2 MG/ML IV SOLN: 1.0000 mg | INTRAVENOUS | Status: DC | PRN

## 2015-02-18 MED ORDER — NITROGLYCERIN 0.4 MG SL SUBL: 0.4000 mg | SUBLINGUAL_TABLET | SUBLINGUAL | Status: DC | PRN

## 2015-02-18 MED ORDER — TRAZODONE HCL 50 MG PO TABS
150.0000 mg | ORAL_TABLET | Freq: Every day | ORAL | Status: DC
  Administered 2015-02-18: 150 mg via ORAL
  Filled 2015-02-18: qty 3

## 2015-02-18 MED ORDER — ACETAMINOPHEN 325 MG PO TABS: 650.0000 mg | ORAL_TABLET | ORAL | Status: DC | PRN

## 2015-02-18 MED ORDER — ADULT MULTIVITAMIN W/MINERALS CH
1.0000 | ORAL_TABLET | Freq: Every day | ORAL | Status: DC
  Administered 2015-02-18: 1 via ORAL
  Filled 2015-02-18 (×2): qty 1

## 2015-02-18 MED ORDER — ASPIRIN EC 81 MG PO TBEC
81.0000 mg | DELAYED_RELEASE_TABLET | Freq: Every day | ORAL | Status: DC
  Administered 2015-02-19: 81 mg via ORAL
  Filled 2015-02-18: qty 1

## 2015-02-18 MED ORDER — ONDANSETRON HCL 4 MG/2ML IJ SOLN: 4.0000 mg | Freq: Four times a day (QID) | INTRAMUSCULAR | Status: DC | PRN

## 2015-02-18 MED ORDER — GABAPENTIN 300 MG PO CAPS
300.0000 mg | ORAL_CAPSULE | Freq: Two times a day (BID) | ORAL | Status: DC
  Administered 2015-02-18 – 2015-02-19 (×2): 300 mg via ORAL
  Filled 2015-02-18 (×2): qty 1

## 2015-02-18 MED ORDER — POTASSIUM CHLORIDE CRYS ER 20 MEQ PO TBCR
40.0000 meq | EXTENDED_RELEASE_TABLET | Freq: Once | ORAL | Status: AC
  Administered 2015-02-18: 40 meq via ORAL
  Filled 2015-02-18: qty 2

## 2015-02-18 MED ORDER — FENTANYL 75 MCG/HR TD PT72
75.0000 ug | MEDICATED_PATCH | TRANSDERMAL | Status: DC
  Administered 2015-02-18: 75 ug via TRANSDERMAL
  Filled 2015-02-18: qty 1

## 2015-02-18 MED ORDER — ATORVASTATIN CALCIUM 40 MG PO TABS
40.0000 mg | ORAL_TABLET | Freq: Every day | ORAL | Status: DC
  Administered 2015-02-18: 40 mg via ORAL
  Filled 2015-02-18: qty 1

## 2015-02-18 MED ORDER — GI COCKTAIL ~~LOC~~: 30.0000 mL | Freq: Four times a day (QID) | ORAL | Status: DC | PRN

## 2015-02-18 MED ORDER — LEVETIRACETAM 500 MG PO TABS
1000.0000 mg | ORAL_TABLET | Freq: Two times a day (BID) | ORAL | Status: DC
  Administered 2015-02-18 – 2015-02-19 (×2): 1000 mg via ORAL
  Filled 2015-02-18 (×2): qty 2

## 2015-02-18 MED ORDER — RIVAROXABAN 10 MG PO TABS
10.0000 mg | ORAL_TABLET | Freq: Every day | ORAL | Status: DC
  Administered 2015-02-18 – 2015-02-19 (×2): 10 mg via ORAL
  Filled 2015-02-18 (×5): qty 1

## 2015-02-18 MED ORDER — FLUOXETINE HCL 20 MG PO CAPS
80.0000 mg | ORAL_CAPSULE | Freq: Every day | ORAL | Status: DC
Start: 2015-02-18 — End: 2015-02-19
  Administered 2015-02-18 – 2015-02-19 (×2): 80 mg via ORAL
  Filled 2015-02-18 (×4): qty 4

## 2015-02-18 NOTE — H&P (Signed)
[facility]          History and Physical    PCP:   Chevis Pretty, FNP   EDP: Elnora Morrison, MD  Chief Complaint:  Chest pain  HPI: 54 y/o woman with h/o HTN, HLD, depression, lymphoma and CAD who presents with 15 minutes of sharp CP this morning. Improved with ASA and NTG. Pain radiated to left arm. She was concerned and called EMS who brought her to the hospital for evaluation. Initial troponin is negative, EKG, independently reviewed, with NSR. She reports she had a stress test during the summer that was "normal" per her report. Her cardiologist is at [facility] and she would want to go there if any interventions are required. EDP discussed with cards on call at [facility], Dr. Marzetta Board, who agrees with admission here for cardiac rule out, as long as troponins remain negative, can DC in am with follow up with him in the office next week. We have been asked to admit her for further evaluation and management.  Allergies:   Allergies  Allergen Reactions  . Advicor [Niacin-Lovastatin Er] Rash      Past Medical History  Diagnosis Date  . History of syncope   . Depression   . PVC's (premature ventricular contractions)   . Chronic pain   . Chronic back pain   . Chronic neck pain   . Peripheral edema   . Hyperlipidemia   . Anemia   . Neuropathy ([place])   . SVT (supraventricular tachycardia) ([place])   . Diabetes mellitus without complication ([place]) 3/32/9518    patient denies being diabetic, no meds  . Lymphoma ([place])   . Seizures [facility])     Past Surgical History  Procedure Laterality Date  . Gastric bypass      Says she had lost 345 lbs  . Venous thrombectomy      Blood clot resection from right arm  . Lymph node dissection Left     Resected  . Skin surgery      Removal of excess skin  . Vena cava filter placement    . Lymph node removed from stomach    . Cholecystectomy    . Cataract extraction Right   . Endovenous ablation saphenous vein w/ laser  Right 09-08-2013    EVLA right small saphenous vein and stab phlebectomy 10-20 incisions right leg by Curt Jews MD  . Medial partial knee replacement  05/10/14    left knee    Prior to Admission medications   Medication Sig Start Date End Date Taking? Authorizing Provider  Ascorbic Acid (VITAMIN C) 1000 MG tablet Take 1,000 mg by mouth daily.    Yes Historical Provider, MD  aspirin 81 MG tablet Take 81 mg by mouth daily.   Yes Historical Provider, MD  atorvastatin (LIPITOR) 40 MG tablet Take 1 tablet (40 mg total) by mouth at bedtime. 01/18/15  Yes Mary-Margaret Hassell Done, FNP  Fe Fum-FePoly-Vit C-Vit B3 (INTEGRA) 62.5-62.5-40-3 MG CAPS TAKE ONE CAPSULE BY MOUTH EVERY DAY 01/18/15  Yes Mary-Margaret Hassell Done, FNP  FLUoxetine (PROZAC) 40 MG capsule Take 2 capsules (80 mg total) by mouth daily. 01/18/15  Yes Mary-Margaret Hassell Done, FNP  furosemide (LASIX) 40 MG tablet TAKE 1 TABLET (40 MG TOTAL) BY MOUTH DAILY. 01/18/15  Yes Mary-Margaret Hassell Done, FNP  gabapentin (NEURONTIN) 300 MG capsule TAKE 2 CAPSULE BY MOUTH 2 TIMES DAILY 01/18/15  Yes Mary-Margaret Hassell Done, FNP  levETIRAcetam (KEPPRA) 500  MG tablet TAKE 2 TABLETS BY MOUTH 2 TIMES DAILY 01/18/15  Yes Mary-Margaret Hassell Done, FNP  Multiple Vitamin (MULTIVITAMIN WITH MINERALS) TABS Take 1 tablet by mouth daily.   Yes Historical Provider, MD  nitroGLYCERIN (NITROSTAT) 0.4 MG SL tablet Place 1 tablet (0.4 mg total) under the tongue every 5 (five) minutes as needed for chest pain. 03/23/14  Yes Mary-Margaret Hassell Done, FNP  Nutritional Supplements (ESTROVEN PMS) TABS Take 1 tablet by mouth daily.    Yes Historical Provider, MD  traZODone (DESYREL) 50 MG tablet Take 150 mg by mouth at bedtime.  01/31/15  Yes Historical Provider, MD  XARELTO 10 MG TABS tablet Take 1 tablet (10 mg total) by mouth daily. 01/18/15  Yes Mary-Margaret Hassell Done, FNP  ciprofloxacin (CIPRO) 500 MG tablet Take 1 tablet (500 mg total) by mouth 2 (two) times daily. 01/18/15   Mary-Margaret Hassell Done, FNP   cyanocobalamin (,VITAMIN B-12,) 1000 MCG/ML injection INJECT 1 ML (1,000 MCG TOTAL) INTO THE MUSCLE EVERY 30 (THIRTY) DAYS. 12/07/14   Mary-Margaret Hassell Done, FNP  fentaNYL (DURAGESIC - DOSED MCG/HR) 75 MCG/HR Place 1 patch (75 mcg total) onto the skin every 3 (three) days. 01/18/15   Mary-Margaret Hassell Done, FNP  fluticasone (FLONASE) 50 MCG/ACT nasal spray PLACE 2 SPRAYS INTO THE NOSE AS NEEDED. FOR ALLERGIES 06/28/14   Chipper Herb, MD  loratadine (CLARITIN) 10 MG tablet Take 10 mg by mouth as needed. For allergies    Historical Provider, MD    Social History:  reports that she has never smoked. She has never used smokeless tobacco. She reports that she drinks alcohol. She reports that she does not use illicit drugs.  Family History  Problem Relation Age of Onset  . Coronary artery disease Neg Hx   . Sudden death Neg Hx   . Cardiomyopathy Neg Hx   . Esophageal cancer Neg Hx   . Stomach cancer Neg Hx   . Liver cancer Father   . Diabetes Father   . Kidney cancer Father   . Heart failure Brother   . Heart attack Brother   . Tuberculosis Maternal Grandmother   . Bone cancer Maternal Grandfather   . Diabetes Paternal Uncle     Review of Systems:  Constitutional: Denies fever, chills, diaphoresis, appetite change and fatigue.  HEENT: Denies photophobia, eye pain, redness, hearing loss, ear pain, congestion, sore throat, rhinorrhea, sneezing, mouth sores, trouble swallowing, neck pain, neck stiffness and tinnitus.   Respiratory: Denies SOB, DOE, cough, chest tightness,  and wheezing.   Cardiovascular: Denies chest pain, palpitations and leg swelling.  Gastrointestinal: Denies nausea, vomiting, abdominal pain, diarrhea, constipation, blood in stool and abdominal distention.  Genitourinary: Denies dysuria, urgency, frequency, hematuria, flank pain and difficulty urinating.  Endocrine: Denies: hot or cold intolerance, sweats, changes in hair or nails, polyuria, polydipsia. Musculoskeletal:  Denies myalgias, back pain, joint swelling, arthralgias and gait problem.  Skin: Denies pallor, rash and wound.  Neurological: Denies dizziness, seizures, syncope, weakness, light-headedness, numbness and headaches.  Hematological: Denies adenopathy. Easy bruising, personal or family bleeding history  Psychiatric/Behavioral: Denies suicidal ideation, mood changes, confusion, nervousness, sleep disturbance and agitation   Physical Exam: Blood pressure 114/70, pulse 58, temperature 98.2 F (36.8 C), temperature source Oral, resp. rate 12, height 5' 1.75" (1.568 m), weight 88.451 kg (195 lb), SpO2 93 %. Gen: AA Ox3 HEENT: [place]/AT/PERRL/EOMI Neck: [place], no JVD, no LAD, no bruits, no goiter CV: brady, regular, no M/R/G Lungs: CTA B Abd: S/NT/ND/+BS Ext: no C/C/E/+pulses Neuro: grossly intact and non-focal,  I have not ambulated her.  Labs on Admission:  Results for orders placed or performed during the hospital encounter of 02/18/15 (from the past 48 hour(s))  CBC     Status: Abnormal   Collection Time: 02/18/15 11:50 AM  Result Value Ref Range   WBC 4.3 4.0 - 10.5 K/uL   RBC 3.51 (L) 3.87 - 5.11 MIL/uL   Hemoglobin 11.2 (L) 12.0 - 15.0 g/dL   HCT 33.6 (L) 36.0 - 46.0 %   MCV 95.7 78.0 - 100.0 fL   MCH 31.9 26.0 - 34.0 pg   MCHC 33.3 30.0 - 36.0 g/dL   RDW 11.7 11.5 - 15.5 %   Platelets 118 (L) 150 - 400 K/uL    Comment: SPECIMEN CHECKED FOR CLOTS PLATELET COUNT CONFIRMED BY SMEAR   Comprehensive metabolic panel     Status: Abnormal   Collection Time: 02/18/15 11:50 AM  Result Value Ref Range   Sodium 143 135 - 145 mmol/L   Potassium 3.2 (L) 3.5 - 5.1 mmol/L   Chloride 110 101 - 111 mmol/L   CO2 25 22 - 32 mmol/L   Glucose, Bld 103 (H) 65 - 99 mg/dL   BUN 19 6 - 20 mg/dL   Creatinine, Ser 0.73 0.44 - 1.00 mg/dL   Calcium 8.5 (L) 8.9 - 10.3 mg/dL   Total Protein 5.7 (L) 6.5 - 8.1 g/dL   Albumin 3.3 (L) 3.5 - 5.0 g/dL   AST 25 15 - 41 U/L   ALT 21 14 - 54 U/L   Alkaline  Phosphatase 96 38 - 126 U/L   Total Bilirubin 0.8 0.3 - 1.2 mg/dL   GFR calc non Af Amer >60 >60 mL/min   GFR calc Af Amer >60 >60 mL/min    Comment: (NOTE) The eGFR has been calculated using the CKD EPI equation. This calculation has not been validated in all clinical situations. eGFR's persistently <60 mL/min signify possible Chronic Kidney Disease.    Anion gap 8 5 - 15  Troponin I     Status: None   Collection Time: 02/18/15 11:50 AM  Result Value Ref Range   Troponin I <0.03 <0.031 ng/mL    Comment:        NO INDICATION OF MYOCARDIAL INJURY.   D-dimer, quantitative (not at [facility])     Status: None   Collection Time: 02/18/15 11:50 AM  Result Value Ref Range   D-Dimer, Quant 0.31 0.00 - 0.48 ug/mL-FEU    Comment:        AT THE INHOUSE ESTABLISHED CUTOFF VALUE OF 0.48 ug/mL FEU, THIS ASSAY HAS BEEN DOCUMENTED IN THE LITERATURE TO HAVE A SENSITIVITY AND NEGATIVE PREDICTIVE VALUE OF AT LEAST 98 TO 99%.  THE TEST RESULT SHOULD BE CORRELATED WITH AN ASSESSMENT OF THE CLINICAL PROBABILITY OF DVT / VTE.     Radiological Exams on Admission: Dg Chest Portable 1 View  02/18/2015  CLINICAL DATA:  Chest pain and shortness of breath. EXAM: PORTABLE CHEST 1 VIEW COMPARISON:  None. FINDINGS: The heart size and mediastinal contours are within normal limits. Both lungs are clear. The visualized skeletal structures are unremarkable. IMPRESSION: Normal chest. Electronically Signed   By: Lorriane Shire M.D.   On: 02/18/2015 12:55    Assessment/Plan Principal Problem:   Acute chest pain Active Problems:   Hyperlipidemia with target LDL less than 100   Hypokalemia   Chest pain   Lymphoma (HCC)    Chest Pain -Admit to telemetry for cardiac rule out. -If  troponins remain negative, as discussed with her cardiologist, plan to DC home in am for follow up with cardiology at [facility]. -If rules in, she prefers transfer to [facility] for further work up.  Rest of chronic conditions stable  and home medications will be continued.    Time Spent on Admission: 55 minutes.  Lelon Frohlich [facility] Pager: [phone] 02/18/2015, 4:38 PM

## 2015-02-18 NOTE — ED Notes (Signed)
Pt complain of chest pain that started some time this morning. Denies any other symptoms. Pt states she took one NTG and 324 mg ASA

## 2015-02-18 NOTE — ED Provider Notes (Signed)
CSN: [id number]     Arrival date & time 02/18/15  1135 History  By signing my name below, I, Hilda Lias, attest that this documentation has been prepared under the direction and in the presence of Elnora Morrison, MD. Electronically Signed: Hilda Lias, ED Scribe. 02/18/2015. 1:38 PM.    Chief Complaint  Patient presents with  . Chest Pain      HPI Comments: 54 year old female with history of depression, lipids, SVT, obesity, lymphoma currently in remission, neutropenic fever, vena cava filter, from cytopenia presents after 15 minute episode of sharp chest pain proximal May 10:00 today. Patient has had milder pain similar in the past but nothing recent. Mild radiation down left arm. Symptoms completely resolved after nitroglycerin. Patient follows with Dr. Ermalene Searing at [facility], stress test in September with no ischemia. Patient had aspirin today and takes Xarelto regularly. Patient denies any history of blood clot and does not know exactly why she is on Xarelto. Patient had a failed partial knee replacement for which is scheduled for complete on the left, that was in January. No focal leg swelling, no active cancer treatment, no shortness of breath. Patient was pale during the event.  The history is provided by the patient and medical records.   HPI Comments: Angela Michael is a 54 y.o. female who presents to the Emergency Department complaining of   Past Medical History  Diagnosis Date  . History of syncope   . Depression   . PVC's (premature ventricular contractions)   . Chronic pain   . Chronic back pain   . Chronic neck pain   . Peripheral edema   . Hyperlipidemia   . Anemia   . Neuropathy ([place])   . SVT (supraventricular tachycardia) ([place])   . Diabetes mellitus without complication ([place]) 0/86/5784    patient denies being diabetic, no meds  . Lymphoma ([place])   . Seizures [facility])    Past Surgical History  Procedure Laterality Date  . Gastric bypass      Says she had lost 345  lbs  . Venous thrombectomy      Blood clot resection from right arm  . Lymph node dissection Left     Resected  . Skin surgery      Removal of excess skin  . Vena cava filter placement    . Lymph node removed from stomach    . Cholecystectomy    . Cataract extraction Right   . Endovenous ablation saphenous vein w/ laser Right 09-08-2013    EVLA right small saphenous vein and stab phlebectomy 10-20 incisions right leg by Curt Jews MD  . Medial partial knee replacement  05/10/14    left knee   Family History  Problem Relation Age of Onset  . Coronary artery disease Neg Hx   . Sudden death Neg Hx   . Cardiomyopathy Neg Hx   . Esophageal cancer Neg Hx   . Stomach cancer Neg Hx   . Liver cancer Father   . Diabetes Father   . Kidney cancer Father   . Heart failure Brother   . Heart attack Brother   . Tuberculosis Maternal Grandmother   . Bone cancer Maternal Grandfather   . Diabetes Paternal Uncle    Social History  Substance Use Topics  . Smoking status: Never Smoker   . Smokeless tobacco: Never Used  . Alcohol Use: 0.0 oz/week    0 Standard drinks or equivalent per week     Comment: Occasionally   OB History  Gravida Para Term Preterm AB TAB SAB Ectopic Multiple Living   2 2 2             Review of Systems  Constitutional: Negative for fever and chills.  HENT: Negative for congestion.   Eyes: Negative for visual disturbance.  Respiratory: Negative for shortness of breath.   Cardiovascular: Positive for chest pain.  Gastrointestinal: Negative for vomiting and abdominal pain.  Genitourinary: Negative for dysuria and flank pain.  Musculoskeletal: Negative for back pain, neck pain and neck stiffness.  Skin: Negative for rash.  Neurological: Positive for light-headedness and headaches (gradual onset).      Allergies  Advicor  Home Medications   Prior to Admission medications   Medication Sig Start Date End Date Taking? Authorizing Provider  Ascorbic Acid  (VITAMIN C) 1000 MG tablet Take 1,000 mg by mouth daily.     Historical Provider, MD  aspirin 81 MG tablet Take 81 mg by mouth daily.    Historical Provider, MD  atorvastatin (LIPITOR) 40 MG tablet Take 1 tablet (40 mg total) by mouth at bedtime. 01/18/15   Mary-Margaret Hassell Done, FNP  ciprofloxacin (CIPRO) 500 MG tablet Take 1 tablet (500 mg total) by mouth 2 (two) times daily. 01/18/15   Mary-Margaret Hassell Done, FNP  cyanocobalamin (,VITAMIN B-12,) 1000 MCG/ML injection INJECT 1 ML (1,000 MCG TOTAL) INTO THE MUSCLE EVERY 30 (THIRTY) DAYS. 12/07/14   Mary-Margaret Hassell Done, FNP  Fe Fum-FePoly-Vit C-Vit B3 (INTEGRA) 62.5-62.5-40-3 MG CAPS TAKE ONE CAPSULE BY MOUTH EVERY DAY 01/18/15   Mary-Margaret Hassell Done, FNP  fentaNYL (DURAGESIC - DOSED MCG/HR) 75 MCG/HR Place 1 patch (75 mcg total) onto the skin every 3 (three) days. 01/18/15   Mary-Margaret Hassell Done, FNP  FLUoxetine (PROZAC) 40 MG capsule Take 2 capsules (80 mg total) by mouth daily. 01/18/15   Mary-Margaret Hassell Done, FNP  fluticasone (FLONASE) 50 MCG/ACT nasal spray PLACE 2 SPRAYS INTO THE NOSE AS NEEDED. FOR ALLERGIES 06/28/14   Chipper Herb, MD  furosemide (LASIX) 40 MG tablet TAKE 1 TABLET (40 MG TOTAL) BY MOUTH DAILY. 01/18/15   Mary-Margaret Hassell Done, FNP  gabapentin (NEURONTIN) 300 MG capsule TAKE 2 CAPSULE BY MOUTH 2 TIMES DAILY 01/18/15   Mary-Margaret Hassell Done, FNP  levETIRAcetam (KEPPRA) 500 MG tablet TAKE 2 TABLETS BY MOUTH 2 TIMES DAILY 01/18/15   Mary-Margaret Hassell Done, FNP  loratadine (CLARITIN) 10 MG tablet Take 10 mg by mouth as needed. For allergies    Historical Provider, MD  Multiple Vitamin (MULTIVITAMIN WITH MINERALS) TABS Take 1 tablet by mouth daily.    Historical Provider, MD  nitroGLYCERIN (NITROSTAT) 0.4 MG SL tablet Place 1 tablet (0.4 mg total) under the tongue every 5 (five) minutes as needed for chest pain. 03/23/14   Mary-Margaret Hassell Done, FNP  Nutritional Supplements (ESTROVEN PMS) TABS Take 1 tablet by mouth daily.     Historical Provider,  MD  XARELTO 10 MG TABS tablet Take 1 tablet (10 mg total) by mouth daily. 01/18/15   Mary-Margaret Hassell Done, FNP   BP 90/63 mmHg  Pulse 55  Temp(Src) 97.8 F (36.6 C) (Oral)  Resp 16  Ht 5' 1.75" (1.568 m)  Wt 195 lb (88.451 kg)  BMI 35.98 kg/m2  SpO2 96% Physical Exam  Constitutional: She is oriented to person, place, and time. She appears well-developed and well-nourished.  HENT:  Head: Normocephalic and atraumatic.  Eyes: Conjunctivae are normal. Right eye exhibits no discharge. Left eye exhibits no discharge.  Neck: Normal range of motion. Neck supple. No tracheal deviation present.  Cardiovascular: Normal  rate and regular rhythm.   Pulmonary/Chest: Effort normal and breath sounds normal.  Abdominal: Soft. She exhibits no distension. There is no tenderness. There is no guarding.  Musculoskeletal: She exhibits no edema.  Neurological: She is alert and oriented to person, place, and time. No cranial nerve deficit.  Skin: Skin is warm and dry. No rash noted.  Psychiatric: She has a normal mood and affect.  Nursing note and vitals reviewed.   ED Course  Procedures (including critical care time)   EMERGENCY DEPARTMENT [place] CARDIAC EXAM "Study: Limited Ultrasound of the heart and pericardium"  INDICATIONS:low voltage EKG Multiple views of the heart and pericardium were obtained in real-time with a multi-frequency probe.  PERFORMED UU:EKCMKL  IMAGES ARCHIVED?: Yes  FINDINGS: No pericardial effusion and Normal contractility  LIMITATIONS:  Body habitus  VIEWS USED: Subcostal 4 chamber, Parasternal long axis and Parasternal short axis  INTERPRETATION: Cardiac activity present and Pericardial effusioin absent  CPT Code: 49179-15 (limited transthoracic cardiac)  DIAGNOSTIC STUDIES: Oxygen Saturation is 96% on my interpretation.  COORDINATION OF CARE: 1:38 PM Discussed treatment plan with pt at bedside and pt agreed to plan.   Labs Review Labs Reviewed  CBC - Abnormal;  Notable for the following:    RBC 3.51 (*)    Hemoglobin 11.2 (*)    HCT 33.6 (*)    Platelets 118 (*)    All other components within normal limits  COMPREHENSIVE METABOLIC PANEL - Abnormal; Notable for the following:    Potassium 3.2 (*)    Glucose, Bld 103 (*)    Calcium 8.5 (*)    Total Protein 5.7 (*)    Albumin 3.3 (*)    All other components within normal limits  TROPONIN I  D-DIMER, QUANTITATIVE (NOT AT [facility])    Imaging Review Dg Chest Portable 1 View  02/18/2015  CLINICAL DATA:  Chest pain and shortness of breath. EXAM: PORTABLE CHEST 1 VIEW COMPARISON:  None. FINDINGS: The heart size and mediastinal contours are within normal limits. Both lungs are clear. The visualized skeletal structures are unremarkable. IMPRESSION: Normal chest. Electronically Signed   By: Lorriane Shire M.D.   On: 02/18/2015 12:55   I have personally reviewed and evaluated these images and lab results as part of my medical decision-making.   EKG Interpretation   Date/Time:  Sunday February 18 2015 12:00:11 EST Ventricular Rate:  60 PR Interval:  188 QRS Duration: 74 QT Interval:  504 QTC Calculation: 504 R Axis:   4 Text Interpretation:  Sinus rhythm Low voltage, precordial leads  Borderline T abnormalities, anterior leads Borderline prolonged QT  interval Similar to previous Confirmed by Mykenzie Ebanks  MD, Collen Vincent ([id number]) on  02/18/2015 1:08:45 PM     EKG reviewed heart rate 60, mild low voltage, T-wave flattening and inversions similar to previous, mild prolonged QT. MDM   Final diagnoses:  Chest pain, unspecified chest pain type   Patient presents after 15 minute episode of sharp chest pain. Patient has family history of cardiac and multiple risk factors. Plan for delta troponin. Discussed observation the hospital versus close outpatient follow-up. Patient prefers contact with [facility] cardiology to discuss and is comfortable with staying local if no intervention indicated. Patient's EKG is abnormal  however similar to previous. Patient's troponin and d-dimer negative. Patient on reassessment has no pain well-appearing. Paged cardiology about this.  Spoke with Dr. Marzetta Board on call for cardiology at [facility]. Discussed the case including recent negative perfusion/stress testing and patient asymptomatic in the ER. He  agrees with eating the patient here for serial troponins from the hospitalist and no need for more aggressive testing/transfer at this time unless troponin elevates.  The patients results and plan were reviewed and discussed.   Any x-rays performed were independently reviewed by myself.   Differential diagnosis were considered with the presenting HPI.  Medications - No data to display  Filed Vitals:   02/18/15 1139 02/18/15 1148 02/18/15 1200 02/18/15 1258  BP:  94/48 107/65 90/63  Pulse:  62 59 55  Temp:  98.3 F (36.8 C)  97.8 F (36.6 C)  TempSrc:  Oral  Oral  Resp:  16 12 16   Height: 5' 1.75" (1.568 m)     Weight: 195 lb (88.451 kg)     SpO2:  91% 93% 96%    Final diagnoses:  Chest pain, unspecified chest pain type    Admission/ observation were discussed with the admitting physician, patient and/or family and they are comfortable with the plan.    Elnora Morrison, MD 02/18/15 2039

## 2015-02-18 NOTE — ED Notes (Signed)
Called [facility]ALS line for cardiologist on call for [facility] per Dr. Reather Converse verbal order.

## 2015-02-19 DIAGNOSIS — R079 Chest pain, unspecified: Secondary | ICD-10-CM | POA: Diagnosis not present

## 2015-02-19 DIAGNOSIS — E785 Hyperlipidemia, unspecified: Secondary | ICD-10-CM | POA: Diagnosis not present

## 2015-02-19 DIAGNOSIS — R51 Headache: Secondary | ICD-10-CM | POA: Diagnosis not present

## 2015-02-19 DIAGNOSIS — E876 Hypokalemia: Secondary | ICD-10-CM | POA: Diagnosis not present

## 2015-02-19 DIAGNOSIS — G8929 Other chronic pain: Secondary | ICD-10-CM | POA: Diagnosis not present

## 2015-02-19 DIAGNOSIS — R42 Dizziness and giddiness: Secondary | ICD-10-CM | POA: Diagnosis not present

## 2015-02-19 LAB — BASIC METABOLIC PANEL
ANION GAP: 9 (ref 5–15)
BUN: 13 mg/dL (ref 6–20)
CHLORIDE: 107 mmol/L (ref 101–111)
CO2: 27 mmol/L (ref 22–32)
Calcium: 8.5 mg/dL — ABNORMAL LOW (ref 8.9–10.3)
Creatinine, Ser: 0.78 mg/dL (ref 0.44–1.00)
GFR calc non Af Amer: >60 mL/min (ref >60)
Glucose, Bld: 88 mg/dL (ref 65–99)
Potassium: 3.1 mmol/L — ABNORMAL LOW (ref 3.5–5.1)
Sodium: 143 mmol/L (ref 135–145)

## 2015-02-19 LAB — TROPONIN I
Troponin I: <0.03 ng/mL (ref <0.031)
Troponin I: <0.03 ng/mL (ref <0.031)

## 2015-02-19 MED ORDER — POTASSIUM CHLORIDE CRYS ER 20 MEQ PO TBCR
40.0000 meq | EXTENDED_RELEASE_TABLET | Freq: Once | ORAL | Status: AC
  Administered 2015-02-19: 40 meq via ORAL
  Filled 2015-02-19: qty 2

## 2015-02-19 MED ORDER — LOPERAMIDE HCL 2 MG PO TABS: 2.0000 mg | ORAL_TABLET | Freq: Four times a day (QID) | ORAL | Status: DC | PRN

## 2015-02-19 MED ORDER — GABAPENTIN 300 MG PO CAPS
600.0000 mg | ORAL_CAPSULE | Freq: Two times a day (BID) | ORAL | Status: DC
  Administered 2015-02-19: 600 mg via ORAL
  Filled 2015-02-19: qty 2

## 2015-02-19 NOTE — Discharge Summary (Signed)
Physician Discharge Summary   Patient ID: Angela Michael MRN: [id number] DOB/AGE: 52/21/1962 52 y.o.  Admit date: 02/18/2015 Discharge date: 02/19/2015  Primary Care Physician:  Chevis Pretty, FNP  Discharge Diagnoses:    . Acute chest pain resolved  . Hyperlipidemia with target LDL less than 100 . Lymphoma ([place]) . Hypokalemia . CAD  Consults:  None   Recommendations for Outpatient Follow-up: Patient was ruled out, troponins remained negative. Patient recommended to follow-up with her cardiologist, Dr. Ermalene Searing at [facility]   DIET: Heart healthy diet    Allergies:   Allergies  Allergen Reactions  . Advicor [Niacin-Lovastatin Er] Rash     Discharge Medications:   Medication List    STOP taking these medications        ciprofloxacin 500 MG tablet  Commonly known as:  CIPRO      TAKE these medications        aspirin 81 MG tablet  Take 81 mg by mouth daily.     atorvastatin 40 MG tablet  Commonly known as:  LIPITOR  Take 1 tablet (40 mg total) by mouth at bedtime.     cyanocobalamin 1000 MCG/ML injection  Commonly known as:  (VITAMIN B-12)  INJECT 1 ML (1,000 MCG TOTAL) INTO THE MUSCLE EVERY 30 (THIRTY) DAYS.     ESTROVEN PMS Tabs  Take 1 tablet by mouth daily.     fentaNYL 75 MCG/HR  Commonly known as:  DURAGESIC - dosed mcg/hr  Place 1 patch (75 mcg total) onto the skin every 3 (three) days.     FLUoxetine 40 MG capsule  Commonly known as:  PROZAC  Take 2 capsules (80 mg total) by mouth daily.     fluticasone 50 MCG/ACT nasal spray  Commonly known as:  FLONASE  PLACE 2 SPRAYS INTO THE NOSE AS NEEDED. FOR ALLERGIES     furosemide 40 MG tablet  Commonly known as:  LASIX  TAKE 1 TABLET (40 MG TOTAL) BY MOUTH DAILY.     gabapentin 300 MG capsule  Commonly known as:  NEURONTIN  TAKE 2 CAPSULE BY MOUTH 2 TIMES DAILY     INTEGRA 62.5-62.5-40-3 MG Caps  TAKE ONE CAPSULE BY MOUTH EVERY DAY      levETIRAcetam 500 MG tablet  Commonly known as:  KEPPRA  TAKE 2 TABLETS BY MOUTH 2 TIMES DAILY     loperamide 2 MG tablet  Commonly known as:  IMODIUM A-D  Take 1 tablet (2 mg total) by mouth 4 (four) times daily as needed for diarrhea or loose stools (over the counter).     loratadine 10 MG tablet  Commonly known as:  CLARITIN  Take 10 mg by mouth as needed. For allergies     multivitamin with minerals Tabs tablet  Take 1 tablet by mouth daily.     nitroGLYCERIN 0.4 MG SL tablet  Commonly known as:  NITROSTAT  Place 1 tablet (0.4 mg total) under the tongue every 5 (five) minutes as needed for chest pain.     traZODone 50 MG tablet  Commonly known as:  DESYREL  Take 150 mg by mouth at bedtime.     vitamin C 1000 MG tablet  Take 1,000 mg by mouth daily.     XARELTO 10 MG Tabs tablet  Generic drug:  rivaroxaban  Take 1 tablet (10 mg total) by mouth daily.         Brief H and P: For complete details please refer  to admission H and P, but in brief 54 y/o woman with h/o HTN, HLD, depression, lymphoma and CAD who presents with 15 minutes of sharp CP this morning. Improved with ASA and NTG. Pain radiated to left arm. She was concerned and called EMS who brought her to the hospital for evaluation. Initial troponin is negative, EKG, independently reviewed, with NSR. She reports she had a stress test during the summer that was "normal" per her report. Her cardiologist is at [facility] and she would want to go there if any interventions are required. EDP discussed with cards on call at [facility], Dr. Marzetta Board, who agreed with admission here for cardiac rule out, as long as troponins remain negative, can DC in am with follow up with him in the office next week.  Hospital Course:  Chest pain Patient was admitted for chest pain rule out. Patient has a history of coronary heart disease, had urinary angiography November 2010 that showed nonobstructive CAD. Patient also follows Dr. Ermalene Searing at [facility], stress test in September showed no ischemia. Patient was admitted, ruled out for acute ACS, troponins remain negative, EKG did not show any acute ST-T wave changes suggestive of ischemia. EDP, Dr. Reather Converse had discussed with her cardiologist who recommended follow-up outpatient in the office and overnight observation to rule out. D-dimer was negative. Chest x-ray did not show any acute pneumonia Patient has no further chest pain, troponins remain negative, patient will be discharged home today.  Hypokalemia Replaced  Day of Discharge BP 87/46 mmHg  Pulse 44  Temp(Src) 97.6 F (36.4 C) (Oral)  Resp 18  Ht 5' 1.75" (1.568 m)  Wt 88.451 kg (195 lb)  BMI 35.98 kg/m2  SpO2 95%  Physical Exam: General: Alert and awake oriented x3 not in any acute distress. HEENT: anicteric sclera, pupils reactive to light and accommodation CVS: S1-S2 clear no murmur rubs or gallops Chest: clear to auscultation bilaterally, no wheezing rales or rhonchi Abdomen: soft nontender, nondistended, normal bowel sounds Extremities: no cyanosis, clubbing or edema noted bilaterally Neuro: Cranial nerves II-XII intact, no focal neurological deficits   The results of significant diagnostics from this hospitalization (including imaging, microbiology, ancillary and laboratory) are listed below for reference.    LAB RESULTS: Basic Metabolic Panel:  Recent Labs Lab 02/18/15 1150 02/19/15 0454  NA 143 143  K 3.2* 3.1*  CL 110 107  CO2 25 27  GLUCOSE 103* 88  BUN 19 13  CREATININE 0.73 0.78  CALCIUM 8.5* 8.5*   Liver Function Tests:  Recent Labs Lab 02/18/15 1150  AST 25  ALT 21  ALKPHOS 96  BILITOT 0.8  PROT 5.7*  ALBUMIN 3.3*   No results for input(s): LIPASE, AMYLASE in the last 168 hours. No results for input(s): AMMONIA in the last 168 hours. CBC:  Recent Labs Lab 02/18/15 1150  WBC 4.3  HGB 11.2*  HCT 33.6*  MCV 95.7  PLT 118*   Cardiac Enzymes:  Recent Labs Lab  02/18/15 2325 02/19/15 0454  TROPONINI <0.03 <0.03   BNP: Invalid input(s): POCBNP CBG: No results for input(s): GLUCAP in the last 168 hours.  Significant Diagnostic Studies:  Dg Chest Portable 1 View  02/18/2015  CLINICAL DATA:  Chest pain and shortness of breath. EXAM: PORTABLE CHEST 1 VIEW COMPARISON:  None. FINDINGS: The heart size and mediastinal contours are within normal limits. Both lungs are clear. The visualized skeletal structures are unremarkable. IMPRESSION: Normal chest. Electronically Signed   By: Lorriane Shire M.D.  On: 02/18/2015 12:55    2D ECHO:   Disposition and Follow-up:    DISPOSITION: Home   DISCHARGE FOLLOW-UP     Follow-up Information    Follow up with Chevis Pretty, FNP. Schedule an appointment as soon as possible for a visit in 10 days.   Specialty:  Family Medicine   Why:  for hospital follow-up   Contact information:   [place] [address] [phone]       Follow up with Kerry Hough., MD. Schedule an appointment as soon as possible for a visit in 1 week.   Specialty:  Internal Medicine   Why:  for hospital follow-up   Contact information:   [place] [address] [phone]        Time spent on Discharge: 32 minutes  Signed:   Devany Aja M.D. [facility] 02/19/2015, 10:28 AM Pager: [phone]

## 2015-02-19 NOTE — Progress Notes (Signed)
PT Cancellation Note  Patient Details Name: Angela Michael MRN: [id number] DOB: 1960-06-26   Cancelled Treatment:    Reason Eval/Treat Not Completed: PT screened, no needs identified, will sign off   Sable Feil  PT 02/19/2015, 9:28 AM [phone]

## 2015-02-19 NOTE — Final Progress Note (Signed)
Patient discharged with instructions, prescription, and care notes.  Verbalized understanding via teach back.  IV was removed and the site was WNL. Patient voiced no further complaints or concerns at the time of discharge.  Appointments scheduled per instructions.  Patient left the floor via w/c with staff and family in stable condition.   Dr. Tana Coast was notified prior to the patient being discharged that the patient had not been able to have another stool since the C. Diff order.  MD verbalized understanding.  MD stated it was okay it was for discharge. Appointments were hand written on the d/c forms.

## 2015-03-01 ENCOUNTER — Ambulatory Visit (INDEPENDENT_AMBULATORY_CARE_PROVIDER_SITE_OTHER): Payer: Medicaid Other | Admitting: Nurse Practitioner

## 2015-03-01 ENCOUNTER — Encounter: Payer: Self-pay | Admitting: Nurse Practitioner

## 2015-03-01 VITALS — BP 105/65 | HR 44 | Temp 97.3°F | Ht 61.0 in | Wt 194.0 lb

## 2015-03-01 DIAGNOSIS — R079 Chest pain, unspecified: Secondary | ICD-10-CM | POA: Diagnosis not present

## 2015-03-01 DIAGNOSIS — G5691 Unspecified mononeuropathy of right upper limb: Secondary | ICD-10-CM

## 2015-03-01 DIAGNOSIS — Z09 Encounter for follow-up examination after completed treatment for conditions other than malignant neoplasm: Secondary | ICD-10-CM

## 2015-03-01 MED ORDER — FENTANYL 75 MCG/HR TD PT72: 75.0000 ug | MEDICATED_PATCH | TRANSDERMAL | Status: DC

## 2015-03-01 NOTE — Progress Notes (Signed)
   Subjective:    Patient ID: Angela Michael, female    DOB: 1961-04-07, 54 y.o.   MRN: [id number]  HPI Patient here todsy for hospitsl follow up- she went to the ER at [facility] last Sunday with chest pain- SHe has a history of eart disease but al of her test were negative for MI- they told her it may have been a warni8ng- SHe is to follow up with cardiologist in January and soner if redevelpos chest pain- SHe denies any chest pain since being discharged form hospital. No SOB.    Review of Systems  Constitutional: Positive for fatigue.  HENT: Negative.   Respiratory: Negative for chest tightness and shortness of breath.   Cardiovascular: Positive for palpitations (occasional). Negative for chest pain and leg swelling.  Genitourinary: Negative.   Neurological: Negative.   Psychiatric/Behavioral: Negative.   All other systems reviewed and are negative.      Objective:   Physical Exam  Constitutional: She is oriented to person, place, and time. She appears well-developed and well-nourished.  Cardiovascular: Normal rate.   Murmur (1/6 systolic murmur) heard. Pulmonary/Chest: Effort normal and breath sounds normal.  Neurological: She is alert and oriented to person, place, and time.  Skin: Skin is warm and dry.  Psychiatric: She has a normal mood and affect. Her behavior is normal. Judgment and thought content normal.   BP 105/65 mmHg  Pulse 44  Temp(Src) 97.3 F (36.3 C) (Oral)  Ht 5\' 1"  (1.549 m)  Wt 194 lb (87.998 kg)  BMI 36.67 kg/m2        Assessment & Plan:  1. Hospital discharge follow-up Hospital records reviewed  2. Chest pain, unspecified chest pain type If reoccurs to ER or contact cardiologist No strenius activity until has follow up with cardiologist  3. Neuropathy of upper extremity, right - fentaNYL (DURAGESIC - DOSED MCG/HR) 75 MCG/HR; Place 1 patch (75 mcg total) onto the skin every 3 (three) days.  Dispense: 10 patch; Refill: 0  Angela  Hassell Done, FNP

## 2015-03-01 NOTE — Patient Instructions (Signed)

## 2015-03-14 ENCOUNTER — Telehealth: Payer: Self-pay | Admitting: *Deleted

## 2015-03-14 NOTE — Telephone Encounter (Signed)
Medicaid nurse is questioning patient's need for xarelto.  Patient was given script by orthopedic surgeon  After her operation.  Patient unsure why she needs this but it gets refilled each time.  Please advise.

## 2015-03-15 NOTE — Telephone Encounter (Signed)
Call ortho and ask if she can stop- they usually only do for a temporary period of time.

## 2015-03-15 NOTE — Telephone Encounter (Addendum)
Note reviewed from ortho surgery through [place]. This was suppose to be a short 6 week course of Xarelto s/p surgery in 04/2014. She has a history of a DVT in 2005 and had a filter placed. No recurrence.  She has no other medical conditions that would require Xarelto.  Advised that it seems reasonable to discontinue at this time. She has an appt with cardio tomorrow and will mention this to them as well to make sure they do not see any contraindication to discontinuing it.

## 2015-03-16 ENCOUNTER — Encounter: Payer: Self-pay | Admitting: Cardiovascular Disease

## 2015-03-16 ENCOUNTER — Ambulatory Visit (INDEPENDENT_AMBULATORY_CARE_PROVIDER_SITE_OTHER): Payer: Medicaid Other | Admitting: Cardiovascular Disease

## 2015-03-16 VITALS — BP 103/70 | HR 42 | Ht 61.0 in | Wt 193.6 lb

## 2015-03-16 DIAGNOSIS — R079 Chest pain, unspecified: Secondary | ICD-10-CM

## 2015-03-16 DIAGNOSIS — I2510 Atherosclerotic heart disease of native coronary artery without angina pectoris: Secondary | ICD-10-CM

## 2015-03-16 DIAGNOSIS — Z8249 Family history of ischemic heart disease and other diseases of the circulatory system: Secondary | ICD-10-CM

## 2015-03-16 DIAGNOSIS — R001 Bradycardia, unspecified: Secondary | ICD-10-CM

## 2015-03-16 DIAGNOSIS — E785 Hyperlipidemia, unspecified: Secondary | ICD-10-CM

## 2015-03-16 DIAGNOSIS — Z9289 Personal history of other medical treatment: Secondary | ICD-10-CM

## 2015-03-16 DIAGNOSIS — I471 Supraventricular tachycardia: Secondary | ICD-10-CM | POA: Diagnosis not present

## 2015-03-16 DIAGNOSIS — Z95828 Presence of other vascular implants and grafts: Secondary | ICD-10-CM

## 2015-03-16 DIAGNOSIS — Z87898 Personal history of other specified conditions: Secondary | ICD-10-CM

## 2015-03-16 DIAGNOSIS — I2583 Coronary atherosclerosis due to lipid rich plaque: Secondary | ICD-10-CM

## 2015-03-16 NOTE — Patient Instructions (Signed)
Continue all current medications. Your physician wants you to follow up in:  1 year.  You will receive a reminder letter in the mail one-two months in advance.  If you don't receive a letter, please call our office to schedule the follow up appointment   

## 2015-03-16 NOTE — Progress Notes (Signed)
Patient ID: Angela Michael, female   DOB: 05-Apr-1961, 54 y.o.   MRN: [id number]      SUBJECTIVE: The patient presents for post hospitalization follow-up for chest pain. She was hospitalized in early November. She ruled out for an acute coronary syndrome with normal troponins and EKG was without ischemic changes. D-dimer was negative and chest x-ray did not show any acute abnormalities. She underwent a nuclear stress test on 12/29/2014 at [facility] which demonstrated normal myocardial perfusion. Echocardiogram in August 2016 showed normal left ventricular systolic function and regional wall motion, EF 55-60%, mild biatrial dilatation with mild tricuspid regurgitation and mild pulmonary hypertension. She sees Dr. Roselyn Bering there and saw him on 12/21/14 for the evaluation of pulmonary hypertension, deemed secondary to obesity and prior obstructive sleep apnea a sleep study was recommended. She also has asymptomatic bradycardia.  She also has a h/o SVT. 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 She has nonobstructive coronary artery disease as per coronary angiography performed in November 2010 at [facility], following a false positive stress test. At that time, cath on 02/16/09 showed proximal LAD 25%, prox RCA 25%, and long proximal-mid 50% lesion. LV gram EF 60% (normal).  Denies exertional chest pain and dizziness.   Review of Systems: As per "subjective", otherwise negative.  Allergies  Allergen Reactions  . Advicor [Niacin-Lovastatin Er] Rash    Current Outpatient Prescriptions  Medication Sig Dispense Refill  . Ascorbic Acid (VITAMIN C) 1000 MG tablet Take 1,000 mg by mouth daily.     Marland Kitchen aspirin 81 MG tablet Take 81 mg by mouth daily.    Marland Kitchen atorvastatin (LIPITOR) 40 MG tablet Take 1 tablet (40 mg total) by mouth at bedtime. 30 tablet 5  . cyanocobalamin (,VITAMIN B-12,) 1000 MCG/ML injection  INJECT 1 ML (1,000 MCG TOTAL) INTO THE MUSCLE EVERY 30 (THIRTY) DAYS. 30 mL 2  . Fe Fum-FePoly-Vit C-Vit B3 (INTEGRA) 62.5-62.5-40-3 MG CAPS TAKE ONE CAPSULE BY MOUTH EVERY DAY 30 capsule 5  . fentaNYL (DURAGESIC - DOSED MCG/HR) 75 MCG/HR Place 1 patch (75 mcg total) onto the skin every 3 (three) days. 10 patch 0  . FLUoxetine (PROZAC) 40 MG capsule Take 2 capsules (80 mg total) by mouth daily. 60 capsule 5  . fluticasone (FLONASE) 50 MCG/ACT nasal spray PLACE 2 SPRAYS INTO THE NOSE AS NEEDED. FOR ALLERGIES 16 g 5  . furosemide (LASIX) 40 MG tablet TAKE 1 TABLET (40 MG TOTAL) BY MOUTH DAILY. 30 tablet 5  . gabapentin (NEURONTIN) 300 MG capsule TAKE 2 CAPSULE BY MOUTH 2 TIMES DAILY 120 capsule 5  . levETIRAcetam (KEPPRA) 500 MG tablet TAKE 2 TABLETS BY MOUTH 2 TIMES DAILY 120 tablet 5  . loperamide (IMODIUM A-D) 2 MG tablet Take 1 tablet (2 mg total) by mouth 4 (four) times daily as needed for diarrhea or loose stools (over the counter). 30 tablet 0  . loratadine (CLARITIN) 10 MG tablet Take 10 mg by mouth as needed. For allergies    . Multiple Vitamin (MULTIVITAMIN WITH MINERALS) TABS Take 1 tablet by mouth daily.    . nitroGLYCERIN (NITROSTAT) 0.4 MG SL tablet Place 1 tablet (0.4 mg total) under the tongue every 5 (five) minutes as needed for chest pain. 25 tablet 3  . Nutritional Supplements (ESTROVEN PMS) TABS Take 1 tablet by mouth daily.     . traZODone (DESYREL) 50 MG tablet Take 150 mg by mouth at bedtime.  1   No current facility-administered medications for this visit.    Past Medical History  Diagnosis Date  . History of syncope   . Depression   . PVC's (premature ventricular contractions)   . Chronic pain   . Chronic back pain   . Chronic neck pain   . Peripheral edema   . Hyperlipidemia   . Anemia   . Neuropathy ([place])   . SVT (supraventricular tachycardia) ([place])   . Diabetes mellitus without complication ([place]) [id number]    patient denies being diabetic, no meds  .  Lymphoma ([place])   . Seizures [facility])     Past Surgical History  Procedure Laterality Date  . Gastric bypass      Says she had lost 345 lbs  . Venous thrombectomy      Blood clot resection from right arm  . Lymph node dissection Left     Resected  . Skin surgery      Removal of excess skin  . Vena cava filter placement    . Lymph node removed from stomach    . Cholecystectomy    . Cataract extraction Right   . Endovenous ablation saphenous vein w/ laser Right 09-08-2013    EVLA right small saphenous vein and stab phlebectomy 10-20 incisions right leg by Curt Jews MD  . Medial partial knee replacement  05/10/14    left knee    Social History   Social History  . Marital Status: Married    Spouse Name: N/A  . Number of Children: 2  . Years of Education: N/A   Occupational History  . Currently does not work    Social History Main Topics  . Smoking status: Never Smoker   . Smokeless tobacco: Never Used  . Alcohol Use: 0.0 oz/week    0 Standard drinks or equivalent per week     Comment: Occasionally  . Drug Use: No  . Sexual Activity: Not on file   Other Topics Concern  . Not on file   Social History Narrative   Married with 2 children     Filed Vitals:   03/16/15 0919  BP: 103/70  Pulse: 42  Height: 5\' 1"  (1.549 m)  Weight: 193 lb 9.6 oz (87.816 kg)  SpO2: 97%    PHYSICAL EXAM General: NAD HEENT: Normal. Neck: No JVD, no thyromegaly. Lungs: Clear to auscultation bilaterally with normal respiratory effort. CV: Nondisplaced PMI. Bradycardic, regular rhythm, normal S1/S2, no S3/S4, soft 1/6 pansystolic murmur along left sternal border. +chest wall tenderness. No pretibial or periankle edema. No carotid bruit. Normal pedal pulses.  Abdomen: Soft, nontender, obese, no distention.  Neurologic: Alert and oriented x 3.  Psych: Normal affect. Skin: Normal. Musculoskeletal: No gross deformities. Extremities: No clubbing or cyanosis.   ECG: Most recent ECG  reviewed.      ASSESSMENT AND PLAN: 1. SVT: No further recurrences. No medication adjustments at this time.  2. Chest pain/CAD: Prior symptoms were atypical. Normal nuclear stress test in 12/2014 as noted above.  3. Hyperlipidemia: On Lipitor with good control on 06/23/14. No changes.  4. DVT in 04/2014: No longer on Xarelto. Has an IVC filter.  5. Bradycardia: Asymptomatic. May being driven by sleep apnea as well.  Dispo: Sees Dr. Ermalene Searing at [facility]. Can f/u with me in 1 year as per her preference.  Time spent: 40 minutes, of which greater than 50% was spent reviewing symptoms, relevant blood tests and studies, and discussing management plan with the patient.  Kate Sable, M.D., F.A.C.C.

## 2015-04-19 ENCOUNTER — Other Ambulatory Visit: Payer: Self-pay | Admitting: Family Medicine

## 2015-04-23 ENCOUNTER — Telehealth: Payer: Self-pay | Admitting: *Deleted

## 2015-04-23 ENCOUNTER — Ambulatory Visit: Payer: Medicaid Other | Admitting: Nurse Practitioner

## 2015-04-23 DIAGNOSIS — G5691 Unspecified mononeuropathy of right upper limb: Secondary | ICD-10-CM

## 2015-04-23 MED ORDER — FENTANYL 75 MCG/HR TD PT72: 75.0000 ug | MEDICATED_PATCH | TRANSDERMAL | Status: DC

## 2015-04-23 NOTE — Telephone Encounter (Signed)
Patient is requesting a refill on her fentanyl. She also needs a letter stating that she needs the vitamins she is on for social services.

## 2015-04-23 NOTE — Telephone Encounter (Signed)
Fentanyl rx and letter ready for pick up.

## 2015-04-23 NOTE — Telephone Encounter (Signed)
Patient aware that rx and letter are ready to be picked up.

## 2015-04-24 ENCOUNTER — Telehealth: Payer: Self-pay | Admitting: Nurse Practitioner

## 2015-04-24 NOTE — Telephone Encounter (Signed)
Patient notified that pharmacy notified that ok to fill rx

## 2015-04-24 NOTE — Telephone Encounter (Signed)
Called [facility] to verify last fill date and it was 03-23-15. Please advise

## 2015-04-24 NOTE — Telephone Encounter (Signed)
Spoke with pharmacy and told to refill- looking at records patient missed a month - ok to refill patch  Now.

## 2015-04-30 ENCOUNTER — Ambulatory Visit: Payer: Medicaid Other | Admitting: Nurse Practitioner

## 2015-05-03 ENCOUNTER — Ambulatory Visit (INDEPENDENT_AMBULATORY_CARE_PROVIDER_SITE_OTHER): Payer: Medicaid Other | Admitting: Nurse Practitioner

## 2015-05-03 ENCOUNTER — Other Ambulatory Visit: Payer: Self-pay | Admitting: Nurse Practitioner

## 2015-05-03 ENCOUNTER — Encounter: Payer: Self-pay | Admitting: Nurse Practitioner

## 2015-05-03 ENCOUNTER — Ambulatory Visit (INDEPENDENT_AMBULATORY_CARE_PROVIDER_SITE_OTHER): Payer: Medicaid Other

## 2015-05-03 VITALS — BP 116/60 | HR 77 | Temp 103.3°F | Ht 61.0 in | Wt 193.0 lb

## 2015-05-03 DIAGNOSIS — D509 Iron deficiency anemia, unspecified: Secondary | ICD-10-CM

## 2015-05-03 DIAGNOSIS — E876 Hypokalemia: Secondary | ICD-10-CM | POA: Diagnosis not present

## 2015-05-03 DIAGNOSIS — C8590 Non-Hodgkin lymphoma, unspecified, unspecified site: Secondary | ICD-10-CM

## 2015-05-03 DIAGNOSIS — R609 Edema, unspecified: Secondary | ICD-10-CM | POA: Diagnosis not present

## 2015-05-03 DIAGNOSIS — G5690 Unspecified mononeuropathy of unspecified upper limb: Secondary | ICD-10-CM

## 2015-05-03 DIAGNOSIS — R569 Unspecified convulsions: Secondary | ICD-10-CM | POA: Diagnosis not present

## 2015-05-03 DIAGNOSIS — R0989 Other specified symptoms and signs involving the circulatory and respiratory systems: Secondary | ICD-10-CM | POA: Diagnosis not present

## 2015-05-03 DIAGNOSIS — G5691 Unspecified mononeuropathy of right upper limb: Secondary | ICD-10-CM | POA: Diagnosis not present

## 2015-05-03 DIAGNOSIS — E785 Hyperlipidemia, unspecified: Secondary | ICD-10-CM | POA: Diagnosis not present

## 2015-05-03 DIAGNOSIS — J029 Acute pharyngitis, unspecified: Secondary | ICD-10-CM | POA: Diagnosis not present

## 2015-05-03 DIAGNOSIS — J101 Influenza due to other identified influenza virus with other respiratory manifestations: Secondary | ICD-10-CM

## 2015-05-03 DIAGNOSIS — I83009 Varicose veins of unspecified lower extremity with ulcer of unspecified site: Secondary | ICD-10-CM

## 2015-05-03 DIAGNOSIS — R600 Localized edema: Secondary | ICD-10-CM

## 2015-05-03 DIAGNOSIS — L97909 Non-pressure chronic ulcer of unspecified part of unspecified lower leg with unspecified severity: Secondary | ICD-10-CM

## 2015-05-03 DIAGNOSIS — F329 Major depressive disorder, single episode, unspecified: Secondary | ICD-10-CM | POA: Diagnosis not present

## 2015-05-03 DIAGNOSIS — F32A Depression, unspecified: Secondary | ICD-10-CM

## 2015-05-03 LAB — POCT INFLUENZA A/B
INFLUENZA A, POC: POSITIVE — AB
Influenza B, POC: NEGATIVE

## 2015-05-03 LAB — POCT RAPID STREP A (OFFICE): RAPID STREP A SCREEN: NEGATIVE

## 2015-05-03 MED ORDER — GABAPENTIN 300 MG PO CAPS: ORAL_CAPSULE | ORAL | Status: DC

## 2015-05-03 MED ORDER — FENTANYL 75 MCG/HR TD PT72: 75.0000 ug | MEDICATED_PATCH | TRANSDERMAL | Status: DC

## 2015-05-03 MED ORDER — OSELTAMIVIR PHOSPHATE 75 MG PO CAPS: 75.0000 mg | ORAL_CAPSULE | Freq: Two times a day (BID) | ORAL | Status: DC

## 2015-05-03 MED ORDER — LEVETIRACETAM 500 MG PO TABS: ORAL_TABLET | ORAL | Status: DC

## 2015-05-03 MED ORDER — INTEGRA 62.5-62.5-40-3 MG PO CAPS: ORAL_CAPSULE | ORAL | Status: DC

## 2015-05-03 MED ORDER — FLUOXETINE HCL 40 MG PO CAPS: 80.0000 mg | ORAL_CAPSULE | Freq: Every day | ORAL | Status: DC

## 2015-05-03 MED ORDER — FUROSEMIDE 40 MG PO TABS: ORAL_TABLET | ORAL | Status: DC

## 2015-05-03 MED ORDER — ATORVASTATIN CALCIUM 40 MG PO TABS: 40.0000 mg | ORAL_TABLET | Freq: Every day | ORAL | Status: DC

## 2015-05-03 MED ORDER — LEVOFLOXACIN 500 MG PO TABS: 500.0000 mg | ORAL_TABLET | Freq: Every day | ORAL | Status: DC

## 2015-05-03 NOTE — Progress Notes (Signed)
Subjective:    Patient ID: Angela Michael, female    DOB: Aug 22, 1960, 55 y.o.   MRN: [id number]   Patient here today for follow up of chronic medical problems.  Outpatient Encounter Prescriptions as of 05/03/2015  Medication Sig  . Ascorbic Acid (VITAMIN C) 1000 MG tablet Take 1,000 mg by mouth daily.   Marland Kitchen aspirin 81 MG tablet Take 81 mg by mouth daily.  Marland Kitchen atorvastatin (LIPITOR) 40 MG tablet Take 1 tablet (40 mg total) by mouth at bedtime.  . cyanocobalamin (,VITAMIN B-12,) 1000 MCG/ML injection INJECT 1 ML (1,000 MCG TOTAL) INTO THE MUSCLE EVERY 30 (THIRTY) DAYS.  . Fe Fum-FePoly-Vit C-Vit B3 (INTEGRA) 62.5-62.5-40-3 MG CAPS TAKE ONE CAPSULE BY MOUTH EVERY DAY  . fentaNYL (DURAGESIC - DOSED MCG/HR) 75 MCG/HR Place 1 patch (75 mcg total) onto the skin every 3 (three) days.  Marland Kitchen FLUoxetine (PROZAC) 40 MG capsule Take 2 capsules (80 mg total) by mouth daily.  . fluticasone (FLONASE) 50 MCG/ACT nasal spray PLACE 2 SPRAYS INTO THE NOSE AS NEEDED. FOR ALLERGIES  . furosemide (LASIX) 40 MG tablet TAKE 1 TABLET (40 MG TOTAL) BY MOUTH DAILY.  Marland Kitchen gabapentin (NEURONTIN) 300 MG capsule TAKE 2 CAPSULE BY MOUTH 2 TIMES DAILY  . levETIRAcetam (KEPPRA) 500 MG tablet TAKE 2 TABLETS BY MOUTH 2 TIMES DAILY  . loperamide (IMODIUM A-D) 2 MG tablet Take 1 tablet (2 mg total) by mouth 4 (four) times daily as needed for diarrhea or loose stools (over the counter).  Marland Kitchen loratadine (CLARITIN) 10 MG tablet Take 10 mg by mouth as needed. For allergies  . Multiple Vitamin (MULTIVITAMIN WITH MINERALS) TABS Take 1 tablet by mouth daily.  . nitroGLYCERIN (NITROSTAT) 0.4 MG SL tablet Place 1 tablet (0.4 mg total) under the tongue every 5 (five) minutes as needed for chest pain.  . Nutritional Supplements (ESTROVEN PMS) TABS Take 1 tablet by mouth daily.   . traZODone (DESYREL) 50 MG tablet Take 150 mg by mouth at bedtime.    No facility-administered encounter medications on file as of 05/03/2015.   * C/O Cough started last  week- horace voice- sore throat- fever 103.3- achy all over.   Hyperlipidemia This is a chronic problem. The current episode started more than 1 year ago. The problem is uncontrolled. Recent lipid tests were reviewed and are high. Current antihyperlipidemic treatment includes statins. The current treatment provides moderate improvement of lipids. Compliance problems include adherence to diet and adherence to exercise.  Risk factors for coronary artery disease include post-menopausal, dyslipidemia and obesity.  Seizures Keppra working well no seizure in several years. Peripheral edema Lasix working well to keep edema under control. Hypokalemia K-Dur working well. No C/O lower ext aches Chronic right arm pain and neuropathy Duragesic patches makes pain tolerable- neurotin helps a lot Depression Prozac working well- has occassional bouts of depression but nothing she can't handle Hx lymphoma Currently on remission- has completed all of her treatments SVT No recent episodes- sees cardiologist yearly and as needed.   Review of Systems  HENT: Negative.   Respiratory: Negative for choking and chest tightness.   Gastrointestinal: Negative.   Endocrine: Negative.   Genitourinary: Negative.   Musculoskeletal: Negative.   Allergic/Immunologic: Negative.   Neurological: Negative.   Psychiatric/Behavioral: Negative.   All other systems reviewed and are negative.      Objective:   Physical Exam  Constitutional: She is oriented to person, place, and time. She appears well-developed and well-nourished.  HENT:  Right Ear: Hearing, tympanic  membrane, external ear and ear canal normal.  Left Ear: Hearing, tympanic membrane, external ear and ear canal normal.  Nose: Mucosal edema and rhinorrhea present. Right sinus exhibits maxillary sinus tenderness. Left sinus exhibits maxillary sinus tenderness.  Mouth/Throat: Uvula is midline, oropharynx is clear and moist and mucous membranes are normal.   Eyes: EOM are normal.  Neck: Trachea normal, normal range of motion and full passive range of motion without pain. Neck supple. No JVD present. Carotid bruit is not present. No thyromegaly present.  Cardiovascular: Normal rate, normal heart sounds and intact distal pulses.  Exam reveals no gallop and no friction rub.   No murmur heard. Regular irregularity  Pulmonary/Chest: Effort normal and breath sounds normal.  Deep wet cough  Abdominal: Soft. Bowel sounds are normal. She exhibits no distension and no mass. There is no tenderness.  Musculoskeletal: Normal range of motion.  Improving ROM of left knee- scar healing without problems  Lymphadenopathy:    She has no cervical adenopathy.  Neurological: She is alert and oriented to person, place, and time. She has normal reflexes.  Skin: Skin is warm and dry.  Face flushed  Psychiatric: She has a normal mood and affect. Her behavior is normal. Judgment and thought content normal.     BP 116/60 mmHg  Pulse 77  Temp(Src) 103.3 F (39.6 C) (Oral)  Ht 5' 1"  (1.549 m)  Wt 193 lb (87.544 kg)  BMI 36.49 kg/m2  Results for orders placed or performed in visit on 05/03/15  POCT rapid strep A  Result Value Ref Range   Rapid Strep A Screen Negative Negative  POCT Influenza A/B  Result Value Ref Range   Influenza A, POC Positive (A) Negative   Influenza B, POC Negative Negative    Chest x ray- no acute pulmonary problems     Assessment & Plan:   1. Sore throat - POCT rapid strep A  2. Chest congestion - POCT Influenza A/B - DG Chest 2 View; Future  3. Influenza A (H1N1) 1. Take meds as prescribed 2. Use a cool mist humidifier especially during the winter months and when heat has been humid. 3. Use saline nose sprays frequently 4. Saline irrigations of the nose can be very helpful if done frequently.  * 4X daily for 1 week*  * Use of a nettie pot can be helpful with this. Follow directions with this* 5. Drink plenty of  fluids 6. Keep thermostat turn down low 7.For any cough or congestion  Use plain Mucinex- regular strength or max strength is fine   * Children- consult with Pharmacist for dosing 8. For fever or aces or pains- take tylenol or ibuprofen appropriate for age and weight.  * for fevers greater than 101 orally you may alternate ibuprofen and tylenol every  3 hours.   - oseltamivir (TAMIFLU) 75 MG capsule; Take 1 capsule (75 mg total) by mouth 2 (two) times daily.  Dispense: 10 capsule; Refill: 0  4. Neuropathy of upper extremity, right Discussed pain management changes - fentaNYL (DURAGESIC - DOSED MCG/HR) 75 MCG/HR; Place 1 patch (75 mcg total) onto the skin every 3 (three) days.  Dispense: 10 patch; Refill: 0 - fentaNYL (DURAGESIC) 75 MCG/HR; Place 1 patch (75 mcg total) onto the skin every 3 (three) days.  Dispense: 5 patch; Refill: 0 - fentaNYL (DURAGESIC) 75 MCG/HR; Place 1 patch (75 mcg total) onto the skin every 3 (three) days.  Dispense: 5 patch; Refill: 0  5. Depression Stress management -  FLUoxetine (PROZAC) 40 MG capsule; Take 2 capsules (80 mg total) by mouth daily.  Dispense: 60 capsule; Refill: 5  6. Peripheral edema Elevate legs when sitting - furosemide (LASIX) 40 MG tablet; TAKE 1 TABLET (40 MG TOTAL) BY MOUTH DAILY.  Dispense: 30 tablet; Refill: 5  7. Hyperlipidemia with target LDL less than 100 Low fat diet - atorvastatin (LIPITOR) 40 MG tablet; Take 1 tablet (40 mg total) by mouth at bedtime.  Dispense: 30 tablet; Refill: 5 - CMP14+EGFR - Lipid panel  8. Hypokalemia  9. Seizures ([facility]) - levETIRAcetam (KEPPRA) 500 MG tablet; TAKE 2 TABLETS BY MOUTH 2 TIMES DAILY  Dispense: 120 tablet; Refill: 5   10. Non-Hodgkin's lymphoma, unspecified body region, unspecified non-Hodgkin lymphoma type ([place])  11. Morbid obesity due to excess calories ([place]) Discussed diet and exercise for person with BMI >25 Will recheck weight in 3-6 months   12. Iron deficiency anemia - Fe  Fum-FePoly-Vit C-Vit B3 (INTEGRA) 62.5-62.5-40-3 MG CAPS; TAKE ONE CAPSULE BY MOUTH EVERY DAY  Dispense: 30 capsule; Refill: 5  13. Neuropathy of upper extremity, unspecified laterality - gabapentin (NEURONTIN) 300 MG capsule; TAKE 2 CAPSULE BY MOUTH 2 TIMES DAILY  Dispense: 120 capsule; Refill: 5  14. Varicose veins of lower extremities with ulcer, unspecified laterality ([place])  Labs pending Health maintenance reviewed Diet and exercise encouraged Continue all meds Follow up  In 3 months   [employer], FNP

## 2015-05-04 LAB — LIPID PANEL
CHOL/HDL RATIO: 2.1 ratio (ref 0.0–4.4)
Cholesterol, Total: 105 mg/dL (ref 100–199)
HDL: 50 mg/dL (ref >39)
LDL Calculated: 37 mg/dL (ref 0–99)
Triglycerides: 88 mg/dL (ref 0–149)
VLDL Cholesterol Cal: 18 mg/dL (ref 5–40)

## 2015-05-04 LAB — CMP14+EGFR
ALK PHOS: 107 IU/L (ref 39–117)
ALT: 45 IU/L — AB (ref 0–32)
AST: 39 IU/L (ref 0–40)
Albumin/Globulin Ratio: 1.6 (ref 1.1–2.5)
Albumin: 3.7 g/dL (ref 3.5–5.5)
BUN/Creatinine Ratio: 15 (ref 9–23)
BUN: 14 mg/dL (ref 6–24)
Bilirubin Total: 0.8 mg/dL (ref 0.0–1.2)
CO2: 21 mmol/L (ref 18–29)
CREATININE: 0.94 mg/dL (ref 0.57–1.00)
Calcium: 8.5 mg/dL — ABNORMAL LOW (ref 8.7–10.2)
Chloride: 99 mmol/L (ref 96–106)
GFR calc Af Amer: 81 mL/min/{1.73_m2} (ref >59)
GFR calc non Af Amer: 70 mL/min/{1.73_m2} (ref >59)
GLUCOSE: 137 mg/dL — AB (ref 65–99)
Globulin, Total: 2.3 g/dL (ref 1.5–4.5)
POTASSIUM: 3.4 mmol/L — AB (ref 3.5–5.2)
SODIUM: 139 mmol/L (ref 134–144)
Total Protein: 6 g/dL (ref 6.0–8.5)

## 2015-05-08 ENCOUNTER — Other Ambulatory Visit: Payer: Self-pay | Admitting: Nurse Practitioner

## 2015-06-04 HISTORY — PX: MEDIAL PARTIAL KNEE REPLACEMENT: SHX5965

## 2015-06-08 ENCOUNTER — Ambulatory Visit (INDEPENDENT_AMBULATORY_CARE_PROVIDER_SITE_OTHER): Payer: Medicaid Other | Admitting: Nurse Practitioner

## 2015-06-08 ENCOUNTER — Ambulatory Visit: Payer: Medicaid Other | Attending: Physician Assistant | Admitting: Physical Therapy

## 2015-06-08 ENCOUNTER — Telehealth: Payer: Self-pay | Admitting: Nurse Practitioner

## 2015-06-08 ENCOUNTER — Encounter: Payer: Self-pay | Admitting: Nurse Practitioner

## 2015-06-08 VITALS — BP 116/72 | HR 75 | Temp 98.6°F

## 2015-06-08 DIAGNOSIS — R5381 Other malaise: Secondary | ICD-10-CM | POA: Diagnosis present

## 2015-06-08 DIAGNOSIS — Z7901 Long term (current) use of anticoagulants: Secondary | ICD-10-CM | POA: Diagnosis not present

## 2015-06-08 DIAGNOSIS — M25562 Pain in left knee: Secondary | ICD-10-CM | POA: Insufficient documentation

## 2015-06-08 DIAGNOSIS — M25462 Effusion, left knee: Secondary | ICD-10-CM | POA: Insufficient documentation

## 2015-06-08 DIAGNOSIS — M24662 Ankylosis, left knee: Secondary | ICD-10-CM | POA: Diagnosis present

## 2015-06-08 DIAGNOSIS — Z5181 Encounter for therapeutic drug level monitoring: Secondary | ICD-10-CM | POA: Diagnosis not present

## 2015-06-08 LAB — PROTIME-INR: INR: 1.1 (ref <=1.1)

## 2015-06-08 NOTE — Telephone Encounter (Signed)
Patient seen today in office for surgical follow up

## 2015-06-08 NOTE — Progress Notes (Signed)
   Subjective:    Patient ID: Angela Michael, female    DOB: [birth year], 55 y.o.   MRN: [id number]  HPI Patient had a total knee replacement on Monday February 20,2017. SHe was discharged from hospital on Wednesday February 22,2017. SHe is doing well and getting physical therapy 3x a week. SHe was started on coumadin and her INR is suppose to be from 1.8-2.2- Here today to have INR rechecked.    Review of Systems  Constitutional: Negative.   HENT: Negative.   Respiratory: Negative.   Cardiovascular: Negative.   Genitourinary: Negative.   Neurological: Negative.   Psychiatric/Behavioral: Negative.   All other systems reviewed and are negative.      Objective:   Physical Exam  Constitutional: She is oriented to person, place, and time. She appears well-developed and well-nourished.  Cardiovascular: Normal rate, regular rhythm and normal heart sounds.   Pulmonary/Chest: Effort normal and breath sounds normal.  Neurological: She is alert and oriented to person, place, and time.  Skin: Skin is warm.  Psychiatric: She has a normal mood and affect. Her behavior is normal. Judgment and thought content normal.    BP 116/72 mmHg  Pulse 75  Temp(Src) 98.6 F (37 C) (Oral)  Wt         Assessment & Plan:  Anticoagulation goal of INR 1.5 to 2.5  Continue 4 mg daily Continue lovenox injections and recheck next Wednesday Watch for any bleeding or excessive bruising  Mary-Margaret Hassell Done, FNP

## 2015-06-08 NOTE — Therapy (Signed)
[facility] [address], [place], [address] Phone: [phone]   Fax:  [phone]  Physical Therapy Evaluation  Patient Details  Name: Angela Michael MRN: [id number] Date of Birth: 1960/08/04 Referring Provider: Kerry Dory PA-C  Encounter Date: 06/08/2015      PT End of Session - 06/08/15 1047    Visit Number 1   Number of Visits 9   Date for PT Re-Evaluation 07/20/15   PT Start Time 0949   PT Stop Time 1049   PT Time Calculation (min) 60 min      Past Medical History  Diagnosis Date  . History of syncope   . Depression   . PVC's (premature ventricular contractions)   . Chronic pain   . Chronic back pain   . Chronic neck pain   . Peripheral edema   . Hyperlipidemia   . Anemia   . Neuropathy ([place])   . SVT (supraventricular tachycardia) ([place])   . Diabetes mellitus without complication ([place]) Q000111Q    patient denies being diabetic, no meds  . Lymphoma ([place])   . Seizures [facility])     Past Surgical History  Procedure Laterality Date  . Gastric bypass      Says she had lost 345 lbs  . Venous thrombectomy      Blood clot resection from right arm  . Lymph node dissection Left     Resected  . Skin surgery      Removal of excess skin  . Vena cava filter placement    . Lymph node removed from stomach    . Cholecystectomy    . Cataract extraction Right   . Endovenous ablation saphenous vein w/ laser Right 09-08-2013    EVLA right small saphenous vein and stab phlebectomy 10-20 incisions right leg by Curt Jews MD  . Medial partial knee replacement  05/10/14    left knee    There were no vitals filed for this visit.  Visit Diagnosis:  Left knee pain - Plan: PT plan of care cert/re-cert  Decreased range of motion of knee, left - Plan: PT plan of care cert/re-cert  Knee swelling, left - Plan: PT plan of care cert/re-cert  Debility - Plan: PT plan of care cert/re-cert      Subjective Assessment -  06/08/15 1014    Subjective Been doing all the home exercises they showed me in the hospital.   Limitations Walking   Patient Stated Goals Get out of pain.   Currently in Pain? Yes   Pain Score 7    Pain Location Knee   Pain Orientation Left   Pain Descriptors / Indicators Aching   Pain Onset In the past 7 days   Pain Frequency Constant   Aggravating Factors  Over doing things.   Pain Relieving Factors Pain medication.            [facility] PT Assessment - 06/08/15 0001    Assessment   Medical Diagnosis Left total knee replacment.   Referring Provider Kerry Dory PA-C   Onset Date/Surgical Date --  06/04/15 (surgery date).   Precautions   Precautions --  No ultrasound.   Restrictions   Weight Bearing Restrictions No   Balance Screen   Has the patient fallen in the past 6 months No   Has the patient had a decrease in activity level because of a fear of falling?  Yes   Is the patient reluctant to leave their home because of a fear of  falling?  No   Home Environment   Living Environment Private residence   Observation/Other Assessments   Observations Post-operative dressing on and TED hose donned.   Observation/Other Assessments-Edema    Edema Circumferential   Circumferential Edema   Circumferential - Left  Left 9 cms > right mid-patellar region.   ROM / Strength   AROM / PROM / Strength AROM;Strength   AROM   Overall AROM Comments -10 degrees left knee in supine and -6 passively with flexion to 70 degrees.   Strength   Overall Strength Comments Left hip and knee strength= 2 to 2+/5.   Palpation   Palpation comment Tender to palpation diffusely around the patient's left anterior knee.   Ambulation/Gait   Gait Comments Antalgic gait with a FWW.                   [facility] Adult PT Treatment/Exercise - 06/08/15 0001    Exercises   Exercises Knee/Hip   Knee/Hip Exercises: Aerobic   Nustep Level 3 x 15 minutes.   Modalities   Modalities Vasopneumatic    Vasopneumatic   Number Minutes Vasopneumatic  15 minutes   Vasopnuematic Location  --  left knee.   Vasopneumatic Pressure Medium                     PT Long Term Goals - 06/08/15 1027    PT LONG TERM GOAL #1   Title Independent with a HEP.   Baseline No knowledge of appropriate ther ex.   Time 3   Period Weeks   Status New   PT LONG TERM GOAL #2   Title Decrease edema to within 3 cms of contralateral side to assist with range of motion gains and decrease pain.   Baseline Left is 9 cms > left.   Time 3   Period Weeks   Status New   PT LONG TERM GOAL #3   Title Full active left knee extension in order to normalize gait   Time 3   Period Weeks   Status New   PT LONG TERM GOAL #4   Title Active left knee flexion to 115 degrees+ so the patient can perform functional tasks and do so with pain not > 2-3/10.   Baseline 70 degrees of left knee flexion.   Time 3   Period Weeks   Status New   PT LONG TERM GOAL #5   Title Increase left hip and knee strength to a solid 4+/5 to provide good stability for accomplishment of functional activities   Baseline 2 to 2+/5 strength grade.   Time 3   Period Weeks   Status New   Additional Long Term Goals   Additional Long Term Goals Yes   PT LONG TERM GOAL #6   Title Walk unassisted a community distance without an assistive device.   Baseline patient requires the use of a FWW currently.   Time 3   Period Weeks   Status New               Plan - 06/08/15 1017    Clinical Impression Statement The patient underwent a left total knee replacement on 06/04/15.  She is wearing TED hose today and post-surgical dressing is intact.  Per patient report she  has developed a blister in her left inner thigh and is keeping a bandaid on it.  She has been doing the home exercises she was shown in the hospital and it was recommended  she continue them at this time.  Her pain-level is a 7/10.  Overdoing it will increase her pain to higher  levels but pain medications reduce her pain.  She is currently ambulating with a FWW.   Pt will benefit from skilled therapeutic intervention in order to improve on the following deficits Abnormal gait;Decreased activity tolerance;Decreased mobility;Decreased strength;Increased edema;Decreased range of motion;Pain   Rehab Potential Excellent   PT Frequency 2x / week   PT Duration 3 weeks   PT Treatment/Interventions ADLs/Self Care Home Management;Cryotherapy;Electrical Stimulation;Therapeutic exercise;Therapeutic activities;Manual techniques;Vasopneumatic Device   PT Next Visit Plan Total knee replacement protocol.  Vasopneumatic.   Consulted and Agree with Plan of Care Patient         Problem List Patient Active Problem List   Diagnosis Date Noted  . Presence of vena cava filter   . Pancytopenia ([place])   . SIRS (systemic inflammatory response syndrome) ([place]) 07/31/2014  . Lymphoma ([place])   . Seizures ([place]) 12/05/2013  . Heme positive stool 06/22/2013  . Melena 06/22/2013  . Varicose veins of lower extremities with other complications 123XX123  . Chest pain 11/12/2012  . Morbid obesity ([place]) 11/12/2012  . SVT (supraventricular tachycardia) ([place])   . Peripheral edema 08/06/2012  . Hyperlipidemia with target LDL less than 100 08/06/2012  . Neuropathy of upper extremity 08/06/2012  . Anemia 08/06/2012  . Hypokalemia 08/06/2012  . STRESS ELECTROCARDIOGRAM, ABNORMAL 01/31/2009  . Depression 11/04/2008  . SYNCOPE, HX OF 11/04/2008    Jennesis Ramaswamy, [place] MPT 06/08/2015, 10:52 AM  [facility] [address] [place], [place], [address] Phone: [phone]   Fax:  [phone]  Name: Angela Michael MRN: [id number] Date of Birth: 23-May-1960

## 2015-06-08 NOTE — Patient Instructions (Signed)
Prothrombin Time, International Normalized Ratio Test WHY AM I HAVING THIS TEST? A prothrombin time (Pro-Time, PT) test measures how many seconds it takes your blood to clot. The international normalized ratio (INR) is a calculation of blood clotting time based on your PT result. Most labs report both PT and INR values when reporting blood clotting times. Your health care provider may want you to have this test done if:  You have certain medical conditions that cause abnormal bleeding or blood clotting. These can include:  Liver disease.  Systemic infection (sepsis).  Inherited (genetic) bleeding disorders.  You are taking a medicine to prevent excessive blood clotting (anticoagulant), such as warfarin.  If you are taking warfarin, you will likely be asked to have this test done at regular intervals. The results of this test will help your health care provider determine what dose of warfarin you need based on how quickly or slowly your blood clots. It is very important to have this test done as often as your health care provider recommends. WHAT KIND OF SAMPLE IS TAKEN? A blood sample is required for this test. It is usually collected by inserting a needle into a vein or by sticking a finger with a small needle. HOW DO I PREPARE FOR THE TEST? There is no preparation required for this test. WHAT ARE THE REFERENCE INTERVALS? Reference intervalsare considered healthy intervalsestablished after testing a large group of healthy people. Reference intervals may vary among different people, labs, and hospitals. It is your responsibility to obtain your test results. Ask the lab or department performing the test when and how you will get your results. Reference intervals for this test are as follows:  Without anticoagulant treatment (control value): 11.0-12.5 seconds; 85-100%.  With full anticoagulant treatment: greater than 1.5-2 times the control value; 20-30%.  INR: 0.8-1.1. WHAT DO THE  RESULTS MEAN? There are several factors that can alter your PT and INR test results. It is important for you to know that:  PT and INR results can be affected by certain foods you eat, especially foods that contain moderate or high amounts of vitamin K. It is important to eat a consistent amount of vitamin K-rich food. Let your health care provider know if you have recently changed your diet.  PT and INR results can be affected by some medicines. Do not stop, add, or change any medicines without letting your health care provider know. Talk with your health care provider to discuss your results, treatment options, and if necessary, the need for more tests. Talk with your health care provider if you have any questions about your results.   This information is not intended to replace advice given to you by your health care provider. Make sure you discuss any questions you have with your health care provider.   Document Released: 05/03/2004 Document Revised: 04/21/2014 Document Reviewed: 08/24/2013 Elsevier Interactive Patient Education [employer].

## 2015-06-13 ENCOUNTER — Ambulatory Visit: Payer: Medicaid Other | Attending: Physician Assistant | Admitting: Physical Therapy

## 2015-06-13 DIAGNOSIS — R5381 Other malaise: Secondary | ICD-10-CM | POA: Diagnosis present

## 2015-06-13 DIAGNOSIS — M24662 Ankylosis, left knee: Secondary | ICD-10-CM | POA: Diagnosis present

## 2015-06-13 DIAGNOSIS — M25462 Effusion, left knee: Secondary | ICD-10-CM | POA: Diagnosis present

## 2015-06-13 DIAGNOSIS — M25562 Pain in left knee: Secondary | ICD-10-CM | POA: Diagnosis present

## 2015-06-13 NOTE — Therapy (Signed)
[facility] [place], [place], [address] Phone: [phone]   Fax:  [phone]  Physical Therapy Treatment  Patient Details  Name: Angela Michael MRN: [id number] Date of Birth: [birth year] Referring Provider: Kerry Dory PA-C  Encounter Date: 06/13/2015      PT End of Session - 06/13/15 1336    PT Start Time 0105   PT Stop Time 0147   PT Time Calculation (min) 42 min   Activity Tolerance --  Patient did not tolerate treatment well due to high pain-level as she did not take medication prior to her session today.   Behavior During Therapy --  Patient concerned about pain.      Past Medical History  Diagnosis Date  . History of syncope   . Depression   . PVC's (premature ventricular contractions)   . Chronic pain   . Chronic back pain   . Chronic neck pain   . Peripheral edema   . Hyperlipidemia   . Anemia   . Neuropathy ([place])   . SVT (supraventricular tachycardia) ([place])   . Diabetes mellitus without complication ([place]) Q000111Q    patient denies being diabetic, no meds  . Lymphoma ([place])   . Seizures [facility])     Past Surgical History  Procedure Laterality Date  . Gastric bypass      Says she had lost 345 lbs  . Venous thrombectomy      Blood clot resection from right arm  . Lymph node dissection Left     Resected  . Skin surgery      Removal of excess skin  . Vena cava filter placement    . Lymph node removed from stomach    . Cholecystectomy    . Cataract extraction Right   . Endovenous ablation saphenous vein w/ laser Right 09-08-2013    EVLA right small saphenous vein and stab phlebectomy 10-20 incisions right leg by Curt Jews MD  . Medial partial knee replacement  05/10/14    left knee  . Medial partial knee replacement Left 06/04/2015    [facility]    There were no vitals filed for this visit.  Visit Diagnosis:  Left knee pain  Decreased range of motion of knee, left  Knee swelling,  left  Debility      Subjective Assessment - 06/13/15 1308    Subjective Pain is at a 7/10 today.  I have a low pain tolerance and didn't take my pain medication before therapy today.   Limitations Walking   Patient Stated Goals Get out of pain.   Pain Score 7    Pain Location Knee   Pain Orientation Left   Pain Descriptors / Indicators Aching   Pain Onset 1 to 4 weeks ago   Pain Frequency Constant                         [facility] Adult PT Treatment/Exercise - 06/13/15 0001    Exercises   Exercises Knee/Hip;Ankle   Knee/Hip Exercises: Aerobic   Nustep Level 3 moving forward x 2 to increase flexion x 15 minute.   Vasopneumatic   Number Minutes Vasopneumatic  13 minutes   Vasopnuematic Location  --  Left knee.   Vasopneumatic Pressure --  Medium.   Manual Therapy   Manual Therapy Passive ROM   Manual therapy comments PROM into left knee flexion and extension x 5 minutes.   Ankle Exercises: Standing   Rocker Board Limitations 3  minutes.                     PT Long Term Goals - 06/08/15 1027    PT LONG TERM GOAL #1   Title Independent with a HEP.   Baseline No knowledge of appropriate ther ex.   Time 3   Period Weeks   Status New   PT LONG TERM GOAL #2   Title Decrease edema to within 3 cms of contralateral side to assist with range of motion gains and decrease pain.   Baseline Left is 9 cms > left.   Time 3   Period Weeks   Status New   PT LONG TERM GOAL #3   Title Full active left knee extension in order to normalize gait   Time 3   Period Weeks   Status New   PT LONG TERM GOAL #4   Title Active left knee flexion to 115 degrees+ so the patient can perform functional tasks and do so with pain not > 2-3/10.   Baseline 70 degrees of left knee flexion.   Time 3   Period Weeks   Status New   PT LONG TERM GOAL #5   Title Increase left hip and knee strength to a solid 4+/5 to provide good stability for accomplishment of functional  activities   Baseline 2 to 2+/5 strength grade.   Time 3   Period Weeks   Status New   Additional Long Term Goals   Additional Long Term Goals Yes   PT LONG TERM GOAL #6   Title Walk unassisted a community distance without an assistive device.   Baseline patient requires the use of a FWW currently.   Time 3   Period Weeks   Status New               Problem List Patient Active Problem List   Diagnosis Date Noted  . Presence of vena cava filter   . Pancytopenia ([place])   . SIRS (systemic inflammatory response syndrome) ([place]) 07/31/2014  . Lymphoma ([place])   . Seizures ([place]) 12/05/2013  . Heme positive stool 06/22/2013  . Melena 06/22/2013  . Varicose veins of lower extremities with other complications 123XX123  . Chest pain 11/12/2012  . Morbid obesity ([place]) 11/12/2012  . SVT (supraventricular tachycardia) ([place])   . Peripheral edema 08/06/2012  . Hyperlipidemia with target LDL less than 100 08/06/2012  . Neuropathy of upper extremity 08/06/2012  . Anemia 08/06/2012  . Hypokalemia 08/06/2012  . STRESS ELECTROCARDIOGRAM, ABNORMAL 01/31/2009  . Depression 11/04/2008  . SYNCOPE, HX OF 11/04/2008    Angela Michael, [place] MPT 06/13/2015, 1:47 PM  [facility] [address] [place], [place], [address] Phone: [phone]   Fax:  [phone]  Name: Angela Michael MRN: [id number] Date of Birth: 1960-08-15

## 2015-06-15 ENCOUNTER — Ambulatory Visit: Payer: Medicaid Other | Admitting: Physical Therapy

## 2015-06-15 DIAGNOSIS — M24662 Ankylosis, left knee: Secondary | ICD-10-CM

## 2015-06-15 DIAGNOSIS — M25562 Pain in left knee: Secondary | ICD-10-CM

## 2015-06-15 DIAGNOSIS — M25462 Effusion, left knee: Secondary | ICD-10-CM

## 2015-06-15 NOTE — Therapy (Signed)
[facility] [place], [place], [address] Phone: [phone]   Fax:  [phone]  Physical Therapy Treatment  Patient Details  Name: Angela Michael MRN: [id number] Date of Birth: 13-Nov-1960 Referring Provider: Kerry Dory PA-C  Encounter Date: 06/15/2015      PT End of Session - 06/15/15 0955    Visit Number 2   Number of Visits 9   Date for PT Re-Evaluation 07/20/15   PT Start Time 0952   PT Stop Time 1044   PT Time Calculation (min) 52 min   Activity Tolerance Patient tolerated treatment well   Behavior During Therapy [facility] for tasks assessed/performed      Past Medical History  Diagnosis Date  . History of syncope   . Depression   . PVC's (premature ventricular contractions)   . Chronic pain   . Chronic back pain   . Chronic neck pain   . Peripheral edema   . Hyperlipidemia   . Anemia   . Neuropathy ([place])   . SVT (supraventricular tachycardia) ([place])   . Diabetes mellitus without complication ([place]) Q000111Q    patient denies being diabetic, no meds  . Lymphoma ([place])   . Seizures [facility])     Past Surgical History  Procedure Laterality Date  . Gastric bypass      Says she had lost 345 lbs  . Venous thrombectomy      Blood clot resection from right arm  . Lymph node dissection Left     Resected  . Skin surgery      Removal of excess skin  . Vena cava filter placement    . Lymph node removed from stomach    . Cholecystectomy    . Cataract extraction Right   . Endovenous ablation saphenous vein w/ laser Right 09-08-2013    EVLA right small saphenous vein and stab phlebectomy 10-20 incisions right leg by Curt Jews MD  . Medial partial knee replacement  05/10/14    left knee  . Medial partial knee replacement Left 06/04/2015    [facility]    There were no vitals filed for this visit.  Visit Diagnosis:  Left knee pain  Decreased range of motion of knee, left  Knee swelling, left      Subjective  Assessment - 06/15/15 1004    Subjective Patient states she took pain meds today prior to therapy.   Limitations Walking   Patient Stated Goals Get out of pain.   Currently in Pain? Yes   Pain Score 6    Pain Location Knee   Pain Orientation Left   Pain Descriptors / Indicators Aching   Pain Type Surgical pain                         [facility] Adult PT Treatment/Exercise - 06/15/15 0001    Knee/Hip Exercises: Aerobic   Stationary Bike partial revolutions x 10 min   Nustep L 4 x 8 min moving seat forward to #6 for ROM   Knee/Hip Exercises: Standing   Forward Lunges 10 reps  10 seconds   Knee/Hip Exercises: Seated   Long Arc Quad Strengthening;Left;5 reps   Knee/Hip Exercises: Supine   Quad Sets Strengthening;Left;1 set;5 reps   Modalities   Modalities Vasopneumatic   Vasopneumatic   Number Minutes Vasopneumatic  15 minutes   Vasopnuematic Location  Knee   Vasopneumatic Pressure Medium   Manual Therapy   Manual Therapy Passive ROM;Soft tissue mobilization  Soft tissue mobilization to post knee   Passive ROM into flex with slide board assist; and into ext with prolonged hold                     PT Long Term Goals - 06/08/15 1027    PT LONG TERM GOAL #1   Title Independent with a HEP.   Baseline No knowledge of appropriate ther ex.   Time 3   Period Weeks   Status New   PT LONG TERM GOAL #2   Title Decrease edema to within 3 cms of contralateral side to assist with range of motion gains and decrease pain.   Baseline Left is 9 cms > left.   Time 3   Period Weeks   Status New   PT LONG TERM GOAL #3   Title Full active left knee extension in order to normalize gait   Time 3   Period Weeks   Status New   PT LONG TERM GOAL #4   Title Active left knee flexion to 115 degrees+ so the patient can perform functional tasks and do so with pain not > 2-3/10.   Baseline 70 degrees of left knee flexion.   Time 3   Period Weeks   Status New   PT LONG  TERM GOAL #5   Title Increase left hip and knee strength to a solid 4+/5 to provide good stability for accomplishment of functional activities   Baseline 2 to 2+/5 strength grade.   Time 3   Period Weeks   Status New   Additional Long Term Goals   Additional Long Term Goals Yes   PT LONG TERM GOAL #6   Title Walk unassisted a community distance without an assistive device.   Baseline patient requires the use of a FWW currently.   Time 3   Period Weeks   Status New               Plan - 06/15/15 1237    Clinical Impression Statement Patient tolerated TE well today. She c/o pain in post L knee and responded well to STW. Patient unable to complete full revolution on bike at this time. Goals are ongoing.   Pt will benefit from skilled therapeutic intervention in order to improve on the following deficits Abnormal gait;Decreased activity tolerance;Decreased mobility;Decreased strength;Increased edema;Decreased range of motion;Pain   Rehab Potential Excellent   PT Frequency 2x / week   PT Duration 3 weeks   PT Treatment/Interventions ADLs/Self Care Home Management;Cryotherapy;Electrical Stimulation;Therapeutic exercise;Therapeutic activities;Manual techniques;Vasopneumatic Device   PT Next Visit Plan Total knee replacement protocol.  Vasopneumatic.   Consulted and Agree with Plan of Care Patient        Problem List Patient Active Problem List   Diagnosis Date Noted  . Presence of vena cava filter   . Pancytopenia ([place])   . SIRS (systemic inflammatory response syndrome) ([place]) 07/31/2014  . Lymphoma ([place])   . Seizures ([place]) 12/05/2013  . Heme positive stool 06/22/2013  . Melena 06/22/2013  . Varicose veins of lower extremities with other complications [id number]  . Chest pain 11/12/2012  . Morbid obesity ([place]) 11/12/2012  . SVT (supraventricular tachycardia) ([place])   . Peripheral edema 08/06/2012  . Hyperlipidemia with target LDL less than 100 08/06/2012  . Neuropathy of  upper extremity 08/06/2012  . Anemia 08/06/2012  . Hypokalemia 08/06/2012  . STRESS ELECTROCARDIOGRAM, ABNORMAL 01/31/2009  . Depression 11/04/2008  . SYNCOPE, HX OF 11/04/2008    Almyra Free  Holland Kotter PT  06/15/2015, 12:40 PM  [facility] [place], [place], [address] Phone: [phone]   Fax:  [phone]  Name: Albree Rusak MRN: [id number] Date of Birth: [birth year]

## 2015-06-20 ENCOUNTER — Encounter: Payer: Medicaid Other | Admitting: Physical Therapy

## 2015-06-21 ENCOUNTER — Ambulatory Visit (INDEPENDENT_AMBULATORY_CARE_PROVIDER_SITE_OTHER): Payer: Medicaid Other | Admitting: Pharmacist

## 2015-06-21 DIAGNOSIS — Z7901 Long term (current) use of anticoagulants: Secondary | ICD-10-CM | POA: Diagnosis not present

## 2015-06-21 DIAGNOSIS — Z299 Encounter for prophylactic measures, unspecified: Secondary | ICD-10-CM

## 2015-06-21 LAB — COAGUCHEK XS/INR WAIVED
INR: 1 (ref 0.9–1.1)
Prothrombin Time: 11.8 s

## 2015-06-21 MED ORDER — ENOXAPARIN SODIUM 40 MG/0.4ML ~~LOC~~ SOLN: 40.0000 mg | SUBCUTANEOUS | Status: DC

## 2015-06-21 MED ORDER — CYANOCOBALAMIN 1000 MCG/ML IJ SOLN: 1000.0000 ug | INTRAMUSCULAR | Status: DC

## 2015-06-21 NOTE — Patient Instructions (Signed)
Anticoagulation Dose Instructions as of 06/21/2015      Angela Michael Tue Wed Thu Fri Sat   New Dose 6 mg 6 mg 6 mg 6 mg 6 mg 6 mg 6 mg    Description        Take 3 tablet (=6mg ) daily until return for check on Monday, March 13th.     INR was 1.0 today (goal is 1.8 to 2.2)

## 2015-06-22 ENCOUNTER — Ambulatory Visit: Payer: Medicaid Other | Admitting: Physical Therapy

## 2015-06-22 DIAGNOSIS — M24662 Ankylosis, left knee: Secondary | ICD-10-CM

## 2015-06-22 DIAGNOSIS — M25462 Effusion, left knee: Secondary | ICD-10-CM

## 2015-06-22 DIAGNOSIS — M25562 Pain in left knee: Secondary | ICD-10-CM | POA: Diagnosis not present

## 2015-06-22 DIAGNOSIS — R5381 Other malaise: Secondary | ICD-10-CM

## 2015-06-22 NOTE — Therapy (Signed)
[facility] [place], [place], [address] Phone: [phone]   Fax:  [phone]  Physical Therapy Treatment  Patient Details  Name: Angela Michael MRN: [id number] Date of Birth: Jan 13, 1961 Referring Provider: Kerry Dory PA-C  Encounter Date: 06/22/2015      PT End of Session - 06/22/15 1045    Visit Number 4   Number of Visits 9   Date for PT Re-Evaluation 07/20/15   PT Start Time 0953   PT Stop Time 1043   PT Time Calculation (min) 50 min   Activity Tolerance Patient tolerated treatment well   Behavior During Therapy [facility] for tasks assessed/performed      Past Medical History  Diagnosis Date  . History of syncope   . Depression   . PVC's (premature ventricular contractions)   . Chronic pain   . Chronic back pain   . Chronic neck pain   . Peripheral edema   . Hyperlipidemia   . Anemia   . Neuropathy ([place])   . SVT (supraventricular tachycardia) ([place])   . Diabetes mellitus without complication ([place]) Q000111Q    patient denies being diabetic, no meds  . Lymphoma ([place])   . Seizures [facility])     Past Surgical History  Procedure Laterality Date  . Gastric bypass      Says she had lost 345 lbs  . Venous thrombectomy      Blood clot resection from right arm  . Lymph node dissection Left     Resected  . Skin surgery      Removal of excess skin  . Vena cava filter placement    . Lymph node removed from stomach    . Cholecystectomy    . Cataract extraction Right   . Endovenous ablation saphenous vein w/ laser Right 09-08-2013    EVLA right small saphenous vein and stab phlebectomy 10-20 incisions right leg by Curt Jews MD  . Medial partial knee replacement  05/10/14    left knee  . Medial partial knee replacement Left 06/04/2015    [facility]    There were no vitals filed for this visit.  Visit Diagnosis:  Left knee pain  Decreased range of motion of knee, left  Knee swelling, left  Debility       Subjective Assessment - 06/22/15 1007    Subjective Took my pain medication so I have no pain.   Patient Stated Goals Get out of pain.   Currently in Pain? No/denies                         [facility] Adult PT Treatment/Exercise - 06/22/15 0001    Knee/Hip Exercises: Aerobic   Nustep Level 4 x 16 minutes moving forward per patient tolerance to increase knee flexion.   Knee/Hip Exercises: Standing   Rocker Board Limitations 5 minutes   Modalities   Modalities Education officer, environmental Stimulation Location --  Left knee.   Electrical Stimulation Action IFC   Electrical Stimulation Parameters 1-10 HZ scan x 20 minutes.   Electrical Stimulation Goals Edema   Vasopneumatic   Number Minutes Vasopneumatic  20 minutes   Vasopnuematic Location  --  Left knee.   Vasopneumatic Pressure Medium   Manual Therapy   Manual Therapy Passive ROM   Manual therapy comments 5 minutes into flexion and extension.     Rockerboard x 5 minutes.  PT Long Term Goals - 06/08/15 1027    PT LONG TERM GOAL #1   Title Independent with a HEP.   Baseline No knowledge of appropriate ther ex.   Time 3   Period Weeks   Status New   PT LONG TERM GOAL #2   Title Decrease edema to within 3 cms of contralateral side to assist with range of motion gains and decrease pain.   Baseline Left is 9 cms > left.   Time 3   Period Weeks   Status New   PT LONG TERM GOAL #3   Title Full active left knee extension in order to normalize gait   Time 3   Period Weeks   Status New   PT LONG TERM GOAL #4   Title Active left knee flexion to 115 degrees+ so the patient can perform functional tasks and do so with pain not > 2-3/10.   Baseline 70 degrees of left knee flexion.   Time 3   Period Weeks   Status New   PT LONG TERM GOAL #5   Title Increase left hip and knee strength to a solid 4+/5 to provide good stability for accomplishment of functional  activities   Baseline 2 to 2+/5 strength grade.   Time 3   Period Weeks   Status New   Additional Long Term Goals   Additional Long Term Goals Yes   PT LONG TERM GOAL #6   Title Walk unassisted a community distance without an assistive device.   Baseline patient requires the use of a FWW currently.   Time 3   Period Weeks   Status New               Problem List Patient Active Problem List   Diagnosis Date Noted  . Presence of vena cava filter   . Pancytopenia ([place])   . SIRS (systemic inflammatory response syndrome) ([place]) 07/31/2014  . Lymphoma ([place])   . Seizures ([place]) 12/05/2013  . Heme positive stool 06/22/2013  . Melena 06/22/2013  . Varicose veins of lower extremities with other complications 123XX123  . Chest pain 11/12/2012  . Morbid obesity ([place]) 11/12/2012  . SVT (supraventricular tachycardia) ([place])   . Peripheral edema 08/06/2012  . Hyperlipidemia with target LDL less than 100 08/06/2012  . Neuropathy of upper extremity 08/06/2012  . Anemia 08/06/2012  . Hypokalemia 08/06/2012  . STRESS ELECTROCARDIOGRAM, ABNORMAL 01/31/2009  . Depression 11/04/2008  . SYNCOPE, HX OF 11/04/2008    APPLEGATE, [place] MPT 06/22/2015, 11:28 AM  [facility] [address] [place], [place], [address] Phone: [phone]   Fax:  [phone]  Name: Angela Michael MRN: [id number] Date of Birth: 1960/11/14

## 2015-06-25 ENCOUNTER — Encounter: Payer: Self-pay | Admitting: Physical Therapy

## 2015-06-25 ENCOUNTER — Ambulatory Visit: Payer: Medicaid Other | Admitting: Physical Therapy

## 2015-06-25 ENCOUNTER — Ambulatory Visit (INDEPENDENT_AMBULATORY_CARE_PROVIDER_SITE_OTHER): Payer: Medicaid Other | Admitting: Pharmacist

## 2015-06-25 DIAGNOSIS — R5381 Other malaise: Secondary | ICD-10-CM

## 2015-06-25 DIAGNOSIS — M25562 Pain in left knee: Secondary | ICD-10-CM

## 2015-06-25 DIAGNOSIS — M24662 Ankylosis, left knee: Secondary | ICD-10-CM

## 2015-06-25 DIAGNOSIS — M25462 Effusion, left knee: Secondary | ICD-10-CM

## 2015-06-25 DIAGNOSIS — Z7901 Long term (current) use of anticoagulants: Secondary | ICD-10-CM

## 2015-06-25 DIAGNOSIS — Z299 Encounter for prophylactic measures, unspecified: Secondary | ICD-10-CM

## 2015-06-25 LAB — COAGUCHEK XS/INR WAIVED
INR: 1.1 (ref 0.9–1.1)
Prothrombin Time: 13.6 s

## 2015-06-25 MED ORDER — WARFARIN SODIUM 2 MG PO TABS: 8.0000 mg | ORAL_TABLET | Freq: Every day | ORAL | Status: DC

## 2015-06-25 NOTE — Patient Instructions (Signed)
Anticoagulation Dose Instructions as of 06/25/2015      Angela Michael Tue Wed Thu Fri Sat   New Dose 8 mg 8 mg 8 mg 8 mg 8 mg 8 mg 8 mg    Description        Increase warfarin 2mg  to 4 tablets daily = 8mg  daily.  Continue lovenox / enoxiparin 40mg  daily until next protime.     INR was 1.1 today

## 2015-06-25 NOTE — Therapy (Signed)
[facility] [place], [place], [address] Phone: [phone]   Fax:  [phone]  Physical Therapy Treatment  Patient Details  Name: Angela Michael MRN: [id number] Date of Birth: [birth year] Referring Provider: Kerry Dory PA-C  Encounter Date: 06/25/2015      PT End of Session - 06/25/15 0823    Visit Number 5   Number of Visits 9   Date for PT Re-Evaluation 07/20/15   PT Start Time 0821   PT Stop Time 0907   PT Time Calculation (min) 46 min   Activity Tolerance Patient tolerated treatment well   Behavior During Therapy [facility] for tasks assessed/performed      Past Medical History  Diagnosis Date  . History of syncope   . Depression   . PVC's (premature ventricular contractions)   . Chronic pain   . Chronic back pain   . Chronic neck pain   . Peripheral edema   . Hyperlipidemia   . Anemia   . Neuropathy ([place])   . SVT (supraventricular tachycardia) ([place])   . Diabetes mellitus without complication ([place]) Q000111Q    patient denies being diabetic, no meds  . Lymphoma ([place])   . Seizures [facility])     Past Surgical History  Procedure Laterality Date  . Gastric bypass      Says she had lost 345 lbs  . Venous thrombectomy      Blood clot resection from right arm  . Lymph node dissection Left     Resected  . Skin surgery      Removal of excess skin  . Vena cava filter placement    . Lymph node removed from stomach    . Cholecystectomy    . Cataract extraction Right   . Endovenous ablation saphenous vein w/ laser Right 09-08-2013    EVLA right small saphenous vein and stab phlebectomy 10-20 incisions right leg by Curt Jews MD  . Medial partial knee replacement  05/10/14    left knee  . Medial partial knee replacement Left 06/04/2015    [facility]    There were no vitals filed for this visit.  Visit Diagnosis:  Left knee pain  Decreased range of motion of knee, left  Knee swelling, left  Debility       Subjective Assessment - 06/25/15 0822    Subjective States that her knee is okay and that it hasn't woke up yet this morning. States that the other night she slept without ice on her knee and states that was the wrong thing to do. Reports that she has been doing lunges and an exercise that stretches her L HS at home.   Limitations Walking   Patient Stated Goals Get out of pain.   Currently in Pain? No/denies            [facility] PT Assessment - 06/25/15 0001    Assessment   Medical Diagnosis Left total knee replacment.   Onset Date/Surgical Date 06/04/15   Next MD Visit 07/18/2015   Restrictions   Weight Bearing Restrictions No                     [facility] Adult PT Treatment/Exercise - 06/25/15 0001    Knee/Hip Exercises: Aerobic   Nustep L4, seat 4 x12 min for L knee ROM   Knee/Hip Exercises: Standing   Forward Lunges Left;2 sets;10 reps;5 seconds  on 14" step   Rocker Board 3 minutes   Modalities   Modalities Electrical  Stimulation;Vasopneumatic   Acupuncturist Location L knee   Chartered certified accountant IFC   Electrical Stimulation Parameters 1-10 Hz x15 min   Electrical Stimulation Goals Edema   Vasopneumatic   Number Minutes Vasopneumatic  15 minutes   Vasopnuematic Location  Knee   Vasopneumatic Pressure Medium   Vasopneumatic Temperature  34   Manual Therapy   Manual Therapy Passive ROM;Soft tissue mobilization   Soft tissue mobilization L patellar mobilizations in L/R, sup/inf and attempted incision mobilizations but unable due to glue stil in place   Passive ROM PROM of L knee in flex/ext with gentle holds at end range                      PT Long Term Goals - 06/25/15 0900    PT LONG TERM GOAL #1   Title Independent with a HEP.   Baseline No knowledge of appropriate ther ex.   Time 3   Period Weeks   Status Achieved   PT LONG TERM GOAL #2   Title Decrease edema to within 3 cms of contralateral  side to assist with range of motion gains and decrease pain.   Baseline Left is 9 cms > left.   Time 3   Period Weeks   Status On-going   PT LONG TERM GOAL #3   Title Full active left knee extension in order to normalize gait   Time 3   Period Weeks   Status On-going  AROM L knee extension -3 deg from neutral as of 06/25/2015   PT LONG TERM GOAL #4   Title Active left knee flexion to 115 degrees+ so the patient can perform functional tasks and do so with pain not > 2-3/10.   Baseline 70 degrees of left knee flexion.   Time 3   Period Weeks   Status Achieved  AROM L knee flexion 115 deg in supine 06/25/2015   PT LONG TERM GOAL #5   Title Increase left hip and knee strength to a solid 4+/5 to provide good stability for accomplishment of functional activities   Baseline 2 to 2+/5 strength grade.   Time 3   Period Weeks   Status On-going   PT LONG TERM GOAL #6   Title Walk unassisted a community distance without an assistive device.   Baseline patient requires the use of a FWW currently.   Time 3   Period Weeks   Status On-going  Ambulated into clinic without AD short distance as of  06/25/2015               Plan - 06/25/15 0856    Clinical Impression Statement Patient tolerated today's therapeutic exercises well without complaint of increased pain. Patient continues to report compliance with HEP from hospital and reports that she has been doing lunges and an exercise that she can feel a stretch in HS. AROM L knee flexion 115 deg and L knee extension as 3 deg from neutral. Demonstrates fairly good L patellar mobilizations with slightly more restriction in sup/inf than L/R. Firm end feels noted with L knee flex/ext PROM today. Normal modalities response noted following removal of the modalities. Ambulated into clinic today without AD today and states that she can ambulate short distances without AD but cannot tolerate long distances yet. Attempted L knee incision mobiliztions but  unable to complete secondary to glue still in place over some of the incision.   Pt will benefit from skilled therapeutic  intervention in order to improve on the following deficits Abnormal gait;Decreased activity tolerance;Decreased mobility;Decreased strength;Increased edema;Decreased range of motion;Pain   Rehab Potential Excellent   PT Frequency 2x / week   PT Duration 3 weeks   PT Treatment/Interventions ADLs/Self Care Home Management;Cryotherapy;Electrical Stimulation;Therapeutic exercise;Therapeutic activities;Manual techniques;Vasopneumatic Device   PT Next Visit Plan Continue with L knee TKR protocol per MPT POC.   Consulted and Agree with Plan of Care Patient        Problem List Patient Active Problem List   Diagnosis Date Noted  . Presence of vena cava filter   . Pancytopenia ([place])   . SIRS (systemic inflammatory response syndrome) ([place]) 07/31/2014  . Lymphoma ([place])   . Seizures ([place]) 12/05/2013  . Heme positive stool 06/22/2013  . Melena 06/22/2013  . Varicose veins of lower extremities with other complications 123XX123  . Chest pain 11/12/2012  . Morbid obesity ([place]) 11/12/2012  . SVT (supraventricular tachycardia) ([place])   . Peripheral edema 08/06/2012  . Hyperlipidemia with target LDL less than 100 08/06/2012  . Neuropathy of upper extremity 08/06/2012  . Anemia 08/06/2012  . Hypokalemia 08/06/2012  . STRESS ELECTROCARDIOGRAM, ABNORMAL 01/31/2009  . Depression 11/04/2008  . SYNCOPE, HX OF 11/04/2008    Wynelle Fanny, PTA 06/25/2015, 9:48 AM  [facility] [address] [place], [place], [address] Phone: [phone]   Fax:  [phone]  Name: Angela Michael MRN: [id number] Date of Birth: 08-25-1960

## 2015-06-28 ENCOUNTER — Encounter: Payer: Self-pay | Admitting: Pharmacist

## 2015-06-28 ENCOUNTER — Ambulatory Visit: Payer: Medicaid Other | Admitting: Physical Therapy

## 2015-06-29 ENCOUNTER — Encounter: Payer: Self-pay | Admitting: Nurse Practitioner

## 2015-07-02 ENCOUNTER — Ambulatory Visit (INDEPENDENT_AMBULATORY_CARE_PROVIDER_SITE_OTHER): Payer: Medicaid Other | Admitting: Pharmacist

## 2015-07-02 ENCOUNTER — Other Ambulatory Visit: Payer: Self-pay | Admitting: Nurse Practitioner

## 2015-07-02 ENCOUNTER — Ambulatory Visit: Payer: Medicaid Other | Admitting: Physical Therapy

## 2015-07-02 DIAGNOSIS — R5383 Other fatigue: Secondary | ICD-10-CM

## 2015-07-02 DIAGNOSIS — M25562 Pain in left knee: Secondary | ICD-10-CM | POA: Diagnosis not present

## 2015-07-02 DIAGNOSIS — G5691 Unspecified mononeuropathy of right upper limb: Secondary | ICD-10-CM

## 2015-07-02 DIAGNOSIS — R5381 Other malaise: Secondary | ICD-10-CM

## 2015-07-02 DIAGNOSIS — M25462 Effusion, left knee: Secondary | ICD-10-CM

## 2015-07-02 DIAGNOSIS — Z7901 Long term (current) use of anticoagulants: Secondary | ICD-10-CM | POA: Diagnosis not present

## 2015-07-02 DIAGNOSIS — Z299 Encounter for prophylactic measures, unspecified: Secondary | ICD-10-CM

## 2015-07-02 LAB — CBC WITH DIFFERENTIAL/PLATELET
Basophils Absolute: 0 10*3/uL (ref 0.0–0.2)
Basos: 0 %
EOS (ABSOLUTE): 0.1 10*3/uL (ref 0.0–0.4)
EOS: 4 %
HEMATOCRIT: 31.4 % — AB (ref 34.0–46.6)
HEMOGLOBIN: 10.2 g/dL — AB (ref 11.1–15.9)
Immature Grans (Abs): 0 10*3/uL (ref 0.0–0.1)
Immature Granulocytes: 0 %
LYMPHS ABS: 0.8 10*3/uL (ref 0.7–3.1)
Lymphs: 22 %
MCH: 30.3 pg (ref 26.6–33.0)
MCHC: 32.5 g/dL (ref 31.5–35.7)
MCV: 93 fL (ref 79–97)
MONOCYTES: 9 %
MONOS ABS: 0.3 10*3/uL (ref 0.1–0.9)
NEUTROS ABS: 2.4 10*3/uL (ref 1.4–7.0)
Neutrophils: 65 %
Platelets: 121 10*3/uL — ABNORMAL LOW (ref 150–379)
RBC: 3.37 x10E6/uL — AB (ref 3.77–5.28)
RDW: 14.2 % (ref 12.3–15.4)
WBC: 3.7 10*3/uL (ref 3.4–10.8)

## 2015-07-02 LAB — COAGUCHEK XS/INR WAIVED
INR: 1.6 — AB (ref 0.9–1.1)
Prothrombin Time: 19.7 s

## 2015-07-02 MED ORDER — FENTANYL 75 MCG/HR TD PT72: 75.0000 ug | MEDICATED_PATCH | TRANSDERMAL | Status: DC

## 2015-07-02 NOTE — Patient Instructions (Signed)
Anticoagulation Dose Instructions as of 07/02/2015      Angela Michael Tue Wed Thu Fri Sat   New Dose 6 mg 8 mg 8 mg 8 mg 6 mg 8 mg 8 mg    Description        Finish last dose of enoxaparin today.  Continue warfarin but change dose to 3 tablets on Sundays and Thursdays.  Take 4 tablets all other days.      INR was 1.6 today (goal is 1.8 to 2.2)

## 2015-07-02 NOTE — Therapy (Signed)
[facility] [place], [place], [address] Phone: [phone]   Fax:  [phone]  Physical Therapy Treatment  Patient Details  Name: Angela Michael MRN: [id number] Date of Birth: 1961-02-15 Referring Provider: Kerry Dory PA-C  Encounter Date: 07/02/2015      PT End of Session - 07/02/15 0902    Visit Number 6   Number of Visits 9   Date for PT Re-Evaluation 07/20/15   PT Start Time 0900   PT Stop Time 1003   PT Time Calculation (min) 63 min   Activity Tolerance Patient tolerated treatment well   Behavior During Therapy [facility] for tasks assessed/performed      Past Medical History  Diagnosis Date  . History of syncope   . Depression   . PVC's (premature ventricular contractions)   . Chronic pain   . Chronic back pain   . Chronic neck pain   . Peripheral edema   . Hyperlipidemia   . Anemia   . Neuropathy ([place])   . SVT (supraventricular tachycardia) ([place])   . Diabetes mellitus without complication ([place]) Q000111Q    patient denies being diabetic, no meds  . Lymphoma ([place])   . Seizures [facility])     Past Surgical History  Procedure Laterality Date  . Gastric bypass      Says she had lost 345 lbs  . Venous thrombectomy      Blood clot resection from right arm  . Lymph node dissection Left     Resected  . Skin surgery      Removal of excess skin  . Vena cava filter placement    . Lymph node removed from stomach    . Cholecystectomy    . Cataract extraction Right   . Endovenous ablation saphenous vein w/ laser Right 09-08-2013    EVLA right small saphenous vein and stab phlebectomy 10-20 incisions right leg by Curt Jews MD  . Medial partial knee replacement  05/10/14    left knee  . Medial partial knee replacement Left 06/04/2015    [facility]    There were no vitals filed for this visit.  Visit Diagnosis:  Left knee pain  Knee swelling, left  Debility      Subjective Assessment - 07/02/15 0903    Subjective Patient states that she was up on her knee for two days. She could feel the knee and it felt good when she sat down, but she could do what she needed to do. Following those two days the patient was "layed up" for two days icing the knee.   Patient Stated Goals Get out of pain.   Currently in Pain? No/denies            [facility] PT Assessment - 07/02/15 0001    Assessment   Medical Diagnosis Left total knee replacment.   Circumferential Edema   Circumferential - Left  L 43.5 cm; R 42.5 cm   ROM / Strength   AROM / PROM / Strength Strength;AROM;PROM   AROM   Overall AROM  Deficits   AROM Assessment Site Knee   Right/Left Knee Left   Left Knee Extension -2   Left Knee Flexion 100   PROM   PROM Assessment Site Knee   Right/Left Knee Left   Left Knee Extension 0   Left Knee Flexion 110   Strength   Overall Strength Deficits   Strength Assessment Site Hip;Knee   Right/Left Hip Left   Left Hip Flexion 4-/5  Left Hip Extension 4+/5   Left Hip ABduction 4+/5   Right/Left Knee Left   Left Knee Flexion 4-/5   Left Knee Extension 5/5                     [facility] Adult PT Treatment/Exercise - 07/02/15 0001    Knee/Hip Exercises: Aerobic   Nustep L6 seat, seat 4   Knee/Hip Exercises: Seated   Marching Limitations in straight back chair 3x10   Marching Weights 3 lbs.   Knee/Hip Exercises: Supine   Straight Leg Raises Left;Strengthening;3 sets;10 reps  4#   Knee/Hip Exercises: Prone   Hamstring Curl 3 sets;10 reps  4# (go up next time); cues to go slow   Modalities   Modalities Designer, multimedia Location L knee   Electrical Stimulation Action IFC   Electrical Stimulation Parameters 1-10 hz to tolerance x 15 min   Electrical Stimulation Goals Edema   Vasopneumatic   Number Minutes Vasopneumatic  15 minutes   Vasopnuematic Location  Knee   Vasopneumatic Pressure Medium   Vasopneumatic  Temperature  35                     PT Long Term Goals - 07/02/15 0926    PT LONG TERM GOAL #1   Title Independent with a HEP.   Baseline No knowledge of appropriate ther ex.   Time 3   Status Achieved   PT LONG TERM GOAL #2   Title Decrease edema to within 3 cms of contralateral side to assist with range of motion gains and decrease pain.   Time 3   Period Weeks   Status Achieved   PT LONG TERM GOAL #3   Title Full active left knee extension in order to normalize gait   Time 3   Period Weeks   Status On-going   PT LONG TERM GOAL #4   Title Active left knee flexion to 115 degrees+ so the patient can perform functional tasks and do so with pain not > 2-3/10.   Baseline 70 degrees of left knee flexion.   Time 3   Period Weeks   Status On-going   PT LONG TERM GOAL #5   Title Increase left hip and knee strength to a solid 4+/5 to provide good stability for accomplishment of functional activities   Baseline 2 to 2+/5 strength grade.   Time 3   Period Weeks   Status On-going               Plan - 07/02/15 1000    Clinical Impression Statement Patient is progressing well with LTG's. Her swelling has decreased significantly. Her active L knee flexion was decreased today versus last week which may be due to her increased activity, however extension has improved. Her strength is much better but she still has deficits with knee and hip flexion.   Pt will benefit from skilled therapeutic intervention in order to improve on the following deficits Abnormal gait;Decreased activity tolerance;Decreased mobility;Decreased strength;Increased edema;Decreased range of motion;Pain   Rehab Potential Excellent   PT Frequency 2x / week   PT Duration 3 weeks   PT Treatment/Interventions ADLs/Self Care Home Management;Cryotherapy;Electrical Stimulation;Therapeutic exercise;Therapeutic activities;Manual techniques;Vasopneumatic Device   PT Next Visit Plan Continue with L knee TKR  protocol per MPT POC. Focus on Left knee and hip flexion for strengthening.   Consulted and Agree with Plan of Care Patient  Problem List Patient Active Problem List   Diagnosis Date Noted  . Presence of vena cava filter   . Pancytopenia ([place])   . SIRS (systemic inflammatory response syndrome) ([place]) 07/31/2014  . Lymphoma ([place])   . Seizures ([place]) 12/05/2013  . Heme positive stool 06/22/2013  . Melena 06/22/2013  . Varicose veins of lower extremities with other complications 123XX123  . Chest pain 11/12/2012  . Morbid obesity ([place]) 11/12/2012  . SVT (supraventricular tachycardia) ([place])   . Peripheral edema 08/06/2012  . Hyperlipidemia with target LDL less than 100 08/06/2012  . Neuropathy of upper extremity 08/06/2012  . Anemia 08/06/2012  . Hypokalemia 08/06/2012  . STRESS ELECTROCARDIOGRAM, ABNORMAL 01/31/2009  . Depression 11/04/2008  . SYNCOPE, HX OF 11/04/2008   Madelyn Flavors PT  07/02/2015, 10:27 PM  [facility] [place], [place], [address] Phone: [phone]   Fax:  [phone]  Name: Angela Michael MRN: [id number] Date of Birth: 1961-03-27

## 2015-07-03 ENCOUNTER — Other Ambulatory Visit: Payer: Self-pay | Admitting: Pharmacist

## 2015-07-03 MED ORDER — FERROUS SULFATE 325 (65 FE) MG PO TABS: 325.0000 mg | ORAL_TABLET | Freq: Every day | ORAL | Status: DC

## 2015-07-05 ENCOUNTER — Ambulatory Visit (INDEPENDENT_AMBULATORY_CARE_PROVIDER_SITE_OTHER): Payer: Medicaid Other | Admitting: Pharmacist

## 2015-07-05 ENCOUNTER — Ambulatory Visit: Payer: Medicaid Other | Admitting: Physical Therapy

## 2015-07-05 DIAGNOSIS — Z7901 Long term (current) use of anticoagulants: Secondary | ICD-10-CM | POA: Diagnosis not present

## 2015-07-05 DIAGNOSIS — M25562 Pain in left knee: Secondary | ICD-10-CM

## 2015-07-05 DIAGNOSIS — M25462 Effusion, left knee: Secondary | ICD-10-CM

## 2015-07-05 DIAGNOSIS — Z299 Encounter for prophylactic measures, unspecified: Secondary | ICD-10-CM

## 2015-07-05 DIAGNOSIS — M24662 Ankylosis, left knee: Secondary | ICD-10-CM

## 2015-07-05 DIAGNOSIS — R5381 Other malaise: Secondary | ICD-10-CM

## 2015-07-05 LAB — COAGUCHEK XS/INR WAIVED
INR: 1.6 — ABNORMAL HIGH (ref 0.9–1.1)
Prothrombin Time: 19.6 s

## 2015-07-05 NOTE — Therapy (Signed)
[facility] [place], [place], [address] Phone: [phone]   Fax:  [phone]  Physical Therapy Treatment  Patient Details  Name: Angela Michael MRN: [id number] Date of Birth: 1960/10/05 Referring Provider: Kerry Dory PA-C  Encounter Date: 07/05/2015      PT End of Session - 07/05/15 0906    Visit Number 7   Number of Visits 9   Date for PT Re-Evaluation 07/20/15   PT Start Time 0905   PT Stop Time 1002   PT Time Calculation (min) 57 min   Activity Tolerance Patient tolerated treatment well   Behavior During Therapy [facility] for tasks assessed/performed      Past Medical History  Diagnosis Date  . History of syncope   . Depression   . PVC's (premature ventricular contractions)   . Chronic pain   . Chronic back pain   . Chronic neck pain   . Peripheral edema   . Hyperlipidemia   . Anemia   . Neuropathy ([place])   . SVT (supraventricular tachycardia) ([place])   . Diabetes mellitus without complication ([place]) [id number]    patient denies being diabetic, no meds  . Lymphoma ([place])   . Seizures [facility])     Past Surgical History  Procedure Laterality Date  . Gastric bypass      Says she had lost 345 lbs  . Venous thrombectomy      Blood clot resection from right arm  . Lymph node dissection Left     Resected  . Skin surgery      Removal of excess skin  . Vena cava filter placement    . Lymph node removed from stomach    . Cholecystectomy    . Cataract extraction Right   . Endovenous ablation saphenous vein w/ laser Right 09-08-2013    EVLA right small saphenous vein and stab phlebectomy 10-20 incisions right leg by Curt Jews MD  . Medial partial knee replacement  05/10/14    left knee  . Medial partial knee replacement Left 06/04/2015    [facility]    There were no vitals filed for this visit.  Visit Diagnosis:  Decreased range of motion of knee, left  Debility  Knee swelling, left  Left knee pain       Subjective Assessment - 07/05/15 0906    Subjective Patient reports no pain today.   Limitations Walking   Patient Stated Goals Get out of pain.   Currently in Pain? No/denies                         [facility] Adult PT Treatment/Exercise - 07/05/15 0001    Knee/Hip Exercises: Aerobic   Nustep L6 seat, seat 4 x 15   Knee/Hip Exercises: Machines for Strengthening   Total Gym Leg Press 2 plates x 40  increase weight next visit   Knee/Hip Exercises: Standing   Forward Step Up 4 sets;5 reps;Both;Hand Hold: 2;Step Height: 8"   Knee/Hip Exercises: Seated   Long Arc Quad Strengthening;Left;4 sets;10 reps  4#   Marching Limitations in straight back chair 3x10   Knee/Hip Exercises: Supine   Quad Sets --   Straight Leg Raises Left;Strengthening;3 sets;10 reps  4#   Straight Leg Raise with External Rotation Strengthening;Left;3 sets;10 reps  4#                     PT Long Term Goals - 07/02/15 [id number]    PT  LONG TERM GOAL #1   Title Independent with a HEP.   Baseline No knowledge of appropriate ther ex.   Time 3   Status Achieved   PT LONG TERM GOAL #2   Title Decrease edema to within 3 cms of contralateral side to assist with range of motion gains and decrease pain.   Time 3   Period Weeks   Status Achieved   PT LONG TERM GOAL #3   Title Full active left knee extension in order to normalize gait   Time 3   Period Weeks   Status On-going   PT LONG TERM GOAL #4   Title Active left knee flexion to 115 degrees+ so the patient can perform functional tasks and do so with pain not > 2-3/10.   Baseline 70 degrees of left knee flexion.   Time 3   Period Weeks   Status On-going   PT LONG TERM GOAL #5   Title Increase left hip and knee strength to a solid 4+/5 to provide good stability for accomplishment of functional activities   Baseline 2 to 2+/5 strength grade.   Time 3   Period Weeks   Status On-going               Plan - 07/05/15 1057     Clinical Impression Statement Patient tolerated increased TE well today with mild pain increase.   PT Next Visit Plan Continue with L knee TKR protocol per MPT POC. Focus on Left knee and hip flexion for strengthening. Step Ups and stairs also.        Problem List Patient Active Problem List   Diagnosis Date Noted  . Presence of vena cava filter   . Pancytopenia ([place])   . SIRS (systemic inflammatory response syndrome) ([place]) 07/31/2014  . Lymphoma ([place])   . Seizures ([place]) 12/05/2013  . Heme positive stool 06/22/2013  . Melena 06/22/2013  . Varicose veins of lower extremities with other complications [id number]  . Chest pain 11/12/2012  . Morbid obesity ([place]) 11/12/2012  . SVT (supraventricular tachycardia) ([place])   . Peripheral edema 08/06/2012  . Hyperlipidemia with target LDL less than 100 08/06/2012  . Neuropathy of upper extremity 08/06/2012  . Anemia 08/06/2012  . Hypokalemia 08/06/2012  . STRESS ELECTROCARDIOGRAM, ABNORMAL 01/31/2009  . Depression 11/04/2008  . SYNCOPE, HX OF 11/04/2008    Madelyn Flavors PT  07/05/2015, 11:01 AM  [facility] [address] [place], [place], [address] Phone: [phone]   Fax:  [phone]  Name: Angela Michael MRN: [id number] Date of Birth: 1960/10/15

## 2015-07-05 NOTE — Patient Instructions (Signed)
Anticoagulation Dose Instructions as of 07/05/2015      Dorene Grebe Tue Wed Thu Fri Sat   New Dose 8 mg 8 mg 8 mg 8 mg 8 mg 8 mg 8 mg    Description        Increase to warfarin 4 tablets daily = 8mg  a day.   Goal INR 1.8 to 2.2     INR was 1.6 today

## 2015-07-07 ENCOUNTER — Other Ambulatory Visit: Payer: Self-pay | Admitting: Nurse Practitioner

## 2015-07-09 ENCOUNTER — Encounter: Payer: Self-pay | Admitting: Physical Therapy

## 2015-07-09 ENCOUNTER — Ambulatory Visit: Payer: Medicaid Other | Admitting: Physical Therapy

## 2015-07-09 DIAGNOSIS — R5381 Other malaise: Secondary | ICD-10-CM

## 2015-07-09 DIAGNOSIS — M25562 Pain in left knee: Secondary | ICD-10-CM | POA: Diagnosis not present

## 2015-07-09 DIAGNOSIS — M25462 Effusion, left knee: Secondary | ICD-10-CM

## 2015-07-09 DIAGNOSIS — M24662 Ankylosis, left knee: Secondary | ICD-10-CM

## 2015-07-09 NOTE — Telephone Encounter (Signed)
Last seen 06/08/15  MMM

## 2015-07-09 NOTE — Therapy (Signed)
[facility] [place], [place], [address] Phone: [phone]   Fax:  [phone]  Physical Therapy Treatment  Patient Details  Name: Angela Michael MRN: [id number] Date of Birth: 05-19-1960 Referring Provider: Kerry Dory PA-C  Encounter Date: 07/09/2015      PT End of Session - 07/09/15 1107    Visit Number 8   Number of Visits 9   Date for PT Re-Evaluation 07/20/15   PT Start Time [id number]   PT Stop Time 1123   PT Time Calculation (min) 47 min   Activity Tolerance Patient tolerated treatment well   Behavior During Therapy [facility] for tasks assessed/performed      Past Medical History  Diagnosis Date  . History of syncope   . Depression   . PVC's (premature ventricular contractions)   . Chronic pain   . Chronic back pain   . Chronic neck pain   . Peripheral edema   . Hyperlipidemia   . Anemia   . Neuropathy ([place])   . SVT (supraventricular tachycardia) ([place])   . Diabetes mellitus without complication ([place]) Q000111Q    patient denies being diabetic, no meds  . Lymphoma ([place])   . Seizures [facility])     Past Surgical History  Procedure Laterality Date  . Gastric bypass      Says she had lost 345 lbs  . Venous thrombectomy      Blood clot resection from right arm  . Lymph node dissection Left     Resected  . Skin surgery      Removal of excess skin  . Vena cava filter placement    . Lymph node removed from stomach    . Cholecystectomy    . Cataract extraction Right   . Endovenous ablation saphenous vein w/ laser Right 09-08-2013    EVLA right small saphenous vein and stab phlebectomy 10-20 incisions right leg by Curt Jews MD  . Medial partial knee replacement  05/10/14    left knee  . Medial partial knee replacement Left 06/04/2015    [facility]    There were no vitals filed for this visit.  Visit Diagnosis:  Decreased range of motion of knee, left  Debility  Knee swelling, left  Left knee pain       Subjective Assessment - 07/09/15 1039    Subjective Patient reported being sick today and has some soreness in knee today   Limitations Walking   Patient Stated Goals Get out of pain.   Currently in Pain? Yes   Pain Score 2    Pain Location Knee   Pain Orientation Left   Pain Descriptors / Indicators Aching   Pain Type Surgical pain   Pain Onset 1 to 4 weeks ago   Pain Frequency Constant   Aggravating Factors  increased activity   Pain Relieving Factors rest, meds            [facility] PT Assessment - 07/09/15 0001    AROM   AROM Assessment Site Knee   Right/Left Knee Left   Left Knee Extension -5   Left Knee Flexion 100   PROM   PROM Assessment Site Knee   Right/Left Knee Left   Left Knee Extension 0   Left Knee Flexion 104                     [facility] Adult PT Treatment/Exercise - 07/09/15 0001    Knee/Hip Exercises: Aerobic   Nustep L6 seat, seat  4 x 15   Knee/Hip Exercises: Standing   Forward Step Up Left;3 sets;10 reps;Step Height: 6"   Knee/Hip Exercises: Seated   Long Arc Quad Strengthening;Left;3 sets;10 reps   Long Arc Quad Weight 4 lbs.   Marching Limitations in straight back chair 3x10   Marching Weights 4 lbs.   Knee/Hip Exercises: Sidelying   Hip ABduction Strengthening;Left;3 sets;10 reps   Clams Nutritional therapist Location L knee   Electrical Stimulation Action IFC   Electrical Stimulation Parameters 1-10hz    Electrical Stimulation Goals Edema   Vasopneumatic   Number Minutes Vasopneumatic  15 minutes   Vasopnuematic Location  Knee   Vasopneumatic Pressure Medium   Manual Therapy   Manual Therapy Passive ROM   Passive ROM PROM of L knee in flex/ext with gentle holds at end range                 PT Education - 07/09/15 1120    Education provided Yes   Education Details HEP   Person(s) Educated Patient   Methods Explanation;Demonstration;Handout   Comprehension Verbalized  understanding;Returned demonstration             PT Long Term Goals - 07/09/15 1110    PT LONG TERM GOAL #1   Title Independent with a HEP.   Baseline No knowledge of appropriate ther ex.   Time 3   Period Weeks   Status Achieved   PT LONG TERM GOAL #2   Title Decrease edema to within 3 cms of contralateral side to assist with range of motion gains and decrease pain.   Baseline Left is 9 cms > left.   Time 3   Period Weeks   Status Achieved   PT LONG TERM GOAL #3   Title Full active left knee extension in order to normalize gait   Time 3   Period Weeks   Status On-going  AROM -5 degrees 07/09/15   PT LONG TERM GOAL #4   Title Active left knee flexion to 115 degrees+ so the patient can perform functional tasks and do so with pain not > 2-3/10.   Baseline 70 degrees of left knee flexion.   Time 3   Period Weeks   Status On-going  AROM 100 degrees 07/09/15   PT LONG TERM GOAL #5   Title Increase left hip and knee strength to a solid 4+/5 to provide good stability for accomplishment of functional activities   Baseline 2 to 2+/5 strength grade.   Time 3   Period Weeks   Status On-going   PT LONG TERM GOAL #6   Title Walk unassisted a community distance without an assistive device.   Baseline patient requires the use of a FWW currently.   Time 3   Period Weeks   Status On-going               Plan - 07/09/15 1113    Clinical Impression Statement Patient progressing slowly due to visit limitations. Patient has less pain overall yet has weakness per patient and difficulty with ADL's due deficits. Patient unable to meet any further goals due to pain strength and ROM deficits.Patient  had decreased  ROM due to pain and guarding today. HEP given today for ROM and strengthening left knee.   Pt will benefit from skilled therapeutic intervention in order to improve on the following deficits Abnormal gait;Decreased activity tolerance;Decreased mobility;Decreased  strength;Increased edema;Decreased range of motion;Pain   Rehab Potential Excellent  PT Frequency 2x / week   PT Duration 3 weeks   PT Treatment/Interventions ADLs/Self Care Home Management;Cryotherapy;Electrical Stimulation;Therapeutic exercise;Therapeutic activities;Manual techniques;Vasopneumatic Device   PT Next Visit Plan Continue with L knee TKR protocol per MPT POC. Focus on Left knee and hip flexion for strengthening. Step Ups and stairs also.   Consulted and Agree with Plan of Care Patient        Problem List Patient Active Problem List   Diagnosis Date Noted  . Presence of vena cava filter   . Pancytopenia ([place])   . SIRS (systemic inflammatory response syndrome) ([place]) 07/31/2014  . Lymphoma ([place])   . Seizures ([place]) 12/05/2013  . Heme positive stool 06/22/2013  . Melena 06/22/2013  . Varicose veins of lower extremities with other complications [id number]  . Chest pain 11/12/2012  . Morbid obesity ([place]) 11/12/2012  . SVT (supraventricular tachycardia) ([place])   . Peripheral edema 08/06/2012  . Hyperlipidemia with target LDL less than 100 08/06/2012  . Neuropathy of upper extremity 08/06/2012  . Anemia 08/06/2012  . Hypokalemia 08/06/2012  . STRESS ELECTROCARDIOGRAM, ABNORMAL 01/31/2009  . Depression 11/04/2008  . SYNCOPE, HX OF 11/04/2008    Phillips Climes, PTA 07/09/2015, 11:24 AM  [facility] [place], [place], [address] Phone: [phone]   Fax:  [phone]  Name: Angela Michael MRN: [id number] Date of Birth: 05/31/1960

## 2015-07-09 NOTE — Patient Instructions (Signed)
   Knee Flexion Stretch on Step  Stand on step, right leg off step, knee straight. Raise unsupported hip, keeping knee straight. Repeat _5-10___ times per set. Do __1-2__ sets per session. Do __1-2__ sessions  Knee Extension Mobilization: Towel Prop   With rolled towel under right ankle, place _1-5___ pound weight across knee. Hold __5+__ minutes. Repeat __2-3__ times per set. Do __2__ sets per session. Do __2-4__ sessions per day.   Strengthening: Quadriceps Set   Tighten muscles on top of thighs by pushing knees down into surface. Hold __10__ seconds. Repeat _10___ times per set. Do __2-3__ sets per session. Do _2-3___ sessions per day.   Strengthening: Terminal Knee Extension (Supine)   Knee Extension (Sitting)   Place __0-4__ pound weight on left ankle and straighten knee fully, lower slowly. Repeat _10___ times per set. Do __2-3__ sets per session. Do __2-3__ sessions per day.

## 2015-07-11 ENCOUNTER — Encounter: Payer: Self-pay | Admitting: Physical Therapy

## 2015-07-11 ENCOUNTER — Ambulatory Visit (INDEPENDENT_AMBULATORY_CARE_PROVIDER_SITE_OTHER): Payer: Medicaid Other | Admitting: Pharmacist

## 2015-07-11 ENCOUNTER — Ambulatory Visit: Payer: Medicaid Other | Admitting: Physical Therapy

## 2015-07-11 DIAGNOSIS — M24662 Ankylosis, left knee: Secondary | ICD-10-CM

## 2015-07-11 DIAGNOSIS — Z7901 Long term (current) use of anticoagulants: Secondary | ICD-10-CM

## 2015-07-11 DIAGNOSIS — M25562 Pain in left knee: Secondary | ICD-10-CM

## 2015-07-11 DIAGNOSIS — R5381 Other malaise: Secondary | ICD-10-CM

## 2015-07-11 DIAGNOSIS — Z299 Encounter for prophylactic measures, unspecified: Secondary | ICD-10-CM

## 2015-07-11 DIAGNOSIS — M25462 Effusion, left knee: Secondary | ICD-10-CM

## 2015-07-11 LAB — COAGUCHEK XS/INR WAIVED
INR: 1.5 — ABNORMAL HIGH (ref 0.9–1.1)
PROTHROMBIN TIME: 18.2 s

## 2015-07-11 NOTE — Therapy (Signed)
[facility] [place], [place], [address] Phone: [phone]   Fax:  [phone]  Physical Therapy Treatment  Patient Details  Name: Angela Michael MRN: [id number] Date of Birth: 08-10-1960 Referring Provider: Kerry Dory PA-C  Encounter Date: 07/11/2015      PT End of Session - 07/11/15 0902    Visit Number 9   Number of Visits 9   Date for PT Re-Evaluation 07/20/15   PT Start Time 0858   PT Stop Time 0951   PT Time Calculation (min) 53 min   Activity Tolerance Patient tolerated treatment well   Behavior During Therapy [facility] for tasks assessed/performed      Past Medical History  Diagnosis Date  . History of syncope   . Depression   . PVC's (premature ventricular contractions)   . Chronic pain   . Chronic back pain   . Chronic neck pain   . Peripheral edema   . Hyperlipidemia   . Anemia   . Neuropathy ([place])   . SVT (supraventricular tachycardia) ([place])   . Diabetes mellitus without complication ([place]) 10/27/9676    patient denies being diabetic, no meds  . Lymphoma ([place])   . Seizures [facility])     Past Surgical History  Procedure Laterality Date  . Gastric bypass      Says she had lost 345 lbs  . Venous thrombectomy      Blood clot resection from right arm  . Lymph node dissection Left     Resected  . Skin surgery      Removal of excess skin  . Vena cava filter placement    . Lymph node removed from stomach    . Cholecystectomy    . Cataract extraction Right   . Endovenous ablation saphenous vein w/ laser Right 09-08-2013    EVLA right small saphenous vein and stab phlebectomy 10-20 incisions right leg by Curt Jews MD  . Medial partial knee replacement  05/10/14    left knee  . Medial partial knee replacement Left 06/04/2015    [facility]    There were no vitals filed for this visit.  Visit Diagnosis:  Decreased range of motion of knee, left  Debility  Knee swelling, left  Left knee pain       Subjective Assessment - 07/11/15 0900    Subjective Patient reports her knee is doing good overall but states that found a string coming out of incision. States she has tried to remove the string with tweezers and it will not turn loose. Patient states she is going to go to [facility] after treatment today to see what they can do.   Limitations Walking   Patient Stated Goals Get out of pain.   Currently in Pain? Other (Comment)  Reports only ache but no real pain per patient report            [facility] PT Assessment - 07/11/15 0001    Assessment   Medical Diagnosis Left total knee replacment.   Onset Date/Surgical Date 06/04/15   Next MD Visit 07/18/2015   Restrictions   Weight Bearing Restrictions No   ROM / Strength   AROM / PROM / Strength AROM;Strength   AROM   Overall AROM  Deficits;Within functional limits for tasks performed   AROM Assessment Site Knee   Right/Left Knee Left   Left Knee Extension -3   Left Knee Flexion 115   Strength   Strength Assessment Site Hip;Knee   Right/Left Hip Left  Left Hip Flexion 4+/5   Left Hip ABduction 4+/5   Right/Left Knee Left   Left Knee Flexion 4+/5   Left Knee Extension 5/5                     [facility] Adult PT Treatment/Exercise - 07/11/15 0001    Knee/Hip Exercises: Aerobic   Nustep L6, seat 4 x15 min   Knee/Hip Exercises: Machines for Strengthening   Total Gym Leg Press 3 pl, seat 7 3x10 reps   Knee/Hip Exercises: Standing   Lateral Step Up Left;3 sets;10 reps;Hand Hold: 2;Step Height: 6"   Forward Step Up Left;3 sets;10 reps;Step Height: 6"   Rocker Board 3 minutes   Modalities   Modalities Designer, multimedia Location L knee   Electrical Stimulation Action IFC   Electrical Stimulation Parameters 1-10 Hz x15 min   Electrical Stimulation Goals Edema   Vasopneumatic   Number Minutes Vasopneumatic  15 minutes   Vasopnuematic Location  Knee    Vasopneumatic Pressure Medium   Vasopneumatic Temperature  51                     PT Long Term Goals - 07/11/15 0915    PT LONG TERM GOAL #1   Title Independent with a HEP.   Baseline No knowledge of appropriate ther ex.   Time 3   Period Weeks   Status Achieved   PT LONG TERM GOAL #2   Title Decrease edema to within 3 cms of contralateral side to assist with range of motion gains and decrease pain.   Baseline Left is 9 cms > left.   Time 3   Period Weeks   Status Achieved   PT LONG TERM GOAL #3   Title Full active left knee extension in order to normalize gait   Time 3   Period Weeks   Status Not Met  AROM L knee extension -3 deg from neutral 07/11/2015   PT LONG TERM GOAL #4   Title Active left knee flexion to 115 degrees+ so the patient can perform functional tasks and do so with pain not > 2-3/10.   Baseline 70 degrees of left knee flexion.   Time 3   Period Weeks   Status Achieved  AROM L knee flexion 115 deg 07/11/2015   PT LONG TERM GOAL #5   Title Increase left hip and knee strength to a solid 4+/5 to provide good stability for accomplishment of functional activities   Baseline 2 to 2+/5 strength grade.   Time 3   Period Weeks   Status Achieved  L hip and knee strength ranges from 4+/5 - 5/5 as of 07/11/2015   PT LONG TERM GOAL #6   Title Walk unassisted a community distance without an assistive device.   Baseline patient requires the use of a FWW currently.   Time 3   Period Weeks   Status Partially Met  Uses AD only after prolonged acitivity once she is fatigued per patient report 07/11/2015               Plan - 07/11/15 0940    Clinical Impression Statement Patient has done well in PT and has achieved all goals except for AD goal for ambulation and L knee extension goal. Patient able to tolerate directed exercises well only using minimal multimodal cueing for correct exercise technique or corrections. L knee AROM measured as 3-115 deg today  and L knee and hip MMT ranged from 4+/5 to 5/5 today. Patient is only using AD after prolonged activity when fatigue sets in per patient report. Normal modaliteis response noted following removal of the modaliites. A small white material was noted in medioinferior incision and patient was advised to have MD to check and see what that object is. Patient continues to ambulate with slight antalgic gait in clinic.   Pt will benefit from skilled therapeutic intervention in order to improve on the following deficits Abnormal gait;Decreased activity tolerance;Decreased mobility;Decreased strength;Increased edema;Decreased range of motion;Pain   Rehab Potential Excellent   PT Frequency 2x / week   PT Duration 3 weeks   PT Treatment/Interventions ADLs/Self Care Home Management;Cryotherapy;Electrical Stimulation;Therapeutic exercise;Therapeutic activities;Manual techniques;Vasopneumatic Device   PT Next Visit Plan Communicate to MPT for need of D/C summary.   Consulted and Agree with Plan of Care Patient        Problem List Patient Active Problem List   Diagnosis Date Noted  . Presence of vena cava filter   . Pancytopenia ([place])   . SIRS (systemic inflammatory response syndrome) ([place]) 07/31/2014  . Lymphoma ([place])   . Seizures ([place]) 12/05/2013  . Heme positive stool 06/22/2013  . Melena 06/22/2013  . Varicose veins of lower extremities with other complications 45/14/6047  . Chest pain 11/12/2012  . Morbid obesity ([place]) 11/12/2012  . SVT (supraventricular tachycardia) ([place])   . Peripheral edema 08/06/2012  . Hyperlipidemia with target LDL less than 100 08/06/2012  . Neuropathy of upper extremity 08/06/2012  . Anemia 08/06/2012  . Hypokalemia 08/06/2012  . STRESS ELECTROCARDIOGRAM, ABNORMAL 01/31/2009  . Depression 11/04/2008  . SYNCOPE, HX OF 11/04/2008   Ahmed Prima, PTA 07/11/2015 10:07 AM  [facility] [place], [place],  [address] Phone: [phone]   Fax:  [phone]  Name: Angela Michael MRN: [id number] Date of Birth: January 30, 1961

## 2015-07-11 NOTE — Patient Instructions (Signed)
Anticoagulation Dose Instructions as of 07/11/2015      Angela Michael Tue Wed Thu Fri Sat   New Dose 8 mg 8 mg 8 mg 10 mg 8 mg 8 mg 8 mg    Description        Increase dose to 5 tablets on wednesdays and 4 tablets all other days. Goal INR 1.8 to 2.2     INR was 1.5 today

## 2015-07-13 NOTE — Therapy (Signed)
[facility] [place], [place], [address] Phone: [phone]   Fax:  [phone]  Physical Therapy Treatment  Patient Details  Name: Angela Michael MRN: [id number] Date of Birth: 1960/12/07 Referring Provider: Kerry Dory PA-C  Encounter Date: 07/11/2015    Past Medical History  Diagnosis Date  . History of syncope   . Depression   . PVC's (premature ventricular contractions)   . Chronic pain   . Chronic back pain   . Chronic neck pain   . Peripheral edema   . Hyperlipidemia   . Anemia   . Neuropathy ([place])   . SVT (supraventricular tachycardia) ([place])   . Diabetes mellitus without complication ([place]) 4/00/8676    patient denies being diabetic, no meds  . Lymphoma ([place])   . Seizures [facility])     Past Surgical History  Procedure Laterality Date  . Gastric bypass      Says she had lost 345 lbs  . Venous thrombectomy      Blood clot resection from right arm  . Lymph node dissection Left     Resected  . Skin surgery      Removal of excess skin  . Vena cava filter placement    . Lymph node removed from stomach    . Cholecystectomy    . Cataract extraction Right   . Endovenous ablation saphenous vein w/ laser Right 09-08-2013    EVLA right small saphenous vein and stab phlebectomy 10-20 incisions right leg by Curt Jews MD  . Medial partial knee replacement  05/10/14    left knee  . Medial partial knee replacement Left 06/04/2015    [facility]    There were no vitals filed for this visit.  Visit Diagnosis:  Decreased range of motion of knee, left  Debility  Knee swelling, left  Left knee pain                                    PT Long Term Goals - 07/11/15 0915    PT LONG TERM GOAL #1   Title Independent with a HEP.   Baseline No knowledge of appropriate ther ex.   Time 3   Period Weeks   Status Achieved   PT LONG TERM GOAL #2   Title Decrease edema to within 3 cms of  contralateral side to assist with range of motion gains and decrease pain.   Baseline Left is 9 cms > left.   Time 3   Period Weeks   Status Achieved   PT LONG TERM GOAL #3   Title Full active left knee extension in order to normalize gait   Time 3   Period Weeks   Status Not Met  AROM L knee extension -3 deg from neutral 07/11/2015   PT LONG TERM GOAL #4   Title Active left knee flexion to 115 degrees+ so the patient can perform functional tasks and do so with pain not > 2-3/10.   Baseline 70 degrees of left knee flexion.   Time 3   Period Weeks   Status Achieved  AROM L knee flexion 115 deg 07/11/2015   PT LONG TERM GOAL #5   Title Increase left hip and knee strength to a solid 4+/5 to provide good stability for accomplishment of functional activities   Baseline 2 to 2+/5 strength grade.   Time 3   Period Weeks   Status Achieved  L hip and knee strength ranges from 4+/5 - 5/5 as of 07/11/2015   PT LONG TERM GOAL #6   Title Walk unassisted a community distance without an assistive device.   Baseline patient requires the use of a FWW currently.   Time 3   Period Weeks   Status Partially Met  Uses AD only after prolonged acitivity once she is fatigued per patient report 07/11/2015               Problem List Patient Active Problem List   Diagnosis Date Noted  . Presence of vena cava filter   . Pancytopenia ([place])   . SIRS (systemic inflammatory response syndrome) ([place]) 07/31/2014  . Lymphoma ([place])   . Seizures ([place]) 12/05/2013  . Heme positive stool 06/22/2013  . Melena 06/22/2013  . Varicose veins of lower extremities with other complications 80/09/3492  . Chest pain 11/12/2012  . Morbid obesity ([place]) 11/12/2012  . SVT (supraventricular tachycardia) ([place])   . Peripheral edema 08/06/2012  . Hyperlipidemia with target LDL less than 100 08/06/2012  . Neuropathy of upper extremity 08/06/2012  . Anemia 08/06/2012  . Hypokalemia 08/06/2012  . STRESS  ELECTROCARDIOGRAM, ABNORMAL 01/31/2009  . Depression 11/04/2008  . SYNCOPE, HX OF 11/04/2008   PHYSICAL THERAPY DISCHARGE SUMMARY  Visits from Start of Care:   Current functional level related to goals / functional outcomes: Please see above.   Remaining deficits: Patient just short of meeting her knee extension goal and uses an AD after being up for prolonged periods of time.   Education / Equipment: HEP.  Plan: Patient agrees to discharge.  Patient goals were not met. Patient is being discharged due to meeting the stated rehab goals.  ?????      Oluwadara Gorman, [place] MPT 07/13/2015, 5:31 PM  [facility] [address] [place], [place], [address] Phone: [phone]   Fax:  [phone]  Name: Angela Michael MRN: [id number] Date of Birth: 1960/09/27

## 2015-08-06 ENCOUNTER — Ambulatory Visit (INDEPENDENT_AMBULATORY_CARE_PROVIDER_SITE_OTHER): Payer: Medicaid Other | Admitting: Nurse Practitioner

## 2015-08-06 ENCOUNTER — Encounter: Payer: Self-pay | Admitting: Nurse Practitioner

## 2015-08-06 VITALS — BP 111/65 | HR 45 | Temp 96.9°F | Ht 61.0 in | Wt 197.0 lb

## 2015-08-06 DIAGNOSIS — G4700 Insomnia, unspecified: Secondary | ICD-10-CM

## 2015-08-06 DIAGNOSIS — Z7189 Other specified counseling: Secondary | ICD-10-CM | POA: Diagnosis not present

## 2015-08-06 DIAGNOSIS — E785 Hyperlipidemia, unspecified: Secondary | ICD-10-CM

## 2015-08-06 DIAGNOSIS — D61818 Other pancytopenia: Secondary | ICD-10-CM | POA: Diagnosis not present

## 2015-08-06 DIAGNOSIS — F329 Major depressive disorder, single episode, unspecified: Secondary | ICD-10-CM | POA: Diagnosis not present

## 2015-08-06 DIAGNOSIS — G5690 Unspecified mononeuropathy of unspecified upper limb: Secondary | ICD-10-CM | POA: Diagnosis not present

## 2015-08-06 DIAGNOSIS — I471 Supraventricular tachycardia, unspecified: Secondary | ICD-10-CM

## 2015-08-06 DIAGNOSIS — R609 Edema, unspecified: Secondary | ICD-10-CM | POA: Diagnosis not present

## 2015-08-06 DIAGNOSIS — L97909 Non-pressure chronic ulcer of unspecified part of unspecified lower leg with unspecified severity: Secondary | ICD-10-CM

## 2015-08-06 DIAGNOSIS — E876 Hypokalemia: Secondary | ICD-10-CM

## 2015-08-06 DIAGNOSIS — R600 Localized edema: Secondary | ICD-10-CM

## 2015-08-06 DIAGNOSIS — G8929 Other chronic pain: Secondary | ICD-10-CM

## 2015-08-06 DIAGNOSIS — C8590 Non-Hodgkin lymphoma, unspecified, unspecified site: Secondary | ICD-10-CM | POA: Diagnosis not present

## 2015-08-06 DIAGNOSIS — G5691 Unspecified mononeuropathy of right upper limb: Secondary | ICD-10-CM

## 2015-08-06 DIAGNOSIS — R569 Unspecified convulsions: Secondary | ICD-10-CM

## 2015-08-06 DIAGNOSIS — I83009 Varicose veins of unspecified lower extremity with ulcer of unspecified site: Secondary | ICD-10-CM

## 2015-08-06 DIAGNOSIS — F32A Depression, unspecified: Secondary | ICD-10-CM

## 2015-08-06 MED ORDER — FUROSEMIDE 40 MG PO TABS: ORAL_TABLET | ORAL | Status: DC

## 2015-08-06 MED ORDER — TRAZODONE HCL 50 MG PO TABS: ORAL_TABLET | ORAL | Status: DC

## 2015-08-06 MED ORDER — FENTANYL 75 MCG/HR TD PT72: 75.0000 ug | MEDICATED_PATCH | TRANSDERMAL | Status: DC

## 2015-08-06 MED ORDER — ATORVASTATIN CALCIUM 40 MG PO TABS: 40.0000 mg | ORAL_TABLET | Freq: Every day | ORAL | Status: DC

## 2015-08-06 MED ORDER — GABAPENTIN 300 MG PO CAPS: ORAL_CAPSULE | ORAL | Status: DC

## 2015-08-06 MED ORDER — FLUOXETINE HCL 40 MG PO CAPS: 80.0000 mg | ORAL_CAPSULE | Freq: Every day | ORAL | Status: DC

## 2015-08-06 MED ORDER — LEVETIRACETAM 500 MG PO TABS: ORAL_TABLET | ORAL | Status: DC

## 2015-08-06 NOTE — Progress Notes (Signed)
Subjective:    Patient ID: Angela Michael, female    DOB: 09-03-1960, 55 y.o.   MRN: [id number]   Patient here today for follow up of chronic medical problems.  Outpatient Encounter Prescriptions as of 08/06/2015  Medication Sig  . Ascorbic Acid (VITAMIN C) 1000 MG tablet Take 1,000 mg by mouth daily. Reported on 06/21/2015  . aspirin 81 MG tablet Take 81 mg by mouth daily. Reported on 06/21/2015  . atorvastatin (LIPITOR) 40 MG tablet Take 1 tablet (40 mg total) by mouth at bedtime.  . cyanocobalamin (,VITAMIN B-12,) 1000 MCG/ML injection Inject 1 mL (1,000 mcg total) into the muscle every 30 (thirty) days.  . Fe Fum-FePoly-Vit C-Vit B3 (INTEGRA) 62.5-62.5-40-3 MG CAPS TAKE ONE CAPSULE BY MOUTH EVERY DAY  . fentaNYL (DURAGESIC) 75 MCG/HR Place 1 patch (75 mcg total) onto the skin every 3 (three) days.  Marland Kitchen FLUoxetine (PROZAC) 40 MG capsule Take 2 capsules (80 mg total) by mouth daily.  . fluticasone (FLONASE) 50 MCG/ACT nasal spray PLACE 2 SPRAYS INTO THE NOSE AS NEEDED. FOR ALLERGIES  . furosemide (LASIX) 40 MG tablet TAKE 1 TABLET (40 MG TOTAL) BY MOUTH DAILY.  Marland Kitchen gabapentin (NEURONTIN) 300 MG capsule TAKE 2 CAPSULE BY MOUTH 2 TIMES DAILY  . HYDROcodone-acetaminophen (NORCO/VICODIN) 5-325 MG tablet Take 1 tablet by mouth 3 (three) times daily. Reported on 06/21/2015  . levETIRAcetam (KEPPRA) 500 MG tablet TAKE 2 TABLETS BY MOUTH 2 TIMES DAILY  . loperamide (IMODIUM A-D) 2 MG tablet Take 1 tablet (2 mg total) by mouth 4 (four) times daily as needed for diarrhea or loose stools (over the counter).  Marland Kitchen loratadine (CLARITIN) 10 MG tablet Take 10 mg by mouth as needed. For allergies  . Multiple Vitamin (MULTIVITAMIN WITH MINERALS) TABS Take 1 tablet by mouth daily. Reported on 06/21/2015  . nitroGLYCERIN (NITROSTAT) 0.4 MG SL tablet PLACE 1 TABLET (0.4 MG TOTAL) UNDER THE TONGUE EVERY 5 (FIVE) MINUTES AS NEEDED FOR CHEST PAIN.  Marland Kitchen Nutritional Supplements (ESTROVEN PMS) TABS Take 1 tablet by mouth daily.   Marland Kitchen  oxyCODONE (OXY IR/ROXICODONE) 5 MG immediate release tablet Take 5-10 mg by mouth.  . traZODone (DESYREL) 50 MG tablet TAKE 1 TO 3 TABLETS AT BEDTIME  . [DISCONTINUED] enoxaparin (LOVENOX) 40 MG/0.4ML injection Inject 0.4 mLs (40 mg total) into the skin daily.  . [DISCONTINUED] fentaNYL (DURAGESIC) 75 MCG/HR Place 1 patch (75 mcg total) onto the skin every 3 (three) days.  . [DISCONTINUED] mupirocin ointment (BACTROBAN) 2 % APPLY SMALL AMT INSIDE EACH NOSTRIL 2X DAILY FOR 5DAYS AFTER APPLYING, PRESS SIDES OF NOSE & MASSAGE  . [DISCONTINUED] warfarin (COUMADIN) 2 MG tablet Take 4 tablets (8 mg total) by mouth daily at 6 PM.   No facility-administered encounter medications on file as of 08/06/2015.      Hyperlipidemia This is a chronic problem. The current episode started more than 1 year ago. The problem is uncontrolled. Recent lipid tests were reviewed and are high. Pertinent negatives include no chest pain. Current antihyperlipidemic treatment includes statins. The current treatment provides moderate improvement of lipids. Compliance problems include adherence to diet and adherence to exercise.  Risk factors for coronary artery disease include post-menopausal, dyslipidemia and obesity.  Seizures Keppra working well no seizure in several years. Peripheral edema Lasix working well to keep edema under control. Hypokalemia K-Dur working well. No C/O lower ext aches Chronic right arm pain and neuropathy Duragesic patches makes pain tolerable- neurotin helps a lot Depression Prozac working well- has occassional bouts  of depression but nothing she can't handle Pancytopenia/anemia Needs anemia panel done today lymphoma Currently not having treatments- they are currently watching for change in size of area. Insomnia trazadone helps her to sleep at night systemic inflammatory response syndrome See rheumatologist- has chronic pain despite fentanyl patch  Pain assessment: Cause of pain- SIRS as  well as right shoulder neuropathy Pain location- all over worse in right shoulder Pain on scale of 1-10- 3/10 currently Frequency- What increases pain-daily What makes pain Better- heat and rest Effects on ADL - some days not ablet odo anything because of pain Any change in general medical condition- no  Current medications- fentanyl patch  And norco fro break through pain Effectiveness of current meds-some days good some days not Adverse reactions form pain meds-none  Pill count performed-No Urine drug screen- Yes    Review of Systems  Constitutional: Negative.   HENT: Negative.   Respiratory: Negative for cough, choking and chest tightness.   Cardiovascular: Negative for chest pain.  Gastrointestinal: Negative.   Endocrine: Negative.   Genitourinary: Negative.   Musculoskeletal: Negative.   Allergic/Immunologic: Negative.   Neurological: Negative.   Psychiatric/Behavioral: Negative.   All other systems reviewed and are negative.      Objective:   Physical Exam  Constitutional: She is oriented to person, place, and time. She appears well-developed and well-nourished.  HENT:  Nose: Nose normal.  Mouth/Throat: Oropharynx is clear and moist.  Eyes: EOM are normal.  Neck: Trachea normal, normal range of motion and full passive range of motion without pain. Neck supple. No JVD present. Carotid bruit is not present. No thyromegaly present.  Cardiovascular: Normal rate, normal heart sounds and intact distal pulses.  Exam reveals no gallop and no friction rub.   No murmur heard. Regular irregularity  Pulmonary/Chest: Effort normal and breath sounds normal.  Abdominal: Soft. Bowel sounds are normal. She exhibits no distension and no mass. There is no tenderness.  Musculoskeletal: Normal range of motion.  Improving ROM of left knee- scar healing without problems  Lymphadenopathy:    She has no cervical adenopathy.  Neurological: She is alert and oriented to person, place,  and time. She has normal reflexes.  Skin: Skin is warm and dry.  Psychiatric: She has a normal mood and affect. Her behavior is normal. Judgment and thought content normal.     BP 111/65 mmHg  Pulse 45  Temp(Src) 96.9 F (36.1 C) (Oral)  Ht 5' 1"  (1.549 m)  Wt 197 lb (89.359 kg)  BMI 37.24 kg/m2      Assessment & Plan:   1. SVT (supraventricular tachycardia) (HCC) Avoid caffeine  2. Neuropathy of upper extremity, right - fentaNYL (DURAGESIC) 75 MCG/HR; Place 1 patch (75 mcg total) onto the skin every 3 (three) days.  Dispense: 10 patch; Refill: 0  3. Pancytopenia ([place]) - Anemia Profile B  4. Depression Stress management - FLUoxetine (PROZAC) 40 MG capsule; Take 2 capsules (80 mg total) by mouth daily.  Dispense: 60 capsule; Refill: 5  5. Peripheral edema Elevated legs when sitting - furosemide (LASIX) 40 MG tablet; TAKE 1 TABLET (40 MG TOTAL) BY MOUTH DAILY.  Dispense: 30 tablet; Refill: 5  6. Hyperlipidemia with target LDL less than 100 Low fata diet - atorvastatin (LIPITOR) 40 MG tablet; Take 1 tablet (40 mg total) by mouth at bedtime.  Dispense: 30 tablet; Refill: 5 - CMP14+EGFR - Lipid panel  7. Hypokalemia 8. Seizures ([place])  9. Non-Hodgkin's lymphoma, unspecified body region, unspecified non-Hodgkin lymphoma  type [facility]) Continue follow  Up with oncology  10. Neuropathy of upper extremity, unspecified laterality - gabapentin (NEURONTIN) 300 MG capsule; TAKE 2 CAPSULE BY MOUTH 2 TIMES DAILY  Dispense: 120 capsule; Refill: 5  11. Varicose veins of lower extremities with ulcer, unspecified laterality (HCC) - levETIRAcetam (KEPPRA) 500 MG tablet; TAKE 2 TABLETS BY MOUTH 2 TIMES DAILY  Dispense: 120 tablet; Refill: 5  12. Encounter for chronic pain management - ToxASSURE Select 13 (MW), Urine  13. Insomnia Bedtime ritual - traZODone (DESYREL) 50 MG tablet; TAKE 1 TO 3 TABLETS AT BEDTIME  Dispense: 270 tablet; Refill: 1  14. Morbid obesity due to excess  calories ([place]) Discussed diet and exercise for person with BMI >25 Will recheck weight in 3-6 months    Labs pending Health maintenance reviewed Diet and exercise encouraged Continue all meds Follow up  In 3 month   [place], FNP

## 2015-08-07 LAB — CMP14+EGFR
ALBUMIN: 3.9 g/dL (ref 3.5–5.5)
ALT: 20 IU/L (ref 0–32)
AST: 28 IU/L (ref 0–40)
Albumin/Globulin Ratio: 1.6 (ref 1.2–2.2)
Alkaline Phosphatase: 129 IU/L — ABNORMAL HIGH (ref 39–117)
BUN / CREAT RATIO: 19 (ref 9–23)
BUN: 14 mg/dL (ref 6–24)
Bilirubin Total: 0.6 mg/dL (ref 0.0–1.2)
CALCIUM: 9.1 mg/dL (ref 8.7–10.2)
CO2: 28 mmol/L (ref 18–29)
CREATININE: 0.72 mg/dL (ref 0.57–1.00)
Chloride: 97 mmol/L (ref 96–106)
GFR calc Af Amer: 111 mL/min/{1.73_m2} (ref >59)
GFR, EST NON AFRICAN AMERICAN: 96 mL/min/{1.73_m2} (ref >59)
GLOBULIN, TOTAL: 2.4 g/dL (ref 1.5–4.5)
Glucose: 71 mg/dL (ref 65–99)
Potassium: 4.2 mmol/L (ref 3.5–5.2)
SODIUM: 140 mmol/L (ref 134–144)
TOTAL PROTEIN: 6.3 g/dL (ref 6.0–8.5)

## 2015-08-07 LAB — ANEMIA PROFILE B
BASOS: 0 %
Basophils Absolute: 0 10*3/uL (ref 0.0–0.2)
EOS (ABSOLUTE): 0.1 10*3/uL (ref 0.0–0.4)
EOS: 3 %
FERRITIN: 246 ng/mL — AB (ref 15–150)
Folate: 18.1 ng/mL (ref >3.0)
HEMATOCRIT: 34.5 % (ref 34.0–46.6)
HEMOGLOBIN: 11.6 g/dL (ref 11.1–15.9)
IMMATURE GRANS (ABS): 0 10*3/uL (ref 0.0–0.1)
IMMATURE GRANULOCYTES: 0 %
Iron Saturation: 30 % (ref 15–55)
Iron: 80 ug/dL (ref 27–159)
LYMPHS: 26 %
Lymphocytes Absolute: 1 10*3/uL (ref 0.7–3.1)
MCH: 31.4 pg (ref 26.6–33.0)
MCHC: 33.6 g/dL (ref 31.5–35.7)
MCV: 93 fL (ref 79–97)
MONOCYTES: 5 %
Monocytes Absolute: 0.2 10*3/uL (ref 0.1–0.9)
NEUTROS PCT: 66 %
Neutrophils Absolute: 2.5 10*3/uL (ref 1.4–7.0)
Platelets: 140 10*3/uL — ABNORMAL LOW (ref 150–379)
RBC: 3.7 x10E6/uL — AB (ref 3.77–5.28)
RDW: 14.5 % (ref 12.3–15.4)
RETIC CT PCT: 2.2 % (ref 0.6–2.6)
Total Iron Binding Capacity: 270 ug/dL (ref 250–450)
UIBC: 190 ug/dL (ref 131–425)
Vitamin B-12: 458 pg/mL (ref 211–946)
WBC: 3.8 10*3/uL (ref 3.4–10.8)

## 2015-08-07 LAB — LIPID PANEL
CHOL/HDL RATIO: 2.1 ratio (ref 0.0–4.4)
Cholesterol, Total: 137 mg/dL (ref 100–199)
HDL: 64 mg/dL (ref >39)
LDL CALC: 57 mg/dL (ref 0–99)
Triglycerides: 82 mg/dL (ref 0–149)
VLDL Cholesterol Cal: 16 mg/dL (ref 5–40)

## 2015-08-10 LAB — TOXASSURE SELECT 13 (MW), URINE: PDF: 0

## 2015-09-25 ENCOUNTER — Telehealth: Payer: Self-pay | Admitting: Nurse Practitioner

## 2015-09-25 DIAGNOSIS — G5691 Unspecified mononeuropathy of right upper limb: Secondary | ICD-10-CM

## 2015-09-25 MED ORDER — FENTANYL 75 MCG/HR TD PT72: 75.0000 ug | MEDICATED_PATCH | TRANSDERMAL | Status: DC

## 2015-09-25 NOTE — Telephone Encounter (Signed)
Pt needs appt for pain contract with [facility]. RX ready for pick up

## 2015-09-26 NOTE — Telephone Encounter (Signed)
Please call our office to schedule an appointment for pain contract. Script for patch is ready at our front office.

## 2015-10-18 ENCOUNTER — Ambulatory Visit (INDEPENDENT_AMBULATORY_CARE_PROVIDER_SITE_OTHER): Payer: Medicaid Other | Admitting: Nurse Practitioner

## 2015-10-18 ENCOUNTER — Encounter: Payer: Self-pay | Admitting: Nurse Practitioner

## 2015-10-18 VITALS — BP 100/69 | HR 61 | Temp 97.0°F | Ht 61.0 in | Wt 190.6 lb

## 2015-10-18 DIAGNOSIS — G5691 Unspecified mononeuropathy of right upper limb: Secondary | ICD-10-CM | POA: Diagnosis not present

## 2015-10-18 DIAGNOSIS — Z7189 Other specified counseling: Secondary | ICD-10-CM | POA: Diagnosis not present

## 2015-10-18 DIAGNOSIS — G8929 Other chronic pain: Secondary | ICD-10-CM

## 2015-10-18 MED ORDER — FENTANYL 75 MCG/HR TD PT72: 75.0000 ug | MEDICATED_PATCH | TRANSDERMAL | Status: DC

## 2015-10-18 NOTE — Progress Notes (Signed)
Subjective:    Patient ID: Angela Michael, female    DOB: December 01, 1960, 55 y.o.   MRN: [id number]  HPI  [place] Controlled Substance Abuse database reviewed- Yes  Depression screen [facility] 2/9 10/18/2015 08/06/2015 03/01/2015 01/18/2015 10/10/2014  Decreased Interest 0 1 3 2  0  Down, Depressed, Hopeless 0 1 1 1  0  PHQ - 2 Score 0 2 4 3  0  Altered sleeping - 0 1 0 -  Tired, decreased energy - 1 2 2  -  Change in appetite - 0 1 2 -  Feeling bad or failure about yourself  - 0 0 0 -  Trouble concentrating - 0 0 0 -  Moving slowly or fidgety/restless - 0 0 0 -  Suicidal thoughts - 0 0 0 -  PHQ-9 Score - 3 8 7  -    No flowsheet data found.     [employer] drug screen performed- Yes  SOAPP  0= never  1= seldom  2=sometimes  3= often  4= very often  How often do you have mood swings? 0 How often do you smoke a cigarette within an hour after waling up? 0 How often have you taken medication other than the way that it was prescribed?0 How often have you used illegal drugs in the past 5 years? 0 How often, in your lifetime, have you had legal problems or been arrested? 0  Score 0  Alcohol Audit - How often during the last year have found that you: 0-Never   1- Less than monthly   2- Monthly     3-Weekly     4-daily or almost daily  - found that you were not able to stop drinking once you started- 0 -failed to do what was normally expected of you because of drinking- 0 -needed a first drink in the morning- 0 -had a feeling of guilt or remorse after drinking- 0 -are/were unable to remember what happened the night before because of your drinking- 0  0- NO   2- yes but not in last year  4- yes during last year -Have you or someone else been injured because of your drinking- 0 - Has anyone been concerned about your drinking or suggested you cut down- 0        TOTAL- 0  ( 0-7- alcohol education, 8-15- simple advice, 16-19 simple advice plus counseling, 20-40 referral for evaluation  and treatment 0   Designated Pharmacy- [facility]  Pain assessment: Cause of pain- 1- shoulder injury  2- osteoarthritis Pain location- 1- right shoulder neuropathy  2- left TKR in Feb 2017 Pain on scale of 1-10- 1- 5/10  2- 7/10 Frequency- 1- daily  2- daily but tolerable without pain meds some days What increases pain- 1- nothing  2- activity What makes pain Better-1- pain meds help  2- walking Effects on ADL - some days unable to do much but can usually get ADL's done  Prior treatments tried and failed- 1- nothing  2- TKR Current treatments- 1- Fentanyl patch 50mcg  2- hydrocodone 5/325 TID ( patient was only taking as needed.  * [facility] management [facility]- for knee pain only- they did drug screen and she had no hydrocodone in system so they would not refill rx- Has next appointment November 15, 2015. We will not do hydrocodone rx. Pain management agreement reviewed and signed- Yes     Review of Systems  Constitutional: Negative.   HENT: Negative.   Respiratory: Negative.   Cardiovascular: Negative.  Genitourinary: Negative.   Neurological: Negative.   Psychiatric/Behavioral: Negative.   All other systems reviewed and are negative.      Objective:   Physical Exam  Constitutional: She is oriented to person, place, and time. She appears well-developed and well-nourished. No distress.  Cardiovascular: Normal rate, regular rhythm and normal heart sounds.   Pulmonary/Chest: Effort normal and breath sounds normal.  Musculoskeletal:  FROM of right shoulder with pain on abduction and internal rotation. Grips equal bil   Neurological: She is alert and oriented to person, place, and time.  Skin: Skin is warm.  Psychiatric: She has a normal mood and affect. Her behavior is normal. Judgment and thought content normal.    BP 100/69 mmHg  Pulse 61  Temp(Src) 97 F (36.1 C) (Oral)  Ht 5\' 1"  (1.549 m)  Wt 190 lb 9.6 oz (86.456 kg)  BMI 36.03 kg/m2      Assessment  & Plan:   1. Encounter for chronic pain management   2. Neuropathy of upper extremity, right    Meds ordered this encounter  Medications  . fentaNYL (DURAGESIC) 75 MCG/HR    Sig: Place 1 patch (75 mcg total) onto the skin every 3 (three) days.    Dispense:  10 patch    Refill:  0    Order Specific Question:  Supervising Provider    Answer:  VINCENT, CAROL L [[id number]]  . fentaNYL (DURAGESIC) 75 MCG/HR    Sig: Place 1 patch (75 mcg total) onto the skin every 3 (three) days.    Dispense:  5 patch    Refill:  0    DO NOT FILL TILL 11/18/15    Order Specific Question:  Supervising Provider    Answer:  Assunta Found L [[id number]]  . fentaNYL (DURAGESIC) 75 MCG/HR    Sig: Place 1 patch (75 mcg total) onto the skin every 3 (three) days.    Dispense:  5 patch    Refill:  0    DO NOT FILL TILL 12/17/15    Order Specific Question:  Supervising Provider    Answer:  Eustaquio Maize [[id number]]   Pain contract signed Follow up in 3 months RTO prn  Mary-Margaret Hassell Done, FNP

## 2015-11-01 ENCOUNTER — Other Ambulatory Visit: Payer: Self-pay | Admitting: Nurse Practitioner

## 2015-11-01 DIAGNOSIS — G5691 Unspecified mononeuropathy of right upper limb: Secondary | ICD-10-CM

## 2015-11-01 MED ORDER — FENTANYL 75 MCG/HR TD PT72: 75.0000 ug | MEDICATED_PATCH | TRANSDERMAL | Status: DC

## 2015-11-05 ENCOUNTER — Encounter: Payer: Self-pay | Admitting: Nurse Practitioner

## 2015-11-05 ENCOUNTER — Ambulatory Visit (INDEPENDENT_AMBULATORY_CARE_PROVIDER_SITE_OTHER): Payer: Medicaid Other | Admitting: Nurse Practitioner

## 2015-11-05 VITALS — BP 85/59 | HR 51 | Temp 97.1°F | Ht 61.0 in | Wt 198.0 lb

## 2015-11-05 DIAGNOSIS — R569 Unspecified convulsions: Secondary | ICD-10-CM | POA: Diagnosis not present

## 2015-11-05 DIAGNOSIS — E876 Hypokalemia: Secondary | ICD-10-CM | POA: Diagnosis not present

## 2015-11-05 DIAGNOSIS — C8590 Non-Hodgkin lymphoma, unspecified, unspecified site: Secondary | ICD-10-CM | POA: Diagnosis not present

## 2015-11-05 DIAGNOSIS — G5691 Unspecified mononeuropathy of right upper limb: Secondary | ICD-10-CM

## 2015-11-05 DIAGNOSIS — G4700 Insomnia, unspecified: Secondary | ICD-10-CM | POA: Insufficient documentation

## 2015-11-05 DIAGNOSIS — L97909 Non-pressure chronic ulcer of unspecified part of unspecified lower leg with unspecified severity: Secondary | ICD-10-CM

## 2015-11-05 DIAGNOSIS — F32A Depression, unspecified: Secondary | ICD-10-CM

## 2015-11-05 DIAGNOSIS — E785 Hyperlipidemia, unspecified: Secondary | ICD-10-CM

## 2015-11-05 DIAGNOSIS — I471 Supraventricular tachycardia: Secondary | ICD-10-CM | POA: Diagnosis not present

## 2015-11-05 DIAGNOSIS — G5690 Unspecified mononeuropathy of unspecified upper limb: Secondary | ICD-10-CM

## 2015-11-05 DIAGNOSIS — F329 Major depressive disorder, single episode, unspecified: Secondary | ICD-10-CM

## 2015-11-05 DIAGNOSIS — D510 Vitamin B12 deficiency anemia due to intrinsic factor deficiency: Secondary | ICD-10-CM | POA: Diagnosis not present

## 2015-11-05 DIAGNOSIS — I83009 Varicose veins of unspecified lower extremity with ulcer of unspecified site: Secondary | ICD-10-CM

## 2015-11-05 DIAGNOSIS — R609 Edema, unspecified: Secondary | ICD-10-CM

## 2015-11-05 MED ORDER — GABAPENTIN 300 MG PO CAPS: ORAL_CAPSULE | ORAL | 5 refills | Status: DC

## 2015-11-05 MED ORDER — TRAZODONE HCL 50 MG PO TABS: ORAL_TABLET | ORAL | 1 refills | Status: DC

## 2015-11-05 MED ORDER — FLUOXETINE HCL 40 MG PO CAPS: 80.0000 mg | ORAL_CAPSULE | Freq: Every day | ORAL | 5 refills | Status: DC

## 2015-11-05 MED ORDER — LEVETIRACETAM 500 MG PO TABS: ORAL_TABLET | ORAL | 5 refills | Status: DC

## 2015-11-05 MED ORDER — FUROSEMIDE 40 MG PO TABS: ORAL_TABLET | ORAL | 5 refills | Status: DC

## 2015-11-05 MED ORDER — ATORVASTATIN CALCIUM 40 MG PO TABS: 40.0000 mg | ORAL_TABLET | Freq: Every day | ORAL | 5 refills | Status: DC

## 2015-11-05 NOTE — Patient Instructions (Signed)
Insomnia Insomnia is a sleep disorder that makes it difficult to fall asleep or to stay asleep. Insomnia can cause tiredness (fatigue), low energy, difficulty concentrating, mood swings, and poor performance at work or school.  There are three different ways to classify insomnia:  Difficulty falling asleep.  Difficulty staying asleep.  Waking up too early in the morning. Any type of insomnia can be long-term (chronic) or short-term (acute). Both are common. Short-term insomnia usually lasts for three months or less. Chronic insomnia occurs at least three times a week for longer than three months. CAUSES  Insomnia may be caused by another condition, situation, or substance, such as:  Anxiety.  Certain medicines.  Gastroesophageal reflux disease (GERD) or other gastrointestinal conditions.  Asthma or other breathing conditions.  Restless legs syndrome, sleep apnea, or other sleep disorders.  Chronic pain.  Menopause. This may include hot flashes.  Stroke.  Abuse of alcohol, tobacco, or illegal drugs.  Depression.  Caffeine.   Neurological disorders, such as Alzheimer disease.  An overactive thyroid (hyperthyroidism). The cause of insomnia may not be known. RISK FACTORS Risk factors for insomnia include:  Gender. Women are more commonly affected than men.  Age. Insomnia is more common as you get older.  Stress. This may involve your professional or personal life.  Income. Insomnia is more common in people with lower income.  Lack of exercise.   Irregular work schedule or night shifts.  Traveling between different time zones. SIGNS AND SYMPTOMS If you have insomnia, trouble falling asleep or trouble staying asleep is the main symptom. This may lead to other symptoms, such as:  Feeling fatigued.  Feeling nervous about going to sleep.  Not feeling rested in the morning.  Having trouble concentrating.  Feeling irritable, anxious, or depressed. TREATMENT   Treatment for insomnia depends on the cause. If your insomnia is caused by an underlying condition, treatment will focus on addressing the condition. Treatment may also include:   Medicines to help you sleep.  Counseling or therapy.  Lifestyle adjustments. HOME CARE INSTRUCTIONS   Take medicines only as directed by your health care provider.  Keep regular sleeping and waking hours. Avoid naps.  Keep a sleep diary to help you and your health care provider figure out what could be causing your insomnia. Include:   When you sleep.  When you wake up during the night.  How well you sleep.   How rested you feel the next day.  Any side effects of medicines you are taking.  What you eat and drink.   Make your bedroom a comfortable place where it is easy to fall asleep:  Put up shades or special blackout curtains to block light from outside.  Use a white noise machine to block noise.  Keep the temperature cool.   Exercise regularly as directed by your health care provider. Avoid exercising right before bedtime.  Use relaxation techniques to manage stress. Ask your health care provider to suggest some techniques that may work well for you. These may include:  Breathing exercises.  Routines to release muscle tension.  Visualizing peaceful scenes.  Cut back on alcohol, caffeinated beverages, and cigarettes, especially close to bedtime. These can disrupt your sleep.  Do not overeat or eat spicy foods right before bedtime. This can lead to digestive discomfort that can make it hard for you to sleep.  Limit screen use before bedtime. This includes:  Watching TV.  Using your smartphone, tablet, and computer.  Stick to a routine. This   can help you fall asleep faster. Try to do a quiet activity, brush your teeth, and go to bed at the same time each night.  Get out of bed if you are still awake after 15 minutes of trying to sleep. Keep the lights down, but try reading or  doing a quiet activity. When you feel sleepy, go back to bed.  Make sure that you drive carefully. Avoid driving if you feel very sleepy.  Keep all follow-up appointments as directed by your health care provider. This is important. SEEK MEDICAL CARE IF:   You are tired throughout the day or have trouble in your daily routine due to sleepiness.  You continue to have sleep problems or your sleep problems get worse. SEEK IMMEDIATE MEDICAL CARE IF:   You have serious thoughts about hurting yourself or someone else.   This information is not intended to replace advice given to you by your health care provider. Make sure you discuss any questions you have with your health care provider.   Document Released: 03/28/2000 Document Revised: 12/20/2014 Document Reviewed: 12/30/2013 Elsevier Interactive Patient Education 2016 [employer]  

## 2015-11-05 NOTE — Progress Notes (Signed)
Subjective:    Patient ID: Angela Michael, female    DOB: 10-15-1960, 55 y.o.   MRN: [id number]   Patient here today for follow up of chronic medical problems.  Outpatient Encounter Prescriptions as of 11/05/2015  Medication Sig  . Ascorbic Acid (VITAMIN C) 1000 MG tablet Take 1,000 mg by mouth daily. Reported on 06/21/2015  . aspirin 81 MG tablet Take 81 mg by mouth daily. Reported on 06/21/2015  . atorvastatin (LIPITOR) 40 MG tablet Take 1 tablet (40 mg total) by mouth at bedtime.  . cyanocobalamin (,VITAMIN B-12,) 1000 MCG/ML injection Inject 1 mL (1,000 mcg total) into the muscle every 30 (thirty) days.  . Fe Fum-FePoly-Vit C-Vit B3 (INTEGRA) 62.5-62.5-40-3 MG CAPS TAKE ONE CAPSULE BY MOUTH EVERY DAY  . fentaNYL (DURAGESIC) 75 MCG/HR Place 1 patch (75 mcg total) onto the skin every 3 (three) days.  Marland Kitchen FLUoxetine (PROZAC) 40 MG capsule Take 2 capsules (80 mg total) by mouth daily.  . fluticasone (FLONASE) 50 MCG/ACT nasal spray PLACE 2 SPRAYS INTO THE NOSE AS NEEDED. FOR ALLERGIES  . furosemide (LASIX) 40 MG tablet TAKE 1 TABLET (40 MG TOTAL) BY MOUTH DAILY.  Marland Kitchen gabapentin (NEURONTIN) 300 MG capsule TAKE 2 CAPSULE BY MOUTH 2 TIMES DAILY  . levETIRAcetam (KEPPRA) 500 MG tablet TAKE 2 TABLETS BY MOUTH 2 TIMES DAILY  . loratadine (CLARITIN) 10 MG tablet Take 10 mg by mouth as needed. For allergies  . Multiple Vitamin (MULTIVITAMIN WITH MINERALS) TABS Take 1 tablet by mouth daily. Reported on 06/21/2015  . nitroGLYCERIN (NITROSTAT) 0.4 MG SL tablet PLACE 1 TABLET (0.4 MG TOTAL) UNDER THE TONGUE EVERY 5 (FIVE) MINUTES AS NEEDED FOR CHEST PAIN.  Marland Kitchen Nutritional Supplements (ESTROVEN PMS) TABS Take 1 tablet by mouth daily.   . traZODone (DESYREL) 50 MG tablet TAKE 1 TO 3 TABLETS AT BEDTIME  . [DISCONTINUED] atorvastatin (LIPITOR) 40 MG tablet Take 1 tablet (40 mg total) by mouth at bedtime.  . [DISCONTINUED] FLUoxetine (PROZAC) 40 MG capsule Take 2 capsules (80 mg total) by mouth daily.  . [DISCONTINUED]  furosemide (LASIX) 40 MG tablet TAKE 1 TABLET (40 MG TOTAL) BY MOUTH DAILY.  . [DISCONTINUED] gabapentin (NEURONTIN) 300 MG capsule TAKE 2 CAPSULE BY MOUTH 2 TIMES DAILY  . [DISCONTINUED] levETIRAcetam (KEPPRA) 500 MG tablet TAKE 2 TABLETS BY MOUTH 2 TIMES DAILY  . [DISCONTINUED] traZODone (DESYREL) 50 MG tablet TAKE 1 TO 3 TABLETS AT BEDTIME  . fentaNYL (DURAGESIC) 75 MCG/HR Place 1 patch (75 mcg total) onto the skin every 3 (three) days. (Patient not taking: Reported on 11/05/2015)  . fentaNYL (DURAGESIC) 75 MCG/HR Place 1 patch (75 mcg total) onto the skin every 3 (three) days. (Patient not taking: Reported on 11/05/2015)   No facility-administered encounter medications on file as of 11/05/2015.       Patient here today for follow up of chronic medical problems.  Outpatient Encounter Prescriptions as of 11/05/2015: Ascorbic Acid (VITAMIN C) 1000 MG tablet, Take 1,000 mg by mouth daily. Reported on 06/21/2015 aspirin 81 MG tablet, Take 81 mg by mouth daily. Reported on 06/21/2015 atorvastatin (LIPITOR) 40 MG tablet, Take 1 tablet (40 mg total) by mouth at bedtime. cyanocobalamin (,VITAMIN B-12,) 1000 MCG/ML injection, Inject 1 mL (1,000 mcg total) into the muscle every 30 (thirty) days. Fe Fum-FePoly-Vit C-Vit B3 (INTEGRA) 62.5-62.5-40-3 MG CAPS, TAKE ONE CAPSULE BY MOUTH EVERY DAY fentaNYL (DURAGESIC) 75 MCG/HR, Place 1 patch (75 mcg total) onto the skin every 3 (three) days. FLUoxetine (PROZAC) 40 MG capsule,  Take 2 capsules (80 mg total) by mouth daily. fluticasone (FLONASE) 50 MCG/ACT nasal spray, PLACE 2 SPRAYS INTO THE NOSE AS NEEDED. FOR ALLERGIES furosemide (LASIX) 40 MG tablet, TAKE 1 TABLET (40 MG TOTAL) BY MOUTH DAILY.  gabapentin (NEURONTIN) 300 MG capsule, TAKE 2 CAPSULE BY MOUTH 2 TIMES DAILY  levETIRAcetam (KEPPRA) 500 MG tablet, TAKE 2 TABLETS BY MOUTH 2 TIMES DAILY loratadine (CLARITIN) 10 MG tablet, Take 10 mg by mouth as needed. For allergies Multiple Vitamin (MULTIVITAMIN WITH  MINERALS) TABS, Take 1 tablet by mouth daily. Reported on 06/21/2015 nitroGLYCERIN (NITROSTAT) 0.4 MG SL tablet, PLACE 1 TABLET (0.4 MG TOTAL) UNDER THE TONGUE EVERY 5 (FIVE) MINUTES AS NEEDED FOR CHEST PAIN. Nutritional Supplements (ESTROVEN PMS) TABS, Take 1 tablet by mouth daily.  traZODone (DESYREL) 50 MG tablet, TAKE 1 TO 3 TABLETS AT BEDTIME    Hyperlipidemia  This is a chronic problem. The current episode started more than 1 year ago. The problem is uncontrolled. Recent lipid tests were reviewed and are high. Pertinent negatives include no chest pain. Current antihyperlipidemic treatment includes statins. The current treatment provides moderate improvement of lipids. Compliance problems include adherence to diet and adherence to exercise.  Risk factors for coronary artery disease include post-menopausal, dyslipidemia and obesity.  Seizures Keppra working well no seizure in several years. Peripheral edema Lasix working well to keep edema under control. Hypokalemia K-Dur working well. No C/O lower ext aches Chronic right arm pain and neuropathy Duragesic patches makes pain tolerable- neurotin helps a lot. SHe was seen for pain management the first of July. Depression Prozac working well- has occassional bouts of depression but nothing she can't handle does not want o try any new nedication. Pancytopenia/anemia Needs anemia panel done today lymphoma Currently not having treatments- they are currently watching for change in size of area. They say  Sh eis in remision- would like to have labs done today. Insomnia trazadone helps her to sleep at night systemic inflammatory response syndrome See rheumatologist- has chronic pain despite fentanyl patch- goes  to pain management for hydrocodone but they do not do her fentanyl patch- they are aware that she is taking.     Review of Systems  Constitutional: Negative.   HENT: Negative.   Respiratory: Negative for cough, choking and chest  tightness.   Cardiovascular: Negative for chest pain.  Gastrointestinal: Negative.   Endocrine: Negative.   Genitourinary: Negative.   Musculoskeletal: Negative.   Allergic/Immunologic: Negative.   Neurological: Negative.   Psychiatric/Behavioral: Negative.   All other systems reviewed and are negative.      Objective:   Physical Exam  Constitutional: She is oriented to person, place, and time. She appears well-developed and well-nourished.  HENT:  Nose: Nose normal.  Mouth/Throat: Oropharynx is clear and moist.  Eyes: EOM are normal.  Neck: Trachea normal, normal range of motion and full passive range of motion without pain. Neck supple. No JVD present. Carotid bruit is not present. No thyromegaly present.  Cardiovascular: Normal rate, normal heart sounds and intact distal pulses.  Exam reveals no gallop and no friction rub.   No murmur heard. Regular irregularity  Pulmonary/Chest: Effort normal and breath sounds normal.  Abdominal: Soft. Bowel sounds are normal. She exhibits no distension and no mass. There is no tenderness.  Musculoskeletal: Normal range of motion.  Improving ROM of left knee- scar healing without problems  Lymphadenopathy:    She has no cervical adenopathy.  Neurological: She is alert and oriented to person, place, and  time. She has normal reflexes.  Skin: Skin is warm and dry.  Psychiatric: She has a normal mood and affect. Her behavior is normal. Judgment and thought content normal.     BP (!) 85/59 (BP Location: Left Arm)   Pulse (!) 51   Temp 97.1 F (36.2 C) (Oral)   Ht 5' 1"  (1.549 m)   Wt 198 lb (89.8 kg)   BMI 37.41 kg/m       Assessment & Plan:   1. Hyperlipidemia with target LDL less than 100 Low fta diet encourgaed - atorvastatin (LIPITOR) 40 MG tablet; Take 1 tablet (40 mg total) by mouth at bedtime.  Dispense: 30 tablet; Refill: 5 - CMP14+EGFR - Lipid panel  2. Non-Hodgkin's lymphoma, unspecified body region, unspecified  non-Hodgkin lymphoma type [facility]) Keep follow up with oncology as needed  3. SVT (supraventricular tachycardia) ([place]) Keep cardiology appointments  4. Neuropathy of upper extremity, right Continue fentanyl patch  5. Depression stress management - FLUoxetine (PROZAC) 40 MG capsule; Take 2 capsules (80 mg total) by mouth daily.  Dispense: 60 capsule; Refill: 5  6. Peripheral edema - furosemide (LASIX) 40 MG tablet; TAKE 1 TABLET (40 MG TOTAL) BY MOUTH DAILY.  Dispense: 30 tablet; Refill: 5  7. Vitamin B12 deficiency anemia due to intrinsic factor deficiency  8. Morbid obesity due to excess calories ([place]) Discussed diet and exercise for person with BMI >25 Will recheck weight in 3-6 months  9. Hypokalemia  10. Seizures ([place]) keep follow up with neurologist  11. Insomnia Bedtime routine encourgaed - traZODone (DESYREL) 50 MG tablet; TAKE 1 TO 3 TABLETS AT BEDTIME  Dispense: 270 tablet; Refill: 1  12. Neuropathy of upper extremity, unspecified laterality - gabapentin (NEURONTIN) 300 MG capsule; TAKE 2 CAPSULE BY MOUTH 2 TIMES DAILY  Dispense: 120 capsule; Refill: 5  13. Varicose veins of lower extremities with ulcer, unspecified laterality ([facility]) - levETIRAcetam (KEPPRA) 500 MG tablet; TAKE 2 TABLETS BY MOUTH 2 TIMES DAILY  Dispense: 120 tablet; Refill: 5    Labs pending Health maintenance reviewed Diet and exercise encouraged Continue all meds Follow up  In 6 months   [place], FNP

## 2015-11-06 LAB — CBC WITH DIFFERENTIAL/PLATELET
Basophils Absolute: 0 10*3/uL (ref 0.0–0.2)
Basos: 1 %
EOS (ABSOLUTE): 0.1 10*3/uL (ref 0.0–0.4)
EOS: 3 %
Hematocrit: 37.8 % (ref 34.0–46.6)
Hemoglobin: 12.6 g/dL (ref 11.1–15.9)
IMMATURE GRANULOCYTES: 1 %
Immature Grans (Abs): 0 10*3/uL (ref 0.0–0.1)
LYMPHS: 21 %
Lymphocytes Absolute: 0.9 10*3/uL (ref 0.7–3.1)
MCH: 31.3 pg (ref 26.6–33.0)
MCHC: 33.3 g/dL (ref 31.5–35.7)
MCV: 94 fL (ref 79–97)
MONOCYTES: 7 %
MONOS ABS: 0.3 10*3/uL (ref 0.1–0.9)
NEUTROS PCT: 67 %
Neutrophils Absolute: 2.9 10*3/uL (ref 1.4–7.0)
Platelets: 120 10*3/uL — ABNORMAL LOW (ref 150–379)
RBC: 4.02 x10E6/uL (ref 3.77–5.28)
RDW: 14 % (ref 12.3–15.4)
WBC: 4.3 10*3/uL (ref 3.4–10.8)

## 2015-11-06 LAB — CMP14+EGFR
ALBUMIN: 3.7 g/dL (ref 3.5–5.5)
ALK PHOS: 112 IU/L (ref 39–117)
ALT: 26 IU/L (ref 0–32)
AST: 27 IU/L (ref 0–40)
Albumin/Globulin Ratio: 1.8 (ref 1.2–2.2)
BILIRUBIN TOTAL: 0.7 mg/dL (ref 0.0–1.2)
BUN / CREAT RATIO: 16 (ref 9–23)
BUN: 13 mg/dL (ref 6–24)
CHLORIDE: 101 mmol/L (ref 96–106)
CO2: 26 mmol/L (ref 18–29)
Calcium: 8.8 mg/dL (ref 8.7–10.2)
Creatinine, Ser: 0.83 mg/dL (ref 0.57–1.00)
GFR calc Af Amer: 93 mL/min/{1.73_m2} (ref >59)
GFR calc non Af Amer: 81 mL/min/{1.73_m2} (ref >59)
GLOBULIN, TOTAL: 2.1 g/dL (ref 1.5–4.5)
GLUCOSE: 76 mg/dL (ref 65–99)
POTASSIUM: 4.5 mmol/L (ref 3.5–5.2)
SODIUM: 142 mmol/L (ref 134–144)
Total Protein: 5.8 g/dL — ABNORMAL LOW (ref 6.0–8.5)

## 2015-11-06 LAB — LIPID PANEL
CHOLESTEROL TOTAL: 114 mg/dL (ref 100–199)
Chol/HDL Ratio: 2 ratio units (ref 0.0–4.4)
HDL: 56 mg/dL (ref >39)
LDL Calculated: 48 mg/dL (ref 0–99)
Triglycerides: 52 mg/dL (ref 0–149)
VLDL Cholesterol Cal: 10 mg/dL (ref 5–40)

## 2015-11-14 ENCOUNTER — Ambulatory Visit (INDEPENDENT_AMBULATORY_CARE_PROVIDER_SITE_OTHER): Payer: Medicaid Other

## 2015-11-14 ENCOUNTER — Other Ambulatory Visit: Payer: Self-pay | Admitting: Nurse Practitioner

## 2015-11-14 DIAGNOSIS — M25551 Pain in right hip: Secondary | ICD-10-CM

## 2015-11-14 DIAGNOSIS — M25552 Pain in left hip: Secondary | ICD-10-CM | POA: Diagnosis not present

## 2015-12-05 ENCOUNTER — Other Ambulatory Visit: Payer: Self-pay | Admitting: Nurse Practitioner

## 2015-12-21 LAB — HM DIABETES EYE EXAM

## 2016-01-03 IMAGING — XA IR IVC FILTER RETRIEVAL / S&I /IMG GUID/MOD SED
1 series · 12 of 13 positions shown · non-contrast
Comparison: CT 08/01/2014 and earlier studies

INDICATION: History of upper extremity DVT 9884. IVC filter placed 3998 as
prophylaxis during surgery. On long-term Xarelto. Filter removal is
requested. CT demonstrates filter tilt, with tip in the caval wall,
and caval wall perforation by filter legs. See consultation.

EXAM:
1. IR IVC FILTER RETRIEVAL/S+I/ IMAGE GUIDE MODERATE SEDATION
2. IR ULTRASOUND GUIDANCE VASC ACCESS
TECHNIQUE: Informed written consent was obtained from the patient after a
discussion of the risks, benefits and alternatives to treatment.
Questions regarding the procedure were encouraged and answered. A
timeout was performed prior to the initiation of the procedure.

[Series 1: run · 12 of 13 slices shown]
[im 1/13]
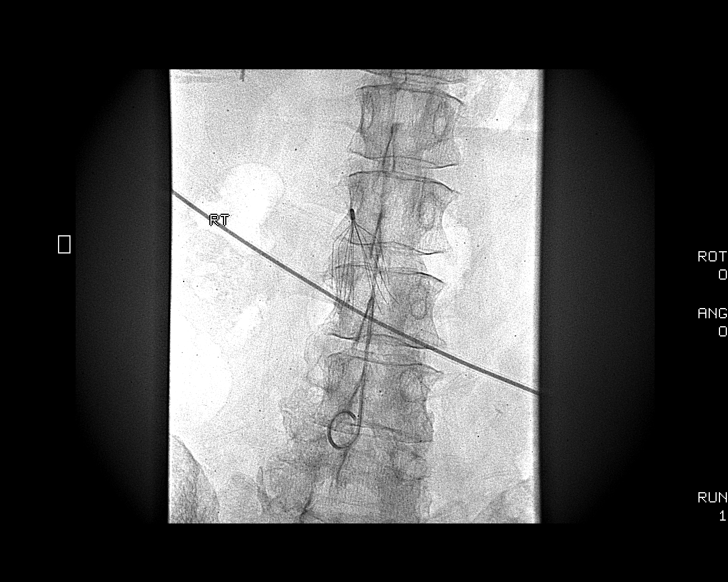
[im 2/13]
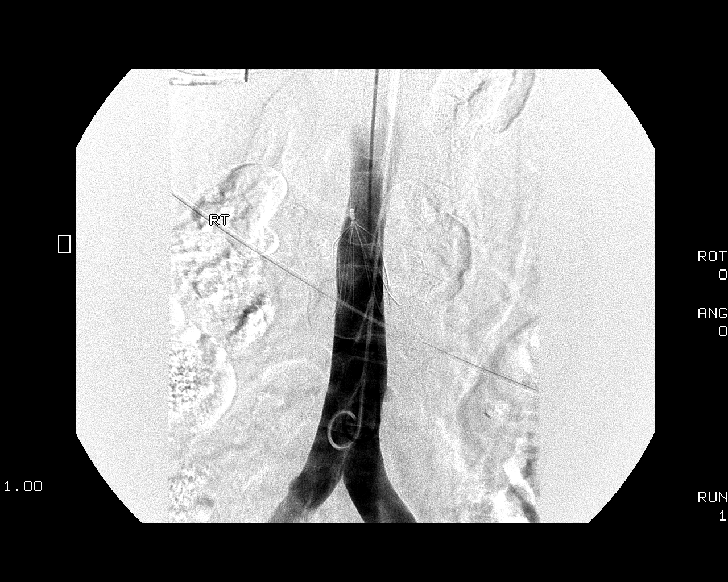
[im 3/13]
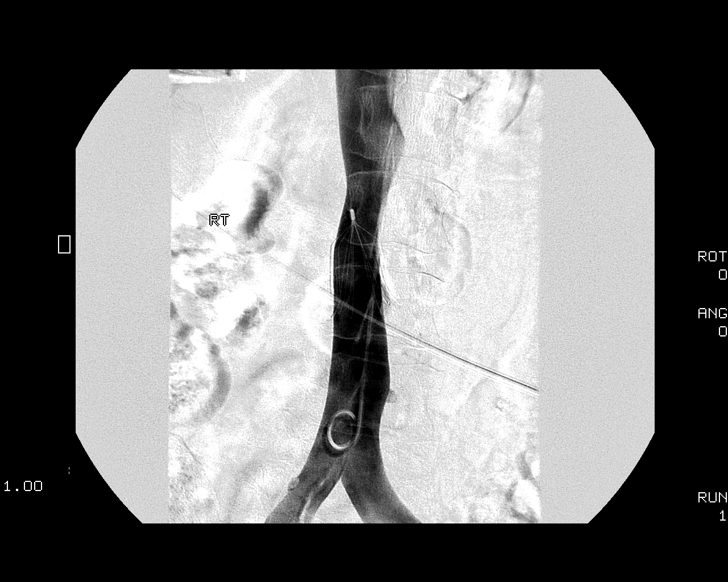
[im 4/13]
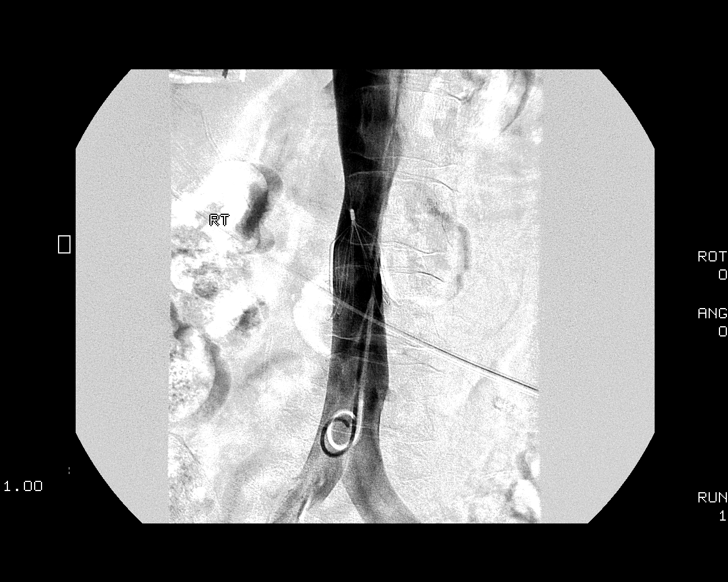
[im 5/13]
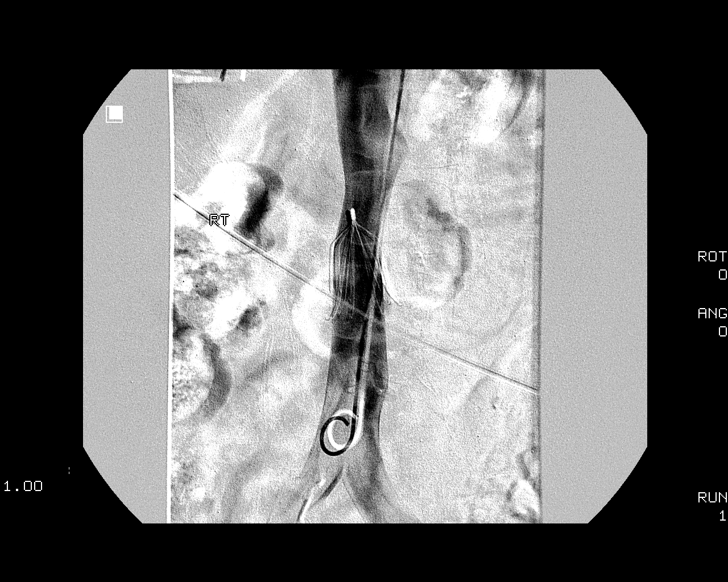
[im 6/13]
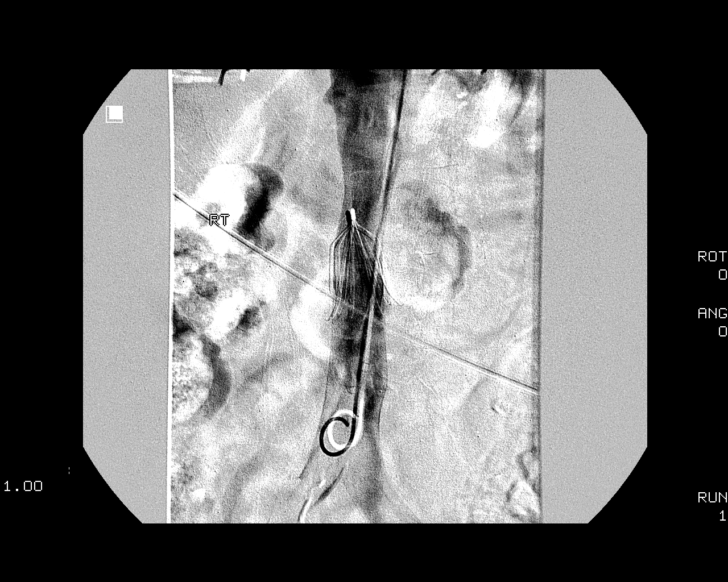
[im 8/13]
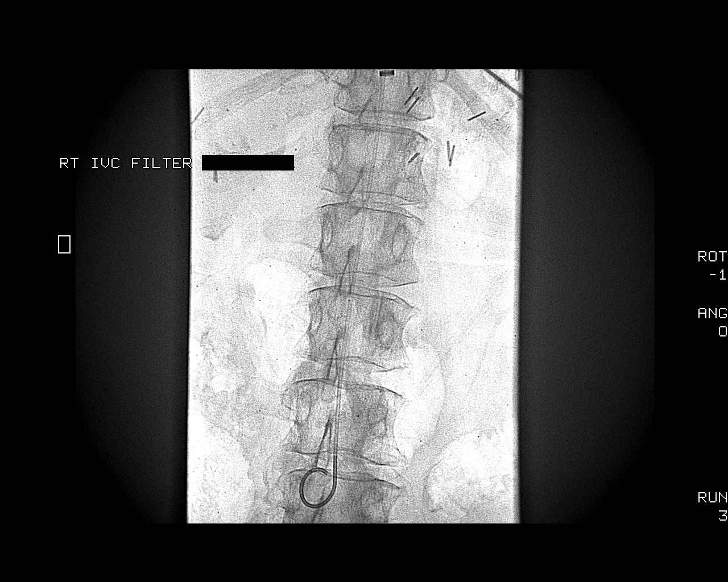
[im 9/13]
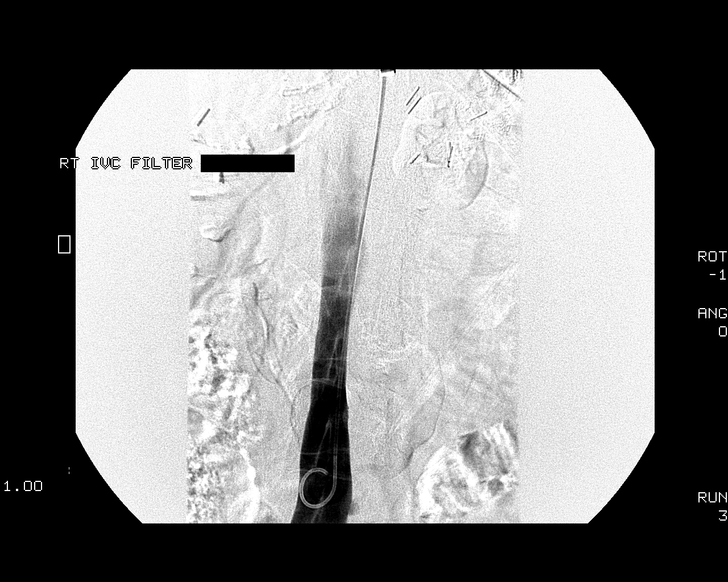
[im 10/13]
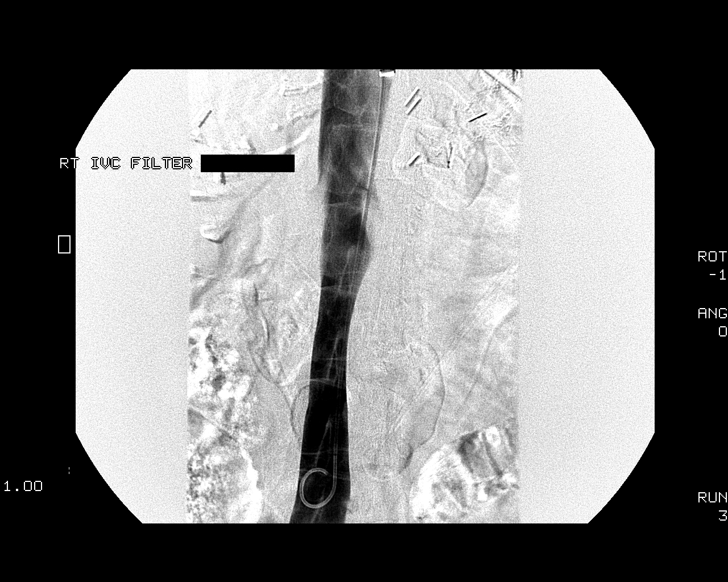
[im 11/13]
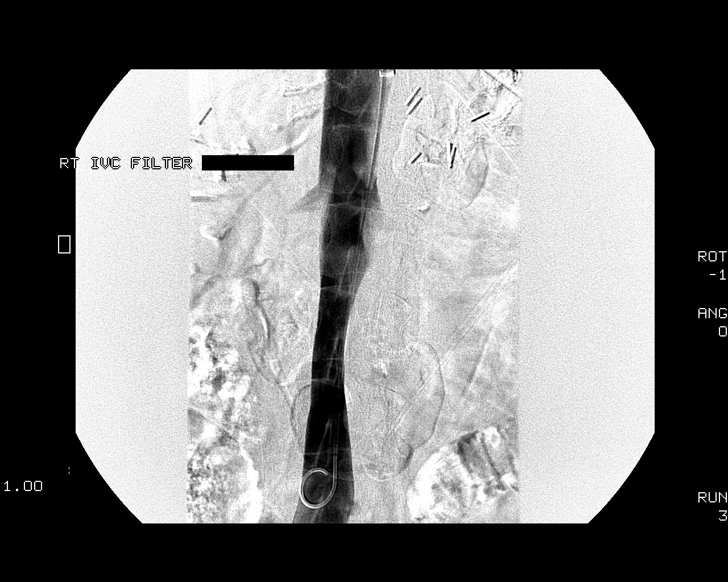
[im 12/13]
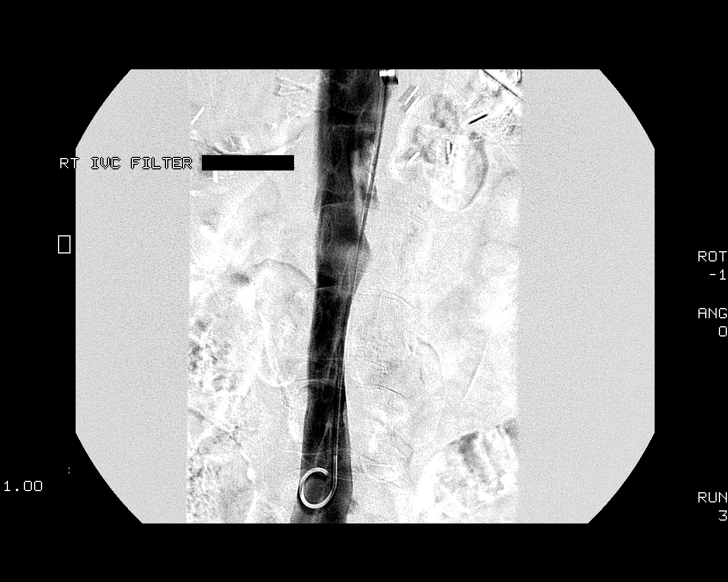
[im 13/13]
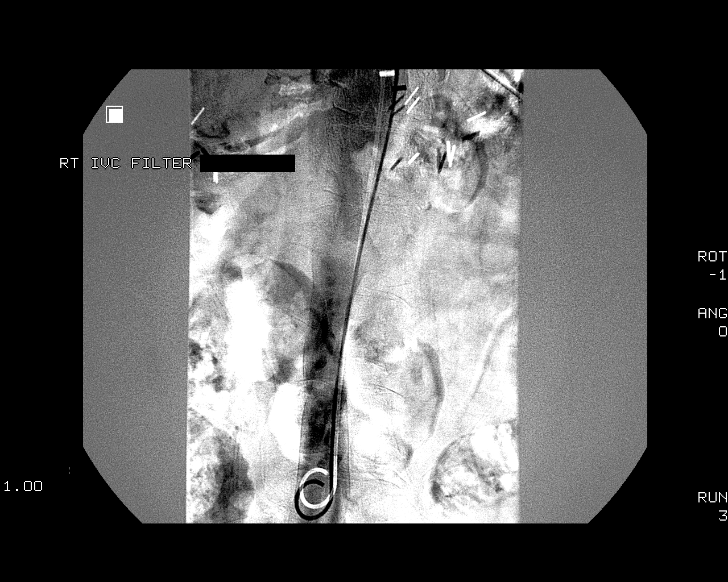

[12 of 13 positions shown; findings below may reference images not displayed]

MEDICATIONS:
Lidocaine 1% subcutaneous

ANESTHESIA/SEDATION:
Intravenous Fentanyl and Versed were administered as conscious
sedation during continuous cardiorespiratory monitoring by the
radiology RN, with a total moderate sedation time of 60 minutes.

FLUOROSCOPY TIME:  FLUOROSCOPY TIME
16 minutes 6 seconds, 7411 mGy

CONTRAST:  50mL OMNIPAQUE IOHEXOL 300 MG/ML SOLN, 10mL OMNIPAQUE
IOHEXOL 300 MG/ML SOLN
The right neck was prepped and draped in the usual sterile fashion,
and a sterile drape was applied covering the operative field.
Maximum barrier sterile technique with sterile gowns and gloves were
used for the procedure. A timeout was performed prior to the
initiation of the procedure. Local anesthesia was provided with 1%
lidocaine.

Under direct ultrasound guidance, the right internal jugular vein
was accessed with a micropuncture set after the overlying soft
tissues were anesthetized with 1% lidocaine. An ultrasound image was
saved for documentation purposes. Micropuncture catheter exchanged
over a Bentson wire for a 5 French pigtail catheter, advanced to the
level of the caudal IVC and an inferior venacavagram was performed.

The diagnostic catheter was exchanged for a 16 French vascular
sheath. A 5 French Sos catheter was advanced and hooked under the
tip of the filter. A Glidewire was advanced through the Sos into the
suprarenal IVC. A 6 French snare device was advanced parallel to the
Sos catheter and used to snare the tip of the Glidewire. Gentle
traction on both freed the tip of the filter from the caval wall.
The catheter and Glidewire were removed. The snare was placed around
the superior tip of the filter. There is some residual filter tilt
such that the filter would not easily pass into the sheath.
Endovascular forceps were advanced by Dr. Jayanth to manipulate the
filter to more advantageous position. With gentle traction from the
snare, the filter was brought up into the sheath and removed intact.
A completion inferior venacavagram was performed.

At this point, the procedure was terminated. All wires, catheters
and sheaths were removed from the patient. Hemostasis was achieved
at the right neck access site with manual compression. A dressing
was placed. The patient tolerated the above procedure well without
immediate postprocedural complication.
FINDINGS: Inferior venacavagram demonstrates wide patency of the IVC. There is
no thrombus within the infrarenal IVC filter which appears unchanged
in position from the time of initial placement.

The IVC filter was successfully removed using a snare device as
detailed above.

Completion inferior venacavagram was negative for caval injury.
IMPRESSION: 1. Successful fluoroscopic guided removal of infrarenal IVC filter.

2. Normal inferior venacavagram.

## 2016-01-05 ENCOUNTER — Other Ambulatory Visit: Payer: Self-pay | Admitting: Nurse Practitioner

## 2016-01-28 ENCOUNTER — Other Ambulatory Visit: Payer: Self-pay | Admitting: Nurse Practitioner

## 2016-01-28 DIAGNOSIS — G5690 Unspecified mononeuropathy of unspecified upper limb: Secondary | ICD-10-CM

## 2016-02-08 ENCOUNTER — Ambulatory Visit (INDEPENDENT_AMBULATORY_CARE_PROVIDER_SITE_OTHER): Payer: Medicaid Other | Admitting: Nurse Practitioner

## 2016-02-08 ENCOUNTER — Encounter: Payer: Self-pay | Admitting: Nurse Practitioner

## 2016-02-08 VITALS — BP 112/68 | HR 78 | Temp 98.2°F | Ht 61.0 in | Wt 193.0 lb

## 2016-02-08 DIAGNOSIS — E876 Hypokalemia: Secondary | ICD-10-CM | POA: Diagnosis not present

## 2016-02-08 DIAGNOSIS — C8590 Non-Hodgkin lymphoma, unspecified, unspecified site: Secondary | ICD-10-CM

## 2016-02-08 DIAGNOSIS — D61818 Other pancytopenia: Secondary | ICD-10-CM

## 2016-02-08 DIAGNOSIS — F329 Major depressive disorder, single episode, unspecified: Secondary | ICD-10-CM | POA: Diagnosis not present

## 2016-02-08 DIAGNOSIS — E785 Hyperlipidemia, unspecified: Secondary | ICD-10-CM | POA: Diagnosis not present

## 2016-02-08 DIAGNOSIS — G4700 Insomnia, unspecified: Secondary | ICD-10-CM | POA: Diagnosis not present

## 2016-02-08 DIAGNOSIS — F32A Depression, unspecified: Secondary | ICD-10-CM

## 2016-02-08 DIAGNOSIS — G5691 Unspecified mononeuropathy of right upper limb: Secondary | ICD-10-CM

## 2016-02-08 DIAGNOSIS — Z23 Encounter for immunization: Secondary | ICD-10-CM | POA: Diagnosis not present

## 2016-02-08 DIAGNOSIS — R600 Localized edema: Secondary | ICD-10-CM

## 2016-02-08 DIAGNOSIS — R609 Edema, unspecified: Secondary | ICD-10-CM

## 2016-02-08 DIAGNOSIS — B372 Candidiasis of skin and nail: Secondary | ICD-10-CM | POA: Diagnosis not present

## 2016-02-08 DIAGNOSIS — R569 Unspecified convulsions: Secondary | ICD-10-CM | POA: Diagnosis not present

## 2016-02-08 LAB — CBC WITH DIFFERENTIAL/PLATELET
BASOS ABS: 0 10*3/uL (ref 0.0–0.2)
Basos: 0 %
EOS (ABSOLUTE): 0.2 10*3/uL (ref 0.0–0.4)
Eos: 3 %
Hematocrit: 37.6 % (ref 34.0–46.6)
Hemoglobin: 12.7 g/dL (ref 11.1–15.9)
IMMATURE GRANS (ABS): 0 10*3/uL (ref 0.0–0.1)
IMMATURE GRANULOCYTES: 0 %
LYMPHS: 23 %
Lymphocytes Absolute: 1.3 10*3/uL (ref 0.7–3.1)
MCH: 31.6 pg (ref 26.6–33.0)
MCHC: 33.8 g/dL (ref 31.5–35.7)
MCV: 94 fL (ref 79–97)
Monocytes Absolute: 0.5 10*3/uL (ref 0.1–0.9)
Monocytes: 8 %
NEUTROS PCT: 66 %
Neutrophils Absolute: 3.5 10*3/uL (ref 1.4–7.0)
PLATELETS: 154 10*3/uL (ref 150–379)
RBC: 4.02 x10E6/uL (ref 3.77–5.28)
RDW: 12.3 % (ref 12.3–15.4)
WBC: 5.5 10*3/uL (ref 3.4–10.8)

## 2016-02-08 LAB — CMP14+EGFR
ALT: 46 IU/L — AB (ref 0–32)
AST: 35 IU/L (ref 0–40)
Albumin/Globulin Ratio: 1.5 (ref 1.2–2.2)
Albumin: 3.8 g/dL (ref 3.5–5.5)
Alkaline Phosphatase: 122 IU/L — ABNORMAL HIGH (ref 39–117)
BILIRUBIN TOTAL: 0.7 mg/dL (ref 0.0–1.2)
BUN/Creatinine Ratio: 21 (ref 9–23)
BUN: 18 mg/dL (ref 6–24)
CALCIUM: 8.7 mg/dL (ref 8.7–10.2)
CHLORIDE: 101 mmol/L (ref 96–106)
CO2: 27 mmol/L (ref 18–29)
Creatinine, Ser: 0.86 mg/dL (ref 0.57–1.00)
GFR, EST AFRICAN AMERICAN: 89 mL/min/{1.73_m2} (ref >59)
GFR, EST NON AFRICAN AMERICAN: 77 mL/min/{1.73_m2} (ref >59)
GLUCOSE: 79 mg/dL (ref 65–99)
Globulin, Total: 2.5 g/dL (ref 1.5–4.5)
Potassium: 4.1 mmol/L (ref 3.5–5.2)
Sodium: 145 mmol/L — ABNORMAL HIGH (ref 134–144)
TOTAL PROTEIN: 6.3 g/dL (ref 6.0–8.5)

## 2016-02-08 LAB — LIPID PANEL
Chol/HDL Ratio: 2.3 ratio units (ref 0.0–4.4)
Cholesterol, Total: 128 mg/dL (ref 100–199)
HDL: 55 mg/dL (ref >39)
LDL Calculated: 49 mg/dL (ref 0–99)
Triglycerides: 122 mg/dL (ref 0–149)
VLDL CHOLESTEROL CAL: 24 mg/dL (ref 5–40)

## 2016-02-08 MED ORDER — CYCLOBENZAPRINE HCL 5 MG PO TABS: 5.0000 mg | ORAL_TABLET | Freq: Three times a day (TID) | ORAL | 1 refills | Status: DC

## 2016-02-08 MED ORDER — FENTANYL 75 MCG/HR TD PT72: 75.0000 ug | MEDICATED_PATCH | TRANSDERMAL | 0 refills | Status: DC

## 2016-02-08 MED ORDER — FENTANYL 75 MCG/HR TD PT72
75.0000 ug | MEDICATED_PATCH | TRANSDERMAL | 0 refills | Status: DC
Start: 2016-02-08 — End: 2016-02-08

## 2016-02-08 MED ORDER — ATORVASTATIN CALCIUM 40 MG PO TABS: 40.0000 mg | ORAL_TABLET | Freq: Every day | ORAL | 5 refills | Status: DC

## 2016-02-08 NOTE — Patient Instructions (Signed)

## 2016-02-08 NOTE — Progress Notes (Signed)
Subjective:    Patient ID: Angela Michael, female    DOB: [birth year], 55 y.o.   MRN: [id number]   Patient here today for follow up of chronic medical problems. The patient did have a fall about one month ago. She went to [facility] and had a MRI completed. She reports the MRI was fine but that she was told she probably had a concussion when she fell and that it would cause some headaches, which she is having. She was told to continue her medications as ordered.  Her concern today is that she stays moist in her skin folds on her back and groin area from all the weight she has lost. She reports it turns red, becomes whitish, and has a foul odor. She sits under a fan to dry and put alcohol on it to get rid of the redness.   Outpatient Encounter Prescriptions as of 02/08/2016  Medication Sig  . Ascorbic Acid (VITAMIN C) 1000 MG tablet Take 1,000 mg by mouth daily. Reported on 06/21/2015  . aspirin 81 MG tablet Take 81 mg by mouth daily. Reported on 06/21/2015  . atorvastatin (LIPITOR) 40 MG tablet Take 1 tablet (40 mg total) by mouth at bedtime.  . cyanocobalamin (,VITAMIN B-12,) 1000 MCG/ML injection Inject 1 mL (1,000 mcg total) into the muscle every 30 (thirty) days.  . cyclobenzaprine (FLEXERIL) 5 MG tablet TAKE 1 TABLET BY MOUTH 3 TIMES A DAY  . Fe Fum-FePoly-Vit C-Vit B3 (INTEGRA) 62.5-62.5-40-3 MG CAPS TAKE ONE CAPSULE BY MOUTH EVERY DAY  . fentaNYL (DURAGESIC) 75 MCG/HR Place 1 patch (75 mcg total) onto the skin every 3 (three) days.  . fentaNYL (DURAGESIC) 75 MCG/HR Place 1 patch (75 mcg total) onto the skin every 3 (three) days.  . fentaNYL (DURAGESIC) 75 MCG/HR Place 1 patch (75 mcg total) onto the skin every 3 (three) days.  Marland Kitchen FLUoxetine (PROZAC) 40 MG capsule Take 2 capsules (80 mg total) by mouth daily.  . fluticasone (FLONASE) 50 MCG/ACT nasal spray PLACE 2 SPRAYS INTO THE NOSE AS NEEDED. FOR ALLERGIES  . furosemide (LASIX) 40 MG tablet TAKE 1 TABLET (40 MG TOTAL) BY MOUTH DAILY.  Marland Kitchen  gabapentin (NEURONTIN) 300 MG capsule TAKE 2 CAPSULE BY MOUTH 2 TIMES DAILY  . levETIRAcetam (KEPPRA) 500 MG tablet TAKE 2 TABLETS BY MOUTH 2 TIMES DAILY  . loratadine (CLARITIN) 10 MG tablet Take 10 mg by mouth as needed. For allergies  . Multiple Vitamin (MULTIVITAMIN WITH MINERALS) TABS Take 1 tablet by mouth daily. Reported on 06/21/2015  . nitroGLYCERIN (NITROSTAT) 0.4 MG SL tablet PLACE 1 TABLET (0.4 MG TOTAL) UNDER THE TONGUE EVERY 5 (FIVE) MINUTES AS NEEDED FOR CHEST PAIN.  Marland Kitchen Nutritional Supplements (ESTROVEN PMS) TABS Take 1 tablet by mouth daily.   . traZODone (DESYREL) 50 MG tablet TAKE 1 TO 3 TABLETS AT BEDTIME    Hyperlipidemia  This is a chronic problem. The current episode started more than 1 year ago. The problem is uncontrolled. Recent lipid tests were reviewed and are high. Associated symptoms include myalgias. Pertinent negatives include no chest pain. Current antihyperlipidemic treatment includes statins. The current treatment provides moderate improvement of lipids. Compliance problems include adherence to diet and adherence to exercise.  Risk factors for coronary artery disease include post-menopausal, dyslipidemia and obesity.  Seizures Keppra working well no seizure in several years. Peripheral edema Lasix working well to keep edema under control. Hypokalemia K-Dur working well. No C/O lower ext aches Chronic right arm pain and neuropathy Duragesic patches makes  pain tolerable- neurotin helps a lot. Depression Prozac working well- has occassional bouts of depression but nothing she can't handle Pancytopenia/anemia Needs anemia panel done today lymphoma Currently not having treatments- they are currently watching for change in size of area. Insomnia trazadone helps her to sleep at night systemic inflammatory response syndrome See neurologist- has chronic pain despite fentanyl patch  Pain assessment: Cause of pain- SIRS as well as right shoulder neuropathy Pain  location- all over worse in right shoulder Pain on scale of 1-10- 7/10 currently Frequency- constant daily What increases pain- activities, errands What makes pain Better- heat, rest, elevation of BLE Effects on ADL - some days not able to do anything because of pain Any change in general medical condition- no  Current medications- fentanyl patch   Effectiveness of current meds- some days good some days not Adverse reactions form pain meds-none  Pill count performed-No Urine drug screen- No   The patient reports that at times she needs an extra "something" to help with pain. She used to have a prescription for Norco7.5-325 mg, but no longer has any.   Review of Systems  Constitutional: Negative.   HENT: Negative.   Respiratory: Negative for cough, choking and chest tightness.   Cardiovascular: Negative for chest pain.  Gastrointestinal: Negative.   Endocrine: Negative.   Genitourinary: Negative.   Musculoskeletal: Positive for arthralgias and myalgias.  Skin:       Redness/moisture in skin folds of back and groin area.  Allergic/Immunologic: Negative.   Neurological: Negative.   Psychiatric/Behavioral: Negative.   All other systems reviewed and are negative.      Objective:   Physical Exam  Constitutional: She is oriented to person, place, and time. She appears well-developed and well-nourished.  HENT:  Head: Normocephalic.  Right Ear: External ear normal.  Left Ear: External ear normal.  Nose: Nose normal.  Mouth/Throat: Oropharynx is clear and moist.  Eyes: Conjunctivae and EOM are normal. Pupils are equal, round, and reactive to light.  Neck: Trachea normal, normal range of motion and full passive range of motion without pain. Neck supple. No JVD present. Carotid bruit is not present. No thyromegaly present.  Cardiovascular: Normal rate, regular rhythm, normal heart sounds and intact distal pulses.  Exam reveals no gallop and no friction rub.   No murmur  heard. Pulmonary/Chest: Effort normal and breath sounds normal.  Abdominal: Soft. Bowel sounds are normal. She exhibits no distension and no mass. There is no tenderness.  Musculoskeletal: Normal range of motion.  Lymphadenopathy:    She has no cervical adenopathy.  Neurological: She is alert and oriented to person, place, and time. She has normal reflexes.  Skin: Skin is warm and dry.  Psychiatric: She has a normal mood and affect. Her behavior is normal. Judgment and thought content normal.     BP 112/68   Pulse 78   Temp 98.2 F (36.8 C) (Oral)   Ht 5' 1"  (1.549 m)   Wt 193 lb (87.5 kg)   BMI 36.47 kg/m       Assessment & Plan:  1. Hyperlipidemia with target LDL less than 100 Avoid fatty foods. - atorvastatin (LIPITOR) 40 MG tablet; Take 1 tablet (40 mg total) by mouth at bedtime.  Dispense: 30 tablet; Refill: 5 - CMP14+EGFR - Lipid panel  2. Seizures ([place]) Controlled. Keep follow-up with neurologist.  3. Peripheral edema Controlled  4. Hypokalemia Controlled  5. Neuropathy of right upper extremity Tolerable with Neurotin.  6. Depression, unspecified depression type  Controlled. Stress management.  7. Non-Hodgkin's lymphoma, unspecified body region, unspecified non-Hodgkin lymphoma type ([place]) Continue to follow-up with oncologist.  8. Insomnia, unspecified type Controlled. Bedtime routine.  9. Pancytopenia ([place]) - CBC with Differential/Platelet  10. Neuropathy of upper extremity, right Continue current medications. - fentaNYL (DURAGESIC) 75 MCG/HR; Place 1 patch (75 mcg total) onto the skin every 3 (three) days.  Dispense: 10 patch; Refill: 0 - fentaNYL (DURAGESIC) 75 MCG/HR; Place 1 patch (75 mcg total) onto the skin every 3 (three) days.  Dispense: 10 patch; Refill: 0 - fentaNYL (DURAGESIC) 75 MCG/HR; Place 1 patch (75 mcg total) onto the skin every 3 (three) days.  Dispense: 10 patch; Refill: 0  11. Skin yeast infection Resolved.  Instructed to  keep areas dry.   12. Morbid Obesity Discussed diet and exercise for person with BMI >25 Will recheck weight in 3-6 months   Labs pending Health maintenance reviewed Diet and exercise encouraged Continue all meds Follow up in 3 months   Hendricks Limes, BSN-RN/FNP student  [place], FNP

## 2016-03-22 ENCOUNTER — Other Ambulatory Visit: Payer: Self-pay | Admitting: Nurse Practitioner

## 2016-03-22 DIAGNOSIS — D509 Iron deficiency anemia, unspecified: Secondary | ICD-10-CM

## 2016-04-13 ENCOUNTER — Other Ambulatory Visit: Payer: Self-pay | Admitting: Nurse Practitioner

## 2016-05-08 ENCOUNTER — Encounter: Payer: Self-pay | Admitting: Nurse Practitioner

## 2016-05-08 ENCOUNTER — Ambulatory Visit (INDEPENDENT_AMBULATORY_CARE_PROVIDER_SITE_OTHER): Payer: Medicaid Other | Admitting: Nurse Practitioner

## 2016-05-08 ENCOUNTER — Other Ambulatory Visit: Payer: Self-pay | Admitting: Nurse Practitioner

## 2016-05-08 DIAGNOSIS — G5691 Unspecified mononeuropathy of right upper limb: Secondary | ICD-10-CM

## 2016-05-08 MED ORDER — FENTANYL 50 MCG/HR TD PT72: 50.0000 ug | MEDICATED_PATCH | TRANSDERMAL | 0 refills | Status: DC

## 2016-05-08 MED ORDER — FENTANYL 75 MCG/HR TD PT72: 75.0000 ug | MEDICATED_PATCH | TRANSDERMAL | 0 refills | Status: DC

## 2016-05-08 MED ORDER — FENTANYL 50 MCG/HR TD PT72
50.0000 ug | MEDICATED_PATCH | TRANSDERMAL | 0 refills | Status: DC
Start: 2016-05-08 — End: 2016-07-22

## 2016-05-08 NOTE — Progress Notes (Signed)
   Subjective:    Patient ID: Angela Michael, female    DOB: 1961/01/27, 56 y.o.   MRN: [id number]  HPI Patient worked into schedule today for pain management only- she has ran out of her pain meds and is really hurting. Patient was actually taken  To hospital 04/01/16 with possible opiate overdose- they gave her narcan, then turned around and gave her fentanyl shot for pain and to prevent withdrawal. They recommended her decreasing fentanyl dose. Pain assessment: Cause of pain- 1 shoulder injury 2. osteoarthritis Pain location- 1 right shoulder 2. bil knees Pain on scale of 1-10- 10/10 today Frequency- daily for both What increases pain-activity What makes pain Better- nothing completely relieves her pain Effects on ADL - some days she is unable to do anything- she also cannot sleep when she is hurting Any change in general medical condition-she is having frequent migraines.  Current medications- fentanyl 53mcg TID  Effectiveness of current meds-helps some Adverse reactions form pain meds-none Morphine equivalent-180  Pill count performed-No Urine drug screen- No Was the [place] reviewed- yes  If yes were their any concerning findings? - no     Review of Systems  Constitutional: Negative.   HENT: Negative.   Eyes: Negative.   Respiratory: Negative.   Cardiovascular: Negative.   Gastrointestinal: Negative.   Genitourinary: Negative.   Neurological: Negative.   Psychiatric/Behavioral: Negative.   All other systems reviewed and are negative.      Objective:   Physical Exam  Constitutional: She is oriented to person, place, and time. She appears well-developed and well-nourished. She appears distressed (lots of pain today).  Cardiovascular: Normal rate and regular rhythm.   Pulmonary/Chest: Effort normal and breath sounds normal.  Neurological: She is alert and oriented to person, place, and time.  Skin: Skin is warm.  Psychiatric: She has a normal mood and affect. Her  behavior is normal. Judgment and thought content normal.   BP 104/65   Pulse (!) 53   Temp 97.5 F (36.4 C) (Oral)   Ht 5\' 1"  (1.549 m)   Wt 195 lb (88.5 kg)   BMI 36.84 kg/m      Assessment & Plan:   1. Neuropathy of upper extremity, right    Meds ordered this encounter  Medications  . fentaNYL (DURAGESIC) 75 MCG/HR    Sig: Place 1 patch (75 mcg total) onto the skin every 3 (three) days.    Dispense:  10 patch    Refill:  0    Order Specific Question:   Supervising Provider    Answer:   VINCENT, CAROL L [[id number]]  . fentaNYL (DURAGESIC) 50 MCG/HR    Sig: Place 1 patch (50 mcg total) onto the skin every 3 (three) days.    Dispense:  10 patch    Refill:  0    Do not fill till 06/07/16    Order Specific Question:   Supervising Provider    Answer:   Eustaquio Maize [[id number]]  . fentaNYL (DURAGESIC) 50 MCG/HR    Sig: Place 1 patch (50 mcg total) onto the skin every 3 (three) days.    Dispense:  10 patch    Refill:  0    DO NOT FILL TILL 07/04/16    Order Specific Question:   Supervising Provider    Answer:   Eustaquio Maize [[id number]]   We are weaning her down from 15mcg to 67mcg Patient understands RTO for follow up in [place], [place]

## 2016-05-08 NOTE — Patient Instructions (Signed)
How can pain medicine affect me? You were prescribed pain medicine. This medicine may:  Make you tired or sleepy.  Make you feel dizzy.  Affect how well you can:  Drive  Do certain activities. Pain medicine may not make all of your pain go away. You should be comfortable enough to:  Move.  Breathe.  Take care of yourself. How often should I take pain medicine and how much should I take?  Take pain medicine only as told by your doctor and only as needed for pain.  You do not need to take pain medicine if you are not having pain, unless your doctor tells you to do that.  You can take less than the prescribed dose if you find that less medicine helps your pain.  If you have very bad (severe) pain, call your doctor. Do not take more pills than told by your doctor. Do not take pills more often than told by your doctor. What should I avoid while I am taking pain medicine? Follow these instructions after you start taking pain medicine, while you are taking the medicine, and for 8 hours after you stop taking the medicine:  Do not drive.  Do not use machinery.  Do not use power tools.  Do not sign legal documents.  Do not drink alcohol.  Do not take sleeping pills.  Do not take care of children by yourself.  Do not do any activities that involve climbing or being in high places.  Do not go into any body of water unless there is an adult nearby who can watch and help you. This includes:  [place].  Rivers.  Oceans.  Spas.  Swimming pools. How can I keep others safe while I am taking pain medicine?  Store your pain medicine as told by your doctor. Make sure that you keep it where children and pets cannot reach it.  Do not share your pain medicine with anyone.  Do not save any leftover pills. If you have any leftover pain medicine, get rid of it or destroy it as told by your doctor. What else do I need to know about taking pain medicine?  Use a poop (stool)  softener if you have trouble pooping (constipation) because of your pain medicine. Eating more fruits and vegetables also helps with constipation.  Write down the times when you take your pain medicine. Look at the times before you take your next dose of medicine.  Your pain medicine might have acetaminophen in it. Do not take any other acetaminophen while you are taking this medicine. An overdose of acetaminophen can do very bad damage to your liver. If you are taking any medicines in addition to your pain medicine, check the active ingredients on those medicines to see if acetaminophen is listed. When should I call my doctor?  Your medicine is not helping the pain.  You do either of these soon after you take the medicine:  Throw up (vomit).  Have watery poop (diarrhea).  You have new pain in areas that did not hurt before.  You have an allergic reaction to your medicine. This may include:  Feeling itchy.  Swelling.  Feeling dizzy.  Getting a new rash.  You cannot put up with feeling:  Dizzy.  Sick to your stomach (nauseous). When should I call 911 or go to the emergency room?  You pass out (faint).  You feel very confused.  You throw up again and again.  Your skin or lips turn pale  or bluish in color.  You are:  Short of breath.  Breathing much more slowly than usual.  You have a very bad allergic reaction to your medicine. This includes:  Developing a swollen tongue.  Having trouble breathing. This information is not intended to replace advice given to you by your health care provider. Make sure you discuss any questions you have with your health care provider. Document Released: 09/17/2007 Document Revised: 12/06/2015 Document Reviewed: 02/02/2014  2017 Elsevier

## 2016-05-14 ENCOUNTER — Ambulatory Visit: Payer: Medicaid Other | Admitting: Nurse Practitioner

## 2016-05-23 ENCOUNTER — Other Ambulatory Visit: Payer: Self-pay | Admitting: Nurse Practitioner

## 2016-05-27 ENCOUNTER — Other Ambulatory Visit: Payer: Self-pay

## 2016-05-27 MED ORDER — CYCLOBENZAPRINE HCL 5 MG PO TABS: 5.0000 mg | ORAL_TABLET | Freq: Three times a day (TID) | ORAL | 0 refills | Status: DC

## 2016-05-28 ENCOUNTER — Other Ambulatory Visit: Payer: Self-pay | Admitting: *Deleted

## 2016-06-09 ENCOUNTER — Other Ambulatory Visit: Payer: Self-pay | Admitting: Nurse Practitioner

## 2016-06-09 NOTE — Telephone Encounter (Signed)
What is the name of the medication? lyrica  Have you contacted your pharmacy to request a refill? No, she was prescribed this med by a DR at [facility] would you like this sent to? [facility]   Patient notified that their request is being sent to the clinical staff for review and that they should receive a call once it is complete. If they do not receive a call within 24 hours they can check with their pharmacy or our office.

## 2016-06-09 NOTE — Telephone Encounter (Signed)
Forwarding to pcp MMM 

## 2016-06-10 ENCOUNTER — Other Ambulatory Visit: Payer: Self-pay | Admitting: Nurse Practitioner

## 2016-06-10 NOTE — Telephone Encounter (Signed)
lmtcb

## 2016-06-10 NOTE — Telephone Encounter (Signed)
Will need to get from rx physician- our chart says she is on gabapentin

## 2016-06-10 NOTE — Telephone Encounter (Signed)
But is she still taking gabapentin?

## 2016-06-10 NOTE — Telephone Encounter (Signed)
Explained to patient that she would need to contact doctor at [facility] who prescribed medication to refill.  She wanted to let you know this was prescribed in ER visit on the 19th, wanted to see if you would prescribe since she received from an ER doc.  Was written as Lyrica, 25 mg, 1 TID.  Please advise.

## 2016-06-11 ENCOUNTER — Ambulatory Visit: Payer: Medicaid Other | Admitting: Nurse Practitioner

## 2016-06-12 ENCOUNTER — Encounter: Payer: Self-pay | Admitting: Nurse Practitioner

## 2016-06-20 ENCOUNTER — Encounter: Payer: Self-pay | Admitting: Nurse Practitioner

## 2016-06-20 ENCOUNTER — Ambulatory Visit (INDEPENDENT_AMBULATORY_CARE_PROVIDER_SITE_OTHER): Payer: Medicaid Other | Admitting: Nurse Practitioner

## 2016-06-20 VITALS — BP 96/60 | HR 69 | Temp 97.1°F | Ht 61.0 in | Wt 198.0 lb

## 2016-06-20 DIAGNOSIS — N760 Acute vaginitis: Secondary | ICD-10-CM

## 2016-06-20 DIAGNOSIS — G5690 Unspecified mononeuropathy of unspecified upper limb: Secondary | ICD-10-CM | POA: Diagnosis not present

## 2016-06-20 DIAGNOSIS — B9689 Other specified bacterial agents as the cause of diseases classified elsewhere: Secondary | ICD-10-CM

## 2016-06-20 MED ORDER — GABAPENTIN 300 MG PO CAPS: ORAL_CAPSULE | ORAL | 5 refills | Status: DC

## 2016-06-20 MED ORDER — METRONIDAZOLE 500 MG PO TABS: 500.0000 mg | ORAL_TABLET | Freq: Two times a day (BID) | ORAL | 0 refills | Status: DC

## 2016-06-20 NOTE — Progress Notes (Addendum)
   Subjective:    Patient ID: Angela Michael, female    DOB: 02-01-1961, 56 y.o.   MRN: [id number]  HPI Patient come sin today for pain follow up. She was seen on 05/08/16. She was admitted to hospital for opoid overdose  On 04/01/17. They recommended that we decrease fentanyl dose. We cut her fentanyl patches down from 50mcg to 62mcg. She was given lyrica in the hospital but neurologist will not refill it. SHe is also on gabapentin and  explained to her that she cannot be on both of these. SHe is insisting that we increase her fentanyl back to 67mcg if we will not give her lyrica. SHe says that she is having trouble sleeping since we decreased fentanyl patch.  * C/O fishy smelling discharge  Review of Systems  Constitutional: Negative.   HENT: Negative.   Respiratory: Negative.   Cardiovascular: Negative.   Gastrointestinal: Negative.   Musculoskeletal: Positive for arthralgias and myalgias.  Neurological: Negative.   Psychiatric/Behavioral: Negative.   All other systems reviewed and are negative.      Objective:   Physical Exam  Constitutional: She appears well-developed and well-nourished.  Cardiovascular: Normal rate and regular rhythm.   Musculoskeletal:  FROM of right shoulder with pain on abduction at 90 degrees.  Skin: Skin is warm.  Psychiatric: She has a normal mood and affect. Her behavior is normal. Judgment and thought content normal.   BP 96/60   Pulse 69   Temp 97.1 F (36.2 C) (Oral)   Ht 5\' 1"  (1.549 m)   Wt 198 lb (89.8 kg)   BMI 37.41 kg/m         Assessment & Plan:  1. Neuropathy of upper extremity, unspecified laterality Increased gabapentin to 300mg  2 po TID Will not increase fentanly dose at this time Follow up in 2 months for medication refill.  2. bacterial vaginosis -flagyl 500mg  1 po BID X7 days  Mary-Margaret Hassell Done, FNP

## 2016-06-20 NOTE — Patient Instructions (Signed)
Insomnia Insomnia is a sleep disorder that makes it difficult to fall asleep or to stay asleep. Insomnia can cause tiredness (fatigue), low energy, difficulty concentrating, mood swings, and poor performance at work or school. There are three different ways to classify insomnia:  Difficulty falling asleep.  Difficulty staying asleep.  Waking up too early in the morning. Any type of insomnia can be long-term (chronic) or short-term (acute). Both are common. Short-term insomnia usually lasts for three months or less. Chronic insomnia occurs at least three times a week for longer than three months. What are the causes? Insomnia may be caused by another condition, situation, or substance, such as:  Anxiety.  Certain medicines.  Gastroesophageal reflux disease (GERD) or other gastrointestinal conditions.  Asthma or other breathing conditions.  Restless legs syndrome, sleep apnea, or other sleep disorders.  Chronic pain.  Menopause. This may include hot flashes.  Stroke.  Abuse of alcohol, tobacco, or illegal drugs.  Depression.  Caffeine.  Neurological disorders, such as Alzheimer disease.  An overactive thyroid (hyperthyroidism). The cause of insomnia may not be known. What increases the risk? Risk factors for insomnia include:  Gender. Women are more commonly affected than men.  Age. Insomnia is more common as you get older.  Stress. This may involve your professional or personal life.  Income. Insomnia is more common in people with lower income.  Lack of exercise.  Irregular work schedule or night shifts.  Traveling between different time zones. What are the signs or symptoms? If you have insomnia, trouble falling asleep or trouble staying asleep is the main symptom. This may lead to other symptoms, such as:  Feeling fatigued.  Feeling nervous about going to sleep.  Not feeling rested in the morning.  Having trouble concentrating.  Feeling irritable,  anxious, or depressed. How is this treated? Treatment for insomnia depends on the cause. If your insomnia is caused by an underlying condition, treatment will focus on addressing the condition. Treatment may also include:  Medicines to help you sleep.  Counseling or therapy.  Lifestyle adjustments. Follow these instructions at home:  Take medicines only as directed by your health care provider.  Keep regular sleeping and waking hours. Avoid naps.  Keep a sleep diary to help you and your health care provider figure out what could be causing your insomnia. Include:  When you sleep.  When you wake up during the night.  How well you sleep.  How rested you feel the next day.  Any side effects of medicines you are taking.  What you eat and drink.  Make your bedroom a comfortable place where it is easy to fall asleep:  Put up shades or special blackout curtains to block light from outside.  Use a white noise machine to block noise.  Keep the temperature cool.  Exercise regularly as directed by your health care provider. Avoid exercising right before bedtime.  Use relaxation techniques to manage stress. Ask your health care provider to suggest some techniques that may work well for you. These may include:  Breathing exercises.  Routines to release muscle tension.  Visualizing peaceful scenes.  Cut back on alcohol, caffeinated beverages, and cigarettes, especially close to bedtime. These can disrupt your sleep.  Do not overeat or eat spicy foods right before bedtime. This can lead to digestive discomfort that can make it hard for you to sleep.  Limit screen use before bedtime. This includes:  Watching TV.  Using your smartphone, tablet, and computer.  Stick to a   routine. This can help you fall asleep faster. Try to do a quiet activity, brush your teeth, and go to bed at the same time each night.  Get out of bed if you are still awake after 15 minutes of trying to  sleep. Keep the lights down, but try reading or doing a quiet activity. When you feel sleepy, go back to bed.  Make sure that you drive carefully. Avoid driving if you feel very sleepy.  Keep all follow-up appointments as directed by your health care provider. This is important. Contact a health care provider if:  You are tired throughout the day or have trouble in your daily routine due to sleepiness.  You continue to have sleep problems or your sleep problems get worse. Get help right away if:  You have serious thoughts about hurting yourself or someone else. This information is not intended to replace advice given to you by your health care provider. Make sure you discuss any questions you have with your health care provider. Document Released: 03/28/2000 Document Revised: 08/31/2015 Document Reviewed: 12/30/2013 Elsevier Interactive Patient Education  2017 Elsevier Inc.  

## 2016-06-20 NOTE — Addendum Note (Signed)
Addended by: Chevis Pretty on: 06/20/2016 04:18 PM   Modules accepted: Orders

## 2016-06-21 LAB — CMP14+EGFR
ALT: 22 IU/L (ref 0–32)
AST: 21 IU/L (ref 0–40)
Albumin/Globulin Ratio: 1.9 (ref 1.2–2.2)
Albumin: 3.7 g/dL (ref 3.5–5.5)
Alkaline Phosphatase: 107 IU/L (ref 39–117)
BUN/Creatinine Ratio: 25 — ABNORMAL HIGH (ref 9–23)
BUN: 19 mg/dL (ref 6–24)
Bilirubin Total: 0.9 mg/dL (ref 0.0–1.2)
CALCIUM: 8.8 mg/dL (ref 8.7–10.2)
CO2: 27 mmol/L (ref 18–29)
CREATININE: 0.75 mg/dL (ref 0.57–1.00)
Chloride: 100 mmol/L (ref 96–106)
GFR calc non Af Amer: 91 mL/min/{1.73_m2} (ref >59)
GFR, EST AFRICAN AMERICAN: 105 mL/min/{1.73_m2} (ref >59)
GLOBULIN, TOTAL: 2 g/dL (ref 1.5–4.5)
Glucose: 75 mg/dL (ref 65–99)
Potassium: 3.9 mmol/L (ref 3.5–5.2)
Sodium: 142 mmol/L (ref 134–144)
TOTAL PROTEIN: 5.7 g/dL — AB (ref 6.0–8.5)

## 2016-06-23 ENCOUNTER — Other Ambulatory Visit: Payer: Self-pay | Admitting: Nurse Practitioner

## 2016-06-23 DIAGNOSIS — G4700 Insomnia, unspecified: Secondary | ICD-10-CM

## 2016-06-23 NOTE — Telephone Encounter (Signed)
Gabapentin filled on 06/20/2016.  This encounter will now be closed

## 2016-07-22 ENCOUNTER — Ambulatory Visit (INDEPENDENT_AMBULATORY_CARE_PROVIDER_SITE_OTHER): Payer: Medicaid Other | Admitting: Nurse Practitioner

## 2016-07-22 ENCOUNTER — Encounter: Payer: Self-pay | Admitting: Nurse Practitioner

## 2016-07-22 ENCOUNTER — Other Ambulatory Visit: Payer: Self-pay | Admitting: Nurse Practitioner

## 2016-07-22 VITALS — BP 96/65 | HR 56 | Temp 96.1°F | Ht 61.0 in | Wt 192.0 lb

## 2016-07-22 DIAGNOSIS — L97909 Non-pressure chronic ulcer of unspecified part of unspecified lower leg with unspecified severity: Secondary | ICD-10-CM

## 2016-07-22 DIAGNOSIS — G5690 Unspecified mononeuropathy of unspecified upper limb: Secondary | ICD-10-CM

## 2016-07-22 DIAGNOSIS — E876 Hypokalemia: Secondary | ICD-10-CM | POA: Diagnosis not present

## 2016-07-22 DIAGNOSIS — R569 Unspecified convulsions: Secondary | ICD-10-CM

## 2016-07-22 DIAGNOSIS — G5691 Unspecified mononeuropathy of right upper limb: Secondary | ICD-10-CM

## 2016-07-22 DIAGNOSIS — I471 Supraventricular tachycardia: Secondary | ICD-10-CM

## 2016-07-22 DIAGNOSIS — F3342 Major depressive disorder, recurrent, in full remission: Secondary | ICD-10-CM

## 2016-07-22 DIAGNOSIS — D509 Iron deficiency anemia, unspecified: Secondary | ICD-10-CM

## 2016-07-22 DIAGNOSIS — R609 Edema, unspecified: Secondary | ICD-10-CM

## 2016-07-22 DIAGNOSIS — G4700 Insomnia, unspecified: Secondary | ICD-10-CM | POA: Diagnosis not present

## 2016-07-22 DIAGNOSIS — Z1231 Encounter for screening mammogram for malignant neoplasm of breast: Secondary | ICD-10-CM | POA: Diagnosis not present

## 2016-07-22 DIAGNOSIS — F329 Major depressive disorder, single episode, unspecified: Secondary | ICD-10-CM | POA: Diagnosis not present

## 2016-07-22 DIAGNOSIS — I83009 Varicose veins of unspecified lower extremity with ulcer of unspecified site: Secondary | ICD-10-CM

## 2016-07-22 DIAGNOSIS — E785 Hyperlipidemia, unspecified: Secondary | ICD-10-CM

## 2016-07-22 DIAGNOSIS — C8590 Non-Hodgkin lymphoma, unspecified, unspecified site: Secondary | ICD-10-CM

## 2016-07-22 DIAGNOSIS — F32A Depression, unspecified: Secondary | ICD-10-CM

## 2016-07-22 DIAGNOSIS — F5101 Primary insomnia: Secondary | ICD-10-CM

## 2016-07-22 DIAGNOSIS — D61818 Other pancytopenia: Secondary | ICD-10-CM

## 2016-07-22 MED ORDER — FENTANYL 50 MCG/HR TD PT72: 50.0000 ug | MEDICATED_PATCH | TRANSDERMAL | 0 refills | Status: DC

## 2016-07-22 MED ORDER — TRAZODONE HCL 50 MG PO TABS: ORAL_TABLET | ORAL | 1 refills | Status: DC

## 2016-07-22 MED ORDER — FLUOXETINE HCL 40 MG PO CAPS: 80.0000 mg | ORAL_CAPSULE | Freq: Every day | ORAL | 5 refills | Status: DC

## 2016-07-22 MED ORDER — LEVETIRACETAM 500 MG PO TABS: ORAL_TABLET | ORAL | 5 refills | Status: DC

## 2016-07-22 MED ORDER — ATORVASTATIN CALCIUM 40 MG PO TABS: 40.0000 mg | ORAL_TABLET | Freq: Every day | ORAL | 5 refills | Status: DC

## 2016-07-22 MED ORDER — INTEGRA 62.5-62.5-40-3 MG PO CAPS: 1.0000 | ORAL_CAPSULE | Freq: Every day | ORAL | 3 refills | Status: DC

## 2016-07-22 MED ORDER — FUROSEMIDE 40 MG PO TABS: ORAL_TABLET | ORAL | 5 refills | Status: DC

## 2016-07-22 MED ORDER — GABAPENTIN 300 MG PO CAPS: ORAL_CAPSULE | ORAL | 5 refills | Status: DC

## 2016-07-22 NOTE — Progress Notes (Signed)
Subjective:    Patient ID: Angela Michael, female    DOB: 08-21-1960, 56 y.o.   MRN: [id number]  HPI  Angela Michael is here today for follow up of chronic medical problem.  Outpatient Encounter Prescriptions as of 07/22/2016  Medication Sig  . Ascorbic Acid (VITAMIN C) 1000 MG tablet Take 1,000 mg by mouth daily. Reported on 06/21/2015  . aspirin 81 MG tablet Take 81 mg by mouth daily. Reported on 06/21/2015  . atorvastatin (LIPITOR) 40 MG tablet Take 1 tablet (40 mg total) by mouth at bedtime.  . cyanocobalamin (,VITAMIN B-12,) 1000 MCG/ML injection Inject 1 mL (1,000 mcg total) into the muscle every 30 (thirty) days.  . cyclobenzaprine (FLEXERIL) 5 MG tablet Take 1 tablet (5 mg total) by mouth 3 (three) times daily.  . Fe Fum-FePoly-Vit C-Vit B3 (INTEGRA) 62.5-62.5-40-3 MG CAPS TAKE ONE CAPSULE BY MOUTH EVERY DAY  . fentaNYL (DURAGESIC) 50 MCG/HR Place 1 patch (50 mcg total) onto the skin every 3 (three) days.  . fentaNYL (DURAGESIC) 50 MCG/HR Place 1 patch (50 mcg total) onto the skin every 3 (three) days.  Marland Kitchen FLUoxetine (PROZAC) 40 MG capsule Take 2 capsules (80 mg total) by mouth daily.  . fluticasone (FLONASE) 50 MCG/ACT nasal spray PLACE 2 SPRAYS INTO THE NOSE AS NEEDED. FOR ALLERGIES  . furosemide (LASIX) 40 MG tablet TAKE 1 TABLET (40 MG TOTAL) BY MOUTH DAILY.  Marland Kitchen gabapentin (NEURONTIN) 300 MG capsule TAKE 2 CAPSULE BY MOUTH TID  . levETIRAcetam (KEPPRA) 500 MG tablet TAKE 2 TABLETS BY MOUTH 2 TIMES DAILY  . loratadine (CLARITIN) 10 MG tablet Take 10 mg by mouth as needed. For allergies  . Multiple Vitamin (MULTIVITAMIN WITH MINERALS) TABS Take 1 tablet by mouth daily. Reported on 06/21/2015  . nitroGLYCERIN (NITROSTAT) 0.4 MG SL tablet PLACE 1 TABLET (0.4 MG TOTAL) UNDER THE TONGUE EVERY 5 (FIVE) MINUTES AS NEEDED FOR CHEST PAIN.  Marland Kitchen Nutritional Supplements (ESTROVEN PMS) TABS Take 1 tablet by mouth daily.   . traZODone (DESYREL) 50 MG tablet TAKE 1 TO 3 TABLETS AT BEDTIME     1.  SVT (supraventricular tachycardia) (HCC)  No recent episodes  2. Neuropathy of right upper extremity  No change- still has daiy pain- uses fenatnyl patch 37mcg  3. Pancytopenia [facility])  Sees hematologist  4. Depression, unspecified depression type  Doing okay Depression screen [facility] 2/9 07/22/2016 06/20/2016 05/08/2016 02/08/2016 11/05/2015  Decreased Interest 0 1 0 3 0  Down, Depressed, Hopeless 0 1 2 2  0  PHQ - 2 Score 0 2 2 5  0  Altered sleeping - 0 1 1 -  Tired, decreased energy - 0 0 2 -  Change in appetite - 0 0 1 -  Feeling bad or failure about yourself  - 0 0 0 -  Trouble concentrating - 0 1 1 -  Moving slowly or fidgety/restless - 0 1 0 -  Suicidal thoughts - 0 0 0 -  PHQ-9 Score - 2 5 10  -  Some recent data might be hidden     5. Hyperlipidemia with target LDL less than 100  Does not watch diet  6. Hypokalemia  No c/o lower ext cramping  7. Insomnia, unspecified type  trazadone nightly to sleep  8. Non-Hodgkin's lymphoma, unspecified body region, unspecified non-Hodgkin lymphoma type [facility])  Has appointment on April 17 to see if she is in remission  9. Morbid obesity ([place])  No recent weight gain or loss  10. Peripheral edema  Takes lasic  daily which keeps under control most of the time  11. Seizures ([place])  On keppra- has not had seizure since december- sees neurologist every 6 months    New complaints: None today     Review of Systems  Constitutional: Negative for diaphoresis.  Eyes: Negative for pain.  Respiratory: Negative for shortness of breath.   Cardiovascular: Negative for chest pain, palpitations and leg swelling.  Gastrointestinal: Negative for abdominal pain.  Endocrine: Negative for polydipsia.  Skin: Negative for rash.  Neurological: Negative for dizziness, weakness and headaches.  Hematological: Does not bruise/bleed easily.  All other systems reviewed and are negative.      Objective:   Physical Exam  Constitutional: She is oriented to  person, place, and time. She appears well-developed and well-nourished.  HENT:  Head: Normocephalic.  Right Ear: Hearing, tympanic membrane, external ear and ear canal normal.  Left Ear: Hearing, tympanic membrane, external ear and ear canal normal.  Nose: Nose normal.  Mouth/Throat: Uvula is midline and oropharynx is clear and moist.  Eyes: Conjunctivae and EOM are normal. Pupils are equal, round, and reactive to light.  Neck: Normal range of motion and full passive range of motion without pain. Neck supple. No JVD present. Carotid bruit is not present. No thyroid mass and no thyromegaly present.  Cardiovascular: Normal rate, normal heart sounds and intact distal pulses.   No murmur heard. Pulmonary/Chest: Effort normal and breath sounds normal. Right breast exhibits no inverted nipple, no mass, no nipple discharge, no skin change and no tenderness. Left breast exhibits no inverted nipple, no mass, no nipple discharge, no skin change and no tenderness.  Abdominal: Soft. Bowel sounds are normal. She exhibits no mass. There is no tenderness.  Genitourinary: Vagina normal and uterus normal. No breast swelling, tenderness, discharge or bleeding.  Genitourinary Comments: bimanual exam-No adnexal masses or tenderness.   Musculoskeletal: Normal range of motion.  Right grip weaker then left FROM of right shoulder with pain on abduction  Lymphadenopathy:    She has no cervical adenopathy.  Neurological: She is alert and oriented to person, place, and time.  Skin: Skin is warm and dry.  Psychiatric: She has a normal mood and affect. Her behavior is normal. Judgment and thought content normal.    BP 96/65   Pulse (!) 56   Temp (!) 96.1 F (35.6 C) (Oral)   Ht 5\' 1"  (1.549 m)   Wt 192 lb (87.1 kg)   BMI 36.28 kg/m      Assessment & Plan:  1. SVT (supraventricular tachycardia) (HCC) Avoid caffeine  2. Neuropathy of right upper extremity  3. Pancytopenia ([place]) Keep follow up with  hematology  4. Depression, unspecified depression type Stress management  5. Hyperlipidemia with target LDL less than 100 Low fat diet - atorvastatin (LIPITOR) 40 MG tablet; Take 1 tablet (40 mg total) by mouth at bedtime.  Dispense: 30 tablet; Refill: 5  6. Hypokalemia  7. Insomnia, unspecified type Bedtime routine  8. Non-Hodgkin's lymphoma, unspecified body region, unspecified non-Hodgkin lymphoma type [facility]) Keep follow up with oncology  9. Morbid obesity ([place]) Discussed diet and exercise for person with BMI >25 Will recheck weight in 3-6 months  10. Peripheral edema Elevate legs when sitting - furosemide (LASIX) 40 MG tablet; TAKE 1 TABLET (40 MG TOTAL) BY MOUTH DAILY.  Dispense: 30 tablet; Refill: 5  11. Seizures ([place]) Keep follow up with neurology  12. Screening mammogram, encounter for - MM Digital Screening; Future  13. Recurrent major depressive  disorder, in full remission (HCC) - FLUoxetine (PROZAC) 40 MG capsule; Take 2 capsules (80 mg total) by mouth daily.  Dispense: 60 capsule; Refill: 5  14. Primary insomnia Bedtime routine - traZODone (DESYREL) 50 MG tablet; TAKE 1 TO 3 TABLETS AT BEDTIME  Dispense: 270 tablet; Refill: 1  15. Iron deficiency anemia, unspecified iron deficiency anemia type - Fe Fum-FePoly-Vit C-Vit B3 (INTEGRA) 62.5-62.5-40-3 MG CAPS; Take 1 capsule by mouth daily.  Dispense: 30 capsule; Refill: 3  16. Neuropathy of upper extremity, right - fentaNYL (DURAGESIC) 50 MCG/HR; Place 1 patch (50 mcg total) onto the skin every 3 (three) days.  Dispense: 10 patch; Refill: 0 - fentaNYL (DURAGESIC) 50 MCG/HR; Place 1 patch (50 mcg total) onto the skin every 3 (three) days.  Dispense: 10 patch; Refill: 0 - fentaNYL (DURAGESIC) 50 MCG/HR; Place 1 patch (50 mcg total) onto the skin every 3 (three) days.  Dispense: 5 patch; Refill: 0  17. Neuropathy of upper extremity, unspecified laterality - gabapentin (NEURONTIN) 300 MG capsule; TAKE 2 CAPSULE BY  MOUTH TID  Dispense: 180 capsule; Refill: 5  18. Varicose veins of lower extremities with ulcer, unspecified laterality (HCC) - levETIRAcetam (KEPPRA) 500 MG tablet; TAKE 2 TABLETS BY MOUTH 2 TIMES DAILY  Dispense: 120 tablet; Refill: 5    Labs pending Health maintenance reviewed Diet and exercise encouraged Continue all meds Follow up  In 3 months   [place], FNP

## 2016-07-22 NOTE — Addendum Note (Signed)
Addended by: Rolena Infante on: 07/22/2016 03:55 PM   Modules accepted: Orders

## 2016-07-23 LAB — ANEMIA PROFILE B
Basophils Absolute: 0 10*3/uL (ref 0.0–0.2)
Basos: 0 %
EOS (ABSOLUTE): 0.1 10*3/uL (ref 0.0–0.4)
EOS: 2 %
FERRITIN: 308 ng/mL — AB (ref 15–150)
Folate: 17 ng/mL (ref >3.0)
HEMOGLOBIN: 12.3 g/dL (ref 11.1–15.9)
Hematocrit: 35.7 % (ref 34.0–46.6)
IMMATURE GRANS (ABS): 0 10*3/uL (ref 0.0–0.1)
IRON: 63 ug/dL (ref 27–159)
Immature Granulocytes: 0 %
Iron Saturation: 26 % (ref 15–55)
Lymphocytes Absolute: 1 10*3/uL (ref 0.7–3.1)
Lymphs: 22 %
MCH: 32.7 pg (ref 26.6–33.0)
MCHC: 34.5 g/dL (ref 31.5–35.7)
MCV: 95 fL (ref 79–97)
MONOCYTES: 8 %
Monocytes Absolute: 0.4 10*3/uL (ref 0.1–0.9)
NEUTROS PCT: 68 %
Neutrophils Absolute: 3.1 10*3/uL (ref 1.4–7.0)
PLATELETS: 107 10*3/uL — AB (ref 150–379)
RBC: 3.76 x10E6/uL — ABNORMAL LOW (ref 3.77–5.28)
RDW: 13.3 % (ref 12.3–15.4)
RETIC CT PCT: 2.5 % (ref 0.6–2.6)
Total Iron Binding Capacity: 239 ug/dL — ABNORMAL LOW (ref 250–450)
UIBC: 176 ug/dL (ref 131–425)
VITAMIN B 12: 494 pg/mL (ref 232–1245)
WBC: 4.5 10*3/uL (ref 3.4–10.8)

## 2016-07-23 LAB — LIPID PANEL
Chol/HDL Ratio: 3 ratio (ref 0.0–4.4)
Cholesterol, Total: 143 mg/dL (ref 100–199)
HDL: 47 mg/dL (ref >39)
LDL CALC: 73 mg/dL (ref 0–99)
Triglycerides: 114 mg/dL (ref 0–149)
VLDL CHOLESTEROL CAL: 23 mg/dL (ref 5–40)

## 2016-08-25 ENCOUNTER — Encounter: Payer: Self-pay | Admitting: Family Medicine

## 2016-08-25 ENCOUNTER — Ambulatory Visit: Payer: Medicaid Other | Admitting: Family Medicine

## 2016-08-25 ENCOUNTER — Ambulatory Visit (INDEPENDENT_AMBULATORY_CARE_PROVIDER_SITE_OTHER): Payer: Medicaid Other | Admitting: Family Medicine

## 2016-08-25 VITALS — BP 130/83 | HR 54 | Temp 97.1°F | Ht 61.0 in | Wt 193.8 lb

## 2016-08-25 DIAGNOSIS — L237 Allergic contact dermatitis due to plants, except food: Secondary | ICD-10-CM | POA: Diagnosis not present

## 2016-08-25 MED ORDER — TRIAMCINOLONE ACETONIDE 0.1 % EX CREA: 1.0000 "application " | TOPICAL_CREAM | Freq: Two times a day (BID) | CUTANEOUS | 0 refills | Status: DC

## 2016-08-25 MED ORDER — TRIAMCINOLONE ACETONIDE 40 MG/ML IJ SUSP
40.0000 mg | Freq: Once | INTRAMUSCULAR | Status: AC
  Administered 2016-08-25: 40 mg via INTRAMUSCULAR

## 2016-08-25 NOTE — Progress Notes (Signed)
   HPI  Patient presents today here with an itchy rash.  Patient explains that it began after she sat down in the yard to do some gardening. She has allergy to poison ivy and poison oak. She states that after she began to itch she saw that there was poison oak that had been mowed over that she had sat on.  She describes itchy rash on the bilateral posterior thighs as well as the buttock. It has not spread to the groin or any other areas of the body.  She has previously done very well with injection of steroids which she requests today.  PMH: Smoking status noted ROS: Per HPI  Objective: BP 130/83   Pulse (!) 54   Temp 97.1 F (36.2 C) (Oral)   Ht 5\' 1"  (1.549 m)   Wt 193 lb 12.8 oz (87.9 kg)   BMI 36.62 kg/m  Gen: NAD, alert, cooperative with exam HEENT: NCAT CV: RRR, good S1/S2, no murmur Resp: CTABL, no wheezes, non-labored Neuro: Alert and oriented, No gross deficits  Skin:  Erythematous maculopapular rash on the bilateral posterior thigh and bilateral buttock sparing the gluteal cleft and inner thighs Exam performed with my CMA present, Jessica Rostosky  Assessment and plan:  # Contact dermatitis, poison ivy dermatitis Considering rash on the buttock we have treated her with IM Kenalog today, also given cream for as needed treatment explaining to avoid the groin, underarms, and face. Return to clinic as needed or symptoms return or do not improve as expected.   Meds ordered this encounter  Medications  . triamcinolone cream (KENALOG) 0.1 %    Sig: Apply 1 application topically 2 (two) times daily. Avoid groin, underarms or face    Dispense:  80 g    Refill:  0    Laroy Apple, MD [place] 08/25/2016, 10:25 AM

## 2016-08-25 NOTE — Patient Instructions (Signed)
Great to meet you!   Poison Ivy Dermatitis Poison ivy dermatitis is redness and soreness (inflammation) of the skin. It is caused by a chemical that is found on the leaves of the poison ivy plant. You may also have itching, a rash, and blisters. Symptoms often clear up in 1-2 weeks. You may get this condition by touching a poison ivy plant. You can also get it by touching something that has the chemical on it. This may include animals or objects that have come in contact with the plant. Follow these instructions at home: General instructions   Take or apply over-the-counter and prescription medicines only as told by your doctor.  If you touch poison ivy, wash your skin with soap and cold water right away.  Use hydrocortisone creams or calamine lotion as needed to help with itching.  Take oatmeal baths as needed. Use colloidal oatmeal. You can get this at a pharmacy or grocery store. Follow the instructions on the package.  Do not scratch or rub your skin.  While you have the rash, wash your clothes right after you wear them. Prevention   Know what poison ivy looks like so you can avoid it. This plant has three leaves with flowering branches on a single stem. The leaves are glossy. They have uneven edges that come to a point at the front.  If you have touched poison ivy, wash with soap and water right away. Be sure to wash under your fingernails.  When hiking or camping, wear long pants, a long-sleeved shirt, tall socks, and hiking boots. You can also use a lotion on your skin that helps to prevent contact with the chemical on the plant.  If you think that your clothes or outdoor gear came in contact with poison ivy, rinse them off with a garden hose before you bring them inside your house. Contact a doctor if:  You have open sores in the rash area.  You have more redness, swelling, or pain in the affected area.  You have redness that spreads beyond the rash area.  You have fluid,  blood, or pus coming from the affected area.  You have a fever.  You have a rash over a large area of your body.  You have a rash on your eyes, mouth, or genitals.  Your rash does not get better after a few days. Get help right away if:  Your face swells or your eyes swell shut.  You have trouble breathing.  You have trouble swallowing. This information is not intended to replace advice given to you by your health care provider. Make sure you discuss any questions you have with your health care provider. Document Released: 05/03/2010 Document Revised: 09/06/2015 Document Reviewed: 09/06/2014 Elsevier Interactive Patient Education  2017 [employer].

## 2016-08-28 ENCOUNTER — Telehealth: Payer: Self-pay | Admitting: Family Medicine

## 2016-08-28 MED ORDER — TRIAMCINOLONE ACETONIDE 0.5 % EX OINT: 1.0000 "application " | TOPICAL_OINTMENT | Freq: Two times a day (BID) | CUTANEOUS | 0 refills | Status: DC

## 2016-08-28 NOTE — Telephone Encounter (Signed)
Seen her husband in [place], he requests more cream, Rash on thighs are worsening.   Laroy Apple, MD [facility] 08/28/2016, 8:28 AM

## 2016-09-05 ENCOUNTER — Other Ambulatory Visit: Payer: Self-pay | Admitting: Nurse Practitioner

## 2016-09-10 ENCOUNTER — Other Ambulatory Visit: Payer: Self-pay | Admitting: *Deleted

## 2016-09-10 DIAGNOSIS — D509 Iron deficiency anemia, unspecified: Secondary | ICD-10-CM

## 2016-09-10 MED ORDER — INTEGRA 62.5-62.5-40-3 MG PO CAPS: 1.0000 | ORAL_CAPSULE | Freq: Every day | ORAL | 1 refills | Status: DC

## 2016-09-24 ENCOUNTER — Telehealth: Payer: Self-pay | Admitting: Nurse Practitioner

## 2016-09-24 ENCOUNTER — Other Ambulatory Visit: Payer: Self-pay | Admitting: Nurse Practitioner

## 2016-09-24 NOTE — Telephone Encounter (Signed)
Spoke with Sapana at [facility] to inform they should have RX for June refill

## 2016-10-01 ENCOUNTER — Encounter: Payer: Self-pay | Admitting: Cardiology

## 2016-10-01 NOTE — Progress Notes (Signed)
Cardiology Office Note   Date:  10/03/2016   ID:  Angela Michael, DOB 08-11-1960, MRN [id number]  PCP:  Chevis Pretty, FNP  Cardiologist:   Minus Breeding, MD    Chief Complaint  Patient presents with  . Coronary Artery Disease      History of Present Illness: Angela Michael is a 56 y.o. female who presents for follow up of non obstructive CAD.  Since she was last seen she denies any further cardiovascular symptoms.  The patient denies any new symptoms such as chest discomfort, neck or arm discomfort. There has been no new shortness of breath, PND or orthopnea. There have been no reported palpitations, presyncope or syncope.  She does some walking and takes the grandchildren to the park.     I last saw her in 2010. She had chest pain with a cardiac cath demonstrating mild coronary plaque.  She was seen by Dr. Bronson Ing in 2016.   She had a negative nuclear stress test at [facility] in August 2016.  She had a cardiac cath at [facility] in 2017.  Her disease had progress to RCA 50% stenosis, 30% prox and mild LAD.  FFR of the LAD was 0.89.   She also has had a history of elevated pulmonary pressures, sleep apnea, obesity, DVT with an IVC filter and SVT.    Past Medical History:  Diagnosis Date  . Anemia   . CAD (coronary artery disease)    RCA 50%, LAD 30% cath 2017.   Marland Kitchen Chronic back pain   . Chronic neck pain   . Chronic pain   . Depression   . Diabetes mellitus without complication ([place]) 3/41/9622   patient denies being diabetic, no meds  . History of syncope   . Hyperlipidemia   . Lymphoma ([place])   . Neuropathy   . Peripheral edema   . PVC's (premature ventricular contractions)   . Seizures ([place])   . SVT (supraventricular tachycardia) (HCC)     Past Surgical History:  Procedure Laterality Date  . CATARACT EXTRACTION Right   . CHOLECYSTECTOMY    . ENDOVENOUS ABLATION SAPHENOUS VEIN W/ LASER Right 09-08-2013   EVLA right small saphenous vein and stab phlebectomy 10-20  incisions right leg by Curt Jews MD  . GASTRIC BYPASS     Says she had lost 345 lbs  . LYMPH NODE DISSECTION Left    Resected  . lymph node removed from stomach    . MEDIAL PARTIAL KNEE REPLACEMENT  05/10/14   left knee  . MEDIAL PARTIAL KNEE REPLACEMENT Left 06/04/2015   [facility]  . SKIN SURGERY     Removal of excess skin  . VENA CAVA FILTER PLACEMENT    . VENOUS THROMBECTOMY     Blood clot resection from right arm     Current Outpatient Prescriptions  Medication Sig Dispense Refill  . Ascorbic Acid (VITAMIN C) 1000 MG tablet Take 1,000 mg by mouth daily. Reported on 06/21/2015    . aspirin 81 MG tablet Take 81 mg by mouth daily. Reported on 06/21/2015    . atorvastatin (LIPITOR) 40 MG tablet Take 1 tablet (40 mg total) by mouth at bedtime. 30 tablet 5  . cyanocobalamin (,VITAMIN B-12,) 1000 MCG/ML injection INJECT 1 ML (1,000 MCG TOTAL) INTO THE MUSCLE EVERY 30 (THIRTY) DAYS. 30 mL 0  . cyclobenzaprine (FLEXERIL) 5 MG tablet Take 1 tablet (5 mg total) by mouth 3 (three) times daily as needed for muscle spasms. 90 tablet 0  .  Fe Fum-FePoly-Vit C-Vit B3 (INTEGRA) 62.5-62.5-40-3 MG CAPS Take 1 capsule by mouth daily. 90 capsule 1  . fentaNYL (DURAGESIC) 50 MCG/HR Place 1 patch (50 mcg total) onto the skin every 3 (three) days. 10 patch 0  . FLUoxetine (PROZAC) 40 MG capsule Take 2 capsules (80 mg total) by mouth daily. 60 capsule 5  . fluticasone (FLONASE) 50 MCG/ACT nasal spray PLACE 2 SPRAYS INTO THE NOSE AS NEEDED. FOR ALLERGIES 16 g 3  . furosemide (LASIX) 40 MG tablet TAKE 1 TABLET (40 MG TOTAL) BY MOUTH DAILY. 30 tablet 5  . gabapentin (NEURONTIN) 300 MG capsule TAKE 2 CAPSULE BY MOUTH TID 180 capsule 5  . levETIRAcetam (KEPPRA) 500 MG tablet TAKE 2 TABLETS BY MOUTH 2 TIMES DAILY 120 tablet 5  . loratadine (CLARITIN) 10 MG tablet Take 10 mg by mouth as needed. For allergies    . Multiple Vitamin (MULTIVITAMIN WITH MINERALS) TABS Take 1 tablet by mouth daily. Reported on 06/21/2015    .  nitroGLYCERIN (NITROSTAT) 0.4 MG SL tablet Place 1 tablet (0.4 mg total) under the tongue every 5 (five) minutes as needed for chest pain. 25 tablet 3  . Nutritional Supplements (ESTROVEN PMS) TABS Take 1 tablet by mouth daily.     . traZODone (DESYREL) 50 MG tablet TAKE 1 TO 3 TABLETS AT BEDTIME 270 tablet 1  . triamcinolone ointment (KENALOG) 0.5 % Apply 1 application topically 2 (two) times daily. 60 g 0   No current facility-administered medications for this visit.     Allergies:   Advicor [niacin-lovastatin er]    ROS:  Please see the history of present illness.   Otherwise, review of systems are positive for none.   All other systems are reviewed and negative.    PHYSICAL EXAM: VS:  BP 113/67   Pulse (!) 40   Ht 5\' 1"  (1.549 m)   Wt 194 lb (88 kg)   BMI 36.66 kg/m  , BMI Body mass index is 36.66 kg/m. GENERAL:  Well appearing NECK:  No jugular venous distention, waveform within normal limits, carotid upstroke brisk and symmetric, no bruits, no thyromegaly LUNGS:  Clear to auscultation bilaterally BACK:  No CVA tenderness CHEST:  Unremarkable HEART:  PMI not displaced or sustained,S1 and S2 within normal limits, no S3, no S4, no clicks, no rubs, no murmurs ABD:  Flat, positive bowel sounds normal in frequency in pitch, no bruits, no rebound, no guarding, no midline pulsatile mass, no hepatomegaly, no splenomegaly EXT:  2 plus pulses throughout, no edema, no cyanosis no clubbing   EKG:  EKG is not ordered today.    Recent Labs: 06/20/2016: ALT 22; BUN 19; Creatinine, Ser 0.75; Potassium 3.9; Sodium 142 07/22/2016: Hemoglobin 12.3; Platelets 107    Lipid Panel    Component Value Date/Time   CHOL 143 07/22/2016 1556   CHOL 150 08/06/2012 0950   TRIG 114 07/22/2016 1556   TRIG 93 06/23/2014 1242   TRIG 103 08/06/2012 0950   HDL 47 07/22/2016 1556   HDL 56 06/23/2014 1242   HDL 44 08/06/2012 0950   CHOLHDL 3.0 07/22/2016 1556   LDLCALC 73 07/22/2016 1556   LDLCALC  78 12/05/2013 1022   LDLCALC 85 08/06/2012 0950      Wt Readings from Last 3 Encounters:  10/02/16 194 lb (88 kg)  08/25/16 193 lb 12.8 oz (87.9 kg)  07/22/16 192 lb (87.1 kg)      Other studies Reviewed: Additional studies/ records that were reviewed today include:  [place] Records. Review of the above records demonstrates:  Please see elsewhere in the note.     ASSESSMENT AND PLAN:   CAD: The patient has no new sypmtoms.  No further cardiovascular testing is indicated.  We will continue with aggressive risk reduction and meds as listed.  SVT:   She has had no symptomatic recurrence of this.   No change in therapy is planned  DYSLIPIDEMIA:    LDL was 73.  HDL was 47.  She will remain on the meds as listed.    DVT:  She has no symptoms related to this.  No change in therapy is planned  BRADYCARDIA:  She has no symptomatic bradycardia.  No further testing is indicated.    Current medicines are reviewed at length with the patient today.  The patient does not have concerns regarding medicines.  The following changes have been made:  no change  Labs/ tests ordered today include: None No orders of the defined types were placed in this encounter.    Disposition:   FU with me in 18 months.     Signed, Minus Breeding, MD  10/03/2016 7:27 AM    [facility]

## 2016-10-02 ENCOUNTER — Ambulatory Visit (INDEPENDENT_AMBULATORY_CARE_PROVIDER_SITE_OTHER): Payer: Medicaid Other | Admitting: Cardiology

## 2016-10-02 ENCOUNTER — Encounter: Payer: Self-pay | Admitting: Cardiology

## 2016-10-02 ENCOUNTER — Encounter: Payer: Self-pay | Admitting: *Deleted

## 2016-10-02 VITALS — BP 113/67 | HR 40 | Ht 61.0 in | Wt 194.0 lb

## 2016-10-02 DIAGNOSIS — I2510 Atherosclerotic heart disease of native coronary artery without angina pectoris: Secondary | ICD-10-CM

## 2016-10-02 DIAGNOSIS — E785 Hyperlipidemia, unspecified: Secondary | ICD-10-CM

## 2016-10-02 MED ORDER — NITROGLYCERIN 0.4 MG SL SUBL: 0.4000 mg | SUBLINGUAL_TABLET | SUBLINGUAL | 3 refills | Status: DC | PRN

## 2016-10-02 NOTE — Patient Instructions (Signed)
Medication Instructions:  Continue all current medications.  Labwork: none  Testing/Procedures: none  Follow-Up: Your physician wants you to follow up in:  1 year (18 months).  You will receive a reminder letter in the mail one-two months in advance.  If you don't receive a letter, please call our office to schedule the follow up appointment   Any Other Special Instructions Will Be Listed Below (If Applicable).  If you need a refill on your cardiac medications before your next appointment, please call your pharmacy.

## 2016-10-03 ENCOUNTER — Encounter: Payer: Self-pay | Admitting: Cardiology

## 2016-10-21 ENCOUNTER — Other Ambulatory Visit: Payer: Self-pay | Admitting: Pediatrics

## 2016-10-27 ENCOUNTER — Ambulatory Visit (INDEPENDENT_AMBULATORY_CARE_PROVIDER_SITE_OTHER): Payer: Medicaid Other | Admitting: Nurse Practitioner

## 2016-10-27 ENCOUNTER — Encounter: Payer: Self-pay | Admitting: Nurse Practitioner

## 2016-10-27 VITALS — BP 114/80 | HR 60 | Temp 97.5°F | Ht 61.0 in | Wt 191.0 lb

## 2016-10-27 DIAGNOSIS — E785 Hyperlipidemia, unspecified: Secondary | ICD-10-CM

## 2016-10-27 DIAGNOSIS — R6510 Systemic inflammatory response syndrome (SIRS) of non-infectious origin without acute organ dysfunction: Secondary | ICD-10-CM

## 2016-10-27 DIAGNOSIS — I8393 Asymptomatic varicose veins of bilateral lower extremities: Secondary | ICD-10-CM

## 2016-10-27 DIAGNOSIS — R569 Unspecified convulsions: Secondary | ICD-10-CM | POA: Diagnosis not present

## 2016-10-27 DIAGNOSIS — G4700 Insomnia, unspecified: Secondary | ICD-10-CM | POA: Diagnosis not present

## 2016-10-27 DIAGNOSIS — D61818 Other pancytopenia: Secondary | ICD-10-CM

## 2016-10-27 DIAGNOSIS — R609 Edema, unspecified: Secondary | ICD-10-CM

## 2016-10-27 DIAGNOSIS — B372 Candidiasis of skin and nail: Secondary | ICD-10-CM

## 2016-10-27 DIAGNOSIS — F32A Depression, unspecified: Secondary | ICD-10-CM

## 2016-10-27 DIAGNOSIS — I471 Supraventricular tachycardia: Secondary | ICD-10-CM | POA: Diagnosis not present

## 2016-10-27 DIAGNOSIS — G5691 Unspecified mononeuropathy of right upper limb: Secondary | ICD-10-CM

## 2016-10-27 DIAGNOSIS — E876 Hypokalemia: Secondary | ICD-10-CM

## 2016-10-27 DIAGNOSIS — C8590 Non-Hodgkin lymphoma, unspecified, unspecified site: Secondary | ICD-10-CM

## 2016-10-27 DIAGNOSIS — F329 Major depressive disorder, single episode, unspecified: Secondary | ICD-10-CM

## 2016-10-27 MED ORDER — FENTANYL 50 MCG/HR TD PT72: 50.0000 ug | MEDICATED_PATCH | TRANSDERMAL | 0 refills | Status: DC

## 2016-10-27 MED ORDER — FENTANYL 50 MCG/HR TD PT72
50.0000 ug | MEDICATED_PATCH | TRANSDERMAL | 0 refills | Status: DC
Start: 2016-10-27 — End: 2016-12-10

## 2016-10-27 NOTE — Patient Instructions (Signed)
Skin Yeast Infection Skin yeast infection is a condition in which there is an overgrowth of yeast (candida) that normally lives on the skin. This condition usually occurs in areas of the skin that are constantly warm and moist, such as the armpits or the groin. What are the causes? This condition is caused by a change in the normal balance of the yeast and bacteria that live on the skin. What increases the risk? This condition is more likely to develop in:  People who are obese.  Pregnant women.  Women who take birth control pills.  People who have diabetes.  People who take antibiotic medicines.  People who take steroid medicines.  People who are malnourished.  People who have a weak defense (immune) system.  People who are 65 years of age or older.  What are the signs or symptoms? Symptoms of this condition include:  A red, swollen area of the skin.  Bumps on the skin.  Itchiness.  How is this diagnosed? This condition is diagnosed with a medical history and physical exam. Your health care provider may check for yeast by taking light scrapings of the skin to be viewed under a microscope. How is this treated? This condition is treated with medicine. Medicines may be prescribed or be available over-the-counter. The medicines may be:  Taken by mouth (orally).  Applied as a cream.  Follow these instructions at home:  Take or apply over-the-counter and prescription medicines only as told by your health care provider.  Eat more yogurt. This may help to keep your yeast infection from returning.  Maintain a healthy weight. If you need help losing weight, talk with your health care provider.  Keep your skin clean and dry.  If you have diabetes, keep your blood sugar under control. Contact a health care provider if:  Your symptoms go away and then return.  Your symptoms do not get better with treatment.  Your symptoms get worse.  Your rash spreads.  You have a  fever or chills.  You have new symptoms.  You have new warmth or redness of your skin. This information is not intended to replace advice given to you by your health care provider. Make sure you discuss any questions you have with your health care provider. Document Released: 12/17/2010 Document Revised: 11/25/2015 Document Reviewed: 10/02/2014 Elsevier Interactive Patient Education  2018 [employer]  

## 2016-10-27 NOTE — Progress Notes (Signed)
Subjective:    Patient ID: Angela Michael, female    DOB: [birth year], 56 y.o.   MRN: [id number]  HPI  Angela Michael is here today for follow up of chronic medical problem.  Outpatient Encounter Prescriptions as of 10/27/2016  Medication Sig  . Ascorbic Acid (VITAMIN C) 1000 MG tablet Take 1,000 mg by mouth daily. Reported on 06/21/2015  . aspirin 81 MG tablet Take 81 mg by mouth daily. Reported on 06/21/2015  . atorvastatin (LIPITOR) 40 MG tablet Take 1 tablet (40 mg total) by mouth at bedtime.  . cyanocobalamin (,VITAMIN B-12,) 1000 MCG/ML injection INJECT 1 ML (1,000 MCG TOTAL) INTO THE MUSCLE EVERY 30 (THIRTY) DAYS.  Marland Kitchen cyclobenzaprine (FLEXERIL) 5 MG tablet TAKE 1 TABLET BY MOUTH THREE TIMES A DAY AS NEEDED FOR MUSCLE SPASMS  . Fe Fum-FePoly-Vit C-Vit B3 (INTEGRA) 62.5-62.5-40-3 MG CAPS Take 1 capsule by mouth daily.  . fentaNYL (DURAGESIC) 50 MCG/HR Place 1 patch (50 mcg total) onto the skin every 3 (three) days.  Marland Kitchen FLUoxetine (PROZAC) 40 MG capsule Take 2 capsules (80 mg total) by mouth daily.  . fluticasone (FLONASE) 50 MCG/ACT nasal spray PLACE 2 SPRAYS INTO THE NOSE AS NEEDED. FOR ALLERGIES  . furosemide (LASIX) 40 MG tablet TAKE 1 TABLET (40 MG TOTAL) BY MOUTH DAILY.  Marland Kitchen gabapentin (NEURONTIN) 300 MG capsule TAKE 2 CAPSULE BY MOUTH TID  . levETIRAcetam (KEPPRA) 500 MG tablet TAKE 2 TABLETS BY MOUTH 2 TIMES DAILY  . loratadine (CLARITIN) 10 MG tablet Take 10 mg by mouth as needed. For allergies  . Multiple Vitamin (MULTIVITAMIN WITH MINERALS) TABS Take 1 tablet by mouth daily. Reported on 06/21/2015  . nitroGLYCERIN (NITROSTAT) 0.4 MG SL tablet Place 1 tablet (0.4 mg total) under the tongue every 5 (five) minutes as needed for chest pain.  . Nutritional Supplements (ESTROVEN PMS) TABS Take 1 tablet by mouth daily.   . traZODone (DESYREL) 50 MG tablet TAKE 1 TO 3 TABLETS AT BEDTIME  . [DISCONTINUED] triamcinolone ointment (KENALOG) 0.5 % Apply 1 application topically 2 (two) times  daily.   No facility-administered encounter medications on file as of 10/27/2016.     1. Varicose veins of both lower extremities  Multiple varicosities on lower ext. Does have leg pain if she is walking for long periods of time  2. SVT (supraventricular tachycardia) (HCC)  No recent episodes  3. Neuropathy of right upper extremity  Right arm hurts all the time. Uses fentanyl patch 18mg- had overdose of fentanyl in last 6 months and dose was decreased. She is also no longer on oral opoids.  4. [place]topenia ([place]  Needs blood work checked. Feels fatigued all the time  5. SIRS (systemic inflammatory response syndrome) (HCC)  Not sure of what is causing this but sees rheumatologist  6. Seizures ([place]  No recent seizure activity  7. Peripheral edema  Edema wil resolve if she sits with legs propped up.  8. Morbid obesity ([place]  Had gastric bypass  Many years ago and weight is gradually returning  9. Non-Hodgkin's lymphoma, unspecified body region, unspecified non-Hodgkin lymphoma type ([facility] sees oncologist  10. Insomnia, unspecified type  trazadone works well to help her sleep  11. Hypokalemia  Has occasional leg cramps  12. Hyperlipidemia with target LDL less than 100  Does nt really watch diet very closely  13. Depression, unspecified depression type  Is still on prozac. Works ok- does not want to change meds [facility] 10/27/2016 08/25/2016 07/22/2016 06/20/2016 05/08/2016  Decreased Interest 0 0 0 1 0  Down, Depressed, Hopeless 0 0 0 1 2  PHQ - 2 Score 0 0 0 2 2  Altered sleeping - - - 0 1  Tired, decreased energy - - - 0 0  Change in appetite - - - 0 0  Feeling bad or failure about yourself  - - - 0 0  Trouble concentrating - - - 0 1  Moving slowly or fidgety/restless - - - 0 1  Suicidal thoughts - - - 0 0  PHQ-9 Score - - - 2 5  Some recent data might be hidden       New complaints: None today     Review of Systems  Constitutional: Negative for activity  change and appetite change.  HENT: Negative.   Eyes: Negative for pain.  Respiratory: Negative for shortness of breath.   Cardiovascular: Negative for chest pain, palpitations and leg swelling.  Gastrointestinal: Negative for abdominal pain.  Endocrine: Negative for polydipsia.  Genitourinary: Negative.   Musculoskeletal: Positive for back pain and myalgias.  Skin: Negative for rash.  Neurological: Negative for dizziness, weakness and headaches.  Hematological: Does not bruise/bleed easily.  Psychiatric/Behavioral: Negative.   All other systems reviewed and are negative.      Objective:   Physical Exam  Constitutional: She is oriented to person, place, and time. She appears well-developed and well-nourished.  HENT:  Nose: Nose normal.  Mouth/Throat: Oropharynx is clear and moist.  Eyes: EOM are normal.  Neck: Trachea normal, normal range of motion and full passive range of motion without pain. Neck supple. No JVD present. Carotid bruit is not present. No thyromegaly present.  Cardiovascular: Normal rate, regular rhythm, normal heart sounds and intact distal pulses.  Exam reveals no gallop and no friction rub.   No murmur heard. Pulmonary/Chest: Effort normal and breath sounds normal.  Abdominal: Soft. Bowel sounds are normal. She exhibits no distension and no mass. There is no tenderness.  Pendulous abdomen form past weight loss  Musculoskeletal: Normal range of motion.  Lymphadenopathy:    She has no cervical adenopathy.  Neurological: She is alert and oriented to person, place, and time. She has normal reflexes.  Skin: Skin is warm and dry.  Yeast under abdominal fold  Psychiatric: She has a normal mood and affect. Her behavior is normal. Judgment and thought content normal.   BP 114/80   Pulse 60   Temp (!) 97.5 F (36.4 C) (Oral)   Ht 5' 1"  (1.549 m)   Wt 191 lb (86.6 kg)   BMI 36.09 kg/m         Assessment & Plan:   1. Varicose veins of both lower  extremities - Ambulatory referral to Vascular Surgery  2. SVT (supraventricular tachycardia) (HCC) Avoid caffeine  3. Pancytopenia ([place]) Labs pending - Anemia Profile B  4. SIRS (systemic inflammatory response syndrome) (HCC) Keep follow up with rheumatology  5. Seizures ([place])  6. Peripheral edema Elevate legs when sitting  7. Morbid obesity ([place]) Discussed diet and exercise for person with BMI >25 Will recheck weight in 3-6 months  8. Non-Hodgkin's lymphoma, unspecified body region, unspecified non-Hodgkin lymphoma type [facility]) Keep follow up with oncology  9. Insomnia, unspecified type Bedtime routine  10. Hypokalemia  11. Hyperlipidemia with target LDL less than 100 Low fta diet - CMP14+EGFR - Lipid panel  12. Depression, unspecified depression type Continue prozac and stress management  13. Neuropathy of upper extremity, right - fentaNYL (DURAGESIC) 50 MCG/HR;  Place 1 patch (50 mcg total) onto the skin every 3 (three) days.  Dispense: 10 patch; Refill: 0 - fentaNYL (DURAGESIC) 50 MCG/HR; Place 1 patch (50 mcg total) onto the skin every 3 (three) days.  Dispense: 5 patch; Refill: 0 - fentaNYL (DURAGESIC) 50 MCG/HR; Place 1 patch (50 mcg total) onto the skin every 3 (three) days.  Dispense: 5 patch; Refill: 0  14. Cutaneous candidiasis/ due to pendulous abdominal skin Continue monistat OTC - Ambulatory referral to Plastic Surgery    Labs pending Health maintenance reviewed Diet and exercise encouraged Continue all meds Follow up  In 3 months   [place], FNP

## 2016-10-27 NOTE — Addendum Note (Signed)
Addended by: Chevis Pretty on: 10/27/2016 10:58 AM   Modules accepted: Orders

## 2016-10-28 ENCOUNTER — Other Ambulatory Visit: Payer: Self-pay

## 2016-10-28 DIAGNOSIS — I83813 Varicose veins of bilateral lower extremities with pain: Secondary | ICD-10-CM

## 2016-10-29 ENCOUNTER — Other Ambulatory Visit: Payer: Self-pay | Admitting: Nurse Practitioner

## 2016-10-29 LAB — ANEMIA PROFILE B
FERRITIN: 431 ng/mL — AB (ref 15–150)
IRON SATURATION: 40 % (ref 15–55)
Iron: 99 ug/dL (ref 27–159)
Total Iron Binding Capacity: 247 ug/dL — ABNORMAL LOW (ref 250–450)
UIBC: 148 ug/dL (ref 131–425)
VITAMIN B 12: 520 pg/mL (ref 232–1245)

## 2016-10-29 LAB — CMP14+EGFR
ALT: 28 IU/L (ref 0–32)
AST: 26 IU/L (ref 0–40)
Albumin/Globulin Ratio: 2 (ref 1.2–2.2)
Albumin: 4.6 g/dL (ref 3.5–5.5)
Alkaline Phosphatase: 124 IU/L — ABNORMAL HIGH (ref 39–117)
BUN/Creatinine Ratio: 22 (ref 9–23)
BUN: 19 mg/dL (ref 6–24)
Bilirubin Total: 1.3 mg/dL — ABNORMAL HIGH (ref 0.0–1.2)
CALCIUM: 9.3 mg/dL (ref 8.7–10.2)
CO2: 30 mmol/L — AB (ref 20–29)
CREATININE: 0.86 mg/dL (ref 0.57–1.00)
Chloride: 95 mmol/L — ABNORMAL LOW (ref 96–106)
GFR calc Af Amer: 89 mL/min/{1.73_m2} (ref >59)
GFR, EST NON AFRICAN AMERICAN: 77 mL/min/{1.73_m2} (ref >59)
GLUCOSE: 103 mg/dL — AB (ref 65–99)
Globulin, Total: 2.3 g/dL (ref 1.5–4.5)
POTASSIUM: 3.3 mmol/L — AB (ref 3.5–5.2)
Sodium: 143 mmol/L (ref 134–144)
Total Protein: 6.9 g/dL (ref 6.0–8.5)

## 2016-10-29 LAB — LIPID PANEL
CHOL/HDL RATIO: 2.7 ratio (ref 0.0–4.4)
Cholesterol, Total: 145 mg/dL (ref 100–199)
HDL: 54 mg/dL (ref >39)
LDL CALC: 67 mg/dL (ref 0–99)
TRIGLYCERIDES: 120 mg/dL (ref 0–149)
VLDL Cholesterol Cal: 24 mg/dL (ref 5–40)

## 2016-10-29 MED ORDER — POTASSIUM CHLORIDE CRYS ER 20 MEQ PO TBCR: 20.0000 meq | EXTENDED_RELEASE_TABLET | Freq: Two times a day (BID) | ORAL | 3 refills | Status: DC

## 2016-10-31 ENCOUNTER — Other Ambulatory Visit: Payer: Medicaid Other

## 2016-10-31 DIAGNOSIS — D509 Iron deficiency anemia, unspecified: Secondary | ICD-10-CM

## 2016-11-01 LAB — ANEMIA PROFILE B
BASOS ABS: 0 10*3/uL (ref 0.0–0.2)
Basos: 0 %
EOS (ABSOLUTE): 0.1 10*3/uL (ref 0.0–0.4)
Eos: 3 %
FOLATE: 17 ng/mL (ref >3.0)
Ferritin: 404 ng/mL — ABNORMAL HIGH (ref 15–150)
HEMOGLOBIN: 12.7 g/dL (ref 11.1–15.9)
Hematocrit: 36.6 % (ref 34.0–46.6)
IMMATURE GRANS (ABS): 0 10*3/uL (ref 0.0–0.1)
IMMATURE GRANULOCYTES: 0 %
IRON: 68 ug/dL (ref 27–159)
Iron Saturation: 31 % (ref 15–55)
LYMPHS ABS: 1.2 10*3/uL (ref 0.7–3.1)
LYMPHS: 25 %
MCH: 33.4 pg — ABNORMAL HIGH (ref 26.6–33.0)
MCHC: 34.7 g/dL (ref 31.5–35.7)
MCV: 96 fL (ref 79–97)
MONOCYTES: 7 %
Monocytes Absolute: 0.4 10*3/uL (ref 0.1–0.9)
NEUTROS PCT: 65 %
Neutrophils Absolute: 3.2 10*3/uL (ref 1.4–7.0)
PLATELETS: 130 10*3/uL — AB (ref 150–379)
RBC: 3.8 x10E6/uL (ref 3.77–5.28)
RDW: 12.8 % (ref 12.3–15.4)
RETIC CT PCT: 2.2 % (ref 0.6–2.6)
TIBC: 219 ug/dL — AB (ref 250–450)
UIBC: 151 ug/dL (ref 131–425)
Vitamin B-12: 1323 pg/mL — ABNORMAL HIGH (ref 232–1245)
WBC: 4.9 10*3/uL (ref 3.4–10.8)

## 2016-11-23 ENCOUNTER — Other Ambulatory Visit: Payer: Self-pay | Admitting: Nurse Practitioner

## 2016-12-10 ENCOUNTER — Inpatient Hospital Stay (HOSPITAL_COMMUNITY)
Admission: EM | Admit: 2016-12-10 | Discharge: 2016-12-12 | DRG: 390 | Disposition: A | Discharge status: Home / Self Care | Payer: Medicaid Other | Attending: Family Medicine | Admitting: Family Medicine

## 2016-12-10 ENCOUNTER — Emergency Department (HOSPITAL_COMMUNITY): Payer: Medicaid Other

## 2016-12-10 ENCOUNTER — Encounter (HOSPITAL_COMMUNITY): Payer: Self-pay

## 2016-12-10 DIAGNOSIS — G629 Polyneuropathy, unspecified: Secondary | ICD-10-CM | POA: Diagnosis present

## 2016-12-10 DIAGNOSIS — Z96652 Presence of left artificial knee joint: Secondary | ICD-10-CM | POA: Diagnosis present

## 2016-12-10 DIAGNOSIS — Z95828 Presence of other vascular implants and grafts: Secondary | ICD-10-CM

## 2016-12-10 DIAGNOSIS — Z8572 Personal history of non-Hodgkin lymphomas: Secondary | ICD-10-CM

## 2016-12-10 DIAGNOSIS — I2510 Atherosclerotic heart disease of native coronary artery without angina pectoris: Secondary | ICD-10-CM | POA: Diagnosis present

## 2016-12-10 DIAGNOSIS — Z7982 Long term (current) use of aspirin: Secondary | ICD-10-CM

## 2016-12-10 DIAGNOSIS — M542 Cervicalgia: Secondary | ICD-10-CM | POA: Diagnosis present

## 2016-12-10 DIAGNOSIS — Z79891 Long term (current) use of opiate analgesic: Secondary | ICD-10-CM

## 2016-12-10 DIAGNOSIS — I4581 Long QT syndrome: Secondary | ICD-10-CM | POA: Diagnosis present

## 2016-12-10 DIAGNOSIS — G40909 Epilepsy, unspecified, not intractable, without status epilepticus: Secondary | ICD-10-CM | POA: Diagnosis not present

## 2016-12-10 DIAGNOSIS — G894 Chronic pain syndrome: Secondary | ICD-10-CM | POA: Diagnosis not present

## 2016-12-10 DIAGNOSIS — D649 Anemia, unspecified: Secondary | ICD-10-CM | POA: Diagnosis present

## 2016-12-10 DIAGNOSIS — Z9884 Bariatric surgery status: Secondary | ICD-10-CM

## 2016-12-10 DIAGNOSIS — K56600 Partial intestinal obstruction, unspecified as to cause: Principal | ICD-10-CM | POA: Diagnosis present

## 2016-12-10 DIAGNOSIS — F329 Major depressive disorder, single episode, unspecified: Secondary | ICD-10-CM | POA: Diagnosis present

## 2016-12-10 DIAGNOSIS — R9431 Abnormal electrocardiogram [ECG] [EKG]: Secondary | ICD-10-CM | POA: Diagnosis present

## 2016-12-10 DIAGNOSIS — R569 Unspecified convulsions: Secondary | ICD-10-CM

## 2016-12-10 DIAGNOSIS — M549 Dorsalgia, unspecified: Secondary | ICD-10-CM | POA: Diagnosis present

## 2016-12-10 DIAGNOSIS — E162 Hypoglycemia, unspecified: Secondary | ICD-10-CM | POA: Diagnosis not present

## 2016-12-10 DIAGNOSIS — D696 Thrombocytopenia, unspecified: Secondary | ICD-10-CM | POA: Diagnosis present

## 2016-12-10 DIAGNOSIS — K56609 Unspecified intestinal obstruction, unspecified as to partial versus complete obstruction: Secondary | ICD-10-CM | POA: Diagnosis present

## 2016-12-10 DIAGNOSIS — E785 Hyperlipidemia, unspecified: Secondary | ICD-10-CM | POA: Diagnosis present

## 2016-12-10 HISTORY — DX: Asymptomatic varicose veins of bilateral lower extremities: I83.93

## 2016-12-10 LAB — BASIC METABOLIC PANEL
Anion gap: 14 (ref 5–15)
BUN: 16 mg/dL (ref 6–20)
CALCIUM: 10.2 mg/dL (ref 8.9–10.3)
CO2: 22 mmol/L (ref 22–32)
CREATININE: 0.87 mg/dL (ref 0.44–1.00)
Chloride: 105 mmol/L (ref 101–111)
GFR calc Af Amer: >60 mL/min (ref >60)
GLUCOSE: 135 mg/dL — AB (ref 65–99)
Potassium: 3.4 mmol/L — ABNORMAL LOW (ref 3.5–5.1)
Sodium: 141 mmol/L (ref 135–145)

## 2016-12-10 LAB — TROPONIN I: Troponin I: <0.03 ng/mL (ref <0.03)

## 2016-12-10 LAB — CBC
HCT: 45.8 % (ref 36.0–46.0)
Hemoglobin: 16.4 g/dL — ABNORMAL HIGH (ref 12.0–15.0)
MCH: 33.4 pg (ref 26.0–34.0)
MCHC: 35.8 g/dL (ref 30.0–36.0)
MCV: 93.3 fL (ref 78.0–100.0)
PLATELETS: 135 10*3/uL — AB (ref 150–400)
RBC: 4.91 MIL/uL (ref 3.87–5.11)
RDW: 11.7 % (ref 11.5–15.5)
WBC: 11.1 10*3/uL — ABNORMAL HIGH (ref 4.0–10.5)

## 2016-12-10 LAB — GLUCOSE, CAPILLARY: GLUCOSE-CAPILLARY: 96 mg/dL (ref 65–99)

## 2016-12-10 MED ORDER — CYCLOBENZAPRINE HCL 10 MG PO TABS
5.0000 mg | ORAL_TABLET | Freq: Three times a day (TID) | ORAL | Status: DC | PRN
  Administered 2016-12-10: 5 mg via ORAL
  Filled 2016-12-10: qty 1

## 2016-12-10 MED ORDER — ACETAMINOPHEN 325 MG PO TABS
650.0000 mg | ORAL_TABLET | Freq: Four times a day (QID) | ORAL | Status: DC | PRN
  Administered 2016-12-11 – 2016-12-12 (×2): 650 mg via ORAL
  Filled 2016-12-10 (×2): qty 2

## 2016-12-10 MED ORDER — FLUOXETINE HCL 20 MG PO CAPS
80.0000 mg | ORAL_CAPSULE | Freq: Every day | ORAL | Status: DC
  Administered 2016-12-11 – 2016-12-12 (×2): 80 mg via ORAL
  Filled 2016-12-10 (×2): qty 4

## 2016-12-10 MED ORDER — ONDANSETRON HCL 4 MG PO TABS: 4.0000 mg | ORAL_TABLET | Freq: Four times a day (QID) | ORAL | Status: DC | PRN

## 2016-12-10 MED ORDER — INSULIN ASPART 100 UNIT/ML ~~LOC~~ SOLN: 0.0000 [IU] | Freq: Every day | SUBCUTANEOUS | Status: DC

## 2016-12-10 MED ORDER — LACTATED RINGERS IV SOLN
INTRAVENOUS | Status: DC
  Administered 2016-12-10 – 2016-12-11 (×2): via INTRAVENOUS

## 2016-12-10 MED ORDER — ONDANSETRON HCL 4 MG/2ML IJ SOLN: 4.0000 mg | Freq: Four times a day (QID) | INTRAMUSCULAR | Status: DC | PRN

## 2016-12-10 MED ORDER — GABAPENTIN 300 MG PO CAPS
600.0000 mg | ORAL_CAPSULE | Freq: Three times a day (TID) | ORAL | Status: DC
  Administered 2016-12-10 – 2016-12-12 (×5): 600 mg via ORAL
  Filled 2016-12-10 (×5): qty 2

## 2016-12-10 MED ORDER — POTASSIUM CHLORIDE 10 MEQ/100ML IV SOLN
10.0000 meq | INTRAVENOUS | Status: AC
  Administered 2016-12-10 (×4): 10 meq via INTRAVENOUS
  Filled 2016-12-10 (×3): qty 100

## 2016-12-10 MED ORDER — ATORVASTATIN CALCIUM 40 MG PO TABS
40.0000 mg | ORAL_TABLET | Freq: Every day | ORAL | Status: DC
  Administered 2016-12-10 – 2016-12-11 (×2): 40 mg via ORAL
  Filled 2016-12-10 (×2): qty 1

## 2016-12-10 MED ORDER — ACETAMINOPHEN 650 MG RE SUPP: 650.0000 mg | Freq: Four times a day (QID) | RECTAL | Status: DC | PRN

## 2016-12-10 MED ORDER — FENTANYL 50 MCG/HR TD PT72
50.0000 ug | MEDICATED_PATCH | TRANSDERMAL | Status: DC
  Administered 2016-12-10: 50 ug via TRANSDERMAL
  Filled 2016-12-10: qty 1

## 2016-12-10 MED ORDER — ASPIRIN EC 81 MG PO TBEC
81.0000 mg | DELAYED_RELEASE_TABLET | Freq: Every day | ORAL | Status: DC
  Administered 2016-12-11 – 2016-12-12 (×2): 81 mg via ORAL
  Filled 2016-12-10 (×2): qty 1

## 2016-12-10 MED ORDER — LEVETIRACETAM 500 MG PO TABS
1000.0000 mg | ORAL_TABLET | Freq: Two times a day (BID) | ORAL | Status: DC
  Administered 2016-12-10 – 2016-12-12 (×4): 1000 mg via ORAL
  Filled 2016-12-10 (×4): qty 2

## 2016-12-10 MED ORDER — INSULIN ASPART 100 UNIT/ML ~~LOC~~ SOLN: 0.0000 [IU] | Freq: Three times a day (TID) | SUBCUTANEOUS | Status: DC

## 2016-12-10 MED ORDER — IOPAMIDOL (ISOVUE-300) INJECTION 61%
100.0000 mL | Freq: Once | INTRAVENOUS | Status: AC | PRN
  Administered 2016-12-10: 100 mL via INTRAVENOUS

## 2016-12-10 MED ORDER — TRAZODONE HCL 50 MG PO TABS
100.0000 mg | ORAL_TABLET | Freq: Every day | ORAL | Status: DC
  Administered 2016-12-11: 100 mg via ORAL
  Filled 2016-12-10: qty 2

## 2016-12-10 NOTE — ED Provider Notes (Signed)
[facility] DEPT Provider Note   CSN: [id number] Arrival date & time: 12/10/16  1306     History   Chief Complaint Chief Complaint  Patient presents with  . Chest Pain    HPI Angela Michael is a 56 y.o. female.  Patient complains of abdominal pain which started today and was followed by vomiting then chest pain.  Emesis contains food and liquid.  There is been no blood in the emesis.  She had 2 normal bowel movements yesterday.  No known sick exposures, and no prior episodes of bowel obstruction.  Prior abdominal surgery, gallbladder and gastric bypass.  She denies fever, chills, cough, shortness of breath, weakness or dizziness.  She is taking her usual medications.  There are no other known modifying factors.  HPI  Past Medical History:  Diagnosis Date  . Anemia   . CAD (coronary artery disease)    RCA 50%, LAD 30% cath 2017.   Marland Kitchen Chronic back pain   . Chronic neck pain   . Chronic pain   . Depression   . History of syncope   . Hyperlipidemia   . Lymphoma ([place])   . Neuropathy   . Peripheral edema   . PVC's (premature ventricular contractions)   . Seizures ([place])   . SVT (supraventricular tachycardia) [facility])     Patient Active Problem List   Diagnosis Date Noted  . Insomnia 11/05/2015  . Presence of vena cava filter   . Pancytopenia ([place])   . SIRS (systemic inflammatory response syndrome) ([place]) 07/31/2014  . Lymphoma ([place])   . Seizures ([place]) 12/05/2013  . Heme positive stool 06/22/2013  . Melena 06/22/2013  . Varicose veins of both lower extremities 05/23/2013  . Morbid obesity ([place]) 11/12/2012  . SVT (supraventricular tachycardia) ([place])   . Peripheral edema 08/06/2012  . Hyperlipidemia with target LDL less than 100 08/06/2012  . Neuropathy of upper extremity 08/06/2012  . Hypokalemia 08/06/2012  . STRESS ELECTROCARDIOGRAM, ABNORMAL 01/31/2009  . Depression 11/04/2008  . SYNCOPE, HX OF 11/04/2008    Past Surgical History:  Procedure Laterality Date    . CATARACT EXTRACTION Right   . CHOLECYSTECTOMY    . ENDOVENOUS ABLATION SAPHENOUS VEIN W/ LASER Right 09-08-2013   EVLA right small saphenous vein and stab phlebectomy 10-20 incisions right leg by Curt Jews MD  . GASTRIC BYPASS     Says she had lost 345 lbs  . LYMPH NODE DISSECTION Left    Resected  . lymph node removed from stomach    . MEDIAL PARTIAL KNEE REPLACEMENT  05/10/14   left knee  . MEDIAL PARTIAL KNEE REPLACEMENT Left 06/04/2015   [facility]  . SKIN SURGERY     Removal of excess skin  . VENA CAVA FILTER PLACEMENT    . VENOUS THROMBECTOMY     Blood clot resection from right arm    OB History    Gravida Para Term Preterm AB Living   2 2 2          SAB TAB Ectopic Multiple Live Births                   Home Medications    Prior to Admission medications   Medication Sig Start Date End Date Taking? Authorizing Provider  Ascorbic Acid (VITAMIN C) 1000 MG tablet Take 1,000 mg by mouth daily. Reported on 06/21/2015   Yes [provider]  aspirin 81 MG tablet Take 81 mg by mouth daily. Reported on 06/21/2015   Yes [provider]  atorvastatin (LIPITOR) 40 MG tablet Take 1 tablet (40 mg total) by mouth at bedtime. 07/22/16  Yes Martin, Mary-Margaret, FNP  cyclobenzaprine (FLEXERIL) 5 MG tablet Take 5 mg by mouth 3 (three) times daily as needed.   Yes [provider]  Fe Fum-FePoly-Vit C-Vit B3 (INTEGRA) 62.5-62.5-40-3 MG CAPS Take 1 capsule by mouth daily. 09/10/16  Yes Martin, Mary-Margaret, FNP  fentaNYL (DURAGESIC) 50 MCG/HR Place 1 patch (50 mcg total) onto the skin every 3 (three) days. 10/27/16  Yes Martin, Mary-Margaret, FNP  FLUoxetine (PROZAC) 40 MG capsule Take 2 capsules (80 mg total) by mouth daily. 07/22/16  Yes Martin, Mary-Margaret, FNP  fluticasone (FLONASE) 50 MCG/ACT nasal spray PLACE 2 SPRAYS INTO THE NOSE AS NEEDED. FOR ALLERGIES 04/19/15  Yes Hassell Done, Mary-Margaret, FNP  furosemide (LASIX) 40 MG tablet TAKE 1 TABLET (40 MG TOTAL) BY MOUTH  DAILY. 07/22/16  Yes Martin, Mary-Margaret, FNP  gabapentin (NEURONTIN) 300 MG capsule TAKE 2 CAPSULE BY MOUTH TID 07/22/16  Yes Hassell Done, Mary-Margaret, FNP  levETIRAcetam (KEPPRA) 500 MG tablet TAKE 2 TABLETS BY MOUTH 2 TIMES DAILY 07/22/16  Yes Hassell Done, Mary-Margaret, FNP  loratadine (CLARITIN) 10 MG tablet Take 10 mg by mouth as needed. For allergies   Yes [provider]  Multiple Vitamin (MULTIVITAMIN WITH MINERALS) TABS Take 1 tablet by mouth daily. Reported on 06/21/2015   Yes [provider]  nitroGLYCERIN (NITROSTAT) 0.4 MG SL tablet Place 1 tablet (0.4 mg total) under the tongue every 5 (five) minutes as needed for chest pain. 10/02/16  Yes Minus Breeding, MD  Nutritional Supplements (ESTROVEN PMS) TABS Take 1 tablet by mouth daily.    Yes [provider]  potassium chloride SA (K-DUR,KLOR-CON) 20 MEQ tablet Take 1 tablet (20 mEq total) by mouth 2 (two) times daily. 10/29/16  Yes Hassell Done, Mary-Margaret, FNP  traZODone (DESYREL) 50 MG tablet TAKE 1 TO 3 TABLETS AT BEDTIME 07/22/16  Yes Martin, Mary-Margaret, FNP  cyanocobalamin (,VITAMIN B-12,) 1000 MCG/ML injection INJECT 1 ML (1,000 MCG TOTAL) INTO THE MUSCLE EVERY 30 (THIRTY) DAYS. 09/05/16   Chevis Pretty, FNP    Family History Family History  Problem Relation Age of Onset  . Liver cancer Father   . Diabetes Father   . Kidney cancer Father   . Heart failure Brother   . Heart attack Brother   . Tuberculosis Maternal Grandmother   . Bone cancer Maternal Grandfather   . Diabetes Paternal Uncle   . Coronary artery disease Neg Hx   . Sudden death Neg Hx   . Cardiomyopathy Neg Hx   . Esophageal cancer Neg Hx   . Stomach cancer Neg Hx     Social History Social History  Substance Use Topics  . Smoking status: Never Smoker  . Smokeless tobacco: Never Used  . Alcohol use 0.0 oz/week     Comment: Occasionally     Allergies   Advicor [niacin-lovastatin er]   Review of Systems Review of Systems    All other systems reviewed and are negative.    Physical Exam Updated Vital Signs BP 121/72   Pulse (!) 50   Resp (!) 23   Ht 5\' 1"  (1.549 m)   Wt 87.5 kg (193 lb)   SpO2 92%   BMI 36.47 kg/m   Physical Exam  Constitutional: She is oriented to person, place, and time. She appears well-developed and well-nourished. She appears distressed (She is uncomfortable).  HENT:  Head: Normocephalic and atraumatic.  Eyes: Pupils are equal, round,  and reactive to light. Conjunctivae and EOM are normal.  Neck: Normal range of motion and phonation normal. Neck supple.  Cardiovascular: Normal rate and regular rhythm.   Pulmonary/Chest: Effort normal and breath sounds normal. She exhibits no tenderness.  Abdominal: Soft. She exhibits distension. She exhibits no mass. There is tenderness (Diffuse, mild). There is no rebound and no guarding. No hernia.  Musculoskeletal: Normal range of motion.  Neurological: She is alert and oriented to person, place, and time. She exhibits normal muscle tone.  Skin: Skin is warm and dry. No rash noted. No erythema.  Psychiatric: She has a normal mood and affect. Her behavior is normal. Judgment and thought content normal.  Nursing note and vitals reviewed.    ED Treatments / Results  Labs (all labs ordered are listed, but only abnormal results are displayed) Labs Reviewed  BASIC METABOLIC PANEL - Abnormal; Notable for the following:       Result Value   Potassium 3.4 (*)    Glucose, Bld 135 (*)    All other components within normal limits  CBC - Abnormal; Notable for the following:    WBC 11.1 (*)    Hemoglobin 16.4 (*)    Platelets 135 (*)    All other components within normal limits  TROPONIN I    EKG  EKG Interpretation  Date/Time:  Wednesday December 10 2016 13:19:20 EDT Ventricular Rate:  120 PR Interval:    QRS Duration: 73 QT Interval:  415 QTC Calculation: 587 R Axis:   3 Text Interpretation:  Sinus tachycardia Consider left atrial  enlargement Low voltage, precordial leads Abnormal R-wave progression, early transition Prolonged QT interval Since last tracing rate faster and QT is longer Confirmed by Daleen Bo [phone]) on 12/10/2016 1:37:24 PM       Radiology Dg Chest 2 View  Result Date: 12/10/2016 CLINICAL DATA:  Chest pain EXAM: CHEST  2 VIEW COMPARISON:  05/03/2015 chest radiograph. FINDINGS: Stable cardiomediastinal silhouette with top-normal heart size. No pneumothorax. No pleural effusion. Lungs appear clear, with no acute consolidative airspace disease and no pulmonary edema. Surgical clips overlie the right hemidiaphragm, unchanged. IMPRESSION: No active cardiopulmonary disease. Electronically Signed   By: Ilona Sorrel M.D.   On: 12/10/2016 14:11   Ct Abdomen Pelvis W Contrast  Result Date: 12/10/2016 CLINICAL DATA:  Pt complains of pain that started in the left and central chest yesterday between 4 and 5 pm in the evening. States she has been sweating as well. Pt is diaphoretic to the touch. Hx of chole., gastric bypass and lymphoma^163mL ISOVUE-300 IOPAMIDOL (ISOVUE-300) INJECTION 61% EXAM: CT ABDOMEN AND PELVIS WITH CONTRAST TECHNIQUE: Multidetector CT imaging of the abdomen and pelvis was performed using the standard protocol following bolus administration of intravenous contrast. CONTRAST:  133mL ISOVUE-300 IOPAMIDOL (ISOVUE-300) INJECTION 61% COMPARISON:  CT 08/01/2014 FINDINGS: Lower chest: Lung bases are clear. Hepatobiliary: Mild intrahepatic extrahepatic duct dilatation following cholecystectomy. Pancreas: Pancreas is normal. No ductal dilatation. No pancreatic inflammation. Spleen: Normal spleen Adrenals/urinary tract: Enlargement the LEFT adrenal gland cannot be fully characterized. No change in size from 2016. No renal lesion. Ureters and bladder normal. Stomach/Bowel: Post gastric bypass surgery with Roux-en-Y anatomy. Efferent limb of the bypass is fluid filled as well as the bypassed gastric remnant is  fluid-filled. The gastric pouch is decompressed. The proximal afferent limb is normal caliber. The more distal limb is fluid filled and an dilated to 3.2 cm. The most distal ileum ahs small amount of fecalized material and a caliber  change from fluid-filled to decompressed small-bowel over the distal 6 cm leading up the terminal ileum. (image 57, series 2) The appendix and cecum normal. Colon is predominantly collapsed. Serve diverticular the descending colon multiple diverticular sigmoid colon. Rectum normal. Vascular/Lymphatic: Abdominal aorta is normal caliber. There is no retroperitoneal or periportal lymphadenopathy. No pelvic lymphadenopathy. Reproductive: Uterus and ovaries are Other: No free-fluid.  No intraperitoneal free air. Musculoskeletal: No aggressive osseous lesion. IMPRESSION: 1. Diffusely dilated small bowel suggest small-bowel obstruction. Caliber change at the most distal small bowel without lesion identified. Dilatation extends into the efferent and afferent limb of the bypass anatomy. 2. No pneumatosis or portal venous gas. 3. Mild intra and extrahepatic biliary duct dilatation likely related to cholecystectomy. Electronically Signed   By: Suzy Bouchard M.D.   On: 12/10/2016 16:03    Procedures Procedures (including critical care time)  Medications Ordered in ED Medications  iopamidol (ISOVUE-300) 61 % injection 100 mL (100 mLs Intravenous Contrast Given 12/10/16 1537)     Initial Impression / Assessment and Plan / ED Course  I have reviewed the triage vital signs and the nursing notes.  Pertinent labs & imaging results that were available during my care of the patient were reviewed by me and considered in my medical decision making (see chart for details).      Patient Vitals for the past 24 hrs:  BP Pulse Resp SpO2 Height Weight  12/10/16 1630 121/72 (!) 50 - 92 % - -  12/10/16 1400 - (!) 116 (!) 23 100 % - -  12/10/16 1345 - (!) 128 18 100 % - -  12/10/16 1325 (!)  131/96 (!) 125 18 100 % - -  12/10/16 1322 - - - - 5\' 1"  (1.549 m) 87.5 kg (193 lb)   16: 26-case discussed with general surgery who agrees with treatment, using NG tube and he will evaluate the patient in the morning.  4:28 PM Reevaluation with update and discussion. After initial assessment and treatment, an updated evaluation reveals she agrees to stay, and had NG placed.  Findings discussed with patient and family members, all questions answered. Kathrene Sinopoli L   16: 55-case discussed with hospitalist to arrange admission      Final Clinical Impressions(s) / ED Diagnoses   Final diagnoses:  Small bowel obstruction ([place])    Small bowel obstruction, no transition point clear for cause.  She will require hospitalization overnight for observation after NG tube placement.  Nursing Notes Reviewed/ Care Coordinated Applicable Imaging Reviewed Interpretation of Laboratory Data incorporated into ED treatment   Plan: Admit  New Prescriptions New Prescriptions   No medications on file     Daleen Bo, MD 12/10/16 1700

## 2016-12-10 NOTE — ED Notes (Signed)
EKG handed to Dr. Wentz 

## 2016-12-10 NOTE — H&P (Signed)
History and Physical    Angela Michael KXF:[id number] DOB: 01-16-1961 DOA: 12/10/2016  PCP: Chevis Pretty, FNP Consultants:  Toney Rakes - GI, [facility]; Lianne Cure - plastic surgery, [facility]; [place]; [facility] - cardiology; [facility] - vascular Patient coming from: Home - lives with partner, daughter, granddaughter; Donald Prose: daughter, [phone]  Chief Complaint: abdominal pain  HPI: Angela Michael is a 56 y.o. female with medical history significant of chronic pain on opiates; seizure d/o; neuropathy; lymphoma in remission; HLD; depression; CAD; anemia; and s/p gastric bypass presenting with abdominal pain. Patient was having hot/cold flashes, sweaty, thought she was having CP, vomiting since 5pm yesterday, last ate between 4-5 yesterday and a little something this AM but it all came up.  Last BM was here in the ER x 2.  Prior BM was about 3:30 yesterday.   ED Course: SBO, no clear transition point - surgery suggests NGT, will see patient in AM.  Review of Systems: As per HPI; otherwise review of systems reviewed and negative.   Ambulatory Status:  Ambulates without assistance  Past Medical History:  Diagnosis Date  . Anemia   . CAD (coronary artery disease)    RCA 50%, LAD 30% cath 2017.   Marland Kitchen Chronic back pain   . Chronic neck pain   . Chronic pain   . Depression   . History of syncope   . Hyperlipidemia   . Lymphoma ([place])    Stage 2 - year 3 of remission  . Neuropathy   . Peripheral edema   . PVC's (premature ventricular contractions)   . Seizures ([place])   . SVT (supraventricular tachycardia) ([place])   . Varicose veins of both lower extremities     Past Surgical History:  Procedure Laterality Date  . CATARACT EXTRACTION Right   . CHOLECYSTECTOMY    . ENDOVENOUS ABLATION SAPHENOUS VEIN W/ LASER Right 09-08-2013   EVLA right small saphenous vein and stab phlebectomy 10-20 incisions right leg by Curt Jews MD  . GASTRIC BYPASS  2007   Says she had  lost 345 lbs  . LYMPH NODE DISSECTION Left    Resected  . lymph node removed from stomach    . MEDIAL PARTIAL KNEE REPLACEMENT  05/10/14   left knee  . MEDIAL PARTIAL KNEE REPLACEMENT Left 06/04/2015   [facility]  . SKIN SURGERY     Removal of excess skin  . VENA CAVA FILTER PLACEMENT    . VENOUS THROMBECTOMY     Blood clot resection from right arm    Social History   Social History  . Marital status: Married    Spouse name: N/A  . Number of children: 2  . Years of education: N/A   Occupational History  . disability    Social History Main Topics  . Smoking status: Never Smoker  . Smokeless tobacco: Never Used  . Alcohol use 0.0 oz/week     Comment: "anytime I can get it", how much "depends on how bad ya'll piss me off"  . Drug use: No  . Sexual activity: Not on file   Other Topics Concern  . Not on file   Social History Narrative   Married with 2 children    Allergies  Allergen Reactions  . Advicor [Niacin-Lovastatin Er] Rash    Family History  Problem Relation Age of Onset  . Liver cancer Father   . Diabetes Father   . Kidney cancer Father   . Heart failure Brother   . Heart attack  Brother   . Tuberculosis Maternal Grandmother   . Bone cancer Maternal Grandfather   . Diabetes Paternal Uncle   . Coronary artery disease Neg Hx   . Sudden death Neg Hx   . Cardiomyopathy Neg Hx   . Esophageal cancer Neg Hx   . Stomach cancer Neg Hx     Prior to Admission medications   Medication Sig Start Date End Date Taking? Authorizing Provider  Ascorbic Acid (VITAMIN C) 1000 MG tablet Take 1,000 mg by mouth daily. Reported on 06/21/2015   Yes [provider]  aspirin 81 MG tablet Take 81 mg by mouth daily. Reported on 06/21/2015   Yes [provider]  atorvastatin (LIPITOR) 40 MG tablet Take 1 tablet (40 mg total) by mouth at bedtime. 07/22/16  Yes Martin, Mary-Margaret, FNP  cyclobenzaprine (FLEXERIL) 5 MG tablet Take 5 mg by mouth 3 (three) times daily as  needed.   Yes [provider]  Fe Fum-FePoly-Vit C-Vit B3 (INTEGRA) 62.5-62.5-40-3 MG CAPS Take 1 capsule by mouth daily. 09/10/16  Yes Martin, Mary-Margaret, FNP  fentaNYL (DURAGESIC) 50 MCG/HR Place 1 patch (50 mcg total) onto the skin every 3 (three) days. 10/27/16  Yes Martin, Mary-Margaret, FNP  FLUoxetine (PROZAC) 40 MG capsule Take 2 capsules (80 mg total) by mouth daily. 07/22/16  Yes Martin, Mary-Margaret, FNP  fluticasone (FLONASE) 50 MCG/ACT nasal spray PLACE 2 SPRAYS INTO THE NOSE AS NEEDED. FOR ALLERGIES 04/19/15  Yes Hassell Done, Mary-Margaret, FNP  furosemide (LASIX) 40 MG tablet TAKE 1 TABLET (40 MG TOTAL) BY MOUTH DAILY. 07/22/16  Yes Martin, Mary-Margaret, FNP  gabapentin (NEURONTIN) 300 MG capsule TAKE 2 CAPSULE BY MOUTH TID 07/22/16  Yes Hassell Done, Mary-Margaret, FNP  levETIRAcetam (KEPPRA) 500 MG tablet TAKE 2 TABLETS BY MOUTH 2 TIMES DAILY 07/22/16  Yes Hassell Done, Mary-Margaret, FNP  loratadine (CLARITIN) 10 MG tablet Take 10 mg by mouth as needed. For allergies   Yes [provider]  Multiple Vitamin (MULTIVITAMIN WITH MINERALS) TABS Take 1 tablet by mouth daily. Reported on 06/21/2015   Yes [provider]  nitroGLYCERIN (NITROSTAT) 0.4 MG SL tablet Place 1 tablet (0.4 mg total) under the tongue every 5 (five) minutes as needed for chest pain. 10/02/16  Yes Minus Breeding, MD  Nutritional Supplements (ESTROVEN PMS) TABS Take 1 tablet by mouth daily.    Yes [provider]  potassium chloride SA (K-DUR,KLOR-CON) 20 MEQ tablet Take 1 tablet (20 mEq total) by mouth 2 (two) times daily. 10/29/16  Yes Hassell Done, Mary-Margaret, FNP  traZODone (DESYREL) 50 MG tablet TAKE 1 TO 3 TABLETS AT BEDTIME 07/22/16  Yes Martin, Mary-Margaret, FNP  cyanocobalamin (,VITAMIN B-12,) 1000 MCG/ML injection INJECT 1 ML (1,000 MCG TOTAL) INTO THE MUSCLE EVERY 30 (THIRTY) DAYS. 09/05/16   Chevis Pretty, FNP    Physical Exam: Vitals:   12/10/16 1325 12/10/16 1345 12/10/16 1400  12/10/16 1630  BP: (!) 131/96   121/72  Pulse: (!) 125 (!) 128 (!) 116 (!) 50  Resp: 18 18 (!) 23   SpO2: 100% 100% 100% 92%  Weight:      Height:         General:  Appears calm and comfortable (other NG tube, which she is constantly holding) and is NAD Eyes:  PERRL, EOMI, normal lids, iris ENT:  grossly normal hearing, lips & tongue, mmm; NG tube in place Neck:  no LAD, masses or thyromegaly; no carotid bruits Cardiovascular:  RRR with mild tachycardia, no m/r/g. No LE edema.  Respiratory:  CTA bilaterally with no wheezes/rales/rhonchi.  Normal respiratory effort. Abdomen:  soft, NT, ND, hypoactive BS particularly in the left abdomen Skin:  no rash or induration seen on limited exam Musculoskeletal:  grossly normal tone BUE/BLE, good ROM, no bony abnormality Lower extremity:  No LE edema.  Limited foot exam with no ulcerations.  2+ distal pulses. Psychiatric:  grossly normal mood and affect, speech fluent and appropriate, AOx3 Neurologic:  CN 2-12 grossly intact, moves all extremities in coordinated fashion, sensation intact    Radiological Exams on Admission: Dg Chest 2 View  Result Date: 12/10/2016 CLINICAL DATA:  Chest pain EXAM: CHEST  2 VIEW COMPARISON:  05/03/2015 chest radiograph. FINDINGS: Stable cardiomediastinal silhouette with top-normal heart size. No pneumothorax. No pleural effusion. Lungs appear clear, with no acute consolidative airspace disease and no pulmonary edema. Surgical clips overlie the right hemidiaphragm, unchanged. IMPRESSION: No active cardiopulmonary disease. Electronically Signed   By: Ilona Sorrel M.D.   On: 12/10/2016 14:11   Ct Abdomen Pelvis W Contrast  Result Date: 12/10/2016 CLINICAL DATA:  Pt complains of pain that started in the left and central chest yesterday between 4 and 5 pm in the evening. States she has been sweating as well. Pt is diaphoretic to the touch. Hx of chole., gastric bypass and lymphoma^134mL ISOVUE-300 IOPAMIDOL (ISOVUE-300)  INJECTION 61% EXAM: CT ABDOMEN AND PELVIS WITH CONTRAST TECHNIQUE: Multidetector CT imaging of the abdomen and pelvis was performed using the standard protocol following bolus administration of intravenous contrast. CONTRAST:  137mL ISOVUE-300 IOPAMIDOL (ISOVUE-300) INJECTION 61% COMPARISON:  CT 08/01/2014 FINDINGS: Lower chest: Lung bases are clear. Hepatobiliary: Mild intrahepatic extrahepatic duct dilatation following cholecystectomy. Pancreas: Pancreas is normal. No ductal dilatation. No pancreatic inflammation. Spleen: Normal spleen Adrenals/urinary tract: Enlargement the LEFT adrenal gland cannot be fully characterized. No change in size from 2016. No renal lesion. Ureters and bladder normal. Stomach/Bowel: Post gastric bypass surgery with Roux-en-Y anatomy. Efferent limb of the bypass is fluid filled as well as the bypassed gastric remnant is fluid-filled. The gastric pouch is decompressed. The proximal afferent limb is normal caliber. The more distal limb is fluid filled and an dilated to 3.2 cm. The most distal ileum ahs small amount of fecalized material and a caliber change from fluid-filled to decompressed small-bowel over the distal 6 cm leading up the terminal ileum. (image 57, series 2) The appendix and cecum normal. Colon is predominantly collapsed. Serve diverticular the descending colon multiple diverticular sigmoid colon. Rectum normal. Vascular/Lymphatic: Abdominal aorta is normal caliber. There is no retroperitoneal or periportal lymphadenopathy. No pelvic lymphadenopathy. Reproductive: Uterus and ovaries are Other: No free-fluid.  No intraperitoneal free air. Musculoskeletal: No aggressive osseous lesion. IMPRESSION: 1. Diffusely dilated small bowel suggest small-bowel obstruction. Caliber change at the most distal small bowel without lesion identified. Dilatation extends into the efferent and afferent limb of the bypass anatomy. 2. No pneumatosis or portal venous gas. 3. Mild intra and  extrahepatic biliary duct dilatation likely related to cholecystectomy. Electronically Signed   By: Suzy Bouchard M.D.   On: 12/10/2016 16:03    EKG: Independently reviewed.  Sinus tachycardia with rate 120; prolonged QT 587, low voltage, nonspecific ST changes likely associated with rate  Labs on Admission: I have personally reviewed the available labs and imaging studies at the time of the admission.  Pertinent labs:   K+ 3.4 Glucose 135 WBC 11.1 Hgb 16.4 Platelets 135  Assessment/Plan Principal Problem:   SBO (small bowel obstruction) (HCC) Active Problems:   Seizures ([place])  Chronic pain syndrome   Prolonged QT interval   SBO -Patient with prior h/o gastric bypass presenting with acute onset of abdominal pain with n/v, abdominal distention, and CT findings c/w SBO -Will place in observation status on Med Surg -Gen Surg consulted by ER and will see in AM; currently no indication for surgical intervention -NPO for bowel rest -NG tube in place -IVF hydration -Pain control with chronic home medications - would attempt to avoid additional narcotics if possible due to further slowing of gut motility -Already with 2 large BMs in the ER so hopefully will have quick recovery  Seizure d/o -Continue Keppra  Chronic pain syndrome -Continue Fentanyl patch -I have reviewed this patient in the [place] Controlled Substances Reporting System. She is receiving medications from only one provider and appears to be taking them as prescribed.  Prolonged QT -Attempt to avoid QT-prolonging medications whenever possible -Recheck EKG in AM  DVT prophylaxis:  SCDs Code Status:  Full - confirmed with patient/family Family Communication: Son and daughter were present throughout evaluation  Disposition Plan:  Home once clinically improved Consults called: Surgery - to see in AM  Admission status: It is my clinical opinion that referral for OBSERVATION is reasonable and necessary in this patient  based on the above information provided. The aforementioned taken together are felt to place the patient at high risk for further clinical deterioration. However it is anticipated that the patient may be medically stable for discharge from the hospital within 24 to 48 hours.    Karmen Bongo MD [facility]  If note is complete, please contact covering daytime or nighttime physician. [email or web address] Password [facility]  12/10/2016, 6:27 PM

## 2016-12-10 NOTE — ED Triage Notes (Signed)
Pt complains of pain that started in the left and central chest yesterday between 4 and 5 pm in the evening. States she has been sweating as well. Pt is diaphoretic to the touch.   NAD, but in significant pain due to Neuropathy.

## 2016-12-11 DIAGNOSIS — M542 Cervicalgia: Secondary | ICD-10-CM | POA: Diagnosis present

## 2016-12-11 DIAGNOSIS — G894 Chronic pain syndrome: Secondary | ICD-10-CM | POA: Diagnosis not present

## 2016-12-11 DIAGNOSIS — Z95828 Presence of other vascular implants and grafts: Secondary | ICD-10-CM | POA: Diagnosis not present

## 2016-12-11 DIAGNOSIS — K56600 Partial intestinal obstruction, unspecified as to cause: Secondary | ICD-10-CM | POA: Diagnosis not present

## 2016-12-11 DIAGNOSIS — I2510 Atherosclerotic heart disease of native coronary artery without angina pectoris: Secondary | ICD-10-CM | POA: Diagnosis present

## 2016-12-11 DIAGNOSIS — F329 Major depressive disorder, single episode, unspecified: Secondary | ICD-10-CM | POA: Diagnosis present

## 2016-12-11 DIAGNOSIS — R569 Unspecified convulsions: Secondary | ICD-10-CM

## 2016-12-11 DIAGNOSIS — K5669 Other partial intestinal obstruction: Secondary | ICD-10-CM | POA: Diagnosis not present

## 2016-12-11 DIAGNOSIS — E162 Hypoglycemia, unspecified: Secondary | ICD-10-CM | POA: Diagnosis not present

## 2016-12-11 DIAGNOSIS — G40909 Epilepsy, unspecified, not intractable, without status epilepticus: Secondary | ICD-10-CM | POA: Diagnosis present

## 2016-12-11 DIAGNOSIS — M549 Dorsalgia, unspecified: Secondary | ICD-10-CM | POA: Diagnosis present

## 2016-12-11 DIAGNOSIS — Z7982 Long term (current) use of aspirin: Secondary | ICD-10-CM | POA: Diagnosis not present

## 2016-12-11 DIAGNOSIS — G629 Polyneuropathy, unspecified: Secondary | ICD-10-CM | POA: Diagnosis present

## 2016-12-11 DIAGNOSIS — Z96652 Presence of left artificial knee joint: Secondary | ICD-10-CM | POA: Diagnosis present

## 2016-12-11 DIAGNOSIS — Z9884 Bariatric surgery status: Secondary | ICD-10-CM | POA: Diagnosis not present

## 2016-12-11 DIAGNOSIS — D649 Anemia, unspecified: Secondary | ICD-10-CM | POA: Diagnosis present

## 2016-12-11 DIAGNOSIS — Z8572 Personal history of non-Hodgkin lymphomas: Secondary | ICD-10-CM | POA: Diagnosis not present

## 2016-12-11 DIAGNOSIS — D696 Thrombocytopenia, unspecified: Secondary | ICD-10-CM | POA: Diagnosis not present

## 2016-12-11 DIAGNOSIS — I4581 Long QT syndrome: Secondary | ICD-10-CM | POA: Diagnosis present

## 2016-12-11 DIAGNOSIS — E785 Hyperlipidemia, unspecified: Secondary | ICD-10-CM | POA: Diagnosis present

## 2016-12-11 DIAGNOSIS — K56609 Unspecified intestinal obstruction, unspecified as to partial versus complete obstruction: Secondary | ICD-10-CM | POA: Diagnosis present

## 2016-12-11 DIAGNOSIS — Z79891 Long term (current) use of opiate analgesic: Secondary | ICD-10-CM | POA: Diagnosis not present

## 2016-12-11 LAB — GLUCOSE, CAPILLARY
GLUCOSE-CAPILLARY: 97 mg/dL (ref 65–99)
GLUCOSE-CAPILLARY: 203 mg/dL — AB (ref 65–99)
Glucose-Capillary: 134 mg/dL — ABNORMAL HIGH (ref 65–99)
Glucose-Capillary: 112 mg/dL — ABNORMAL HIGH (ref 65–99)
Glucose-Capillary: 46 mg/dL — ABNORMAL LOW (ref 65–99)

## 2016-12-11 LAB — BASIC METABOLIC PANEL
ANION GAP: 9 (ref 5–15)
BUN: 14 mg/dL (ref 6–20)
CHLORIDE: 107 mmol/L (ref 101–111)
CO2: 24 mmol/L (ref 22–32)
Calcium: 8.5 mg/dL — ABNORMAL LOW (ref 8.9–10.3)
Creatinine, Ser: 0.68 mg/dL (ref 0.44–1.00)
GFR calc non Af Amer: >60 mL/min (ref >60)
Glucose, Bld: 85 mg/dL (ref 65–99)
Potassium: 3.1 mmol/L — ABNORMAL LOW (ref 3.5–5.1)
SODIUM: 140 mmol/L (ref 135–145)

## 2016-12-11 LAB — CBC
HCT: 38.4 % (ref 36.0–46.0)
HEMOGLOBIN: 13 g/dL (ref 12.0–15.0)
MCH: 32.6 pg (ref 26.0–34.0)
MCHC: 33.9 g/dL (ref 30.0–36.0)
MCV: 96.2 fL (ref 78.0–100.0)
PLATELETS: 115 10*3/uL — AB (ref 150–400)
RBC: 3.99 MIL/uL (ref 3.87–5.11)
RDW: 12 % (ref 11.5–15.5)
WBC: 8.1 10*3/uL (ref 4.0–10.5)

## 2016-12-11 LAB — HEMOGLOBIN A1C
HEMOGLOBIN A1C: 5.1 % (ref 4.8–5.6)
Mean Plasma Glucose: 99.67 mg/dL

## 2016-12-11 NOTE — Progress Notes (Signed)
Blood sugar checked and recorded at 46, patient given orange juice with sugar added, will recheck blood sugar to make sure it is adequate.

## 2016-12-11 NOTE — Consult Note (Signed)
Reason for Consult: Small bowel obstruction Referring Physician: Dr. Sharion Dove [name] is an 56 y.o. female.  HPI: Patient is a 56 year old white female who was referred to my care for evaluation treatment of a small bowel obstruction. She presented the emergency room with an acute onset of abdominal pain and nausea. She had a CT scan of the abdomen which revealed the question bowel obstruction. She had dilated loops of bowel without any transition point. She has had multiples abdominal surgeries in the past including gastric bypass surgery in the remote past. She states she has never had an episode of a bowel obstruction. She did have 2 bowel movements while in the emergency room. She had an NG tube placed and was admitted to the hospital for further evaluation and treatment. Overnight, she has had 2 more bowel movements and has had no NG tube output. She denies any abdominal pain at the present time. She denies any diarrhea or constipation.  Past Medical History:  Diagnosis Date  . Anemia   . CAD (coronary artery disease)    RCA 50%, LAD 30% cath 2017.   Marland Kitchen Chronic back pain   . Chronic neck pain   . Chronic pain   . Depression   . History of syncope   . Hyperlipidemia   . Lymphoma ([place])    Stage 2 - year 3 of remission  . Neuropathy   . Peripheral edema   . PVC's (premature ventricular contractions)   . Seizures ([place])   . SVT (supraventricular tachycardia) ([place])   . Varicose veins of both lower extremities     Past Surgical History:  Procedure Laterality Date  . CATARACT EXTRACTION Right   . CHOLECYSTECTOMY    . ENDOVENOUS ABLATION SAPHENOUS VEIN W/ LASER Right 09-08-2013   EVLA right small saphenous vein and stab phlebectomy 10-20 incisions right leg by Curt Jews MD  . GASTRIC BYPASS  2007   Says she had lost 345 lbs  . LYMPH NODE DISSECTION Left    Resected  . lymph node removed from stomach    . MEDIAL PARTIAL KNEE REPLACEMENT  05/10/14   left knee  . MEDIAL  PARTIAL KNEE REPLACEMENT Left 06/04/2015   [facility]  . SKIN SURGERY     Removal of excess skin  . VENA CAVA FILTER PLACEMENT    . VENOUS THROMBECTOMY     Blood clot resection from right arm    Family History  Problem Relation Age of Onset  . Liver cancer Father   . Diabetes Father   . Kidney cancer Father   . Heart failure Brother   . Heart attack Brother   . Tuberculosis Maternal Grandmother   . Bone cancer Maternal Grandfather   . Diabetes Paternal Uncle   . Coronary artery disease Neg Hx   . Sudden death Neg Hx   . Cardiomyopathy Neg Hx   . Esophageal cancer Neg Hx   . Stomach cancer Neg Hx     Social History:  reports that she has never smoked. She has never used smokeless tobacco. She reports that she drinks alcohol. She reports that she does not use drugs.  Allergies:  Allergies  Allergen Reactions  . Advicor [Niacin-Lovastatin Er] Rash    Medications:  Scheduled: . aspirin EC  81 mg Oral Daily  . atorvastatin  40 mg Oral QHS  . fentaNYL  50 mcg Transdermal Q72H  . FLUoxetine  80 mg Oral Daily  . gabapentin  600 mg Oral TID  .  insulin aspart  0-15 Units Subcutaneous TID WC  . insulin aspart  0-5 Units Subcutaneous QHS  . levETIRAcetam  1,000 mg Oral BID  . traZODone  100 mg Oral QHS    Results for orders placed or performed during the hospital encounter of 12/10/16 (from the past 48 hour(s))  Basic metabolic panel     Status: Abnormal   Collection Time: 12/10/16  1:48 PM  Result Value Ref Range   Sodium 141 135 - 145 mmol/L   Potassium 3.4 (L) 3.5 - 5.1 mmol/L   Chloride 105 101 - 111 mmol/L   CO2 22 22 - 32 mmol/L   Glucose, Bld 135 (H) 65 - 99 mg/dL   BUN 16 6 - 20 mg/dL   Creatinine, Ser 0.87 0.44 - 1.00 mg/dL   Calcium 10.2 8.9 - 10.3 mg/dL   GFR calc non Af Amer >60 >60 mL/min   GFR calc Af Amer >60 >60 mL/min    Comment: (NOTE) The eGFR has been calculated using the CKD EPI equation. This calculation has not been validated in all clinical  situations. eGFR's persistently <60 mL/min signify possible Chronic Kidney Disease.    Anion gap 14 5 - 15  CBC     Status: Abnormal   Collection Time: 12/10/16  1:48 PM  Result Value Ref Range   WBC 11.1 (H) 4.0 - 10.5 K/uL   RBC 4.91 3.87 - 5.11 MIL/uL   Hemoglobin 16.4 (H) 12.0 - 15.0 g/dL   HCT 45.8 36.0 - 46.0 %   MCV 93.3 78.0 - 100.0 fL   MCH 33.4 26.0 - 34.0 pg   MCHC 35.8 30.0 - 36.0 g/dL   RDW 11.7 11.5 - 15.5 %   Platelets 135 (L) 150 - 400 K/uL  Troponin I     Status: None   Collection Time: 12/10/16  1:48 PM  Result Value Ref Range   Troponin I <0.03 <0.03 ng/mL  Hemoglobin A1c     Status: None   Collection Time: 12/10/16  1:48 PM  Result Value Ref Range   Hgb A1c MFr Bld 5.1 4.8 - 5.6 %    Comment: (NOTE) Pre diabetes:          5.7%-6.4% Diabetes:              >6.4% Glycemic control for   <7.0% adults with diabetes    Mean Plasma Glucose 99.67 mg/dL    Comment: Performed at [facility], [address], [place], [place] [address]  Glucose, capillary     Status: None   Collection Time: 12/10/16 10:09 PM  Result Value Ref Range   Glucose-Capillary 96 65 - 99 mg/dL  Basic metabolic panel     Status: Abnormal   Collection Time: 12/11/16  4:30 AM  Result Value Ref Range   Sodium 140 135 - 145 mmol/L   Potassium 3.1 (L) 3.5 - 5.1 mmol/L   Chloride 107 101 - 111 mmol/L   CO2 24 22 - 32 mmol/L   Glucose, Bld 85 65 - 99 mg/dL   BUN 14 6 - 20 mg/dL   Creatinine, Ser 0.68 0.44 - 1.00 mg/dL   Calcium 8.5 (L) 8.9 - 10.3 mg/dL   GFR calc non Af Amer >60 >60 mL/min   GFR calc Af Amer >60 >60 mL/min    Comment: (NOTE) The eGFR has been calculated using the CKD EPI equation. This calculation has not been validated in all clinical situations. eGFR's persistently <60 mL/min signify  possible Chronic Kidney Disease.    Anion gap 9 5 - 15  CBC     Status: Abnormal   Collection Time: 12/11/16  4:30 AM  Result Value Ref Range   WBC 8.1 4.0 - 10.5 K/uL   RBC  3.99 3.87 - 5.11 MIL/uL   Hemoglobin 13.0 12.0 - 15.0 g/dL    Comment: DELTA CHECK NOTED   HCT 38.4 36.0 - 46.0 %   MCV 96.2 78.0 - 100.0 fL   MCH 32.6 26.0 - 34.0 pg   MCHC 33.9 30.0 - 36.0 g/dL   RDW 12.0 11.5 - 15.5 %   Platelets 115 (L) 150 - 400 K/uL    Comment: SPECIMEN CHECKED FOR CLOTS  Glucose, capillary     Status: None   Collection Time: 12/11/16  7:44 AM  Result Value Ref Range   Glucose-Capillary 97 65 - 99 mg/dL   Comment 1 Notify RN    Comment 2 Document in Chart   Glucose, capillary     Status: Abnormal   Collection Time: 12/11/16 11:31 AM  Result Value Ref Range   Glucose-Capillary 134 (H) 65 - 99 mg/dL   Comment 1 Notify RN    Comment 2 Document in Chart     Dg Chest 2 View  Result Date: 12/10/2016 CLINICAL DATA:  Chest pain EXAM: CHEST  2 VIEW COMPARISON:  05/03/2015 chest radiograph. FINDINGS: Stable cardiomediastinal silhouette with top-normal heart size. No pneumothorax. No pleural effusion. Lungs appear clear, with no acute consolidative airspace disease and no pulmonary edema. Surgical clips overlie the right hemidiaphragm, unchanged. IMPRESSION: No active cardiopulmonary disease. Electronically Signed   By: Ilona Sorrel M.D.   On: 12/10/2016 14:11   Ct Abdomen Pelvis W Contrast  Result Date: 12/10/2016 CLINICAL DATA:  Pt complains of pain that started in the left and central chest yesterday between 4 and 5 pm in the evening. States she has been sweating as well. Pt is diaphoretic to the touch. Hx of chole., gastric bypass and lymphoma^170m ISOVUE-300 IOPAMIDOL (ISOVUE-300) INJECTION 61% EXAM: CT ABDOMEN AND PELVIS WITH CONTRAST TECHNIQUE: Multidetector CT imaging of the abdomen and pelvis was performed using the standard protocol following bolus administration of intravenous contrast. CONTRAST:  1061mISOVUE-300 IOPAMIDOL (ISOVUE-300) INJECTION 61% COMPARISON:  CT 08/01/2014 FINDINGS: Lower chest: Lung bases are clear. Hepatobiliary: Mild intrahepatic  extrahepatic duct dilatation following cholecystectomy. Pancreas: Pancreas is normal. No ductal dilatation. No pancreatic inflammation. Spleen: Normal spleen Adrenals/urinary tract: Enlargement the LEFT adrenal gland cannot be fully characterized. No change in size from 2016. No renal lesion. Ureters and bladder normal. Stomach/Bowel: Post gastric bypass surgery with Roux-en-Y anatomy. Efferent limb of the bypass is fluid filled as well as the bypassed gastric remnant is fluid-filled. The gastric pouch is decompressed. The proximal afferent limb is normal caliber. The more distal limb is fluid filled and an dilated to 3.2 cm. The most distal ileum ahs small amount of fecalized material and a caliber change from fluid-filled to decompressed small-bowel over the distal 6 cm leading up the terminal ileum. (image 57, series 2) The appendix and cecum normal. Colon is predominantly collapsed. Serve diverticular the descending colon multiple diverticular sigmoid colon. Rectum normal. Vascular/Lymphatic: Abdominal aorta is normal caliber. There is no retroperitoneal or periportal lymphadenopathy. No pelvic lymphadenopathy. Reproductive: Uterus and ovaries are Other: No free-fluid.  No intraperitoneal free air. Musculoskeletal: No aggressive osseous lesion. IMPRESSION: 1. Diffusely dilated small bowel suggest small-bowel obstruction. Caliber change at the most distal small bowel without lesion  identified. Dilatation extends into the efferent and afferent limb of the bypass anatomy. 2. No pneumatosis or portal venous gas. 3. Mild intra and extrahepatic biliary duct dilatation likely related to cholecystectomy. Electronically Signed   By: Suzy Bouchard M.D.   On: 12/10/2016 16:03    ROS:  Pertinent items are noted in HPI.  Blood pressure 118/65, pulse (!) 109, temperature 98.9 F (37.2 C), temperature source Oral, resp. rate 16, height _0  (1.549 m), weight 189 lb 9.5 oz (86 kg), SpO2 93 %. Physical Exam: Pleasant  well-developed well-nourished white female in no acute distress. Head is normocephalic, atraumatic Lungs clear auscultation with equal breath sounds bilaterally Heart examination reveals a regular rate and rhythm without S3, S4, murmurs Abdomen is soft, nontender, nondistended. No hepatosplenomegaly, masses, or hernias are appreciated. CT scan images personally reviewed  Assessment/Plan: Impression: Abdominal pain and nausea, resolving. Doubt surgical abdomen at this time. Doubt small bowel obstruction. Plan: I have already removed her NG tube. Will advance her diet as tolerated. Will follow with you.  Angela Michael 12/11/2016, 1:47 PM

## 2016-12-11 NOTE — Progress Notes (Signed)
Paged mid level MD to see if patient could advance diet from full liquids, as patient was wanting to try to eat something.  As of now, no response from mid-level MD.

## 2016-12-11 NOTE — Progress Notes (Signed)
EKG completed and placed on pts chart.  

## 2016-12-11 NOTE — Progress Notes (Signed)
Blood sugar improved to 203, after orange juice with sugar added.

## 2016-12-11 NOTE — Progress Notes (Signed)
  PROGRESS NOTE  Angela Michael ERX:[id number] DOB: 10/18/1960 DOA: 12/10/2016 PCP: Chevis Pretty, FNP  Brief Narrative: 56 year old woman presented with vomiting, abdominal pain, chest pain.  Assessment/Plan Partial small bowel obstruction. 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 -NG tube has been removed and diet advance by general surgery. Appears to be clinically resolving. Follow.  Seizure disorder -Stable. Continue Keppra  Chronic pain. -Continue fentanyl patch  Thrombocytopenia, chronic, present since 2010. -Monitor. Follow-up as an outpatient.  Prolonged QT -Resolved.   Improving. If tolerate static, likely home in/31.  DVT prophylaxis: SCDs Code Status: full Family Communication: husband at bedside    Murray Hodgkins, MD  [facility] Direct contact: [phone] --[place] [place]  --[email or web address]; password [email or web address]  7PM-7AM contact night coverage as above 12/11/2016, 2:54 PM  LOS: 0 days   Consultants:  General surgery  Procedures:    Antimicrobials:    Interval history/Subjective: Feeling better. No pain. No vomiting. Bowels moving. Diet has been advanced.  Objective: Vitals: Afebrile, 98.9, 16, 109, 118/65, 93% on room air  Exam:     Constitutional: Appears calm, comfortable.  Respiratory. Clear to auscultation bilaterally. No wheezes, rales or rhonchi. Normal respiratory effort.  Cardiovascular. Regular rate and rhythm. No murmur, rub or gallop. No lower extremity edema.  Abdomen appears soft, nontender, nondistended. Positive bowel sounds.  Psychiatric. Grossly normal mood and affect. Speech fluent and appropriate.   I have personally reviewed the following:   Labs:  Potassium 3.1, remainder basic metabolic panel unremarkable.  WBC 8.1, hemoglobin 13, platelets 115  Imaging studies:  CT abdomen and pelvis showed dilated small bowel suggestive of small bowel obstruction.  Chest  x-ray and apparently reviewed, no acute disease  Medical tests:  EKG independently reviewed, sinus tachycardia, QTC within normal limits.    Scheduled Meds: . aspirin EC  81 mg Oral Daily  . atorvastatin  40 mg Oral QHS  . fentaNYL  50 mcg Transdermal Q72H  . FLUoxetine  80 mg Oral Daily  . gabapentin  600 mg Oral TID  . insulin aspart  0-15 Units Subcutaneous TID WC  . insulin aspart  0-5 Units Subcutaneous QHS  . levETIRAcetam  1,000 mg Oral BID  . traZODone  100 mg Oral QHS   Continuous Infusions: . lactated ringers 75 mL/hr at 12/11/16 0920    Principal Problem:   SBO (small bowel obstruction) (HCC) Active Problems:   Seizures (HCC)   Chronic pain syndrome   Thrombocytopenia (HCC)   LOS: 0 days

## 2016-12-12 LAB — HIV ANTIBODY (ROUTINE TESTING W REFLEX): HIV Screen 4th Generation wRfx: NONREACTIVE

## 2016-12-12 LAB — GLUCOSE, CAPILLARY
Glucose-Capillary: 96 mg/dL (ref 65–99)
Glucose-Capillary: 100 mg/dL — ABNORMAL HIGH (ref 65–99)

## 2016-12-12 NOTE — Discharge Summary (Signed)
Physician Discharge Summary  Angela Michael [id number] DOB: March 30, 1961 DOA: 12/10/2016  PCP: Chevis Pretty, FNP  Admit date: 12/10/2016 Discharge date: 12/12/2016  Recommendations for Outpatient Follow-up:  1. Chronic thrombocytopenia of unclear significance.   Follow-up Information    Chevis Pretty, FNP Follow up.   Specialty:  Family Medicine Why:  as needed Contact information: [address] [place] [address] [phone]            Discharge Diagnoses:  1. Partial small bowel obstruction 2. Seizure disorder 3. Thrombocytopenia  Discharge Condition: Improved Disposition: Home  Diet recommendation: Resume home diet  Filed Weights   12/10/16 1322 12/10/16 2040  Weight: 87.5 kg (193 lb) 86 kg (189 lb 9.5 oz)    History of present illness:  56 year old woman presented with vomiting, abdominal pain, chest pain.  Hospital Course:  Patient was admitted, seen by general surgeon consultation, treated with supportive care with rapid improvement and resolution of bowel obstruction. On discharge patient tolerating diet, no pain or nausea, no vomiting. Bowels moving. Hospitalization was uncomplicated. Chronic comorbidities remain stable.  Consultants:  General surgery  Today's assessment: S: Feels well. No pain, nausea or vomiting. Tolerating diet. O: Vitals: Afebrile, 98.0, 16, 68, 132/72, 95% on room air   Constitutional. Appears calm, comfortable.  Respiratory. Clear to auscultation bilaterally. No wheezes, rales or rhonchi. Normal respiratory effort.  Cardiovascular. Regular rate and rhythm. No murmur, rub or gallop.  Psychiatric. Grossly normal mood and affect. Speech fluent and appropriate.  One episode of hypoglycemia noted.  Discharge Instructions  Discharge Instructions    Activity as tolerated - No restrictions    Complete by:  As directed    Discharge instructions    Complete by:  As directed    Call your  physician or seek immediate medical attention for pain, fever, vomiting, bowels not moving or worsening of condition.     Allergies as of 12/12/2016      Reactions   Advicor [niacin-lovastatin Er] Rash      Medication List    TAKE these medications   aspirin 81 MG tablet Take 81 mg by mouth daily. Reported on 06/21/2015   atorvastatin 40 MG tablet Commonly known as:  LIPITOR Take 1 tablet (40 mg total) by mouth at bedtime.   cyanocobalamin 1000 MCG/ML injection Commonly known as:  (VITAMIN B-12) INJECT 1 ML (1,000 MCG TOTAL) INTO THE MUSCLE EVERY 30 (THIRTY) DAYS.   cyclobenzaprine 5 MG tablet Commonly known as:  FLEXERIL Take 5 mg by mouth 3 (three) times daily as needed.   ESTROVEN PMS Tabs Take 1 tablet by mouth daily.   fentaNYL 50 MCG/HR Commonly known as:  DURAGESIC Place 1 patch (50 mcg total) onto the skin every 3 (three) days.   FLUoxetine 40 MG capsule Commonly known as:  PROZAC Take 2 capsules (80 mg total) by mouth daily.   fluticasone 50 MCG/ACT nasal spray Commonly known as:  FLONASE PLACE 2 SPRAYS INTO THE NOSE AS NEEDED. FOR ALLERGIES   furosemide 40 MG tablet Commonly known as:  LASIX TAKE 1 TABLET (40 MG TOTAL) BY MOUTH DAILY.   gabapentin 300 MG capsule Commonly known as:  NEURONTIN TAKE 2 CAPSULE BY MOUTH TID   INTEGRA 62.5-62.5-40-3 MG Caps Take 1 capsule by mouth daily.   levETIRAcetam 500 MG tablet Commonly known as:  KEPPRA TAKE 2 TABLETS BY MOUTH 2 TIMES DAILY   loratadine 10 MG tablet Commonly known as:  CLARITIN Take 10 mg by mouth as needed. For  allergies   multivitamin with minerals Tabs tablet Take 1 tablet by mouth daily. Reported on 06/21/2015   nitroGLYCERIN 0.4 MG SL tablet Commonly known as:  NITROSTAT Place 1 tablet (0.4 mg total) under the tongue every 5 (five) minutes as needed for chest pain.   potassium chloride SA 20 MEQ tablet Commonly known as:  K-DUR,KLOR-CON Take 1 tablet (20 mEq total) by mouth 2 (two) times  daily.   traZODone 50 MG tablet Commonly known as:  DESYREL TAKE 1 TO 3 TABLETS AT BEDTIME   vitamin C 1000 MG tablet Take 1,000 mg by mouth daily. Reported on 06/21/2015            Discharge Care Instructions        Start     Ordered   12/12/16 0000  Discharge instructions    Comments:  Call your physician or seek immediate medical attention for pain, fever, vomiting, bowels not moving or worsening of condition.   12/12/16 1033   12/12/16 0000  Activity as tolerated - No restrictions     12/12/16 1033     Allergies  Allergen Reactions  . Advicor [Niacin-Lovastatin Er] Rash    The results of significant diagnostics from this hospitalization (including imaging, microbiology, ancillary and laboratory) are listed below for reference.    Significant Diagnostic Studies: Dg Chest 2 View  Result Date: 12/10/2016 CLINICAL DATA:  Chest pain EXAM: CHEST  2 VIEW COMPARISON:  05/03/2015 chest radiograph. FINDINGS: Stable cardiomediastinal silhouette with top-normal heart size. No pneumothorax. No pleural effusion. Lungs appear clear, with no acute consolidative airspace disease and no pulmonary edema. Surgical clips overlie the right hemidiaphragm, unchanged. IMPRESSION: No active cardiopulmonary disease. Electronically Signed   By: Ilona Sorrel M.D.   On: 12/10/2016 14:11   Ct Abdomen Pelvis W Contrast  Result Date: 12/10/2016 CLINICAL DATA:  Pt complains of pain that started in the left and central chest yesterday between 4 and 5 pm in the evening. States she has been sweating as well. Pt is diaphoretic to the touch. Hx of chole., gastric bypass and lymphoma^158mL ISOVUE-300 IOPAMIDOL (ISOVUE-300) INJECTION 61% EXAM: CT ABDOMEN AND PELVIS WITH CONTRAST TECHNIQUE: Multidetector CT imaging of the abdomen and pelvis was performed using the standard protocol following bolus administration of intravenous contrast. CONTRAST:  126mL ISOVUE-300 IOPAMIDOL (ISOVUE-300) INJECTION 61% COMPARISON:  CT  08/01/2014 FINDINGS: Lower chest: Lung bases are clear. Hepatobiliary: Mild intrahepatic extrahepatic duct dilatation following cholecystectomy. Pancreas: Pancreas is normal. No ductal dilatation. No pancreatic inflammation. Spleen: Normal spleen Adrenals/urinary tract: Enlargement the LEFT adrenal gland cannot be fully characterized. No change in size from 2016. No renal lesion. Ureters and bladder normal. Stomach/Bowel: Post gastric bypass surgery with Roux-en-Y anatomy. Efferent limb of the bypass is fluid filled as well as the bypassed gastric remnant is fluid-filled. The gastric pouch is decompressed. The proximal afferent limb is normal caliber. The more distal limb is fluid filled and an dilated to 3.2 cm. The most distal ileum ahs small amount of fecalized material and a caliber change from fluid-filled to decompressed small-bowel over the distal 6 cm leading up the terminal ileum. (image 57, series 2) The appendix and cecum normal. Colon is predominantly collapsed. Serve diverticular the descending colon multiple diverticular sigmoid colon. Rectum normal. Vascular/Lymphatic: Abdominal aorta is normal caliber. There is no retroperitoneal or periportal lymphadenopathy. No pelvic lymphadenopathy. Reproductive: Uterus and ovaries are Other: No free-fluid.  No intraperitoneal free air. Musculoskeletal: No aggressive osseous lesion. IMPRESSION: 1. Diffusely dilated small bowel suggest  small-bowel obstruction. Caliber change at the most distal small bowel without lesion identified. Dilatation extends into the efferent and afferent limb of the bypass anatomy. 2. No pneumatosis or portal venous gas. 3. Mild intra and extrahepatic biliary duct dilatation likely related to cholecystectomy. Electronically Signed   By: Suzy Bouchard M.D.   On: 12/10/2016 16:03   Labs: Basic Metabolic Panel:  Recent Labs Lab 12/10/16 1348 12/11/16 0430  NA 141 140  K 3.4* 3.1*  CL 105 107  CO2 22 24  GLUCOSE 135* 85  BUN  16 14  CREATININE 0.87 0.68  CALCIUM 10.2 8.5*   CBC:  Recent Labs Lab 12/10/16 1348 12/11/16 0430  WBC 11.1* 8.1  HGB 16.4* 13.0  HCT 45.8 38.4  MCV 93.3 96.2  PLT 135* 115*   Cardiac Enzymes:  Recent Labs Lab 12/10/16 1348  TROPONINI <0.03    CBG:  Recent Labs Lab 12/11/16 1717 12/11/16 2142 12/11/16 2238 12/12/16 0730 12/12/16 1129  GLUCAP 112* 46* 203* 96 100*    Principal Problem:   SBO (small bowel obstruction) (HCC) Active Problems:   Seizures ([place])   Chronic pain syndrome   Thrombocytopenia ([place])   Time coordinating discharge: 20 MINUTES  Signed:  Murray Hodgkins, MD [facility] 12/12/2016, 4:23 PM

## 2016-12-12 NOTE — Progress Notes (Signed)
Inpatient Diabetes Program Recommendations  AACE/ADA: New Consensus Statement on Inpatient Glycemic Control (2015)  Target Ranges:  Prepandial:   less than 140 mg/dL      Peak postprandial:   less than 180 mg/dL (1-2 hours)      Critically ill patients:  140 - 180 mg/dL   Results for MATISON, NUCCIO (MRN [id number]) as of 12/12/2016 09:00  Ref. Range 12/11/2016 07:44 12/11/2016 11:31 12/11/2016 17:17 12/11/2016 21:42 12/11/2016 22:38  Glucose-Capillary Latest Ref Range: 65 - 99 mg/dL 97 134 (H) 112 (H) 46 (L) 203 (H)    Admit with: SBO  Current Insulin Orders: Novolog Moderate Correction Scale/ SSI (0-15 units) TID AC + HS       MD- Do not see that patient has a History of DM.  Hemoglobin A1c level is 5.1%.  Patient had Hypoglycemic event last night at bedtime (CBG 46 mg/dl).  Please consider discontinuing Novolog SSI for now.      --Will follow patient during hospitalization--  Wyn Quaker RN, MSN, CDE Diabetes Coordinator Inpatient Glycemic Control Team Team Pager: [phone] (8a-5p)

## 2016-12-12 NOTE — Care Management Note (Signed)
Case Management Note  Patient Details  Name: Angela Michael MRN: [id number] Date of Birth: August 05, 1960  Admitted with SBO. Chart reviewed for CM needs. Pt from home, ind with ADL's. Has insurance with drug coverage, pcp and transportation to appointments. Pt has no HH or DME needs. No CM needs noted at DC.   Expected Discharge Date:  12/12/16               Expected Discharge Plan:  Home/Self Care  In-House Referral:  NA  Discharge planning Services  CM Consult  Post Acute Care Choice:  NA Choice offered to:  NA  Status of Service:  Completed, signed off  Sherald Barge, RN 12/12/2016, 10:44 AM

## 2016-12-12 NOTE — Progress Notes (Signed)
Angela Michael discharged Home per MD order.  Discharge instructions reviewed and discussed with the patient, all questions and concerns answered. Copy of instructions given to patient.  Allergies as of 12/12/2016      Reactions   Advicor [niacin-lovastatin Er] Rash      Medication List    TAKE these medications   aspirin 81 MG tablet Take 81 mg by mouth daily. Reported on 06/21/2015   atorvastatin 40 MG tablet Commonly known as:  LIPITOR Take 1 tablet (40 mg total) by mouth at bedtime.   cyanocobalamin 1000 MCG/ML injection Commonly known as:  (VITAMIN B-12) INJECT 1 ML (1,000 MCG TOTAL) INTO THE MUSCLE EVERY 30 (THIRTY) DAYS.   cyclobenzaprine 5 MG tablet Commonly known as:  FLEXERIL Take 5 mg by mouth 3 (three) times daily as needed.   ESTROVEN PMS Tabs Take 1 tablet by mouth daily.   fentaNYL 50 MCG/HR Commonly known as:  DURAGESIC Place 1 patch (50 mcg total) onto the skin every 3 (three) days.   FLUoxetine 40 MG capsule Commonly known as:  PROZAC Take 2 capsules (80 mg total) by mouth daily.   fluticasone 50 MCG/ACT nasal spray Commonly known as:  FLONASE PLACE 2 SPRAYS INTO THE NOSE AS NEEDED. FOR ALLERGIES   furosemide 40 MG tablet Commonly known as:  LASIX TAKE 1 TABLET (40 MG TOTAL) BY MOUTH DAILY.   gabapentin 300 MG capsule Commonly known as:  NEURONTIN TAKE 2 CAPSULE BY MOUTH TID   INTEGRA 62.5-62.5-40-3 MG Caps Take 1 capsule by mouth daily.   levETIRAcetam 500 MG tablet Commonly known as:  KEPPRA TAKE 2 TABLETS BY MOUTH 2 TIMES DAILY   loratadine 10 MG tablet Commonly known as:  CLARITIN Take 10 mg by mouth as needed. For allergies   multivitamin with minerals Tabs tablet Take 1 tablet by mouth daily. Reported on 06/21/2015   nitroGLYCERIN 0.4 MG SL tablet Commonly known as:  NITROSTAT Place 1 tablet (0.4 mg total) under the tongue every 5 (five) minutes as needed for chest pain.   potassium chloride SA 20 MEQ tablet Commonly known as:   K-DUR,KLOR-CON Take 1 tablet (20 mEq total) by mouth 2 (two) times daily.   traZODone 50 MG tablet Commonly known as:  DESYREL TAKE 1 TO 3 TABLETS AT BEDTIME   vitamin C 1000 MG tablet Take 1,000 mg by mouth daily. Reported on 06/21/2015            Discharge Care Instructions        Start     Ordered   12/12/16 0000  Discharge instructions    Comments:  Call your physician or seek immediate medical attention for pain, fever, vomiting, bowels not moving or worsening of condition.   12/12/16 1033   12/12/16 0000  Activity as tolerated - No restrictions     12/12/16 1033      Patients skin is clean, dry and intact, no evidence of skin break down. IV site discontinued and catheter remains intact. Site without signs and symptoms of complications. Dressing and pressure applied.  Patient escorted to car by NT in a wheelchair,  no distress noted upon discharge.  Angela Michael Angela Michael 12/12/2016 1:59 PM

## 2016-12-12 NOTE — Progress Notes (Signed)
Subjective: Patient has no abdominal pain. Tolerating liquid diet well. No nausea or vomiting is noted.  Objective: Vital signs in last 24 hours: Temp:  [98 F (36.7 C)-98.2 F (36.8 C)] 98 F (36.7 C) (08/31 0611) Pulse Rate:  [57-68] 68 (08/31 0611) Resp:  [16] 16 (08/31 0611) BP: (114-132)/(66-72) 132/72 (08/31 0611) SpO2:  [94 %-95 %] 95 % (08/31 0611) Last BM Date: 12/11/16  Intake/Output from previous day: 08/30 0701 - 08/31 0700 In: 1457.9 [I.V.:1457.9] Out: -  Intake/Output this shift: No intake/output data recorded.  General appearance: alert, cooperative and no distress GI: soft, non-tender; bowel sounds normal; no masses,  no organomegaly  Lab Results:   Recent Labs  12/10/16 1348 12/11/16 0430  WBC 11.1* 8.1  HGB 16.4* 13.0  HCT 45.8 38.4  PLT 135* 115*   BMET  Recent Labs  12/10/16 1348 12/11/16 0430  NA 141 140  K 3.4* 3.1*  CL 105 107  CO2 22 24  GLUCOSE 135* 85  BUN 16 14  CREATININE 0.87 0.68  CALCIUM 10.2 8.5*   PT/INR No results for input(s): LABPROT, INR in the last 72 hours.  Studies/Results: Dg Chest 2 View  Result Date: 12/10/2016 CLINICAL DATA:  Chest pain EXAM: CHEST  2 VIEW COMPARISON:  05/03/2015 chest radiograph. FINDINGS: Stable cardiomediastinal silhouette with top-normal heart size. No pneumothorax. No pleural effusion. Lungs appear clear, with no acute consolidative airspace disease and no pulmonary edema. Surgical clips overlie the right hemidiaphragm, unchanged. IMPRESSION: No active cardiopulmonary disease. Electronically Signed   By: Ilona Sorrel M.D.   On: 12/10/2016 14:11   Ct Abdomen Pelvis W Contrast  Result Date: 12/10/2016 CLINICAL DATA:  Pt complains of pain that started in the left and central chest yesterday between 4 and 5 pm in the evening. States she has been sweating as well. Pt is diaphoretic to the touch. Hx of chole., gastric bypass and lymphoma^119mL ISOVUE-300 IOPAMIDOL (ISOVUE-300) INJECTION 61%  EXAM: CT ABDOMEN AND PELVIS WITH CONTRAST TECHNIQUE: Multidetector CT imaging of the abdomen and pelvis was performed using the standard protocol following bolus administration of intravenous contrast. CONTRAST:  164mL ISOVUE-300 IOPAMIDOL (ISOVUE-300) INJECTION 61% COMPARISON:  CT 08/01/2014 FINDINGS: Lower chest: Lung bases are clear. Hepatobiliary: Mild intrahepatic extrahepatic duct dilatation following cholecystectomy. Pancreas: Pancreas is normal. No ductal dilatation. No pancreatic inflammation. Spleen: Normal spleen Adrenals/urinary tract: Enlargement the LEFT adrenal gland cannot be fully characterized. No change in size from 2016. No renal lesion. Ureters and bladder normal. Stomach/Bowel: Post gastric bypass surgery with Roux-en-Y anatomy. Efferent limb of the bypass is fluid filled as well as the bypassed gastric remnant is fluid-filled. The gastric pouch is decompressed. The proximal afferent limb is normal caliber. The more distal limb is fluid filled and an dilated to 3.2 cm. The most distal ileum ahs small amount of fecalized material and a caliber change from fluid-filled to decompressed small-bowel over the distal 6 cm leading up the terminal ileum. (image 57, series 2) The appendix and cecum normal. Colon is predominantly collapsed. Serve diverticular the descending colon multiple diverticular sigmoid colon. Rectum normal. Vascular/Lymphatic: Abdominal aorta is normal caliber. There is no retroperitoneal or periportal lymphadenopathy. No pelvic lymphadenopathy. Reproductive: Uterus and ovaries are Other: No free-fluid.  No intraperitoneal free air. Musculoskeletal: No aggressive osseous lesion. IMPRESSION: 1. Diffusely dilated small bowel suggest small-bowel obstruction. Caliber change at the most distal small bowel without lesion identified. Dilatation extends into the efferent and afferent limb of the bypass anatomy. 2. No pneumatosis or  portal venous gas. 3. Mild intra and extrahepatic biliary  duct dilatation likely related to cholecystectomy. Electronically Signed   By: Suzy Bouchard M.D.   On: 12/10/2016 16:03    Anti-infectives: Anti-infectives    None      Assessment/Plan: Impression: Bowel dysfunction resolved. Doubt small bowel obstruction. Diet has been advanced. Okay for discharge from surgery standpoint. Will sign off.  LOS: 1 day    Aviva Signs 12/12/2016

## 2016-12-22 ENCOUNTER — Encounter: Payer: Self-pay | Admitting: Vascular Surgery

## 2016-12-23 ENCOUNTER — Encounter: Payer: Self-pay | Admitting: Vascular Surgery

## 2016-12-23 ENCOUNTER — Ambulatory Visit (HOSPITAL_COMMUNITY)
Admission: RE | Admit: 2016-12-23 | Discharge: 2016-12-23 | Disposition: A | Discharge status: Home / Self Care | Payer: Medicaid Other | Source: Ambulatory Visit | Attending: Vascular Surgery | Admitting: Vascular Surgery

## 2016-12-23 ENCOUNTER — Other Ambulatory Visit: Payer: Self-pay | Admitting: Nurse Practitioner

## 2016-12-23 ENCOUNTER — Other Ambulatory Visit: Payer: Self-pay | Admitting: *Deleted

## 2016-12-23 ENCOUNTER — Ambulatory Visit (INDEPENDENT_AMBULATORY_CARE_PROVIDER_SITE_OTHER): Payer: Medicaid Other | Admitting: Vascular Surgery

## 2016-12-23 VITALS — BP 97/67 | HR 106 | Temp 97.4°F | Resp 16 | Ht 61.75 in | Wt 188.0 lb

## 2016-12-23 DIAGNOSIS — I83812 Varicose veins of left lower extremities with pain: Secondary | ICD-10-CM

## 2016-12-23 DIAGNOSIS — I83899 Varicose veins of unspecified lower extremities with other complications: Secondary | ICD-10-CM | POA: Diagnosis not present

## 2016-12-23 DIAGNOSIS — R609 Edema, unspecified: Secondary | ICD-10-CM | POA: Diagnosis present

## 2016-12-23 DIAGNOSIS — I83813 Varicose veins of bilateral lower extremities with pain: Secondary | ICD-10-CM | POA: Diagnosis not present

## 2016-12-23 NOTE — Progress Notes (Signed)
[facility] [place]  Patient name: Angela Michael MRN: [id number] DOB: Jun 10, 1960 Sex: female  REASON FOR VISIT: Here today for discussion of pain and swelling in her left leg. She is well-known to me from right small saphenous vein ablation 3 years ago. She had similar discomfort prior to this but reports complete resolution since her right leg ablation. She reports that after prolonged standing she has severe pain at her calf and ankle over the varicosities in her calf and ankle. Fortunately she has no history of DVT.  HPI: Angela Michael is a 56 y.o. female she has a history of significant weight loss down from over 500 pounds. The pain is not relieved by elevation.  Past Medical History:  Diagnosis Date  . Anemia   . CAD (coronary artery disease)    RCA 50%, LAD 30% cath 2017.   Marland Kitchen Chronic back pain   . Chronic neck pain   . Chronic pain   . Depression   . History of syncope   . Hyperlipidemia   . Lymphoma ([place])    Stage 2 - year 3 of remission  . Neuropathy   . Peripheral edema   . PVC's (premature ventricular contractions)   . Seizures ([place])   . SVT (supraventricular tachycardia) ([place])   . Varicose veins of both lower extremities     Family History  Problem Relation Age of Onset  . Liver cancer Father   . Diabetes Father   . Kidney cancer Father   . Heart failure Brother   . Heart attack Brother   . Tuberculosis Maternal Grandmother   . Bone cancer Maternal Grandfather   . Diabetes Paternal Uncle   . Coronary artery disease Neg Hx   . Sudden death Neg Hx   . Cardiomyopathy Neg Hx   . Esophageal cancer Neg Hx   . Stomach cancer Neg Hx     SOCIAL HISTORY: Social History  Substance Use Topics  . Smoking status: Never Smoker  . Smokeless tobacco: Never Used  . Alcohol use 0.0 oz/week     Comment: "anytime I can get it", how much "depends on how bad ya'll piss me off"    Allergies  Allergen  Reactions  . Advicor [Niacin-Lovastatin Er] Rash    Current Outpatient Prescriptions  Medication Sig Dispense Refill  . Ascorbic Acid (VITAMIN C) 1000 MG tablet Take 1,000 mg by mouth daily. Reported on 06/21/2015    . aspirin 81 MG tablet Take 81 mg by mouth daily. Reported on 06/21/2015    . atorvastatin (LIPITOR) 40 MG tablet Take 1 tablet (40 mg total) by mouth at bedtime. 30 tablet 5  . cyanocobalamin (,VITAMIN B-12,) 1000 MCG/ML injection INJECT 1 ML (1,000 MCG TOTAL) INTO THE MUSCLE EVERY 30 (THIRTY) DAYS. 30 mL 0  . cyclobenzaprine (FLEXERIL) 5 MG tablet Take 5 mg by mouth 3 (three) times daily as needed.    . Fe Fum-FePoly-Vit C-Vit B3 (INTEGRA) 62.5-62.5-40-3 MG CAPS Take 1 capsule by mouth daily. 90 capsule 1  . fentaNYL (DURAGESIC) 50 MCG/HR Place 1 patch (50 mcg total) onto the skin every 3 (three) days. 10 patch 0  . FLUoxetine (PROZAC) 40 MG capsule Take 2 capsules (80 mg total) by mouth daily. 60 capsule 5  . fluticasone (FLONASE) 50 MCG/ACT nasal spray PLACE 2 SPRAYS INTO THE NOSE AS NEEDED. FOR ALLERGIES 16 g 3  . furosemide (LASIX) 40 MG tablet TAKE 1 TABLET (40 MG TOTAL) BY MOUTH DAILY. [place]  tablet 5  . gabapentin (NEURONTIN) 300 MG capsule TAKE 2 CAPSULE BY MOUTH TID 180 capsule 5  . levETIRAcetam (KEPPRA) 500 MG tablet TAKE 2 TABLETS BY MOUTH 2 TIMES DAILY 120 tablet 5  . loratadine (CLARITIN) 10 MG tablet Take 10 mg by mouth as needed. For allergies    . Multiple Vitamin (MULTIVITAMIN WITH MINERALS) TABS Take 1 tablet by mouth daily. Reported on 06/21/2015    . nitroGLYCERIN (NITROSTAT) 0.4 MG SL tablet Place 1 tablet (0.4 mg total) under the tongue every 5 (five) minutes as needed for chest pain. 25 tablet 3  . Nutritional Supplements (ESTROVEN PMS) TABS Take 1 tablet by mouth daily.     . potassium chloride SA (K-DUR,KLOR-CON) 20 MEQ tablet Take 1 tablet (20 mEq total) by mouth 2 (two) times daily. 30 tablet 3  . traZODone (DESYREL) 50 MG tablet TAKE 1 TO 3 TABLETS AT BEDTIME  270 tablet 1   No current facility-administered medications for this visit.     REVIEW OF SYSTEMS:  [X]  denotes positive finding, [ ]  denotes negative finding Cardiac  Comments:  Chest pain or chest pressure:    Shortness of breath upon exertion:    Short of breath when lying flat:    Irregular heart rhythm:        Vascular    Pain in calf, thigh, or hip brought on by ambulation:    Pain in feet at night that wakes you up from your sleep:     Blood clot in your veins:    Leg swelling:  x         PHYSICAL EXAM: Vitals:   12/23/16 1424  BP: 97/67  Pulse: (!) 106  Resp: 16  Temp: (!) 97.4 F (36.3 C)  SpO2: 93%  Weight: 188 lb (85.3 kg)  Height: 5' 1.75" (1.568 m)    GENERAL: The patient is a well-nourished female, in no acute distress. The vital signs are documented above. CARDIOVASCULAR: Dorsalis pedis pulses bilaterally PULMONARY: There is good air exchange  MUSCULOSKELETAL: There are no major deformities or cyanosis. NEUROLOGIC: No focal weakness or paresthesias are detected. SKIN: There are no ulcers or rashes noted. PSYCHIATRIC: The patient has a normal affect.  DATA:  Duplex today shows some reflux with no significant enlargement in her left great saphenous vein. She does have marked enlargement in her left small saphenous vein with the large tributary branches arising from this.  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 Have recommended a trial of elevation and compression. She did try this on her right leg with no success. She would be an excellent candidate for ablation of her left small saphenous vein. There was some reflux in her proximal left great saphenous vein below the for any treatment of this after treatment of the small saphenous vein to determine if she gets adequate symptomatic relief. She will begin wearing her graduated compression garments we will see her in 3 months for continued discussion    Rosetta Posner,  MD FACS [facility] Tel [phone] Pager [phone]

## 2016-12-24 ENCOUNTER — Encounter: Payer: Self-pay | Admitting: Family Medicine

## 2016-12-24 ENCOUNTER — Ambulatory Visit (INDEPENDENT_AMBULATORY_CARE_PROVIDER_SITE_OTHER): Payer: Medicaid Other | Admitting: Family Medicine

## 2016-12-24 VITALS — BP 102/52 | HR 61 | Temp 97.0°F | Ht 61.75 in | Wt 188.0 lb

## 2016-12-24 DIAGNOSIS — K5900 Constipation, unspecified: Secondary | ICD-10-CM

## 2016-12-24 DIAGNOSIS — K5903 Drug induced constipation: Secondary | ICD-10-CM

## 2016-12-24 DIAGNOSIS — R140 Abdominal distension (gaseous): Secondary | ICD-10-CM

## 2016-12-24 DIAGNOSIS — R109 Unspecified abdominal pain: Secondary | ICD-10-CM | POA: Diagnosis not present

## 2016-12-24 DIAGNOSIS — K56609 Unspecified intestinal obstruction, unspecified as to partial versus complete obstruction: Secondary | ICD-10-CM

## 2016-12-24 MED ORDER — DOCUSATE SODIUM 100 MG PO CAPS: 100.0000 mg | ORAL_CAPSULE | Freq: Two times a day (BID) | ORAL | 3 refills | Status: DC

## 2016-12-24 MED ORDER — POLYETHYLENE GLYCOL 3350 17 GM/SCOOP PO POWD: 17.0000 g | Freq: Every day | ORAL | 1 refills | Status: AC | PRN

## 2016-12-24 NOTE — Progress Notes (Signed)
BP (!) 102/52   Pulse 61   Temp (!) 97 F (36.1 C) (Oral)   Ht 5' 1.75" (1.568 m)   Wt 188 lb (85.3 kg)   BMI 34.66 kg/m    Subjective:    Patient ID: Angela Michael, female    DOB: May 13, 1960, 56 y.o.   MRN: [id number]  HPI: Angela Michael is a 56 y.o. female presenting on 12/24/2016 for Hospitalization Follow-up (8/29 AP- small bowel obstruction. C/o on and off abdominal pain. States she can only eat a small amout of food and then she will start having abd pain.)   HPI Small bowel obstruction/delayed gastric emptying Patient is coming in with complaints of continued abdominal pain after small bowel obstruction and hospitalization. She was in the hospital on 829 through 831. She says she still has that feeling of fullness and tightness in her abdomen but denies any fevers or chills or blood in her stool or nausea or vomiting. She still has been having some difficulty having regular bowel movements and has not had one every other day but more like every other day and they have been smaller again. She has a history of gastric bypass and would like to go back to see that surgeon see if there is anything she is doing wrong along those lines.  Relevant past medical, surgical, family and social history reviewed and updated as indicated. Interim medical history since our last visit reviewed. Allergies and medications reviewed and updated.  Review of Systems  Constitutional: Negative for chills and fever.  HENT: Negative for congestion, ear discharge and ear pain.   Eyes: Negative for redness and visual disturbance.  Respiratory: Negative for chest tightness and shortness of breath.   Cardiovascular: Negative for chest pain and leg swelling.  Gastrointestinal: Positive for abdominal distention, abdominal pain and constipation. Negative for blood in stool, diarrhea, nausea and vomiting.  Genitourinary: Negative for difficulty urinating and dysuria.  Musculoskeletal: Negative for back pain  and gait problem.  Skin: Negative for rash.  Neurological: Negative for light-headedness and headaches.  Psychiatric/Behavioral: Negative for agitation and behavioral problems.  All other systems reviewed and are negative.   Per HPI unless specifically indicated above        Objective:    BP (!) 102/52   Pulse 61   Temp (!) 97 F (36.1 C) (Oral)   Ht 5' 1.75" (1.568 m)   Wt 188 lb (85.3 kg)   BMI 34.66 kg/m   Wt Readings from Last 3 Encounters:  12/24/16 188 lb (85.3 kg)  12/23/16 188 lb (85.3 kg)  12/10/16 189 lb 9.5 oz (86 kg)    Physical Exam  Constitutional: She is oriented to person, place, and time. She appears well-developed and well-nourished. No distress.  Eyes: Conjunctivae are normal.  Cardiovascular: Normal rate, regular rhythm, normal heart sounds and intact distal pulses.   No murmur heard. Pulmonary/Chest: Effort normal and breath sounds normal. No respiratory distress. She has no wheezes. She has no rales.  Abdominal: Soft. Bowel sounds are normal. She exhibits no distension. There is tenderness (Mild vague abdominal tenderness and pressure). There is no rebound and no guarding.  Musculoskeletal: Normal range of motion. She exhibits no edema or tenderness.  Neurological: She is alert and oriented to person, place, and time. Coordination normal.  Skin: Skin is warm and dry. No rash noted. She is not diaphoretic.  Psychiatric: She has a normal mood and affect. Her behavior is normal.  Nursing note and vitals reviewed.  Assessment & Plan:   Problem List Items Addressed This Visit      Digestive   SBO (small bowel obstruction) (HCC) - Primary   Relevant Medications   polyethylene glycol powder (GLYCOLAX/MIRALAX) powder   docusate sodium (COLACE) 100 MG capsule   Other Relevant Orders   Ambulatory referral to General Surgery    Other Visit Diagnoses    Drug-induced constipation       Relevant Medications   polyethylene glycol powder  (GLYCOLAX/MIRALAX) powder   docusate sodium (COLACE) 100 MG capsule       Follow up plan: Return if symptoms worsen or fail to improve.  Counseling provided for all of the vaccine components Orders Placed This Encounter  Procedures  . Ambulatory referral to Mona Antowan [place], MD [facility] 12/24/2016, 1:27 PM

## 2017-01-20 ENCOUNTER — Other Ambulatory Visit: Payer: Self-pay | Admitting: Nurse Practitioner

## 2017-02-06 ENCOUNTER — Ambulatory Visit (INDEPENDENT_AMBULATORY_CARE_PROVIDER_SITE_OTHER): Payer: Medicaid Other | Admitting: Nurse Practitioner

## 2017-02-06 ENCOUNTER — Encounter: Payer: Self-pay | Admitting: Nurse Practitioner

## 2017-02-06 VITALS — BP 102/72 | HR 64 | Temp 96.8°F | Ht 61.0 in | Wt 182.0 lb

## 2017-02-06 DIAGNOSIS — R569 Unspecified convulsions: Secondary | ICD-10-CM | POA: Diagnosis not present

## 2017-02-06 DIAGNOSIS — F3342 Major depressive disorder, recurrent, in full remission: Secondary | ICD-10-CM

## 2017-02-06 DIAGNOSIS — I471 Supraventricular tachycardia, unspecified: Secondary | ICD-10-CM

## 2017-02-06 DIAGNOSIS — R600 Localized edema: Secondary | ICD-10-CM

## 2017-02-06 DIAGNOSIS — F5101 Primary insomnia: Secondary | ICD-10-CM | POA: Diagnosis not present

## 2017-02-06 DIAGNOSIS — E876 Hypokalemia: Secondary | ICD-10-CM | POA: Diagnosis not present

## 2017-02-06 DIAGNOSIS — Z95828 Presence of other vascular implants and grafts: Secondary | ICD-10-CM | POA: Diagnosis not present

## 2017-02-06 DIAGNOSIS — C8590 Non-Hodgkin lymphoma, unspecified, unspecified site: Secondary | ICD-10-CM | POA: Diagnosis not present

## 2017-02-06 DIAGNOSIS — R609 Edema, unspecified: Secondary | ICD-10-CM | POA: Insufficient documentation

## 2017-02-06 DIAGNOSIS — E785 Hyperlipidemia, unspecified: Secondary | ICD-10-CM | POA: Diagnosis not present

## 2017-02-06 DIAGNOSIS — G5691 Unspecified mononeuropathy of right upper limb: Secondary | ICD-10-CM

## 2017-02-06 DIAGNOSIS — R6510 Systemic inflammatory response syndrome (SIRS) of non-infectious origin without acute organ dysfunction: Secondary | ICD-10-CM

## 2017-02-06 MED ORDER — FUROSEMIDE 40 MG PO TABS: ORAL_TABLET | ORAL | 5 refills | Status: DC

## 2017-02-06 MED ORDER — ATORVASTATIN CALCIUM 40 MG PO TABS: 40.0000 mg | ORAL_TABLET | Freq: Every day | ORAL | 5 refills | Status: DC

## 2017-02-06 MED ORDER — TRAZODONE HCL 50 MG PO TABS: ORAL_TABLET | ORAL | 1 refills | Status: DC

## 2017-02-06 MED ORDER — POTASSIUM CHLORIDE CRYS ER 20 MEQ PO TBCR: 20.0000 meq | EXTENDED_RELEASE_TABLET | Freq: Two times a day (BID) | ORAL | 5 refills | Status: DC

## 2017-02-06 MED ORDER — GABAPENTIN 300 MG PO CAPS: ORAL_CAPSULE | ORAL | 5 refills | Status: DC

## 2017-02-06 MED ORDER — FLUOXETINE HCL 40 MG PO CAPS: 80.0000 mg | ORAL_CAPSULE | Freq: Every day | ORAL | 5 refills | Status: DC

## 2017-02-06 MED ORDER — FENTANYL 50 MCG/HR TD PT72: 50.0000 ug | MEDICATED_PATCH | TRANSDERMAL | 0 refills | Status: DC

## 2017-02-06 MED ORDER — LEVETIRACETAM 500 MG PO TABS: ORAL_TABLET | ORAL | 5 refills | Status: DC

## 2017-02-06 NOTE — Progress Notes (Signed)
Subjective:    Patient ID: Angela Michael, female    DOB: 1961-01-28, 56 y.o.   MRN: [id number]  HPI  Angela Michael is here today for follow up of chronic medical problem.  Outpatient Encounter Prescriptions as of 02/06/2017  Medication Sig  . Ascorbic Acid (VITAMIN C) 1000 MG tablet Take 1,000 mg by mouth daily. Reported on 06/21/2015  . aspirin 81 MG tablet Take 81 mg by mouth daily. Reported on 06/21/2015  . atorvastatin (LIPITOR) 40 MG tablet Take 1 tablet (40 mg total) by mouth at bedtime.  . cyanocobalamin (,VITAMIN B-12,) 1000 MCG/ML injection INJECT 1 ML (1,000 MCG TOTAL) INTO THE MUSCLE EVERY 30 (THIRTY) DAYS.  Marland Kitchen cyclobenzaprine (FLEXERIL) 5 MG tablet TAKE 1 TABLET BY MOUTH THREE TIMES A DAY AS NEEDED FOR MUSCLE SPASMS  . docusate sodium (COLACE) 100 MG capsule Take 1 capsule (100 mg total) by mouth 2 (two) times daily.  . Fe Fum-FePoly-Vit C-Vit B3 (INTEGRA) 62.5-62.5-40-3 MG CAPS Take 1 capsule by mouth daily.  . fentaNYL (DURAGESIC) 50 MCG/HR Place 1 patch (50 mcg total) onto the skin every 3 (three) days.  Marland Kitchen FLUoxetine (PROZAC) 40 MG capsule Take 2 capsules (80 mg total) by mouth daily.  . fluticasone (FLONASE) 50 MCG/ACT nasal spray PLACE 2 SPRAYS INTO THE NOSE AS NEEDED. FOR ALLERGIES  . furosemide (LASIX) 40 MG tablet TAKE 1 TABLET (40 MG TOTAL) BY MOUTH DAILY.  Marland Kitchen gabapentin (NEURONTIN) 300 MG capsule TAKE 2 CAPSULE BY MOUTH TID  . levETIRAcetam (KEPPRA) 500 MG tablet TAKE 2 TABLETS BY MOUTH 2 TIMES DAILY  . loratadine (CLARITIN) 10 MG tablet Take 10 mg by mouth as needed. For allergies  . Multiple Vitamin (MULTIVITAMIN WITH MINERALS) TABS Take 1 tablet by mouth daily. Reported on 06/21/2015  . nitroGLYCERIN (NITROSTAT) 0.4 MG SL tablet Place 1 tablet (0.4 mg total) under the tongue every 5 (five) minutes as needed for chest pain.  . Nutritional Supplements (ESTROVEN PMS) TABS Take 1 tablet by mouth daily.   . polyethylene glycol powder (GLYCOLAX/MIRALAX) powder Take 17 g by  mouth daily as needed.  . potassium chloride SA (K-DUR,KLOR-CON) 20 MEQ tablet Take 1 tablet (20 mEq total) by mouth 2 (two) times daily.  . traZODone (DESYREL) 50 MG tablet TAKE 1 TO 3 TABLETS AT BEDTIME     1. SVT (supraventricular tachycardia) ([place])  Patient has not had any recent episodes.  2. Neuropathy of right upper extremity  Has had constant arm pan for awhile. SHe is on a fentanyl 62mcg patch every 3 days- stilll has some pain  3. SIRS (systemic inflammatory response syndrome) (HCC)   they are not 100% sure what is wrong with her but being followed by rheumatologist  4. Seizures ([place])  No seizure activity since last visit. Has not seen neurologist in awhile  5. Presence of vena cava filter  Has had for several years- but was taken out last year  6. Peripheral edema  Has swelling if she is on her feet alot  7. Morbid obesity ([place])  Has had gastric bypass in the past. Saw bariatirc doctor after she developed a small bowel obstruction. Had to have laproscopic surgery .  8. Non-Hodgkin's lymphoma, unspecified body region, unspecified non-Hodgkin lymphoma type [facility]) last saw hematology and appears to currently be in remission  9. Insomnia, unspecified type  Takes trazadone to sleep at night  10. Hypokalemia  No c/o of lower ext cramping  11. Hyperlipidemia with target LDL less than 100  Does  notwatch diet and does no exercise   12. Depression, unspecified depression type  Patient has been on prozac for many years. Doing well right nw. Depression screen [facility] 2/9 02/06/2017 12/24/2016 10/27/2016 08/25/2016 07/22/2016  Decreased Interest 0 0 0 0 0  Down, Depressed, Hopeless 0 0 0 0 0  PHQ - 2 Score 0 0 0 0 0  Altered sleeping - - - - -  Tired, decreased energy - - - - -  Change in appetite - - - - -  Feeling bad or failure about yourself  - - - - -  Trouble concentrating - - - - -  Moving slowly or fidgety/restless - - - - -  Suicidal thoughts - - - - -  PHQ-9 Score - - - - -    Some recent data might be hidden       New complaints: * is due for abdominioplasty and have excess skin removed from thighs and vaginal area in november  Social history: Lives with husband- helps take care of grandchildren when she can  Review of Systems  Constitutional: Negative.   HENT: Negative.   Respiratory: Negative.   Cardiovascular: Positive for leg swelling.  Gastrointestinal: Positive for abdominal pain (occasional). Negative for constipation, diarrhea, nausea and vomiting.  Genitourinary: Negative.   Musculoskeletal: Positive for myalgias.  Neurological: Negative.   Psychiatric/Behavioral: Negative.   All other systems reviewed and are negative.      Objective:   Physical Exam  Constitutional: She is oriented to person, place, and time. She appears well-developed and well-nourished.  HENT:  Nose: Nose normal.  Mouth/Throat: Oropharynx is clear and moist.  Eyes: EOM are normal.  Neck: Trachea normal, normal range of motion and full passive range of motion without pain. Neck supple. No JVD present. Carotid bruit is not present. No thyromegaly present.  Cardiovascular: Normal rate, regular rhythm, normal heart sounds and intact distal pulses.  Exam reveals no gallop and no friction rub.   No murmur heard. Pulmonary/Chest: Effort normal and breath sounds normal.  Abdominal: Soft. Bowel sounds are normal. She exhibits no distension and no mass. There is tenderness (secondary to previous surgery ).  Excessive skin on abdominal wall  Musculoskeletal: Normal range of motion.  Lymphadenopathy:    She has no cervical adenopathy.  Neurological: She is alert and oriented to person, place, and time. She has normal reflexes.  Skin: Skin is warm and dry.  Psychiatric: She has a normal mood and affect. Her behavior is normal. Judgment and thought content normal.   BP 102/72   Pulse 64   Temp (!) 96.8 F (36 C) (Oral)   Ht 5\' 1"  (1.549 m)   Wt 182 lb (82.6 kg)   BMI  34.39 kg/m         Assessment & Plan:  1. SVT (supraventricular tachycardia) (HCC) Avoid caffeine  2. Neuropathy of right upper extremity Continue fentanly patch- has appointmnet with pain clinic in november  3. SIRS (systemic inflammatory response syndrome) (HCC) Keep follow up with rheumatologist  4. Seizures ([place]) Need to follow up with neurologist - levETIRAcetam (KEPPRA) 500 MG tablet; TAKE 2 TABLETS BY MOUTH 2 TIMES DAILY  Dispense: 120 tablet; Refill: 5   5. Presence of vena cava filter Was removed several years ago- chart updated  6. Peripheral edema Elevate legs when sitting.mmmb - furosemide (LASIX) 40 MG tablet; TAKE 1 TABLET (40 MG TOTAL) BY MOUTH DAILY.  Dispense: 30 tablet; Refill: 5  7. Morbid obesity ([place])  Discussed diet and exercise for person with BMI >25 Will recheck weight in 3-6 months   8. Non-Hodgkin's lymphoma, unspecified body region, unspecified non-Hodgkin lymphoma type [facility]) Keep follow up with oncology  9. Insomnia, unspecified type Bedtime routine - traZODone (DESYREL) 50 MG tablet; TAKE 1 TO 3 TABLETS AT BEDTIME  Dispense: 270 tablet; Refill: 1   10. Hypokalemia - potassium chloride SA (K-DUR,KLOR-CON) 20 MEQ tablet; Take 1 tablet (20 mEq total) by mouth 2 (two) times daily.  Dispense: 30 tablet; Refill: 5  11. Hyperlipidemia with target LDL less than 100 Low fat diet - atorvastatin (LIPITOR) 40 MG tablet; Take 1 tablet (40 mg total) by mouth at bedtime.  Dispense: 30 tablet; Refill: 5  12. Depression, unspecified depression type Stress management - FLUoxetine (PROZAC) 40 MG capsule; Take 2 capsules (80 mg total) by mouth daily.  Dispense: 60 capsule; Refill: 5   13. Neuropathy of upper extremity, right - fentaNYL (DURAGESIC) 50 MCG/HR; Place 1 patch (50 mcg total) onto the skin every 3 (three) days.  Dispense: 10 patch; Refill: 0 - fentaNYL (DURAGESIC) 50 MCG/HR; Place 1 patch (50 mcg total) onto the skin every 3 (three) days.   Dispense: 5 patch; Refill: 0 - fentaNYL (DURAGESIC) 50 MCG/HR; Place 1 patch (50 mcg total) onto the skin every 3 (three) days.  Dispense: 5 patch; Refill: 0  14. Neuropathy of upper extremity, unspecified laterality - gabapentin (NEURONTIN) 300 MG capsule; TAKE 2 CAPSULE BY MOUTH TID  Dispense: 180 capsule; Refill: 5     Labs pending Health maintenance reviewed Diet and exercise encouraged Continue all meds Follow up  In 3 months   [place], FNP

## 2017-02-06 NOTE — Patient Instructions (Signed)
Stress and Stress Management Stress is a normal reaction to life events. It is what you feel when life demands more than you are used to or more than you can handle. Some stress can be useful. For example, the stress reaction can help you catch the last bus of the day, study for a test, or meet a deadline at work. But stress that occurs too often or for too long can cause problems. It can affect your emotional health and interfere with relationships and normal daily activities. Too much stress can weaken your immune system and increase your risk for physical illness. If you already have a medical problem, stress can make it worse. What are the causes? All sorts of life events may cause stress. An event that causes stress for one person may not be stressful for another person. Major life events commonly cause stress. These may be positive or negative. Examples include losing your job, moving into a new home, getting married, having a baby, or losing a loved one. Less obvious life events may also cause stress, especially if they occur day after day or in combination. Examples include working long hours, driving in traffic, caring for children, being in debt, or being in a difficult relationship. What are the signs or symptoms? Stress may cause emotional symptoms including, the following:  Anxiety. This is feeling worried, afraid, on edge, overwhelmed, or out of control.  Anger. This is feeling irritated or impatient.  Depression. This is feeling sad, down, helpless, or guilty.  Difficulty focusing, remembering, or making decisions.  Stress may cause physical symptoms, including the following:  Aches and pains. These may affect your head, neck, back, stomach, or other areas of your body.  Tight muscles or clenched jaw.  Low energy or trouble sleeping.  Stress may cause unhealthy behaviors, including the following:  Eating to feel better (overeating) or skipping meals.  Sleeping too little,  too much, or both.  Working too much or putting off tasks (procrastination).  Smoking, drinking alcohol, or using drugs to feel better.  How is this diagnosed? Stress is diagnosed through an assessment by your health care provider. Your health care provider will ask questions about your symptoms and any stressful life events.Your health care provider will also ask about your medical history and may order blood tests or other tests. Certain medical conditions and medicine can cause physical symptoms similar to stress. Mental illness can cause emotional symptoms and unhealthy behaviors similar to stress. Your health care provider may refer you to a mental health professional for further evaluation. How is this treated? Stress management is the recommended treatment for stress.The goals of stress management are reducing stressful life events and coping with stress in healthy ways. Techniques for reducing stressful life events include the following:  Stress identification. Self-monitor for stress and identify what causes stress for you. These skills may help you to avoid some stressful events.  Time management. Set your priorities, keep a calendar of events, and learn to say "no." These tools can help you avoid making too many commitments.  Techniques for coping with stress include the following:  Rethinking the problem. Try to think realistically about stressful events rather than ignoring them or overreacting. Try to find the positives in a stressful situation rather than focusing on the negatives.  Exercise. Physical exercise can release both physical and emotional tension. The key is to find a form of exercise you enjoy and do it regularly.  Relaxation techniques. These relax the body and  mind. Examples include yoga, meditation, tai chi, biofeedback, deep breathing, progressive muscle relaxation, listening to music, being out in nature, journaling, and other hobbies. Again, the key is to find  one or more that you enjoy and can do regularly.  Healthy lifestyle. Eat a balanced diet, get plenty of sleep, and do not smoke. Avoid using alcohol or drugs to relax.  Strong support network. Spend time with family, friends, or other people you enjoy being around.Express your feelings and talk things over with someone you trust.  Counseling or talktherapy with a mental health professional may be helpful if you are having difficulty managing stress on your own. Medicine is typically not recommended for the treatment of stress.Talk to your health care provider if you think you need medicine for symptoms of stress. Follow these instructions at home:  Keep all follow-up visits as directed by your health care provider.  Take all medicines as directed by your health care provider. Contact a health care provider if:  Your symptoms get worse or you start having new symptoms.  You feel overwhelmed by your problems and can no longer manage them on your own. Get help right away if:  You feel like hurting yourself or someone else. This information is not intended to replace advice given to you by your health care provider. Make sure you discuss any questions you have with your health care provider. Document Released: 09/24/2000 Document Revised: 09/06/2015 Document Reviewed: 11/23/2012 Elsevier Interactive Patient Education  2017 [employer].

## 2017-02-07 LAB — CMP14+EGFR
ALK PHOS: 153 IU/L — AB (ref 39–117)
ALT: 45 IU/L — AB (ref 0–32)
AST: 33 IU/L (ref 0–40)
Albumin/Globulin Ratio: 2 (ref 1.2–2.2)
Albumin: 5 g/dL (ref 3.5–5.5)
BUN/Creatinine Ratio: 18 (ref 9–23)
BUN: 19 mg/dL (ref 6–24)
Bilirubin Total: 0.7 mg/dL (ref 0.0–1.2)
CO2: 28 mmol/L (ref 20–29)
CREATININE: 1.08 mg/dL — AB (ref 0.57–1.00)
Calcium: 9.7 mg/dL (ref 8.7–10.2)
Chloride: 96 mmol/L (ref 96–106)
GFR calc Af Amer: 67 mL/min/{1.73_m2} (ref >59)
GFR calc non Af Amer: 58 mL/min/{1.73_m2} — ABNORMAL LOW (ref >59)
GLUCOSE: 90 mg/dL (ref 65–99)
Globulin, Total: 2.5 g/dL (ref 1.5–4.5)
Potassium: 3.5 mmol/L (ref 3.5–5.2)
Sodium: 144 mmol/L (ref 134–144)
Total Protein: 7.5 g/dL (ref 6.0–8.5)

## 2017-02-07 LAB — LIPID PANEL
CHOLESTEROL TOTAL: 152 mg/dL (ref 100–199)
Chol/HDL Ratio: 2.5 ratio (ref 0.0–4.4)
HDL: 61 mg/dL (ref >39)
LDL CALC: 64 mg/dL (ref 0–99)
TRIGLYCERIDES: 136 mg/dL (ref 0–149)
VLDL CHOLESTEROL CAL: 27 mg/dL (ref 5–40)

## 2017-02-12 NOTE — H&P (Signed)
Subjective:     Patient ID: Angela Michael is a 56 y.o. female.  HPI  Here for follow up discussion prior to planned lower body lift. History morbid obesity and underwent Roux en Y in 2007. Highest wt 525 lb, lowest wt 173 lb. Over last year reports 20 lb increase wt due to stress. Stable since last visit here. Main concerns are difficulty walking, odor, frequent rashes abdomen, back, and perineum due to heat/moisture and back soft tissue being caught in clothing. This leads to wounds. She has had prior panniculectomy/lower body lift at [facility] [place] recall year. Also had breast lift/reduction with Dr. Eugene Garnet.   She has a history of follicular lymphoma treated with chemotherapy, last CT neck 6.2018 normal. CT CAP 10/2016 with no evidence of new adenopathy. LAst Oncology follow up 11/2016 and recommend continuing clinical exams with labs every 4 months and yearly repeat imaging, next due 11/2017.  Last MMG 6/18 per patient normal.  She had a negative nuclear stress test at [facility] in August 2016. She had a cardiac cath at [facility] in 2017. Her disease had progressed to RCA 50% stenosis, 30% prox and mild LAD. She also has a history OSA resolved with wt loss, DVT RUE with an IVC filter, filter has been removed due to migration.Hx SVT, DM resolved with wt loss. Has a history seizure disorder, on Keppra, states last seizure > 5 years ago. Has self administered Lovenox shots in past.   Has chronic pain, on Fentanyl patch. Has pain contract with her PCP.  Since last visit, had admission for SBO. Underwent diagnostic laparoscopy which was normal. States symptoms resolved at this time.  Review of Systems     Objective:   Physical Exam  Cardiovascular: Normal rate, regular rhythm and normal heart sounds.   Pulmonary/Chest: Effort normal and breath sounds normal.  Abdomen: absent umbilicus, fine line scar low abdomen that extends across flanks to near back midline bilateral, no  overhang anterior abdominal soft tissue but significant laxity with severe ptosis mons  Back: scars as above, panniculus present over sacrum  Thighs: redundant inelastic soft tissue >8 cm soft tissue pinch bilateral extending to knee.  Breasts: no masses, pseudoptosis bilateral with fine line Wise pattern scars Lack upper pole fullness, nipples without inversion or depression  SN to nipple R 30 L 33 cm BW R 24 L 26 cm Nipple to IMF R 12 L 11 Assessment:     Panniculitis recurrent Obesity Hx Roux en Y bypass Hx panniculectomy    Plan:      Plan repeat panniculectomy anterior abdomen and panniculectomy back or lower body lift. Counseled though she does not have significant abdominal soft tissue overhang, this would be approach to deal with her mons ptosis and perineal hygiene issues.  Plan utilize prior scars, will have circumferential scars. Reviewed multiple drains, repositioning during surgery, hospital stay, post op limitations. Counseled I anticipate high likelihood wound healing problems, need for local wound care. Counseled these procedures will not address the laxity and redundant tissue of thighs.  Last visit expressed dissatisfaction with breast reduction outcome- reviewed the quality of her skin post MWL will relax and develop ptosis quickly. This includes her abdomen and back.  Additional risks including but not limited to bleeding, hematoma, seroma, unacceptable cosmetic result, recurrence, changes with wt gain or loss, DVT/PE, cardiopulmonary complications, fat necrosis, prolonged wound healing, need for additional surgery, infection, damage to deeper structures reviewed.  With regards to her pain contract- I will provide  narcotics as needed for 6 weeks post operative and if continued requirement after that time will defer to PCP. Patient in agreement.   Irene Limbo, MD [facility] Plastic & Reconstructive Surgery [phone], pin [phone]

## 2017-02-20 HISTORY — PX: TOOTH EXTRACTION: SUR596

## 2017-02-21 ENCOUNTER — Other Ambulatory Visit: Payer: Self-pay | Admitting: Nurse Practitioner

## 2017-02-24 ENCOUNTER — Telehealth: Payer: Self-pay | Admitting: Nurse Practitioner

## 2017-02-25 NOTE — Pre-Procedure Instructions (Signed)
Angela Michael  02/25/2017      [id number] [place], [place] [address]  [address] Phone: [phone] Fax: [phone]    Your procedure is scheduled on Monday, March 02, 2017  Report to [facility] Admitting Entrance "A" at 5:30A.M.   Call this number if you have problems the morning of surgery:  [phone]   Remember:  Do not eat food or drink liquids after midnight.  Take these medicines the morning of surgery with A SIP OF WATER: FLUoxetine (PROZAC), Gabapentin (NEURONTIN), and LevETIRAcetam (KEPPRA). If needed Acetaminophen (TYLENOL) for pain, Cyclobenzaprine (FLEXERIL) for spasms, Fluticasone (FLONASE) for allergies, and NitroGLYCERIN (NITROSTAT) for chest pain (Notify your nurse if you had to take this medicine).  Follow your doctor's instruction regarding Aspirin.  As of today, stop taking all Aspirin products, Vitamins, Fish oils, and Herbal medications. Also stop all NSAIDS i.e. Advil, Ibuprofen, Motrin, Aleve, Anaprox, Naproxen, BC and Goody Powders.   Do not wear jewelry, make-up or nail polish.  Do not wear lotions, powders,  perfumes, or deodorant.  Do not shave 48 hours prior to surgery.   Do not bring valuables to the hospital.  [facility] is not responsible for any belongings or valuables.  Contacts, dentures or bridgework may not be worn into surgery.  Leave your suitcase in the car.  After surgery it may be brought to your room.  For patients admitted to the hospital, discharge time will be determined by your treatment team.  Patients discharged the day of surgery will not be allowed to drive home.   Special instructions:  [facility]- Preparing For Surgery  Before surgery, you can play an important role. Because skin is not sterile, your skin needs to be as free of germs as possible. You can reduce the number of germs on your skin by washing with CHG (chlorahexidine gluconate) Soap  before surgery.  CHG is an antiseptic cleaner which kills germs and bonds with the skin to continue killing germs even after washing.  Please do not use if you have an allergy to CHG or antibacterial soaps. If your skin becomes reddened/irritated stop using the CHG.  Do not shave (including legs and underarms) for at least 48 hours prior to first CHG shower. It is OK to shave your face.  Please follow these instructions carefully.   1. Shower the NIGHT BEFORE SURGERY and the MORNING OF SURGERY with CHG.   2. If you chose to wash your hair, wash your hair first as usual with your normal shampoo.  3. After you shampoo, rinse your hair and body thoroughly to remove the shampoo.  4. Use CHG as you would any other liquid soap. You can apply CHG directly to the skin and wash gently with a scrungie or a clean washcloth.   5. Apply the CHG Soap to your body ONLY FROM THE NECK DOWN.  Do not use on open wounds or open sores. Avoid contact with your eyes, ears, mouth and genitals (private parts). Wash Face and genitals (private parts)  with your normal soap.  6. Wash thoroughly, paying special attention to the area where your surgery will be performed.  7. Thoroughly rinse your body with warm water from the neck down.  8. DO NOT shower/wash with your normal soap after using and rinsing off the CHG Soap.  9. Pat yourself dry with a CLEAN TOWEL.  10. Wear CLEAN PAJAMAS to bed the night before  surgery, wear comfortable clothes the morning of surgery  11. Place CLEAN SHEETS on your bed the night of your first shower and DO NOT SLEEP WITH PETS.  Day of Surgery: Do not apply any deodorants/lotions. Please wear clean clothes to the hospital/surgery center.    Please read over the following fact sheets that you were given. Pain Booklet, Coughing and Deep Breathing and Surgical Site Infection Prevention

## 2017-02-26 ENCOUNTER — Encounter (HOSPITAL_COMMUNITY)
Admission: RE | Admit: 2017-02-26 | Discharge: 2017-02-26 | Disposition: A | Discharge status: Home / Self Care | Payer: Medicaid Other | Source: Ambulatory Visit | Attending: Plastic Surgery | Admitting: Plastic Surgery

## 2017-02-26 ENCOUNTER — Other Ambulatory Visit: Payer: Self-pay

## 2017-02-26 ENCOUNTER — Encounter (HOSPITAL_COMMUNITY): Payer: Self-pay

## 2017-02-26 DIAGNOSIS — M793 Panniculitis, unspecified: Secondary | ICD-10-CM | POA: Diagnosis not present

## 2017-02-26 HISTORY — DX: Presence of spectacles and contact lenses: Z97.3

## 2017-02-26 HISTORY — DX: Headache: R51

## 2017-02-26 HISTORY — DX: Sleep apnea, unspecified: G47.30

## 2017-02-26 HISTORY — DX: Headache, unspecified: R51.9

## 2017-02-26 HISTORY — DX: Cardiac murmur, unspecified: R01.1

## 2017-02-26 HISTORY — DX: Panniculitis, unspecified: M79.3

## 2017-02-26 LAB — COMPREHENSIVE METABOLIC PANEL
ALT: 43 U/L (ref 14–54)
ANION GAP: 11 (ref 5–15)
AST: 38 U/L (ref 15–41)
Albumin: 4.1 g/dL (ref 3.5–5.0)
Alkaline Phosphatase: 124 U/L (ref 38–126)
BUN: 15 mg/dL (ref 6–20)
CALCIUM: 9 mg/dL (ref 8.9–10.3)
CO2: 27 mmol/L (ref 22–32)
CREATININE: 0.99 mg/dL (ref 0.44–1.00)
Chloride: 101 mmol/L (ref 101–111)
GFR calc non Af Amer: >60 mL/min (ref >60)
Glucose, Bld: 137 mg/dL — ABNORMAL HIGH (ref 65–99)
Potassium: 3.6 mmol/L (ref 3.5–5.1)
SODIUM: 139 mmol/L (ref 135–145)
TOTAL PROTEIN: 7 g/dL (ref 6.5–8.1)
Total Bilirubin: 1 mg/dL (ref 0.3–1.2)

## 2017-02-26 LAB — CBC WITH DIFFERENTIAL/PLATELET
BASOS ABS: 0 10*3/uL (ref 0.0–0.1)
BASOS PCT: 0 %
EOS ABS: 0.1 10*3/uL (ref 0.0–0.7)
Eosinophils Relative: 2 %
HCT: 43.2 % (ref 36.0–46.0)
Hemoglobin: 14.5 g/dL (ref 12.0–15.0)
Lymphocytes Relative: 23 %
Lymphs Abs: 1.5 10*3/uL (ref 0.7–4.0)
MCH: 32.1 pg (ref 26.0–34.0)
MCHC: 33.6 g/dL (ref 30.0–36.0)
MCV: 95.6 fL (ref 78.0–100.0)
MONO ABS: 0.4 10*3/uL (ref 0.1–1.0)
Monocytes Relative: 6 %
NEUTROS ABS: 4.7 10*3/uL (ref 1.7–7.7)
NEUTROS PCT: 69 %
PLATELETS: 140 10*3/uL — AB (ref 150–400)
RBC: 4.52 MIL/uL (ref 3.87–5.11)
RDW: 12.4 % (ref 11.5–15.5)
WBC: 6.7 10*3/uL (ref 4.0–10.5)

## 2017-02-26 LAB — HCG, SERUM, QUALITATIVE: PREG SERUM: NEGATIVE

## 2017-02-26 NOTE — Progress Notes (Signed)
Anesthesia Chart Review:  Pt is a 56 -year-old female scheduled for abdominoplasty/panniculectomy, back lipectomy on 03/02/2017 with Irene Limbo, MD  - PCP is Mary-Margaret Hassell Done, NP - Cardiologist is Minus Breeding, MD. Last office visit 10/02/16. F/u in 18 months recommended.  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 Never smoker. BMI 35. S/p gastric bypass 2007  - Hospitalized 8/29-31/18 for small bowel obstruction   Medications include: ASA 81 mg, Lipitor, fentanyl patch, Lasix, Keppra, potassium  BP 108/75   Pulse 77   Temp 36.8 C   Resp 20   Ht 5' 1.75" (1.568 m)   Wt 190 lb 1.6 oz (86.2 kg)   SpO2 97%   BMI 35.05 kg/m   Preoperative labs reviewed.    CXR 12/10/16: No active cardiopulmonary disease.  EKG 12/11/16: Sinus tachycardia (108 bpm). Low voltage QRS. Inferior infarct, age undetermined. Cannot rule out Anterior infarct , age undetermined  Cardiac cath 04/24/15 (care everywhere):  - LAD: proximal 30%, mid 50% - CX: mid 20% - RCA: proximal 50%. 1st RPL 30%  Echo 11/23/14:  - LV mildly dilated. Normal LV wall thickness. LV systolic function is normal. LV ejection fraction = 55-60%. LV wall motion is normal. LV filling pattern is indeterminate. - RV is normal in size and function. - LA is mildly dilated. - RA is mildly dilated. - Aortic valve sclerosis. - Mild tricuspid regurgitation. - Mild pulmonary hypertension. - There is no pericardial effusion.  If no changes, I anticipate pt can proceed with surgery as scheduled.   Willeen Cass, FNP-BC [facility] Short Stay Surgical Center/Anesthesiology Phone: [phone] 02/26/2017 7:22 PM

## 2017-02-26 NOTE — Pre-Procedure Instructions (Signed)
Angela Michael  02/26/2017      [id number] [place], [place] - [address]  [address] Phone: [phone] Fax: [phone]    Your procedure is scheduled on Monday, March 02, 2017  Report to [facility] Admitting at 5:30 A.M.  Call this number if you have problems the morning of surgery:  [phone]   Remember:  Do not eat food or drink liquids after midnight Sunday, Nov. 18  Take these medicines the morning of surgery with A SIP OF WATER : FLUoxetine (PROZAC), gabapentin (NEURONTIN), levETIRAcetam (KEPPRA), if needed: acetaminophen (TYLENOL) for pain or headache,  loratadine (CLARITIN) for allergies,  fluticasone (FLONASE)nasal spray for allergies, nitroGLYCERIN (NITROSTAT) for chest pain Follow doctors instructions regarding Aspirin Stop taking vitamins, fish oil and herbal medications. Do not take any NSAIDs ie: Ibuprofen, Advil, Naproxen (Aleve), Motrin, BC and Goody Powder or any medication containing Aspirin; stop now.   Do not wear jewelry, make-up or nail polish.  Do not wear lotions, powders, or perfumes, or deodorant.  Do not shave 48 hours prior to surgery.    Do not bring valuables to the hospital.  [facility] is not responsible for any belongings or valuables.  Contacts, dentures or bridgework may not be worn into surgery.  Leave your suitcase in the car.  After surgery it may be brought to your room. For patients admitted to the hospital, discharge time will be determined by your treatment team. Patients discharged the day of surgery will not be allowed to drive home.  Special instructions:  [place] - Preparing for Surgery  Before surgery, you can play an important role.  Because skin is not sterile, your skin needs to be as free of germs as possible.  You can reduce the number of germs on you skin by washing with CHG (chlorahexidine gluconate) soap before surgery.  CHG is an antiseptic cleaner  which kills germs and bonds with the skin to continue killing germs even after washing.  Please DO NOT use if you have an allergy to CHG or antibacterial soaps.  If your skin becomes reddened/irritated stop using the CHG and inform your nurse when you arrive at Short Stay.  Do not shave (including legs and underarms) for at least 48 hours prior to the first CHG shower.  You may shave your face.  Please follow these instructions carefully:   1.  Shower with CHG Soap the night before surgery and the morning of Surgery.  2.  If you choose to wash your hair, wash your hair first as usual with your normal shampoo.  3.  After you shampoo, rinse your hair and body thoroughly to remove the Shampoo.  4.  Use CHG as you would any other liquid soap.  You can apply chg directly  to the skin and wash gently with scrungie or a clean washcloth.  5.  Apply the CHG Soap to your body ONLY FROM THE NECK DOWN.  Do not use on open wounds or open sores.  Avoid contact with your eyes, ears, mouth and genitals (private parts).  Wash genitals (private parts) with your normal soap.  6.  Wash thoroughly, paying special attention to the area where your surgery will be performed.  7.  Thoroughly rinse your body with warm water from the neck down.  8.  DO NOT shower/wash with your normal soap after using and rinsing off the CHG Soap.  9.  Pat yourself dry with  a clean towel.            10.  Wear clean pajamas.            11.  Place clean sheets on your bed the night of your first shower and do not sleep with pets.  Day of Surgery  Do not apply any lotions/deodorants the morning of surgery.  Please wear clean clothes to the hospital/surgery center.  Please read over the following fact sheets that you were given. Pain Booklet, Coughing and Deep Breathing and Surgical Site Infection Prevention

## 2017-02-26 NOTE — Progress Notes (Signed)
Pt denies SOB and chest pain. Pt under the care of Dr. Percival Spanish, Cardiology. Pt denies recent labs. Spoke with Crystal, [place] regarding pre-op Aspirin instructions.; Crystal stated that she would follow up with pt and document action. Pt chart forwarded to anesthesia for review.

## 2017-03-01 NOTE — Anesthesia Preprocedure Evaluation (Addendum)
Anesthesia Evaluation  Patient identified by MRN, date of birth, ID band Patient awake    Reviewed: Allergy & Precautions, H&P , NPO status , Patient's Chart, lab work & pertinent test results  Airway Mallampati: I  TM Distance: >3 FB Neck ROM: Full    Dental  (+) Teeth Intact, Dental Advisory Given   Pulmonary neg pulmonary ROS,    breath sounds clear to auscultation       Cardiovascular Exercise Tolerance: Good negative cardio ROS   Rhythm:Regular Rate:Normal     Neuro/Psych  Headaches, Seizures -, Well Controlled,  Depression negative neurological ROS  negative psych ROS   GI/Hepatic negative GI ROS, Neg liver ROS,   Endo/Other  negative endocrine ROS  Renal/GU negative Renal ROS  negative genitourinary   Musculoskeletal   Abdominal   Peds  Hematology negative hematology ROS (+) anemia ,   Anesthesia Other Findings   Reproductive/Obstetrics negative OB ROS                           Anesthesia Physical Anesthesia Plan  ASA: II  Anesthesia Plan: General   Post-op Pain Management:    Induction: Intravenous  PONV Risk Score and Plan: 4 or greater and Ondansetron, Dexamethasone and Midazolam  Airway Management Planned: Oral ETT  Additional Equipment:   Intra-op Plan:   Post-operative Plan: Extubation in OR  Informed Consent: I have reviewed the patients History and Physical, chart, labs and discussed the procedure including the risks, benefits and alternatives for the proposed anesthesia with the patient or authorized representative who has indicated his/her understanding and acceptance.   Dental advisory given  Plan Discussed with: CRNA  Anesthesia Plan Comments:         Anesthesia Quick Evaluation

## 2017-03-02 ENCOUNTER — Ambulatory Visit (HOSPITAL_COMMUNITY): Payer: Medicaid Other | Admitting: Emergency Medicine

## 2017-03-02 ENCOUNTER — Inpatient Hospital Stay (HOSPITAL_COMMUNITY)
Admission: RE | Admit: 2017-03-02 | Discharge: 2017-03-04 | DRG: 572 | Disposition: A | Discharge status: Home / Self Care | Payer: Medicaid Other | Source: Ambulatory Visit | Attending: Plastic Surgery | Admitting: Plastic Surgery

## 2017-03-02 ENCOUNTER — Ambulatory Visit (HOSPITAL_COMMUNITY): Payer: Medicaid Other | Admitting: Certified Registered"

## 2017-03-02 ENCOUNTER — Encounter (HOSPITAL_COMMUNITY)
Admission: RE | Disposition: A | Discharge status: Home / Self Care | Payer: Self-pay | Source: Ambulatory Visit | Attending: Plastic Surgery

## 2017-03-02 ENCOUNTER — Encounter (HOSPITAL_COMMUNITY): Payer: Self-pay | Admitting: General Practice

## 2017-03-02 DIAGNOSIS — Z9884 Bariatric surgery status: Secondary | ICD-10-CM

## 2017-03-02 DIAGNOSIS — G40909 Epilepsy, unspecified, not intractable, without status epilepticus: Secondary | ICD-10-CM | POA: Diagnosis present

## 2017-03-02 DIAGNOSIS — M793 Panniculitis, unspecified: Principal | ICD-10-CM | POA: Diagnosis present

## 2017-03-02 DIAGNOSIS — Z86718 Personal history of other venous thrombosis and embolism: Secondary | ICD-10-CM

## 2017-03-02 DIAGNOSIS — G8929 Other chronic pain: Secondary | ICD-10-CM | POA: Diagnosis present

## 2017-03-02 HISTORY — PX: ABDOMINOPLASTY/PANNICULECTOMY: SHX5578

## 2017-03-02 HISTORY — PX: ABDOMINOPLASTY: SHX5355

## 2017-03-02 SURGERY — ABDOMINOPLASTY/PANNICULECTOMY
Anesthesia: General | Site: Back

## 2017-03-02 MED ORDER — FENTANYL CITRATE (PF) 250 MCG/5ML IJ SOLN
INTRAMUSCULAR | Status: AC
  Filled 2017-03-02: qty 5

## 2017-03-02 MED ORDER — ADULT MULTIVITAMIN W/MINERALS CH: 1.0000 | ORAL_TABLET | Freq: Every day | ORAL | Status: DC

## 2017-03-02 MED ORDER — ROCURONIUM BROMIDE 10 MG/ML (PF) SYRINGE
PREFILLED_SYRINGE | INTRAVENOUS | Status: AC
  Filled 2017-03-02: qty 5

## 2017-03-02 MED ORDER — ONDANSETRON 4 MG PO TBDP
4.0000 mg | ORAL_TABLET | Freq: Four times a day (QID) | ORAL | Status: DC | PRN
Start: 2017-03-02 — End: 2017-03-04

## 2017-03-02 MED ORDER — MIDAZOLAM HCL 5 MG/5ML IJ SOLN
INTRAMUSCULAR | Status: DC | PRN
  Administered 2017-03-02: 2 mg via INTRAVENOUS

## 2017-03-02 MED ORDER — ONDANSETRON HCL 4 MG/2ML IJ SOLN
INTRAMUSCULAR | Status: AC
  Filled 2017-03-02: qty 2

## 2017-03-02 MED ORDER — PHENYLEPHRINE 40 MCG/ML (10ML) SYRINGE FOR IV PUSH (FOR BLOOD PRESSURE SUPPORT)
PREFILLED_SYRINGE | INTRAVENOUS | Status: DC | PRN
Start: 2017-03-02 — End: 2017-03-02
  Administered 2017-03-02: 80 ug via INTRAVENOUS

## 2017-03-02 MED ORDER — HEPARIN SODIUM (PORCINE) 5000 UNIT/ML IJ SOLN
5000.0000 [IU] | Freq: Once | INTRAMUSCULAR | Status: AC
  Administered 2017-03-02: 5000 [IU] via SUBCUTANEOUS

## 2017-03-02 MED ORDER — NITROGLYCERIN 0.4 MG SL SUBL: 0.4000 mg | SUBLINGUAL_TABLET | SUBLINGUAL | Status: DC | PRN

## 2017-03-02 MED ORDER — GABAPENTIN 300 MG PO CAPS
600.0000 mg | ORAL_CAPSULE | Freq: Three times a day (TID) | ORAL | Status: DC
  Administered 2017-03-02 – 2017-03-04 (×6): 600 mg via ORAL
  Filled 2017-03-02 (×6): qty 2

## 2017-03-02 MED ORDER — LEVETIRACETAM 500 MG PO TABS
1000.0000 mg | ORAL_TABLET | Freq: Two times a day (BID) | ORAL | Status: DC
  Administered 2017-03-02 – 2017-03-04 (×4): 1000 mg via ORAL
  Filled 2017-03-02 (×4): qty 2

## 2017-03-02 MED ORDER — ROCURONIUM BROMIDE 10 MG/ML (PF) SYRINGE
PREFILLED_SYRINGE | INTRAVENOUS | Status: DC | PRN
Start: 2017-03-02 — End: 2017-03-02
  Administered 2017-03-02: 60 mg via INTRAVENOUS

## 2017-03-02 MED ORDER — FLUOXETINE HCL 20 MG PO CAPS
80.0000 mg | ORAL_CAPSULE | Freq: Every day | ORAL | Status: DC
  Administered 2017-03-02 – 2017-03-04 (×3): 80 mg via ORAL
  Filled 2017-03-02 (×3): qty 4

## 2017-03-02 MED ORDER — CHLORHEXIDINE GLUCONATE CLOTH 2 % EX PADS: 6.0000 | MEDICATED_PAD | Freq: Once | CUTANEOUS | Status: DC

## 2017-03-02 MED ORDER — HYDROMORPHONE HCL 1 MG/ML IJ SOLN
0.2500 mg | INTRAMUSCULAR | Status: DC | PRN
  Administered 2017-03-02 (×3): 0.5 mg via INTRAVENOUS

## 2017-03-02 MED ORDER — HYDROMORPHONE HCL 1 MG/ML IJ SOLN
INTRAMUSCULAR | Status: AC
  Filled 2017-03-02: qty 1

## 2017-03-02 MED ORDER — DOCUSATE SODIUM 100 MG PO CAPS: 100.0000 mg | ORAL_CAPSULE | Freq: Two times a day (BID) | ORAL | Status: DC | PRN

## 2017-03-02 MED ORDER — MIDAZOLAM HCL 2 MG/2ML IJ SOLN
INTRAMUSCULAR | Status: AC
  Filled 2017-03-02: qty 2

## 2017-03-02 MED ORDER — HYDROMORPHONE HCL 1 MG/ML IJ SOLN
0.5000 mg | INTRAMUSCULAR | Status: DC | PRN
  Administered 2017-03-02: 0.5 mg via INTRAVENOUS
  Filled 2017-03-02: qty 1

## 2017-03-02 MED ORDER — HEPARIN SODIUM (PORCINE) 5000 UNIT/ML IJ SOLN
INTRAMUSCULAR | Status: AC
  Administered 2017-03-02: 5000 [IU] via SUBCUTANEOUS
  Filled 2017-03-02: qty 1

## 2017-03-02 MED ORDER — CEFAZOLIN SODIUM 1 G IJ SOLR
INTRAMUSCULAR | Status: AC
  Filled 2017-03-02: qty 20

## 2017-03-02 MED ORDER — ONDANSETRON HCL 4 MG/2ML IJ SOLN
INTRAMUSCULAR | Status: DC | PRN
  Administered 2017-03-02: 4 mg via INTRAVENOUS

## 2017-03-02 MED ORDER — DEXAMETHASONE SODIUM PHOSPHATE 10 MG/ML IJ SOLN
INTRAMUSCULAR | Status: AC
  Filled 2017-03-02: qty 1

## 2017-03-02 MED ORDER — TAB-A-VITE/IRON PO TABS
1.0000 | ORAL_TABLET | Freq: Every day | ORAL | Status: DC
  Administered 2017-03-02 – 2017-03-03 (×2): 1 via ORAL
  Filled 2017-03-02 (×2): qty 1

## 2017-03-02 MED ORDER — FENTANYL CITRATE (PF) 100 MCG/2ML IJ SOLN
INTRAMUSCULAR | Status: DC | PRN
  Administered 2017-03-02 (×3): 50 ug via INTRAVENOUS
  Administered 2017-03-02: 100 ug via INTRAVENOUS
  Administered 2017-03-02 (×2): 50 ug via INTRAVENOUS

## 2017-03-02 MED ORDER — FENTANYL 50 MCG/HR TD PT72
50.0000 ug | MEDICATED_PATCH | TRANSDERMAL | Status: DC
  Administered 2017-03-02: 50 ug via TRANSDERMAL
  Filled 2017-03-02: qty 1

## 2017-03-02 MED ORDER — ACETAMINOPHEN 500 MG PO TABS: 1000.0000 mg | ORAL_TABLET | Freq: Four times a day (QID) | ORAL | Status: DC | PRN

## 2017-03-02 MED ORDER — PHENYLEPHRINE 40 MCG/ML (10ML) SYRINGE FOR IV PUSH (FOR BLOOD PRESSURE SUPPORT)
PREFILLED_SYRINGE | INTRAVENOUS | Status: AC
  Filled 2017-03-02: qty 10

## 2017-03-02 MED ORDER — KCL IN DEXTROSE-NACL 20-5-0.45 MEQ/L-%-% IV SOLN
INTRAVENOUS | Status: AC
  Filled 2017-03-02: qty 1000

## 2017-03-02 MED ORDER — INTEGRA 62.5-62.5-40-3 MG PO CAPS: 1.0000 | ORAL_CAPSULE | Freq: Every day | ORAL | Status: DC

## 2017-03-02 MED ORDER — DEXAMETHASONE SODIUM PHOSPHATE 10 MG/ML IJ SOLN
INTRAMUSCULAR | Status: DC | PRN
  Administered 2017-03-02: 10 mg via INTRAVENOUS

## 2017-03-02 MED ORDER — POLYETHYLENE GLYCOL 3350 17 GM/SCOOP PO POWD: 17.0000 g | Freq: Every day | ORAL | Status: DC | PRN

## 2017-03-02 MED ORDER — CEFAZOLIN SODIUM-DEXTROSE 2-4 GM/100ML-% IV SOLN
INTRAVENOUS | Status: AC
  Filled 2017-03-02: qty 100

## 2017-03-02 MED ORDER — LIDOCAINE 2% (20 MG/ML) 5 ML SYRINGE
INTRAMUSCULAR | Status: DC | PRN
  Administered 2017-03-02: 60 mg via INTRAVENOUS

## 2017-03-02 MED ORDER — BUPIVACAINE LIPOSOME 1.3 % IJ SUSP
20.0000 mL | INTRAMUSCULAR | Status: AC
  Administered 2017-03-02: 20 mL
  Filled 2017-03-02: qty 20

## 2017-03-02 MED ORDER — FUROSEMIDE 40 MG PO TABS
40.0000 mg | ORAL_TABLET | Freq: Every day | ORAL | Status: DC
  Administered 2017-03-03: 40 mg via ORAL
  Filled 2017-03-02 (×2): qty 1

## 2017-03-02 MED ORDER — PROPOFOL 10 MG/ML IV BOLUS
INTRAVENOUS | Status: AC
  Filled 2017-03-02: qty 20

## 2017-03-02 MED ORDER — POTASSIUM CHLORIDE CRYS ER 20 MEQ PO TBCR
20.0000 meq | EXTENDED_RELEASE_TABLET | Freq: Two times a day (BID) | ORAL | Status: DC
  Administered 2017-03-03 – 2017-03-04 (×3): 20 meq via ORAL
  Filled 2017-03-02 (×4): qty 1

## 2017-03-02 MED ORDER — ATORVASTATIN CALCIUM 40 MG PO TABS
40.0000 mg | ORAL_TABLET | Freq: Every day | ORAL | Status: DC
  Administered 2017-03-02 – 2017-03-03 (×2): 40 mg via ORAL
  Filled 2017-03-02 (×2): qty 1

## 2017-03-02 MED ORDER — OXYCODONE HCL 5 MG PO TABS
5.0000 mg | ORAL_TABLET | ORAL | Status: DC | PRN
  Administered 2017-03-02 – 2017-03-04 (×8): 10 mg via ORAL
  Filled 2017-03-02 (×9): qty 2

## 2017-03-02 MED ORDER — CEFAZOLIN SODIUM-DEXTROSE 2-4 GM/100ML-% IV SOLN
2.0000 g | INTRAVENOUS | Status: AC
  Administered 2017-03-02 (×2): 2 g via INTRAVENOUS

## 2017-03-02 MED ORDER — TRAZODONE HCL 150 MG PO TABS
150.0000 mg | ORAL_TABLET | Freq: Every day | ORAL | Status: DC
  Administered 2017-03-02 – 2017-03-03 (×2): 150 mg via ORAL
  Filled 2017-03-02 (×2): qty 1

## 2017-03-02 MED ORDER — PROPOFOL 10 MG/ML IV BOLUS
INTRAVENOUS | Status: DC | PRN
  Administered 2017-03-02: 100 mg via INTRAVENOUS

## 2017-03-02 MED ORDER — LACTATED RINGERS IV SOLN
INTRAVENOUS | Status: DC | PRN
  Administered 2017-03-02 (×2): via INTRAVENOUS

## 2017-03-02 MED ORDER — ENOXAPARIN SODIUM 40 MG/0.4ML ~~LOC~~ SOLN
40.0000 mg | SUBCUTANEOUS | Status: DC
  Administered 2017-03-03 – 2017-03-04 (×2): 40 mg via SUBCUTANEOUS
  Filled 2017-03-02 (×2): qty 0.4

## 2017-03-02 MED ORDER — ONDANSETRON HCL 4 MG/2ML IJ SOLN: 4.0000 mg | Freq: Four times a day (QID) | INTRAMUSCULAR | Status: DC | PRN

## 2017-03-02 MED ORDER — LIDOCAINE 2% (20 MG/ML) 5 ML SYRINGE
INTRAMUSCULAR | Status: AC
  Filled 2017-03-02: qty 5

## 2017-03-02 MED ORDER — CYCLOBENZAPRINE HCL 10 MG PO TABS
10.0000 mg | ORAL_TABLET | Freq: Three times a day (TID) | ORAL | Status: DC | PRN
  Administered 2017-03-02 – 2017-03-04 (×5): 10 mg via ORAL
  Filled 2017-03-02 (×5): qty 1

## 2017-03-02 MED ORDER — ENOXAPARIN (LOVENOX) PATIENT EDUCATION KIT
PACK | Freq: Once | Status: AC
  Administered 2017-03-03: 11:00:00
  Filled 2017-03-02 (×2): qty 1

## 2017-03-02 MED ORDER — 0.9 % SODIUM CHLORIDE (POUR BTL) OPTIME
TOPICAL | Status: DC | PRN
  Administered 2017-03-02 (×2): 1000 mL

## 2017-03-02 MED ORDER — POLYETHYLENE GLYCOL 3350 17 G PO PACK: 17.0000 g | PACK | Freq: Every day | ORAL | Status: DC | PRN

## 2017-03-02 MED ORDER — CEFAZOLIN SODIUM-DEXTROSE 2-4 GM/100ML-% IV SOLN
2.0000 g | Freq: Three times a day (TID) | INTRAVENOUS | Status: AC
  Administered 2017-03-02 – 2017-03-03 (×3): 2 g via INTRAVENOUS
  Filled 2017-03-02 (×3): qty 100

## 2017-03-02 MED ORDER — POTASSIUM CL IN DEXTROSE 5% 20 MEQ/L IV SOLN
20.0000 meq | INTRAVENOUS | Status: DC
  Administered 2017-03-02 – 2017-03-04 (×4): 20 meq via INTRAVENOUS
  Filled 2017-03-02 (×4): qty 1000

## 2017-03-02 MED ORDER — FENTANYL 50 MCG/HR TD PT72
50.0000 ug | MEDICATED_PATCH | TRANSDERMAL | Status: DC
  Filled 2017-03-02: qty 1

## 2017-03-02 SURGICAL SUPPLY — 71 items
APPLIER CLIP 9.375 MED OPEN (MISCELLANEOUS) ×4
BINDER ABD UNIV 12 45-62 (WOUND CARE) ×2 IMPLANT
BINDER ABDOMINAL 46IN 62IN (WOUND CARE) ×4
BLADE SURG 10 STRL SS (BLADE) ×8 IMPLANT
BLADE SURG 15 STRL LF DISP TIS (BLADE) ×2 IMPLANT
BLADE SURG 15 STRL SS (BLADE) ×2
CANISTER SUCT 3000ML PPV (MISCELLANEOUS) ×4 IMPLANT
CHLORAPREP W/TINT 26ML (MISCELLANEOUS) ×16 IMPLANT
CLIP APPLIE 9.375 MED OPEN (MISCELLANEOUS) ×2 IMPLANT
CLOSURE WOUND 1/2 X4 (GAUZE/BANDAGES/DRESSINGS)
COVER MAYO STAND STRL (DRAPES) IMPLANT
COVER SURGICAL LIGHT HANDLE (MISCELLANEOUS) ×4 IMPLANT
DERMABOND ADVANCED (GAUZE/BANDAGES/DRESSINGS) ×20
DERMABOND ADVANCED .7 DNX12 (GAUZE/BANDAGES/DRESSINGS) ×20 IMPLANT
DRAIN CHANNEL 15F RND FF W/TCR (WOUND CARE) ×16 IMPLANT
DRAIN CHANNEL 19F RND (DRAIN) IMPLANT
DRAPE HALF SHEET 40X57 (DRAPES) ×20 IMPLANT
DRAPE INCISE 23X17 IOBAN STRL (DRAPES)
DRAPE INCISE IOBAN 23X17 STRL (DRAPES) IMPLANT
DRAPE INCISE IOBAN 85X60 (DRAPES) IMPLANT
DRAPE ORTHO SPLIT 77X108 STRL (DRAPES) ×8
DRAPE SURG ORHT 6 SPLT 77X108 (DRAPES) ×8 IMPLANT
DRAPE WARM FLUID 44X44 (DRAPE) ×4 IMPLANT
DRSG MEPILEX BORDER 4X8 (GAUZE/BANDAGES/DRESSINGS) IMPLANT
DRSG PAD ABDOMINAL 8X10 ST (GAUZE/BANDAGES/DRESSINGS) ×32 IMPLANT
ELECT BLADE 4.0 EZ CLEAN MEGAD (MISCELLANEOUS) ×4
ELECT BLADE 6.5 EXT (BLADE) ×4 IMPLANT
ELECT COATED BLADE 2.86 ST (ELECTRODE) ×4 IMPLANT
ELECT REM PT RETURN 9FT ADLT (ELECTROSURGICAL) ×8
ELECTRODE BLDE 4.0 EZ CLN MEGD (MISCELLANEOUS) ×2 IMPLANT
ELECTRODE REM PT RTRN 9FT ADLT (ELECTROSURGICAL) ×4 IMPLANT
EVACUATOR SILICONE 100CC (DRAIN) ×16 IMPLANT
GAUZE XEROFORM 5X9 LF (GAUZE/BANDAGES/DRESSINGS) IMPLANT
GLOVE BIO SURGEON STRL SZ 6 (GLOVE) ×16 IMPLANT
GLOVE BIOGEL PI IND STRL 8.5 (GLOVE) ×2 IMPLANT
GLOVE BIOGEL PI INDICATOR 8.5 (GLOVE) ×2
GLOVE ECLIPSE 8.0 STRL XLNG CF (GLOVE) ×8 IMPLANT
GOWN STRL REUS W/ TWL LRG LVL3 (GOWN DISPOSABLE) ×12 IMPLANT
GOWN STRL REUS W/ TWL XL LVL3 (GOWN DISPOSABLE) ×2 IMPLANT
GOWN STRL REUS W/TWL LRG LVL3 (GOWN DISPOSABLE) ×12
GOWN STRL REUS W/TWL XL LVL3 (GOWN DISPOSABLE) ×2
KIT BASIN OR (CUSTOM PROCEDURE TRAY) ×4 IMPLANT
NEEDLE 22X1 1/2 (OR ONLY) (NEEDLE) ×4 IMPLANT
NS IRRIG 1000ML POUR BTL (IV SOLUTION) ×8 IMPLANT
PACK GENERAL/GYN (CUSTOM PROCEDURE TRAY) ×4 IMPLANT
PAD ARMBOARD 7.5X6 YLW CONV (MISCELLANEOUS) ×12 IMPLANT
PEN SKIN MARKING BROAD (MISCELLANEOUS) ×4 IMPLANT
PENCIL BUTTON HOLSTER BLD 10FT (ELECTRODE) IMPLANT
SET COLLECT BLD 21X3/4 12 PB (MISCELLANEOUS) IMPLANT
SPONGE GAUZE 4X4 12PLY STER LF (GAUZE/BANDAGES/DRESSINGS) IMPLANT
SPONGE LAP 18X18 X RAY DECT (DISPOSABLE) ×16 IMPLANT
STAPLER VISISTAT 35W (STAPLE) ×8 IMPLANT
STOCKINETTE IMPERVIOUS 9X36 MD (GAUZE/BANDAGES/DRESSINGS) IMPLANT
STRIP CLOSURE SKIN 1/2X4 (GAUZE/BANDAGES/DRESSINGS) IMPLANT
SUT ETHILON 2 0 FS 18 (SUTURE) ×16 IMPLANT
SUT MNCRL AB 4-0 PS2 18 (SUTURE) ×28 IMPLANT
SUT PDS AB 0 CT 36 (SUTURE) ×36 IMPLANT
SUT PDS AB 2-0 CT1 27 (SUTURE) IMPLANT
SUT VIC AB 3-0 PS2 18 (SUTURE)
SUT VIC AB 3-0 PS2 18XBRD (SUTURE) IMPLANT
SUT VIC AB 3-0 SH 8-18 (SUTURE) IMPLANT
SUT VICRYL 4-0 PS2 18IN ABS (SUTURE) IMPLANT
SUT VLOC 180 0 24IN GS25 (SUTURE) ×32 IMPLANT
SYR 50ML SLIP (SYRINGE) IMPLANT
SYR BULB IRRIGATION 50ML (SYRINGE) ×4 IMPLANT
SYR CONTROL 10ML LL (SYRINGE) ×4 IMPLANT
TOWEL OR 17X24 6PK STRL BLUE (TOWEL DISPOSABLE) ×4 IMPLANT
TOWEL OR 17X26 10 PK STRL BLUE (TOWEL DISPOSABLE) ×4 IMPLANT
TRAY FOLEY W/METER SILVER 14FR (SET/KITS/TRAYS/PACK) ×4 IMPLANT
TUBE CONNECTING 12'X1/4 (SUCTIONS) ×1
TUBE CONNECTING 12X1/4 (SUCTIONS) ×3 IMPLANT

## 2017-03-02 NOTE — Interval H&P Note (Signed)
History and Physical Interval Note:  03/02/2017 6:43 AM  Angela Michael  has presented today for surgery, with the diagnosis of PANNICULITIS, PANNICULITIS BACK,HISTORY OF ROUXENY   The various methods of treatment have been discussed with the patient and family. After consideration of risks, benefits and other options for treatment, the patient has consented to  Procedure(s): ABDOMINOPLASTY/PANNICULECTOMY (N/A) BACK LIPECTOMY (N/A) as a surgical intervention .  The patient's history has been reviewed, patient examined, no change in status, stable for surgery.  I have reviewed the patient's chart and labs.  Questions were answered to the patient's satisfaction.     Antoine Vandermeulen

## 2017-03-02 NOTE — Op Note (Signed)
Operative Note   DATE OF OPERATION: 11.19.18  LOCATION: [facility] OR-observation  SURGICAL DIVISION: Plastic Surgery  PREOPERATIVE DIAGNOSES:  1. History massive weight loss 2. Panniculitis abdomen 3. Panniculitis back/buttock  POSTOPERATIVE DIAGNOSES:  same  PROCEDURE:  Belt lipectomy (Panniculectomy abdomen, panniculectomy buttock)  SURGEON: Irene Limbo MD MBA  ASSISTANTCaryl Asp RNFA  ANESTHESIA:  General.   EBL: 038 ml  COMPLICATIONS: None immediate.   INDICATIONS FOR PROCEDURE:  The patient, Angela Michael, is a 56 y.o. female born on 08/23/1960, is here for belt lipectomy following massive weight loss. She has had prior lower body lift.   FINDINGS: 2443 g back, buttock; 948 g abdomen resection  DESCRIPTION OF PROCEDURE:  The patient's operative site was marked with the patient in the preoperative area. The anticipated soft tissue resection marked in standing position, slightly bent at hip, over buttock. The abdomen resecction marked in supine position. The patient was taken to the operating room. SCDs were placed and IV antibiotics were given. Following induction, Foley catheter placed. The patient was placed in prone position. The patient's operative site was prepped and draped in a sterile fashion. A time out was performed and all information was confirmed to be correct.  The patient's prior surgical scars were near circumferential and these were utilized. Incision carried across midline back and carried through to Scarpa's fascia. Sub Scarpa fascia dissection completed caudally toward gluteal cleft. The buttock soft tissue was advanced superiorly and the area for resection marked by palpation. Soft tissue resected. Wounds irrigated and hemostasis obtained.15 Fr drain placed percutaneously bilaterally and secured with 2-0 nylon. Exparel infiltrated throughout incisions. Incision closed in layers with 0-PDS in Scarpas fascia. V-lock 0 suture used to approximate dermis.  4-0 monocryl subcuticular used for skin closure. Tissue adhesive applied to all incisions.  Patient then placed in supine position and redraped. Incision made in prior low transverse scar. She had umbilicus resected with prior surgery. Incision carried through superficial fascia down to abdominal wall. Sub- scarpas dissection completed toward costal margin. Care taken to preserve layer of fatty tissue over underlying abdominal wall fascia. Superior extent for resection marked by palpation through elevated skin-subcutaneous tissue. Panniculus resected. Wounds irrigated and hemostasis obtained. 15 Fr drain placed percutaneously bilaterally and secured with 2-0 nylon. Exparel infiltrated throughout incisions. 0 V lock suture used to plicate recuts diastasis. Addition 0 V lock used to plicate lateral abdominal wall bilateral. Low transverse incision closed in layers with 0-PDS in Scarpas fascia. V-lock 0 suture used to approximate dermis. 4-0 monocryl subcuticular used for skin closure. Tissue adhesive applied to all incisions, followed by dry dressing, abdominal binder.   The patient was allowed to wake from anesthesia, extubated and taken to the recovery room in satisfactory condition.   SPECIMENS: none  DRAINS: 15 Fr JP x four, two subcutaneous abdomen, two subcutaneous back  Irene Limbo, MD [facility] Plastic & Reconstructive Surgery [phone], pin [phone]

## 2017-03-02 NOTE — Progress Notes (Signed)
Patient reports having a tooth removed on 11/9 and stated that she was not started on antibiotic and Dr. Iran Planas was not made aware.  Notified Dr. Iran Planas morning of surgery.

## 2017-03-02 NOTE — Anesthesia Postprocedure Evaluation (Signed)
Anesthesia Post Note  Patient: Angela Michael  Procedure(s) Performed: ABDOMINOPLASTY/PANNICULECTOMY (N/A Abdomen) BACK LIPECTOMY (N/A Back)     Patient location during evaluation: PACU Anesthesia Type: General Level of consciousness: awake and alert Pain management: pain level controlled Vital Signs Assessment: post-procedure vital signs reviewed and stable Respiratory status: spontaneous breathing, nonlabored ventilation, respiratory function stable and patient connected to nasal cannula oxygen Cardiovascular status: blood pressure returned to baseline and stable Postop Assessment: no apparent nausea or vomiting Anesthetic complications: no    Last Vitals:  Vitals:   03/02/17 1251 03/02/17 1300  BP: 115/76   Pulse: 80 83  Resp: 13 12  Temp:  36.7 C  SpO2: 95% 94%    Last Pain:  Vitals:   03/02/17 1300  TempSrc:   PainSc: 3                  [place] Zirkelbach,W. EDMOND

## 2017-03-02 NOTE — Progress Notes (Signed)
Patient arrived to 6n03 alert and oriented from PACU, VSS, IV fluids infusing. Patient noted to have a large abdominal and back incision with skin glue, ABD pad, and AB binder and 4 total JP drains, and foley cath in place. Patient in moderate pain, will medicate per orders. Oriented to room and staff will continue to monitor.

## 2017-03-02 NOTE — Anesthesia Procedure Notes (Signed)
Procedure Name: Intubation Date/Time: 03/02/2017 7:21 AM Performed by: Moshe Salisbury, CRNA Pre-anesthesia Checklist: Patient identified, Emergency Drugs available, Suction available and Patient being monitored Patient Re-evaluated:Patient Re-evaluated prior to induction Oxygen Delivery Method: Circle System Utilized Preoxygenation: Pre-oxygenation with 100% oxygen Induction Type: IV induction Ventilation: Mask ventilation without difficulty Laryngoscope Size: Mac and 3 Grade View: Grade I Tube type: Oral Tube size: 7.5 mm Number of attempts: 1 Airway Equipment and Method: Stylet Placement Confirmation: ETT inserted through vocal cords under direct vision,  positive ETCO2 and breath sounds checked- equal and bilateral Secured at: 21 cm Tube secured with: Tape Dental Injury: Teeth and Oropharynx as per pre-operative assessment

## 2017-03-02 NOTE — Transfer of Care (Signed)
Immediate Anesthesia Transfer of Care Note  Patient: Angela Michael  Procedure(s) Performed: ABDOMINOPLASTY/PANNICULECTOMY (N/A Abdomen) BACK LIPECTOMY (N/A Back)  Patient Location: PACU  Anesthesia Type:General  Level of Consciousness: awake, oriented and patient cooperative  Airway & Oxygen Therapy: Patient Spontanous Breathing and Patient connected to nasal cannula oxygen  Post-op Assessment: Report given to RN and Post -op Vital signs reviewed and stable  Post vital signs: Reviewed and stable  Last Vitals:  Vitals:   03/02/17 0550  BP: 104/64  Pulse: 67  Resp: 18  Temp: 36.9 C  SpO2: 91%    Last Pain:  Vitals:   03/02/17 0629  TempSrc:   PainSc: 4       Patients Stated Pain Goal: 2 (23/55/73 2202)  Complications: No apparent anesthesia complications

## 2017-03-03 ENCOUNTER — Ambulatory Visit: Payer: Medicaid Other | Admitting: Vascular Surgery

## 2017-03-03 ENCOUNTER — Encounter (HOSPITAL_COMMUNITY): Payer: Self-pay | Admitting: Plastic Surgery

## 2017-03-03 ENCOUNTER — Encounter (HOSPITAL_COMMUNITY): Payer: Medicaid Other

## 2017-03-03 ENCOUNTER — Other Ambulatory Visit: Payer: Self-pay

## 2017-03-03 DIAGNOSIS — G8929 Other chronic pain: Secondary | ICD-10-CM | POA: Diagnosis present

## 2017-03-03 DIAGNOSIS — Z9884 Bariatric surgery status: Secondary | ICD-10-CM | POA: Diagnosis not present

## 2017-03-03 DIAGNOSIS — Z86718 Personal history of other venous thrombosis and embolism: Secondary | ICD-10-CM | POA: Diagnosis not present

## 2017-03-03 DIAGNOSIS — M793 Panniculitis, unspecified: Secondary | ICD-10-CM | POA: Diagnosis present

## 2017-03-03 DIAGNOSIS — G40909 Epilepsy, unspecified, not intractable, without status epilepticus: Secondary | ICD-10-CM | POA: Diagnosis present

## 2017-03-03 NOTE — Evaluation (Signed)
Physical Therapy Evaluation Patient Details Name: Angela Michael MRN: [id number] DOB: 15-Sep-1960 Today's Date: 03/03/2017   History of Present Illness  Angela Michael is a 56yo white female who comes to [facility] for abdominoplasty, panniculotomy, back lipectomy. AT baseline, pt is a fully independent community dwelling adult without functional limitations. PMH: Roux en Y, syncope, PVC, depression, chronic low back pain, HLD, anemia, lymphoma, seizures, and Left TKA (2017).   Clinical Impression  Pt admitted with above diagnosis. Pt currently with functional limitations due to the deficits listed below (see "PT Problem List"). Upon entry, the patient is received EOB mobilizing to bathroom with NA. The pt is awake and agreeable to participate. Pt Michael/o postoperative pain, and asks for pain meds at end of session. Functional mobility assessment demonstrates all basic mobility performed with modified independence , but slowly and cautiously d/t pain. HR remains elevated throughout, 130s BPM at start of session, but decreased to 120s BPM after ambulation in hallway. Patient is at baseline, all education completed, and time is given to address all questions/concerns. No additional skilled PT services needed at this time, PT signing off. PT recommends daily ambulation ad lib or with nursing staff as needed to prevent deconditioning.      Follow Up Recommendations No PT follow up    Equipment Recommendations  None recommended by PT    Recommendations for Other Services       Precautions / Restrictions Precautions Precautions: Fall Restrictions Weight Bearing Restrictions: No      Mobility  Bed Mobility               General bed mobility comments: received at EOB with nursing   Transfers Overall transfer level: Modified independent Equipment used: None             General transfer comment: slow and cautious d/t surgical pain   Ambulation/Gait Ambulation/Gait assistance:  Supervision Ambulation Distance (Feet): 300 Feet Assistive device: None Gait Pattern/deviations: Step-through pattern;WFL(Within Functional Limits)(left compensated trendelenburg ) Gait velocity: 0.64m/s    General Gait Details: PT managing IV pole, tachycardia 120s-130s; denies dizziness, lighteheadedness  Stairs            Wheelchair Mobility    Modified Rankin (Stroke Patients Only)       Balance Overall balance assessment: Modified Independent;No apparent balance deficits (not formally assessed)                                           Pertinent Vitals/Pain Pain Assessment: 0-10 Pain Score: 8  Pain Location: surgical site Pain Intervention(s): Limited activity within patient's tolerance;Patient requesting pain meds-RN notified;Monitored during session    Home Living Family/patient expects to be discharged to:: Private residence Living Arrangements: Spouse/significant other;Children Available Help at Discharge: Family Type of Home: House Home Access: Stairs to enter Entrance Stairs-Rails: None Entrance Stairs-Number of Steps: 1/2 step at threshold  Home Layout: One level Home Equipment: None      Prior Function Level of Independence: Independent               Hand Dominance        Extremity/Trunk Assessment   Upper Extremity Assessment Upper Extremity Assessment: Overall WFL for tasks assessed    Lower Extremity Assessment Lower Extremity Assessment: Overall WFL for tasks assessed    Cervical / Trunk Assessment Cervical / Trunk Assessment: Normal  Communication  Cognition Arousal/Alertness: Awake/alert Behavior During Therapy: WFL for tasks assessed/performed Overall Cognitive Status: Within Functional Limits for tasks assessed                                        General Comments      Exercises     Assessment/Plan    PT Assessment Patent does not need any further PT services  PT Problem  List         PT Treatment Interventions      PT Goals (Current goals can be found in the Care Plan section)  Acute Rehab PT Goals PT Goal Formulation: All assessment and education complete, DC therapy    Frequency     Barriers to discharge        Co-evaluation               AM-PAC PT "6 Clicks" Daily Activity  Outcome Measure Difficulty turning over in bed (including adjusting bedclothes, sheets and blankets)?: A Little Difficulty moving from lying on back to sitting on the side of the bed? : A Little Difficulty sitting down on and standing up from a chair with arms (e.g., wheelchair, bedside commode, etc,.)?: A Little Help needed moving to and from a bed to chair (including a wheelchair)?: A Little Help needed walking in hospital room?: None Help needed climbing 3-5 steps with a railing? : A Little 6 Click Score: 19    End of Session Equipment Utilized During Treatment: Gait belt Activity Tolerance: Patient tolerated treatment well;Patient limited by pain Patient left: in chair;with family/visitor present Nurse Communication: Patient requests pain meds PT Visit Diagnosis: Difficulty in walking, not elsewhere classified (R26.2)    Time: 6803-2122 PT Time Calculation (min) (ACUTE ONLY): 18 min   Charges:   PT Evaluation $PT Eval Low Complexity: 1 Low PT Treatments $Therapeutic Activity: 8-22 mins   PT G Codes:   PT G-Codes **NOT FOR INPATIENT CLASS** Functional Assessment Tool Used: AM-PAC 6 Clicks Basic Mobility;Clinical judgement Functional Limitation: Changing and maintaining body position Changing and Maintaining Body Position Current Status (Q8250): At least 20 percent but less than 40 percent impaired, limited or restricted Changing and Maintaining Body Position Goal Status (I3704): At least 20 percent but less than 40 percent impaired, limited or restricted Changing and Maintaining Body Position Discharge Status [phone]): At least 20 percent but less than  40 percent impaired, limited or restricted    2:34 PM, 03/03/17 Angela Michael, PT, DPT [facility]rap[facility] - [place] [phone] (Pager)  [phone] [facility])  [phone] (Office)     Angela Michael 03/03/2017, 2:31 PM

## 2017-03-03 NOTE — Progress Notes (Signed)
  POD #1 belt lipectomy  Temp:  [97.8 F (36.6 C)-99.2 F (37.3 C)] 98.6 F (37 C) (11/20 9450) Pulse Rate:  [74-110] 81 (11/20 0642) Resp:  [9-18] 18 (11/20 0642) BP: (104-130)/(62-86) 104/69 (11/20 0642) SpO2:  [90 %-95 %] 94 % (11/20 3888)   JP 15/30/27/35  Tolerating diet, reports pain "4" on duragesic patch (home med) and oxycodone Foley out this am Has not been out of bed   PE: drains serosanguinous Abdomen soft, incisions intact with some scant drainage right hip  A/P PT to help with ambulation- given circumferential incision needs to be able to take care to not bend to much forward. [employer] home tomorrow. Plan home Lovenox DVT prophylaxis as limited mobility with circumferential incision.  Irene Limbo, MD [facility] [phone], pin [phone]

## 2017-03-04 MED ORDER — ENOXAPARIN SODIUM 40 MG/0.4ML ~~LOC~~ SOLN: 40.0000 mg | SUBCUTANEOUS | 0 refills | Status: DC

## 2017-03-04 MED ORDER — OXYCODONE HCL 5 MG PO TABS: 5.0000 mg | ORAL_TABLET | ORAL | 0 refills | Status: DC | PRN

## 2017-03-04 NOTE — Progress Notes (Signed)
1400 Patient discharged to home in stable condition. Verbalizes understanding of all discharge instructions including incision/ JP drain care, discharge medications, and follow up MD visits. Patient accompanied by her spouse.

## 2017-03-04 NOTE — Discharge Summary (Signed)
Physician Discharge Summary  Patient ID: Shamera Yarberry MRN: [id number] DOB/AGE: 05-30-1960 56 y.o.  Admit date: 03/02/2017 Discharge date: 03/04/2017  Admission Diagnoses: Panniculitis,history massive weight loss  Discharge Diagnoses:  Active Problems:   Panniculitis  Discharged Condition: stable  Hospital Course: Postoperatively Foley discontinued POD#1 and patient able to void following this. She ambulated with PT and felt to be independent. She tolerated diet and pain controlled with home med Fentanyl patch with oxycodone supplement. Given limited mobility obesity and prior UE DVT plan prophylactic Lovenox for at least 1 week post operative, will review her mobility at that time and decide length treatment.  Treatments: surgery: abdominal and buttock lipectomy (belt lipectomy) 11.19.18  Discharge Exam: Blood pressure 111/61, pulse (!) 110, temperature 99.4 F (37.4 C), temperature source Oral, resp. rate 18, height 5' 1.75" (1.568 m), weight 86.2 kg (190 lb), SpO2 93 %. Incision/Wound: incisions intact with dry drainage dressing, JPs serosanguinous. One blister right thigh from binder and excoriation 1 cm right back similar reason  Disposition: 01-Home or Self Care  Discharge Instructions    Call MD for:  redness, tenderness, or signs of infection (pain, swelling, bleeding, redness, odor or green/yellow discharge around incision site)   Complete by:  As directed    Call MD for:  temperature >100.5   Complete by:  As directed    Discharge instructions   Complete by:  As directed    Ok to remove dressings and shower am 11.21.18. Soap and water ok, pat incisions dry. No creams or ointments over incisions. Do not let drains dangle in shower, attach to lanyard or similar.Strip and record drains twice daily and bring log to clinic visit.  Abdominal binder at other times for comfort, may remove if you note any blisters. Keep pillow beneath knees while in bed. Minimize bending  forward to 30 degrees.  No house yard work or exercise until cleared by MD.   Start home Lovenox use 11.22.18   Driving Restrictions   Complete by:  As directed    No driving for 2 weeks   Lifting restrictions   Complete by:  As directed    No lifting > 5 lbs   Resume previous diet   Complete by:  As directed      Allergies as of 03/04/2017      Reactions   Advicor [niacin-lovastatin Er] Rash   ALLERGY / INTOLERANCE      Medication List    TAKE these medications   acetaminophen 500 MG tablet Commonly known as:  TYLENOL Take 1,000 mg every 6 (six) hours as needed by mouth for moderate pain or headache.   aspirin 81 MG tablet Take 81 mg by mouth daily. Reported on 06/21/2015   atorvastatin 40 MG tablet Commonly known as:  LIPITOR Take 1 tablet (40 mg total) by mouth at bedtime.   cyanocobalamin 1000 MCG/ML injection Commonly known as:  (VITAMIN B-12) INJECT 1 ML (1,000 MCG TOTAL) INTO THE MUSCLE EVERY 30 (THIRTY) DAYS.   cyclobenzaprine 5 MG tablet Commonly known as:  FLEXERIL TAKE 1 TABLET BY MOUTH THREE TIMES A DAY AS NEEDED FOR MUSCLE SPASMS What changed:  See the new instructions.   docusate sodium 100 MG capsule Commonly known as:  COLACE Take 1 capsule (100 mg total) by mouth 2 (two) times daily. What changed:    when to take this  reasons to take this   enoxaparin 40 MG/0.4ML injection Commonly known as:  LOVENOX Inject 0.4 mLs (40 mg total) into  the skin daily. Start taking on:  03/05/2017   fentaNYL 50 MCG/HR Commonly known as:  DURAGESIC Place 1 patch (50 mcg total) onto the skin every 3 (three) days.   FLUoxetine 40 MG capsule Commonly known as:  PROZAC Take 2 capsules (80 mg total) by mouth daily.   fluticasone 50 MCG/ACT nasal spray Commonly known as:  FLONASE PLACE 2 SPRAYS INTO THE NOSE AS NEEDED. FOR ALLERGIES   furosemide 40 MG tablet Commonly known as:  LASIX TAKE 1 TABLET (40 MG TOTAL) BY MOUTH DAILY.   gabapentin 300 MG  capsule Commonly known as:  NEURONTIN TAKE 2 CAPSULE BY MOUTH TID What changed:    how much to take  how to take this  when to take this  additional instructions   INTEGRA 62.5-62.5-40-3 MG Caps Take 1 capsule by mouth daily. What changed:  when to take this   levETIRAcetam 500 MG tablet Commonly known as:  KEPPRA TAKE 2 TABLETS BY MOUTH 2 TIMES DAILY What changed:    how much to take  how to take this  when to take this  additional instructions   loratadine 10 MG tablet Commonly known as:  CLARITIN Take 10 mg daily as needed by mouth for allergies.   multivitamin with minerals Tabs tablet Take 1 tablet by mouth daily. Reported on 06/21/2015   nitroGLYCERIN 0.4 MG SL tablet Commonly known as:  NITROSTAT Place 1 tablet (0.4 mg total) under the tongue every 5 (five) minutes as needed for chest pain.   oxyCODONE 5 MG immediate release tablet Commonly known as:  Oxy IR/ROXICODONE Take 1-2 tablets (5-10 mg total) by mouth every 4 (four) hours as needed for moderate pain.   polyethylene glycol powder powder Commonly known as:  GLYCOLAX/MIRALAX Take 17 g by mouth daily as needed. What changed:  reasons to take this   potassium chloride SA 20 MEQ tablet Commonly known as:  K-DUR,KLOR-CON Take 1 tablet (20 mEq total) by mouth 2 (two) times daily.   traZODone 50 MG tablet Commonly known as:  DESYREL TAKE 1 TO 3 TABLETS AT BEDTIME What changed:    how much to take  how to take this  when to take this  additional instructions      Follow-up Information    Irene Limbo, MD Follow up in 1 week(s).   Specialty:  Plastic Surgery Why:  as scheduled Contact information: [address] [place] [address] [phone]           Signed: Irene Limbo 03/04/2017, 7:32 AM

## 2017-03-09 ENCOUNTER — Other Ambulatory Visit: Payer: Self-pay

## 2017-03-09 DIAGNOSIS — G5691 Unspecified mononeuropathy of right upper limb: Secondary | ICD-10-CM

## 2017-03-09 MED ORDER — FENTANYL 50 MCG/HR TD PT72: 50.0000 ug | MEDICATED_PATCH | TRANSDERMAL | 0 refills | Status: DC

## 2017-03-09 NOTE — Telephone Encounter (Signed)
Multiple attempts have been made to contact pt without a return call. Will close encounter.

## 2017-03-22 ENCOUNTER — Other Ambulatory Visit: Payer: Self-pay | Admitting: Nurse Practitioner

## 2017-03-24 ENCOUNTER — Ambulatory Visit: Payer: Medicaid Other | Admitting: Vascular Surgery

## 2017-03-26 ENCOUNTER — Other Ambulatory Visit: Payer: Medicaid Other | Admitting: Vascular Surgery

## 2017-04-02 ENCOUNTER — Ambulatory Visit: Payer: Medicaid Other | Admitting: Vascular Surgery

## 2017-04-02 ENCOUNTER — Encounter (HOSPITAL_COMMUNITY): Payer: Medicaid Other

## 2017-04-12 ENCOUNTER — Other Ambulatory Visit: Payer: Self-pay | Admitting: Nurse Practitioner

## 2017-04-16 ENCOUNTER — Other Ambulatory Visit: Payer: Medicaid Other | Admitting: Vascular Surgery

## 2017-04-23 ENCOUNTER — Ambulatory Visit: Payer: Medicaid Other | Admitting: Vascular Surgery

## 2017-04-23 ENCOUNTER — Encounter (HOSPITAL_COMMUNITY): Payer: Medicaid Other

## 2017-04-29 ENCOUNTER — Other Ambulatory Visit: Payer: Self-pay | Admitting: *Deleted

## 2017-04-29 DIAGNOSIS — D509 Iron deficiency anemia, unspecified: Secondary | ICD-10-CM

## 2017-04-29 MED ORDER — INTEGRA 62.5-62.5-40-3 MG PO CAPS: 1.0000 | ORAL_CAPSULE | Freq: Every day | ORAL | 1 refills | Status: DC

## 2017-05-08 ENCOUNTER — Telehealth: Payer: Self-pay | Admitting: Nurse Practitioner

## 2017-05-08 NOTE — Telephone Encounter (Signed)
Has to be seen to get pain meds- office policy

## 2017-05-08 NOTE — Telephone Encounter (Signed)
Apt made

## 2017-05-12 ENCOUNTER — Encounter: Payer: Self-pay | Admitting: Vascular Surgery

## 2017-05-12 ENCOUNTER — Encounter: Payer: Self-pay | Admitting: Nurse Practitioner

## 2017-05-12 ENCOUNTER — Ambulatory Visit (INDEPENDENT_AMBULATORY_CARE_PROVIDER_SITE_OTHER): Payer: Medicaid Other | Admitting: Vascular Surgery

## 2017-05-12 ENCOUNTER — Ambulatory Visit: Payer: Medicaid Other | Admitting: Nurse Practitioner

## 2017-05-12 VITALS — BP 118/83 | HR 69 | Temp 98.1°F | Resp 16 | Ht 61.75 in | Wt 183.0 lb

## 2017-05-12 VITALS — BP 108/76 | HR 69 | Temp 96.6°F | Ht 61.0 in | Wt 183.0 lb

## 2017-05-12 DIAGNOSIS — F32A Depression, unspecified: Secondary | ICD-10-CM

## 2017-05-12 DIAGNOSIS — E785 Hyperlipidemia, unspecified: Secondary | ICD-10-CM | POA: Diagnosis not present

## 2017-05-12 DIAGNOSIS — D696 Thrombocytopenia, unspecified: Secondary | ICD-10-CM

## 2017-05-12 DIAGNOSIS — G5691 Unspecified mononeuropathy of right upper limb: Secondary | ICD-10-CM | POA: Diagnosis not present

## 2017-05-12 DIAGNOSIS — R6510 Systemic inflammatory response syndrome (SIRS) of non-infectious origin without acute organ dysfunction: Secondary | ICD-10-CM | POA: Diagnosis not present

## 2017-05-12 DIAGNOSIS — R609 Edema, unspecified: Secondary | ICD-10-CM

## 2017-05-12 DIAGNOSIS — I83899 Varicose veins of unspecified lower extremities with other complications: Secondary | ICD-10-CM | POA: Diagnosis not present

## 2017-05-12 DIAGNOSIS — C8590 Non-Hodgkin lymphoma, unspecified, unspecified site: Secondary | ICD-10-CM

## 2017-05-12 DIAGNOSIS — G4700 Insomnia, unspecified: Secondary | ICD-10-CM | POA: Diagnosis not present

## 2017-05-12 DIAGNOSIS — G894 Chronic pain syndrome: Secondary | ICD-10-CM | POA: Diagnosis not present

## 2017-05-12 DIAGNOSIS — E876 Hypokalemia: Secondary | ICD-10-CM | POA: Diagnosis not present

## 2017-05-12 DIAGNOSIS — F329 Major depressive disorder, single episode, unspecified: Secondary | ICD-10-CM | POA: Diagnosis not present

## 2017-05-12 DIAGNOSIS — R569 Unspecified convulsions: Secondary | ICD-10-CM

## 2017-05-12 DIAGNOSIS — F5101 Primary insomnia: Secondary | ICD-10-CM

## 2017-05-12 DIAGNOSIS — F3342 Major depressive disorder, recurrent, in full remission: Secondary | ICD-10-CM

## 2017-05-12 MED ORDER — GABAPENTIN 300 MG PO CAPS: ORAL_CAPSULE | ORAL | 5 refills | Status: DC

## 2017-05-12 MED ORDER — LEVETIRACETAM 500 MG PO TABS: ORAL_TABLET | ORAL | 5 refills | Status: DC

## 2017-05-12 MED ORDER — ATORVASTATIN CALCIUM 40 MG PO TABS: 40.0000 mg | ORAL_TABLET | Freq: Every day | ORAL | 5 refills | Status: DC

## 2017-05-12 MED ORDER — FLUOXETINE HCL 40 MG PO CAPS: 80.0000 mg | ORAL_CAPSULE | Freq: Every day | ORAL | 5 refills | Status: DC

## 2017-05-12 MED ORDER — FENTANYL 50 MCG/HR TD PT72: 50.0000 ug | MEDICATED_PATCH | TRANSDERMAL | 0 refills | Status: DC

## 2017-05-12 MED ORDER — TRAZODONE HCL 50 MG PO TABS: ORAL_TABLET | ORAL | 1 refills | Status: DC

## 2017-05-12 MED ORDER — POTASSIUM CHLORIDE CRYS ER 20 MEQ PO TBCR: 20.0000 meq | EXTENDED_RELEASE_TABLET | Freq: Two times a day (BID) | ORAL | 5 refills | Status: DC

## 2017-05-12 MED ORDER — FUROSEMIDE 40 MG PO TABS: ORAL_TABLET | ORAL | 5 refills | Status: DC

## 2017-05-12 NOTE — Progress Notes (Signed)
[facility] [place]  Patient name: Angela Michael MRN: [id number] DOB: 10/24/1960 Sex: female  REASON FOR VISIT: Continued discussion of left leg venous hypertension  HPI: Angela Michael is a 57 y.o. female here today for follow-up.  She continues to have discomfort in her left posterior calf over her saphenous vein distribution and also with tributary varicosities.  She has been compliant with her compression and elevation.  He reports that the discomfort is similar to what she had had in her right leg prior to the right small saphenous ablation several years ago  Past Medical History:  Diagnosis Date  . Anemia   . CAD (coronary artery disease)    RCA 50%, LAD 30% cath 2017.   Marland Kitchen Chronic back pain   . Chronic neck pain   . Chronic pain   . Depression   . Headache   . Heart murmur   . History of syncope   . Hyperlipidemia   . 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 Neuropathy   . Panniculitis   . Peripheral edema   . PVC's (premature ventricular contractions)   . Seizures ([place])   . Sleep apnea    no longer needed it after weight loss  . SVT (supraventricular tachycardia) ([place])   . Varicose veins of both lower extremities   . Wears glasses     Family History  Problem Relation Age of Onset  . Liver cancer Father   . Diabetes Father   . Kidney cancer Father   . Heart failure Brother   . Heart attack Brother   . Tuberculosis Maternal Grandmother   . Bone cancer Maternal Grandfather   . Diabetes Paternal Uncle   . Lung cancer Mother   . Coronary artery disease Neg Hx   . Sudden death Neg Hx   . Cardiomyopathy Neg Hx   . Esophageal cancer Neg Hx   . Stomach cancer Neg Hx     SOCIAL HISTORY: Social History   Tobacco Use  . Smoking status: Never Smoker  . Smokeless tobacco: Never Used  Substance Use Topics  . Alcohol use: Yes    Alcohol/week: 0.0 oz    Comment: rare    Allergies    Allergen Reactions  . Advicor [Niacin-Lovastatin Er] Rash    ALLERGY / INTOLERANCE    Current Outpatient Medications  Medication Sig Dispense Refill  . acetaminophen (TYLENOL) 500 MG tablet Take 1,000 mg every 6 (six) hours as needed by mouth for moderate pain or headache.    Marland Kitchen aspirin 81 MG tablet Take 81 mg by mouth daily. Reported on 06/21/2015    . atorvastatin (LIPITOR) 40 MG tablet Take 1 tablet (40 mg total) by mouth at bedtime. 30 tablet 5  . cyanocobalamin (,VITAMIN B-12,) 1000 MCG/ML injection INJECT 1 ML (1,000 MCG TOTAL) INTO THE MUSCLE EVERY 30 (THIRTY) DAYS. 30 mL 0  . cyclobenzaprine (FLEXERIL) 5 MG tablet TAKE 1 TABLET BY MOUTH THREE TIMES A DAY AS NEEDED FOR MUSCLE SPASMS 90 tablet 0  . docusate sodium (COLACE) 100 MG capsule Take 1 capsule (100 mg total) by mouth 2 (two) times daily. (Patient taking differently: Take 100 mg 2 (two) times daily as needed by mouth for mild constipation. ) 60 capsule 3  . enoxaparin (LOVENOX) 40 MG/0.4ML injection Inject 0.4 mLs (40 mg total) into the skin daily. 5 Syringe 0  . Fe Fum-FePoly-Vit C-Vit B3 (INTEGRA) 62.5-62.5-40-3 MG CAPS Take 1  capsule by mouth daily. 90 capsule 1  . fentaNYL (DURAGESIC) 50 MCG/HR Place 1 patch (50 mcg total) onto the skin every 3 (three) days. 10 patch 0  . fentaNYL (DURAGESIC) 50 MCG/HR Place 1 patch (50 mcg total) onto the skin every 3 (three) days. 10 patch 0  . FLUoxetine (PROZAC) 40 MG capsule Take 2 capsules (80 mg total) by mouth daily. 60 capsule 5  . fluticasone (FLONASE) 50 MCG/ACT nasal spray PLACE 2 SPRAYS INTO THE NOSE AS NEEDED. FOR ALLERGIES 16 g 3  . furosemide (LASIX) 40 MG tablet TAKE 1 TABLET (40 MG TOTAL) BY MOUTH DAILY. 30 tablet 5  . gabapentin (NEURONTIN) 300 MG capsule TAKE 2 CAPSULE BY MOUTH TID (Patient taking differently: Take 600 mg 3 (three) times daily by mouth. ) 180 capsule 5  . levETIRAcetam (KEPPRA) 500 MG tablet TAKE 2 TABLETS BY MOUTH 2 TIMES DAILY (Patient taking differently:  Take 1,000 mg 2 (two) times daily by mouth. ) 120 tablet 5  . loratadine (CLARITIN) 10 MG tablet Take 10 mg daily as needed by mouth for allergies.     . Multiple Vitamin (MULTIVITAMIN WITH MINERALS) TABS Take 1 tablet by mouth daily. Reported on 06/21/2015    . nitroGLYCERIN (NITROSTAT) 0.4 MG SL tablet Place 1 tablet (0.4 mg total) under the tongue every 5 (five) minutes as needed for chest pain. 25 tablet 3  . oxyCODONE (OXY IR/ROXICODONE) 5 MG immediate release tablet Take 1-2 tablets (5-10 mg total) by mouth every 4 (four) hours as needed for moderate pain. 40 tablet 0  . polyethylene glycol powder (GLYCOLAX/MIRALAX) powder Take 17 g by mouth daily as needed. (Patient taking differently: Take 17 g daily as needed by mouth for moderate constipation. ) 3350 g 1  . potassium chloride SA (K-DUR,KLOR-CON) 20 MEQ tablet Take 1 tablet (20 mEq total) by mouth 2 (two) times daily. 30 tablet 5  . traZODone (DESYREL) 50 MG tablet TAKE 1 TO 3 TABLETS AT BEDTIME (Patient taking differently: Take 150 mg at bedtime by mouth. ) 270 tablet 1   No current facility-administered medications for this visit.     REVIEW OF SYSTEMS:  [X]  denotes positive finding, [ ]  denotes negative finding Cardiac  Comments:  Chest pain or chest pressure:    Shortness of breath upon exertion:    Short of breath when lying flat:    Irregular heart rhythm:        Vascular    Pain in calf, thigh, or hip brought on by ambulation:    Pain in feet at night that wakes you up from your sleep:     Blood clot in your veins:    Leg swelling:  x         PHYSICAL EXAM: Vitals:   05/12/17 0906  BP: 118/83  Pulse: 69  Resp: 16  Temp: 98.1 F (36.7 C)  SpO2: 98%  Weight: 183 lb (83 kg)  Height: 5' 1.75" (1.568 m)    GENERAL: The patient is a well-nourished female, in no acute distress. The vital signs are documented above. CARDIOVASCULAR: Palpable dorsalis pedis pulse on the left.  Palpable small saphenous vein which is  dilated and mildly tender.  No evidence of thrombophlebitis.  She does have tributary about varicosities arising from this PULMONARY: There is good air exchange  MUSCULOSKELETAL: There are no major deformities or cyanosis. NEUROLOGIC: No focal weakness or paresthesias are detected. SKIN: There are no ulcers or rashes noted. PSYCHIATRIC: The patient has a  normal affect.  DATA:  I reimaged her small saphenous vein.  This does show distention and her formal duplex showed reflux.  MEDICAL ISSUES: Discussed these findings with the patient and her husband present.  Have recommended laser ablation of her left small saphenous vein and stab phlebectomy of tributary varicosities, 10-20.  Explained the procedure and potential thromboembolic complications.  She does have a history of prior PE and had a history of a caval filter placement in the past.  She wishes to proceed scheduled on 06/11/2017    Rosetta Posner, MD [facility] Tel [phone] Pager [phone]

## 2017-05-12 NOTE — Patient Instructions (Signed)

## 2017-05-12 NOTE — Progress Notes (Signed)
Subjective:    Patient ID: Angela Michael, female    DOB: 11-05-1960, 57 y.o.   MRN: [id number]  HPI  Angela Michael is here today for follow up of chronic medical problem.  Outpatient Encounter Medications as of 05/12/2017  Medication Sig  . acetaminophen (TYLENOL) 500 MG tablet Take 1,000 mg every 6 (six) hours as needed by mouth for moderate pain or headache.  Marland Kitchen aspirin 81 MG tablet Take 81 mg by mouth daily. Reported on 06/21/2015  . atorvastatin (LIPITOR) 40 MG tablet Take 1 tablet (40 mg total) by mouth at bedtime.  . cyanocobalamin (,VITAMIN B-12,) 1000 MCG/ML injection INJECT 1 ML (1,000 MCG TOTAL) INTO THE MUSCLE EVERY 30 (THIRTY) DAYS.  Marland Kitchen cyclobenzaprine (FLEXERIL) 5 MG tablet TAKE 1 TABLET BY MOUTH THREE TIMES A DAY AS NEEDED FOR MUSCLE SPASMS  . docusate sodium (COLACE) 100 MG capsule Take 1 capsule (100 mg total) by mouth 2 (two) times daily. (Patient taking differently: Take 100 mg 2 (two) times daily as needed by mouth for mild constipation. )  . enoxaparin (LOVENOX) 40 MG/0.4ML injection Inject 0.4 mLs (40 mg total) into the skin daily.  . Fe Fum-FePoly-Vit C-Vit B3 (INTEGRA) 62.5-62.5-40-3 MG CAPS Take 1 capsule by mouth daily.  . fentaNYL (DURAGESIC) 50 MCG/HR Place 1 patch (50 mcg total) onto the skin every 3 (three) days.  . fentaNYL (DURAGESIC) 50 MCG/HR Place 1 patch (50 mcg total) onto the skin every 3 (three) days.  Marland Kitchen FLUoxetine (PROZAC) 40 MG capsule Take 2 capsules (80 mg total) by mouth daily.  . fluticasone (FLONASE) 50 MCG/ACT nasal spray PLACE 2 SPRAYS INTO THE NOSE AS NEEDED. FOR ALLERGIES  . furosemide (LASIX) 40 MG tablet TAKE 1 TABLET (40 MG TOTAL) BY MOUTH DAILY.  Marland Kitchen gabapentin (NEURONTIN) 300 MG capsule TAKE 2 CAPSULE BY MOUTH TID (Patient taking differently: Take 600 mg 3 (three) times daily by mouth. )  . levETIRAcetam (KEPPRA) 500 MG tablet TAKE 2 TABLETS BY MOUTH 2 TIMES DAILY (Patient taking differently: Take 1,000 mg 2 (two) times daily by mouth. )    . loratadine (CLARITIN) 10 MG tablet Take 10 mg daily as needed by mouth for allergies.   . Multiple Vitamin (MULTIVITAMIN WITH MINERALS) TABS Take 1 tablet by mouth daily. Reported on 06/21/2015  . nitroGLYCERIN (NITROSTAT) 0.4 MG SL tablet Place 1 tablet (0.4 mg total) under the tongue every 5 (five) minutes as needed for chest pain.  Marland Kitchen oxyCODONE (OXY IR/ROXICODONE) 5 MG immediate release tablet Take 1-2 tablets (5-10 mg total) by mouth every 4 (four) hours as needed for moderate pain.  . polyethylene glycol powder (GLYCOLAX/MIRALAX) powder Take 17 g by mouth daily as needed. (Patient taking differently: Take 17 g daily as needed by mouth for moderate constipation. )  . potassium chloride SA (K-DUR,KLOR-CON) 20 MEQ tablet Take 1 tablet (20 mEq total) by mouth 2 (two) times daily.  . traZODone (DESYREL) 50 MG tablet TAKE 1 TO 3 TABLETS AT BEDTIME (Patient taking differently: Take 150 mg at bedtime by mouth. )     1. Neuropathy of right upper extremity  has had for many years- has been on fentanyl patches for quite some time  2. Chronic pain syndrome sya that her pain is all over. Fentanyl patch helps. She use to be on oxycodone but we stopped because she had episode last year where she over dosed  3. Depression, unspecified depression type  She has been on prozac for some tome now- is doing well.  Has occasional flare ups but is doing well currently  4. Hyperlipidemia with target LDL less than 100  Does not really watch diet- no exercise  5. Hypokalemia  No c/o lower ext cramping  6. Morbid obesity ([place])  Has lost some weight since last visit  7. Seizures ([place])  No recent seizure activity. Has not seen neurology recently  8. Non-Hodgkin's lymphoma, unspecified body region, unspecified non-Hodgkin lymphoma type [facility]) Still seeing hematology. Last visit was 04/28/17. Has completed all treatments  9. Insomnia, unspecified type  Uses trazadone to sleep. Works most of the time  10. SIRS (systemic  inflammatory response syndrome) ([place])  Says she just hurts all over  11. Thrombocytopenia ([place]) is followed by hematology  12. Peripheral edema  Edema will usually resolve if props leg sup when sitting    New complaints: None today  Social history: Has had some cosmetic surgery to remove excess skin frm abdomen and thighs. She has also had injections done to varicose veins. Says that she is doing well from surgery    Review of Systems  Constitutional: Negative for activity change and appetite change.  HENT: Negative.   Eyes: Negative for pain.  Respiratory: Negative for shortness of breath.   Cardiovascular: Positive for leg swelling. Negative for chest pain and palpitations.  Gastrointestinal: Negative for abdominal pain.  Endocrine: Negative for polydipsia.  Genitourinary: Negative.   Musculoskeletal: Positive for arthralgias and back pain.  Skin: Negative for rash.  Neurological: Negative for dizziness, weakness and headaches.  Hematological: Does not bruise/bleed easily.  Psychiatric/Behavioral: Negative.   All other systems reviewed and are negative.      Objective:   Physical Exam  Constitutional: She is oriented to person, place, and time. She appears well-developed and well-nourished.  HENT:  Nose: Nose normal.  Mouth/Throat: Oropharynx is clear and moist.  Eyes: EOM are normal.  Neck: Trachea normal, normal range of motion and full passive range of motion without pain. Neck supple. No JVD present. Carotid bruit is not present. No thyromegaly present.  Cardiovascular: Normal rate, regular rhythm, normal heart sounds and intact distal pulses. Exam reveals no gallop and no friction rub.  No murmur heard. Pulmonary/Chest: Effort normal and breath sounds normal.  Abdominal: Soft. Bowel sounds are normal. She exhibits no distension and no mass. There is no tenderness.  Musculoskeletal: Normal range of motion.  Lymphadenopathy:    She has no cervical adenopathy.    Neurological: She is alert and oriented to person, place, and time. She has normal reflexes.  Skin: Skin is warm and dry.  Psychiatric: She has a normal mood and affect. Her behavior is normal. Judgment and thought content normal.   BP 108/76   Pulse 69   Temp (!) 96.6 F (35.9 C) (Oral)   Ht 5\' 1"  (1.549 m)   Wt 183 lb (83 kg)   BMI 34.58 kg/m       Assessment & Plan:  1. Neuropathy of right upper extremity  2. Chronic pain syndrome  3. Depression, unspecified depression type Stress management  4. Hyperlipidemia with target LDL less than 100 Low fat diet - atorvastatin (LIPITOR) 40 MG tablet; Take 1 tablet (40 mg total) by mouth at bedtime.  Dispense: 30 tablet; Refill: 5  5. Hypokalemia - potassium chloride SA (K-DUR,KLOR-CON) 20 MEQ tablet; Take 1 tablet (20 mEq total) by mouth 2 (two) times daily.  Dispense: 30 tablet; Refill: 5  6. Morbid obesity ([place]) Mary-Margaret Hassell Done, FNP  7. Seizures ([place]) Keep follow  Up with neurology - levETIRAcetam (KEPPRA) 500 MG tablet; TAKE 2 TABLETS BY MOUTH 2 TIMES DAILY  Dispense: 120 tablet; Refill: 5  8. Non-Hodgkin's lymphoma, unspecified body region, unspecified non-Hodgkin lymphoma type [facility]) Keep follow up with oncology  9. Insomnia, unspecified type Bedtime routine  10. SIRS (systemic inflammatory response syndrome) ([facility])  11. Thrombocytopenia ([place])  12. Peripheral edema Elevate legs when sitting - furosemide (LASIX) 40 MG tablet; TAKE 1 TABLET (40 MG TOTAL) BY MOUTH DAILY.  Dispense: 30 tablet; Refill: 5  13. Neuropathy of upper extremity, right - fentaNYL (DURAGESIC) 50 MCG/HR; Place 1 patch (50 mcg total) onto the skin every 3 (three) days.  Dispense: 10 patch; Refill: 0 - fentaNYL (DURAGESIC) 50 MCG/HR; Place 1 patch (50 mcg total) onto the skin every 3 (three) days.  Dispense: 10 patch; Refill: 0 - gabapentin (NEURONTIN) 300 MG capsule; TAKE 2 CAPSULE BY MOUTH TID  Dispense: 180 capsule; Refill: 5 - fentaNYL  (DURAGESIC) 50 MCG/HR; Place 1 patch (50 mcg total) onto the skin every 3 (three) days.  Dispense: 10 patch; Refill: 0  14. Primary insomnia - traZODone (DESYREL) 50 MG tablet; TAKE 1 TO 3 TABLETS AT BEDTIME  Dispense: 270 tablet; Refill: 1  15. Recurrent major depressive disorder, in full remission (H[facility]) - FLUoxetine (PROZAC) 40 MG capsule; Take 2 capsules (80 mg total) by mouth daily.  Dispense: 60 capsule; Refill: 5    Labs pending Health maintenance reviewed Diet and exercise encouraged Continue all meds Follow up  In 3 months   [place], FNP

## 2017-05-12 NOTE — Addendum Note (Signed)
Addended by: Chevis Pretty on: 05/12/2017 03:27 PM   Modules accepted: Orders

## 2017-05-13 LAB — LIPID PANEL
CHOLESTEROL TOTAL: 135 mg/dL (ref 100–199)
Chol/HDL Ratio: 2.4 ratio (ref 0.0–4.4)
HDL: 56 mg/dL (ref >39)
LDL Calculated: 62 mg/dL (ref 0–99)
TRIGLYCERIDES: 86 mg/dL (ref 0–149)
VLDL Cholesterol Cal: 17 mg/dL (ref 5–40)

## 2017-05-13 LAB — CMP14+EGFR
ALBUMIN: 3.8 g/dL (ref 3.5–5.5)
ALK PHOS: 128 IU/L — AB (ref 39–117)
ALT: 37 IU/L — ABNORMAL HIGH (ref 0–32)
AST: 32 IU/L (ref 0–40)
Albumin/Globulin Ratio: 1.7 (ref 1.2–2.2)
BILIRUBIN TOTAL: 0.6 mg/dL (ref 0.0–1.2)
BUN/Creatinine Ratio: 19 (ref 9–23)
BUN: 15 mg/dL (ref 6–24)
CHLORIDE: 104 mmol/L (ref 96–106)
CO2: 27 mmol/L (ref 20–29)
CREATININE: 0.79 mg/dL (ref 0.57–1.00)
Calcium: 8.8 mg/dL (ref 8.7–10.2)
GFR calc Af Amer: 98 mL/min/{1.73_m2} (ref >59)
GFR calc non Af Amer: 85 mL/min/{1.73_m2} (ref >59)
Globulin, Total: 2.3 g/dL (ref 1.5–4.5)
Glucose: 112 mg/dL — ABNORMAL HIGH (ref 65–99)
POTASSIUM: 4.5 mmol/L (ref 3.5–5.2)
SODIUM: 142 mmol/L (ref 134–144)
Total Protein: 6.1 g/dL (ref 6.0–8.5)

## 2017-05-15 ENCOUNTER — Ambulatory Visit: Payer: Medicaid Other | Admitting: Nurse Practitioner

## 2017-05-19 ENCOUNTER — Ambulatory Visit: Payer: Medicaid Other | Admitting: Physician Assistant

## 2017-05-19 ENCOUNTER — Other Ambulatory Visit: Payer: Self-pay | Admitting: Nurse Practitioner

## 2017-05-19 ENCOUNTER — Encounter: Payer: Self-pay | Admitting: Physician Assistant

## 2017-05-19 VITALS — BP 106/65 | HR 66 | Temp 101.1°F | Ht 61.0 in | Wt 192.6 lb

## 2017-05-19 DIAGNOSIS — J189 Pneumonia, unspecified organism: Secondary | ICD-10-CM

## 2017-05-19 DIAGNOSIS — J101 Influenza due to other identified influenza virus with other respiratory manifestations: Secondary | ICD-10-CM

## 2017-05-19 DIAGNOSIS — R509 Fever, unspecified: Secondary | ICD-10-CM

## 2017-05-19 LAB — VERITOR FLU A/B WAIVED
INFLUENZA A: POSITIVE — AB
INFLUENZA B: NEGATIVE

## 2017-05-19 MED ORDER — OSELTAMIVIR PHOSPHATE 75 MG PO CAPS: 75.0000 mg | ORAL_CAPSULE | Freq: Two times a day (BID) | ORAL | 0 refills | Status: DC

## 2017-05-19 MED ORDER — CEFDINIR 300 MG PO CAPS: 300.0000 mg | ORAL_CAPSULE | Freq: Two times a day (BID) | ORAL | 0 refills | Status: DC

## 2017-05-19 NOTE — Progress Notes (Signed)
BP 106/65   Pulse 66   Temp (!) 101.1 F (38.4 C) (Oral)   Ht 5\' 1"  (1.549 m)   Wt 192 lb 9.6 oz (87.4 kg)   BMI 36.39 kg/m    Subjective:    Patient ID: Angela Michael, female    DOB: [birth year], 57 y.o.   MRN: [id number]  HPI: Angela Michael is a 57 y.o. female presenting on 05/19/2017 for Cough; Fever; Wheezing; and Hoarse  This patient has had less than 2 days severe fever, chills, myalgias.  Complains of sinus headache and postnasal drainage. There is copious drainage at times. Associated sore throat, decreased appetite and headache.  Has been exposed to influenza.   Patient does get bronchitis and pneumonia.  She has had the flu in the past she has.  She does have a very bad productive cough at this time.  It has been green and had some blood tingeing at times.  Past Medical History:  Diagnosis Date  . Anemia   . CAD (coronary artery disease)    RCA 50%, LAD 30% cath 2017.   Marland Kitchen Chronic back pain   . Chronic neck pain   . Chronic pain   . Depression   . Headache   . Heart murmur   . History of syncope   . Hyperlipidemia   . Lymphoma ([place])    Stage 2 - year 3 of remission  . Neuropathy   . Panniculitis   . Peripheral edema   . PVC's (premature ventricular contractions)   . Seizures ([place])   . Sleep apnea    no longer needed it after weight loss  . SVT (supraventricular tachycardia) ([place])   . Varicose veins of both lower extremities   . Wears glasses    Relevant past medical, surgical, family and social history reviewed and updated as indicated. Interim medical history since our last visit reviewed. Allergies and medications reviewed and updated. DATA REVIEWED: CHART IN EPIC  Family History reviewed for pertinent findings.  Review of Systems  Constitutional: Positive for appetite change, chills, fatigue and fever. Negative for activity change.  HENT: Positive for congestion, postnasal drip and sore throat.   Eyes: Negative.   Respiratory: Positive for  cough, shortness of breath and wheezing.   Cardiovascular: Negative.  Negative for chest pain, palpitations and leg swelling.  Gastrointestinal: Negative.   Genitourinary: Negative.   Musculoskeletal: Positive for myalgias.  Skin: Negative.   Neurological: Positive for headaches.    Allergies as of 05/19/2017      Reactions   Advicor [niacin-lovastatin Er] Rash   ALLERGY / INTOLERANCE      Medication List        Accurate as of 05/19/17  5:13 PM. Always use your most recent med list.          acetaminophen 500 MG tablet Commonly known as:  TYLENOL Take 1,000 mg every 6 (six) hours as needed by mouth for moderate pain or headache.   aspirin 81 MG tablet Take 81 mg by mouth daily. Reported on 06/21/2015   atorvastatin 40 MG tablet Commonly known as:  LIPITOR Take 1 tablet (40 mg total) by mouth at bedtime.   cefdinir 300 MG capsule Commonly known as:  OMNICEF Take 1 capsule (300 mg total) by mouth 2 (two) times daily. 1 po BID   cyanocobalamin 1000 MCG/ML injection Commonly known as:  (VITAMIN B-12) INJECT 1 ML (1,000 MCG TOTAL) INTO THE MUSCLE EVERY 30 (THIRTY) DAYS.   cyclobenzaprine 5 MG  tablet Commonly known as:  FLEXERIL TAKE 1 TABLET BY MOUTH THREE TIMES A DAY AS NEEDED FOR MUSCLE SPASMS   docusate sodium 100 MG capsule Commonly known as:  COLACE Take 1 capsule (100 mg total) by mouth 2 (two) times daily.   enoxaparin 40 MG/0.4ML injection Commonly known as:  LOVENOX Inject 0.4 mLs (40 mg total) into the skin daily.   fentaNYL 50 MCG/HR Commonly known as:  DURAGESIC Place 1 patch (50 mcg total) onto the skin every 3 (three) days.   fentaNYL 50 MCG/HR Commonly known as:  DURAGESIC Place 1 patch (50 mcg total) onto the skin every 3 (three) days.   fentaNYL 50 MCG/HR Commonly known as:  DURAGESIC Place 1 patch (50 mcg total) onto the skin every 3 (three) days.   FLUoxetine 40 MG capsule Commonly known as:  PROZAC Take 2 capsules (80 mg total) by mouth  daily.   fluticasone 50 MCG/ACT nasal spray Commonly known as:  FLONASE PLACE 2 SPRAYS INTO THE NOSE AS NEEDED. FOR ALLERGIES   furosemide 40 MG tablet Commonly known as:  LASIX TAKE 1 TABLET (40 MG TOTAL) BY MOUTH DAILY.   gabapentin 300 MG capsule Commonly known as:  NEURONTIN TAKE 2 CAPSULE BY MOUTH TID   INTEGRA 62.5-62.5-40-3 MG Caps Take 1 capsule by mouth daily.   levETIRAcetam 500 MG tablet Commonly known as:  KEPPRA TAKE 2 TABLETS BY MOUTH 2 TIMES DAILY   loratadine 10 MG tablet Commonly known as:  CLARITIN Take 10 mg daily as needed by mouth for allergies.   multivitamin with minerals Tabs tablet Take 1 tablet by mouth daily. Reported on 06/21/2015   nitroGLYCERIN 0.4 MG SL tablet Commonly known as:  NITROSTAT Place 1 tablet (0.4 mg total) under the tongue every 5 (five) minutes as needed for chest pain.   oseltamivir 75 MG capsule Commonly known as:  TAMIFLU Take 1 capsule (75 mg total) by mouth 2 (two) times daily.   polyethylene glycol powder powder Commonly known as:  GLYCOLAX/MIRALAX Take 17 g by mouth daily as needed.   potassium chloride SA 20 MEQ tablet Commonly known as:  K-DUR,KLOR-CON Take 1 tablet (20 mEq total) by mouth 2 (two) times daily.   traZODone 50 MG tablet Commonly known as:  DESYREL TAKE 1 TO 3 TABLETS AT BEDTIME          Objective:    BP 106/65   Pulse 66   Temp (!) 101.1 F (38.4 C) (Oral)   Ht 5\' 1"  (1.549 m)   Wt 192 lb 9.6 oz (87.4 kg)   BMI 36.39 kg/m   Allergies  Allergen Reactions  . Advicor [Niacin-Lovastatin Er] Rash    ALLERGY / INTOLERANCE    Wt Readings from Last 3 Encounters:  05/19/17 192 lb 9.6 oz (87.4 kg)  05/12/17 183 lb (83 kg)  05/12/17 183 lb (83 kg)    Physical Exam  Constitutional: She is oriented to person, place, and time. She appears well-developed and well-nourished. No distress.  HENT:  Head: Normocephalic and atraumatic.  Right Ear: Tympanic membrane normal.  Left Ear: Tympanic  membrane normal.  Nose: Mucosal edema and rhinorrhea present. Right sinus exhibits no frontal sinus tenderness. Left sinus exhibits no frontal sinus tenderness.  Mouth/Throat: Posterior oropharyngeal erythema present. No oropharyngeal exudate or tonsillar abscesses.  Eyes: Conjunctivae and EOM are normal. Pupils are equal, round, and reactive to light.  Neck: Normal range of motion.  Cardiovascular: Normal rate, regular rhythm, normal heart sounds, intact distal  pulses and normal pulses.  Pulmonary/Chest: Effort normal. No respiratory distress. She has no decreased breath sounds. She has wheezes.  Abdominal: Soft. Bowel sounds are normal.  Neurological: She is alert and oriented to person, place, and time. She has normal reflexes.  Skin: Skin is warm and dry. No rash noted.  Psychiatric: She has a normal mood and affect. Her behavior is normal. Judgment and thought content normal.  Nursing note and vitals reviewed.       Assessment & Plan:   1. Fever, unspecified fever cause - Veritor Flu A/B Waived  2. Influenza A - oseltamivir (TAMIFLU) 75 MG capsule; Take 1 capsule (75 mg total) by mouth 2 (two) times daily.  Dispense: 10 capsule; Refill: 0  3. Pneumonia due to infectious organism, unspecified laterality, unspecified part of lung - cefdinir (OMNICEF) 300 MG capsule; Take 1 capsule (300 mg total) by mouth 2 (two) times daily. 1 po BID  Dispense: 20 capsule; Refill: 0   Continue all other maintenance medications as listed above.  Follow up plan: Return if symptoms worsen or fail to improve.  Educational handout given for [place] PA-C [facility] [address]  [place], [place] [address] [phone]   05/19/2017, 5:13 PM

## 2017-05-19 NOTE — Patient Instructions (Signed)

## 2017-06-11 ENCOUNTER — Ambulatory Visit: Payer: Medicaid Other | Admitting: Vascular Surgery

## 2017-06-11 ENCOUNTER — Encounter: Payer: Self-pay | Admitting: Vascular Surgery

## 2017-06-11 VITALS — BP 100/81 | HR 68 | Temp 98.0°F | Resp 16 | Ht 61.75 in | Wt 189.0 lb

## 2017-06-11 DIAGNOSIS — I83899 Varicose veins of unspecified lower extremities with other complications: Secondary | ICD-10-CM | POA: Diagnosis not present

## 2017-06-11 NOTE — Progress Notes (Signed)
     Laser Ablation Procedure    Date: 06/11/2017   Angela Michael DOB:June 29, 1960  Consent signed: Yes    Surgeon:  Dr. Sherren Mocha [name]  Procedure: Laser Ablation: left Small Saphenous Vein  BP 100/81   Pulse 68   Temp 98 F (36.7 C)   Resp 16   Ht 5' 1.75" (1.568 m)   Wt 189 lb (85.7 kg)   SpO2 98%   BMI 34.85 kg/m   Tumescent Anesthesia: 325 cc 0.9% NaCl with 50 cc Lidocaine HCL with 1% Epi and 15 cc 8.4% NaHCO3  Local Anesthesia: 3 cc Lidocaine HCL and NaHCO3 (ratio 2:1)  15 watts continuous mode        Total energy: 1401   Total time: 1:31    Stab Phlebectomy: <10 Sites: Calf at no charge  Patient tolerated procedure well  Notes:   Description of Procedure:  After marking the course of the secondary varicosities, the patient was placed on the operating table in the prone position, and the left leg was prepped and draped in sterile fashion.   Local anesthetic was administered and under ultrasound guidance the saphenous vein was accessed with a micro needle and guide wire; then the mirco puncture sheath was placed.  A guide wire was inserted saphenopopliteal junction , followed by a 5 french sheath.  The position of the sheath and then the laser fiber below the junction was confirmed using the ultrasound.  Tumescent anesthesia was administered along the course of the saphenous vein using ultrasound guidance. The patient was placed in Trendelenburg position and protective laser glasses were placed on patient and staff, and the laser was fired at 15 watts continuous mode advancing 1-7mm/second for a total of 1401 joules.   For stab phlebectomies, local anesthetic was administered at the previously marked varicosities, and tumescent anesthesia was administered around the vessels.  Ten to 20 stab wounds were made using the tip of an 11 blade. And using the vein hook, the phlebectomies were performed using a hemostat to avulse the varicosities.  Adequate hemostasis was achieved.      Steri strips were applied to the stab wounds and ABD pads and thigh high compression stockings were applied.  Ace wrap bandages were applied over the phlebectomy sites and at the top of the saphenopopliteal junction. Blood loss was less than 15 cc.  The patient ambulated out of the operating room having tolerated the procedure well.  Uneventful ablation of small saphenous vein.  Stab phlebectomy of several small tributary varicosities

## 2017-06-15 ENCOUNTER — Other Ambulatory Visit: Payer: Self-pay | Admitting: Nurse Practitioner

## 2017-06-18 ENCOUNTER — Ambulatory Visit: Payer: Medicaid Other | Admitting: Vascular Surgery

## 2017-06-18 ENCOUNTER — Encounter (HOSPITAL_COMMUNITY): Payer: Medicaid Other

## 2017-06-25 ENCOUNTER — Encounter (HOSPITAL_COMMUNITY): Payer: Medicaid Other

## 2017-07-01 ENCOUNTER — Telehealth: Payer: Self-pay | Admitting: Nurse Practitioner

## 2017-07-01 ENCOUNTER — Other Ambulatory Visit: Payer: Medicaid Other

## 2017-07-01 ENCOUNTER — Other Ambulatory Visit: Payer: Self-pay

## 2017-07-01 MED ORDER — CYANOCOBALAMIN 1000 MCG/ML IJ SOLN: 1000.0000 ug | INTRAMUSCULAR | 0 refills | Status: DC

## 2017-07-02 ENCOUNTER — Ambulatory Visit: Payer: Medicaid Other | Admitting: Vascular Surgery

## 2017-07-08 NOTE — Telephone Encounter (Signed)
Attempts have been made to patient with no call back- this encounter will be closed.

## 2017-07-09 ENCOUNTER — Encounter: Payer: Self-pay | Admitting: Vascular Surgery

## 2017-07-09 ENCOUNTER — Other Ambulatory Visit: Payer: Self-pay

## 2017-07-09 ENCOUNTER — Ambulatory Visit (HOSPITAL_COMMUNITY)
Admission: RE | Admit: 2017-07-09 | Discharge: 2017-07-09 | Disposition: A | Discharge status: Home / Self Care | Payer: Medicaid Other | Source: Ambulatory Visit | Attending: Vascular Surgery | Admitting: Vascular Surgery

## 2017-07-09 ENCOUNTER — Ambulatory Visit (INDEPENDENT_AMBULATORY_CARE_PROVIDER_SITE_OTHER): Payer: Medicaid Other | Admitting: Vascular Surgery

## 2017-07-09 VITALS — BP 128/85 | HR 63 | Temp 98.0°F | Resp 16 | Ht 61.0 in | Wt 188.0 lb

## 2017-07-09 DIAGNOSIS — Z9889 Other specified postprocedural states: Secondary | ICD-10-CM | POA: Insufficient documentation

## 2017-07-09 DIAGNOSIS — I83899 Varicose veins of unspecified lower extremities with other complications: Secondary | ICD-10-CM | POA: Diagnosis not present

## 2017-07-09 DIAGNOSIS — I83812 Varicose veins of left lower extremities with pain: Secondary | ICD-10-CM | POA: Insufficient documentation

## 2017-07-09 NOTE — Progress Notes (Signed)
[facility] [place]  Patient name: Angela Michael MRN: [id number] DOB: 1960/06/22 Sex: female  REASON FOR VISIT: Follow-up left small saphenous vein ablation on 06/11/2017  HPI: Angela Michael is a 57 y.o. female here today for follow-up.  And well.  She reports that she did have more than the usual amount of swelling initially after the procedure and this is now completely resolved.  Past Medical History:  Diagnosis Date  . Anemia   . CAD (coronary artery disease)    RCA 50%, LAD 30% cath 2017.   Marland Kitchen Chronic back pain   . Chronic neck pain   . Chronic pain   . Depression   . Headache   . Heart murmur   . History of syncope   . Hyperlipidemia   . Lymphoma ([place])    Stage 2 - year 3 of remission  . Neuropathy   . Panniculitis   . Peripheral edema   . PVC's (premature ventricular contractions)   . Seizures ([place])   . Sleep apnea    no longer needed it after weight loss  . SVT (supraventricular tachycardia) ([place])   . Varicose veins of both lower extremities   . Wears glasses     Family History  Problem Relation Age of Onset  . Liver cancer Father   . Diabetes Father   . Kidney cancer Father   . Heart failure Brother   . Heart attack Brother   . Tuberculosis Maternal Grandmother   . Bone cancer Maternal Grandfather   . Diabetes Paternal Uncle   . Lung cancer Mother   . Coronary artery disease Neg Hx   . Sudden death Neg Hx   . Cardiomyopathy Neg Hx   . Esophageal cancer Neg Hx   . Stomach cancer Neg Hx     SOCIAL HISTORY: Social History   Tobacco Use  . Smoking status: Never Smoker  . Smokeless tobacco: Never Used  Substance Use Topics  . Alcohol use: Yes    Alcohol/week: 0.0 oz    Comment: rare    Allergies  Allergen Reactions  . Advicor [Niacin-Lovastatin Er] Rash    ALLERGY / INTOLERANCE    Current Outpatient Medications  Medication Sig Dispense Refill  . acetaminophen (TYLENOL) 500 MG  tablet Take 1,000 mg every 6 (six) hours as needed by mouth for moderate pain or headache.    Marland Kitchen aspirin 81 MG tablet Take 81 mg by mouth daily. Reported on 06/21/2015    . atorvastatin (LIPITOR) 40 MG tablet Take 1 tablet (40 mg total) by mouth at bedtime. 30 tablet 5  . cyanocobalamin (,VITAMIN B-12,) 1000 MCG/ML injection Inject 1 mL (1,000 mcg total) into the muscle every 30 (thirty) days. 30 mL 0  . cyclobenzaprine (FLEXERIL) 5 MG tablet TAKE 1 TABLET BY MOUTH THREE TIMES A DAY AS NEEDED FOR MUSCLE SPASMS 90 tablet 0  . docusate sodium (COLACE) 100 MG capsule Take 1 capsule (100 mg total) by mouth 2 (two) times daily. (Patient taking differently: Take 100 mg 2 (two) times daily as needed by mouth for mild constipation. ) 60 capsule 3  . Fe Fum-FePoly-Vit C-Vit B3 (INTEGRA) 62.5-62.5-40-3 MG CAPS Take 1 capsule by mouth daily. 90 capsule 1  . fentaNYL (DURAGESIC) 50 MCG/HR Place 1 patch (50 mcg total) onto the skin every 3 (three) days. 10 patch 0  . fentaNYL (DURAGESIC) 50 MCG/HR Place 1 patch (50 mcg total) onto the skin every 3 (three) days. 10 patch 0  .  fentaNYL (DURAGESIC) 50 MCG/HR Place 1 patch (50 mcg total) onto the skin every 3 (three) days. 10 patch 0  . FLUoxetine (PROZAC) 40 MG capsule Take 2 capsules (80 mg total) by mouth daily. 60 capsule 5  . fluticasone (FLONASE) 50 MCG/ACT nasal spray PLACE 2 SPRAYS INTO THE NOSE AS NEEDED. FOR ALLERGIES 16 g 3  . furosemide (LASIX) 40 MG tablet TAKE 1 TABLET (40 MG TOTAL) BY MOUTH DAILY. 30 tablet 5  . gabapentin (NEURONTIN) 300 MG capsule TAKE 2 CAPSULE BY MOUTH TID 180 capsule 5  . levETIRAcetam (KEPPRA) 500 MG tablet TAKE 2 TABLETS BY MOUTH 2 TIMES DAILY 120 tablet 5  . loratadine (CLARITIN) 10 MG tablet Take 10 mg daily as needed by mouth for allergies.     . Multiple Vitamin (MULTIVITAMIN WITH MINERALS) TABS Take 1 tablet by mouth daily. Reported on 06/21/2015    . nitroGLYCERIN (NITROSTAT) 0.4 MG SL tablet Place 1 tablet (0.4 mg total)  under the tongue every 5 (five) minutes as needed for chest pain. 25 tablet 3  . oseltamivir (TAMIFLU) 75 MG capsule Take 1 capsule (75 mg total) by mouth 2 (two) times daily. 10 capsule 0  . polyethylene glycol powder (GLYCOLAX/MIRALAX) powder Take 17 g by mouth daily as needed. (Patient taking differently: Take 17 g daily as needed by mouth for moderate constipation. ) 3350 g 1  . potassium chloride SA (K-DUR,KLOR-CON) 20 MEQ tablet Take 1 tablet (20 mEq total) by mouth 2 (two) times daily. 30 tablet 5  . traZODone (DESYREL) 50 MG tablet TAKE 1 TO 3 TABLETS AT BEDTIME 270 tablet 1  . cefdinir (OMNICEF) 300 MG capsule Take 1 capsule (300 mg total) by mouth 2 (two) times daily. 1 po BID (Patient not taking: Reported on 07/09/2017) 20 capsule 0  . enoxaparin (LOVENOX) 40 MG/0.4ML injection Inject 0.4 mLs (40 mg total) into the skin daily. (Patient not taking: Reported on 07/09/2017) 5 Syringe 0   No current facility-administered medications for this visit.     REVIEW OF SYSTEMS:  [X]  denotes positive finding, [ ]  denotes negative finding Cardiac  Comments:  Chest pain or chest pressure:    Shortness of breath upon exertion:    Short of breath when lying flat:    Irregular heart rhythm:        Vascular    Pain in calf, thigh, or hip brought on by ambulation:    Pain in feet at night that wakes you up from your sleep:     Blood clot in your veins:    Leg swelling:  x         PHYSICAL EXAM: Vitals:   07/09/17 1142  BP: 128/85  Pulse: 63  Resp: 16  Temp: 98 F (36.7 C)  TempSrc: Oral  SpO2: 92%  Weight: 188 lb (85.3 kg)  Height: 5\' 1"  (1.549 m)    GENERAL: The patient is a well-nourished female, in no acute distress. The vital signs are documented above. CARDIOVASCULAR: No large varicosities in her left leg.  Palpable dorsalis pedis pulse PULMONARY: There is good air exchange  MUSCULOSKELETAL: There are no major deformities or cyanosis. NEUROLOGIC: No focal weakness or  paresthesias are detected. SKIN: There are no ulcers or rashes noted. PSYCHIATRIC: The patient has a normal affect.  DATA:  Duplex today shows no evidence of DVT.  Left small saphenous vein is ablated to within 3 cm of the saphenous popliteal junction  MEDICAL ISSUES: Stable follow-up after left small saphenous  vein ablation.  She will wear her compression garments on an as-needed basis and see [place] again as needed    Rosetta Posner, MD FACS [facility] Tel [phone] Pager [phone]

## 2017-07-13 ENCOUNTER — Telehealth: Payer: Self-pay | Admitting: Nurse Practitioner

## 2017-07-13 DIAGNOSIS — G5691 Unspecified mononeuropathy of right upper limb: Secondary | ICD-10-CM

## 2017-07-13 MED ORDER — FENTANYL 50 MCG/HR TD PT72: 50.0000 ug | MEDICATED_PATCH | TRANSDERMAL | 0 refills | Status: DC

## 2017-07-13 NOTE — Telephone Encounter (Signed)
What do you mean by destroy

## 2017-07-13 NOTE — Telephone Encounter (Signed)
Electronic RX comes as image and tech erased image per [facility] Please send over new RX

## 2017-07-15 ENCOUNTER — Other Ambulatory Visit: Payer: Self-pay | Admitting: Nurse Practitioner

## 2017-08-13 ENCOUNTER — Ambulatory Visit: Payer: Medicaid Other | Admitting: Nurse Practitioner

## 2017-08-13 ENCOUNTER — Encounter: Payer: Self-pay | Admitting: Nurse Practitioner

## 2017-08-13 VITALS — BP 132/66 | HR 62 | Temp 97.7°F | Ht 61.0 in | Wt 191.0 lb

## 2017-08-13 DIAGNOSIS — R569 Unspecified convulsions: Secondary | ICD-10-CM

## 2017-08-13 DIAGNOSIS — I471 Supraventricular tachycardia: Secondary | ICD-10-CM | POA: Diagnosis not present

## 2017-08-13 DIAGNOSIS — G5691 Unspecified mononeuropathy of right upper limb: Secondary | ICD-10-CM | POA: Diagnosis not present

## 2017-08-13 DIAGNOSIS — E876 Hypokalemia: Secondary | ICD-10-CM

## 2017-08-13 DIAGNOSIS — F3342 Major depressive disorder, recurrent, in full remission: Secondary | ICD-10-CM

## 2017-08-13 DIAGNOSIS — G894 Chronic pain syndrome: Secondary | ICD-10-CM

## 2017-08-13 DIAGNOSIS — D696 Thrombocytopenia, unspecified: Secondary | ICD-10-CM

## 2017-08-13 DIAGNOSIS — R609 Edema, unspecified: Secondary | ICD-10-CM

## 2017-08-13 DIAGNOSIS — E785 Hyperlipidemia, unspecified: Secondary | ICD-10-CM

## 2017-08-13 DIAGNOSIS — C8590 Non-Hodgkin lymphoma, unspecified, unspecified site: Secondary | ICD-10-CM | POA: Diagnosis not present

## 2017-08-13 DIAGNOSIS — F5101 Primary insomnia: Secondary | ICD-10-CM | POA: Diagnosis not present

## 2017-08-13 MED ORDER — ATORVASTATIN CALCIUM 40 MG PO TABS: 40.0000 mg | ORAL_TABLET | Freq: Every day | ORAL | 5 refills | Status: DC

## 2017-08-13 MED ORDER — FENTANYL 50 MCG/HR TD PT72: 50.0000 ug | MEDICATED_PATCH | TRANSDERMAL | 0 refills | Status: DC

## 2017-08-13 MED ORDER — LEVETIRACETAM 500 MG PO TABS
ORAL_TABLET | ORAL | 5 refills | Status: DC
Start: 2017-08-13 — End: 2017-11-17

## 2017-08-13 MED ORDER — FUROSEMIDE 40 MG PO TABS: ORAL_TABLET | ORAL | 5 refills | Status: DC

## 2017-08-13 MED ORDER — TRAZODONE HCL 50 MG PO TABS: ORAL_TABLET | ORAL | 1 refills | Status: DC

## 2017-08-13 MED ORDER — POTASSIUM CHLORIDE CRYS ER 20 MEQ PO TBCR: 20.0000 meq | EXTENDED_RELEASE_TABLET | Freq: Two times a day (BID) | ORAL | 5 refills | Status: DC

## 2017-08-13 MED ORDER — GABAPENTIN 300 MG PO CAPS: ORAL_CAPSULE | ORAL | 5 refills | Status: DC

## 2017-08-13 MED ORDER — FLUOXETINE HCL 40 MG PO CAPS: 80.0000 mg | ORAL_CAPSULE | Freq: Every day | ORAL | 5 refills | Status: DC

## 2017-08-13 NOTE — Addendum Note (Signed)
Addended by: Rolena Infante on: 08/13/2017 11:20 AM   Modules accepted: Orders

## 2017-08-13 NOTE — Progress Notes (Signed)
Subjective:    Patient ID: Angela Michael, female    DOB: 13-Jan-1961, 57 y.o.   MRN: [id number]   Chief Complaint: medical managemnt of chronic medical problems  HPI:  1. SVT (supraventricular tachycardia) ([place])  Denies any recent episodes  2. Neuropathy of right upper extremity  Still has burning and hurting. Not getting any pain meds because of overdose.  3. Thrombocytopenia ([facility])  Blast platelet count was low.  4. Seizures ([place])  No recent seizure activity. Has not seen neurologist in several years.  5. Peripheral edema  Swelling has gotten better. Is not up on her feet as much  6. Morbid obesity ([place])  Weight is up 4 lbs from last visit  7. Non-Hodgkin's lymphoma, unspecified body region, unspecified non-Hodgkin lymphoma type [facility]) oncology is watching her- has ordered tests to make sure has not spreadto liver. She says test were negative. She has follow up next month  8. Insomnia, unspecified type  Is currently on trazadone to sleep. Says that it is working well for her  9. Hypokalemia  No c/o lower ext cramping  10. Hyperlipidemia with target LDL less than 100  Does not watch diet. Very little exercise  11. Depression, unspecified depression type  Is on prozac and seems to be under control for now. Depression screen [facility] 2/9 08/13/2017 05/19/2017 05/12/2017  Decreased Interest 0 0 0  Down, Depressed, Hopeless 0 0 0  PHQ - 2 Score 0 0 0  Altered sleeping - - -  Tired, decreased energy - - -  Change in appetite - - -  Feeling bad or failure about yourself  - - -  Trouble concentrating - - -  Moving slowly or fidgety/restless - - -  Suicidal thoughts - - -  PHQ-9 Score - - -  Some recent data might be hidden     12. Chronic pain syndrome  She was in hospital last year with possible overdose. Patient denies was overdose but doctors convinced that that was issue. We only do fentanyl patches now for her    Outpatient Encounter Medications as of 08/13/2017  Medication Sig    . acetaminophen (TYLENOL) 500 MG tablet Take 1,000 mg every 6 (six) hours as needed by mouth for moderate pain or headache.  Marland Kitchen aspirin 81 MG tablet Take 81 mg by mouth daily. Reported on 06/21/2015  . atorvastatin (LIPITOR) 40 MG tablet Take 1 tablet (40 mg total) by mouth at bedtime.  . cyanocobalamin (,VITAMIN B-12,) 1000 MCG/ML injection Inject 1 mL (1,000 mcg total) into the muscle every 30 (thirty) days.  . cyclobenzaprine (FLEXERIL) 5 MG tablet TAKE 1 TABLET BY MOUTH THREE TIMES A DAY AS NEEDED FOR MUSCLE SPASMS  . docusate sodium (COLACE) 100 MG capsule Take 1 capsule (100 mg total) by mouth 2 (two) times daily. (Patient taking differently: Take 100 mg 2 (two) times daily as needed by mouth for mild constipation. )  . Fe Fum-FePoly-Vit C-Vit B3 (INTEGRA) 62.5-62.5-40-3 MG CAPS Take 1 capsule by mouth daily.  . fentaNYL (DURAGESIC) 50 MCG/HR Place 1 patch (50 mcg total) onto the skin every 3 (three) days.  . fentaNYL (DURAGESIC) 50 MCG/HR Place 1 patch (50 mcg total) onto the skin every 3 (three) days.  Marland Kitchen FLUoxetine (PROZAC) 40 MG capsule Take 2 capsules (80 mg total) by mouth daily.  . fluticasone (FLONASE) 50 MCG/ACT nasal spray PLACE 2 SPRAYS INTO THE NOSE AS NEEDED. FOR ALLERGIES  . furosemide (LASIX) 40 MG tablet TAKE 1 TABLET (40  MG TOTAL) BY MOUTH DAILY.  Marland Kitchen gabapentin (NEURONTIN) 300 MG capsule TAKE 2 CAPSULE BY MOUTH TID  . levETIRAcetam (KEPPRA) 500 MG tablet TAKE 2 TABLETS BY MOUTH 2 TIMES DAILY  . loratadine (CLARITIN) 10 MG tablet Take 10 mg daily as needed by mouth for allergies.   . Multiple Vitamin (MULTIVITAMIN WITH MINERALS) TABS Take 1 tablet by mouth daily. Reported on 06/21/2015  . nitroGLYCERIN (NITROSTAT) 0.4 MG SL tablet Place 1 tablet (0.4 mg total) under the tongue every 5 (five) minutes as needed for chest pain.  . polyethylene glycol powder (GLYCOLAX/MIRALAX) powder Take 17 g by mouth daily as needed. (Patient taking differently: Take 17 g daily as needed by mouth for  moderate constipation. )  . potassium chloride SA (K-DUR,KLOR-CON) 20 MEQ tablet Take 1 tablet (20 mEq total) by mouth 2 (two) times daily.  . traZODone (DESYREL) 50 MG tablet TAKE 1 TO 3 TABLETS AT BEDTIME      New complaints: None today  Social history: Lives with husband, daughter and granddaughter   Review of Systems  Constitutional: Negative for activity change and appetite change.  HENT: Negative.   Eyes: Negative for pain.  Respiratory: Negative for shortness of breath.   Cardiovascular: Negative for chest pain, palpitations and leg swelling.  Gastrointestinal: Negative for abdominal pain.  Endocrine: Negative for polydipsia.  Genitourinary: Negative.   Skin: Negative for rash.  Neurological: Negative for dizziness, weakness and headaches.  Hematological: Does not bruise/bleed easily.  Psychiatric/Behavioral: Negative.   All other systems reviewed and are negative.      Objective:   Physical Exam  Constitutional: She is oriented to person, place, and time.  HENT:  Head: Normocephalic.  Nose: Nose normal.  Mouth/Throat: Oropharynx is clear and moist.  Eyes: Pupils are equal, round, and reactive to light. EOM are normal.  Neck: Normal range of motion. Neck supple. No JVD present. Carotid bruit is not present.  Cardiovascular: Normal rate, regular rhythm, normal heart sounds and intact distal pulses.  Pulmonary/Chest: Effort normal and breath sounds normal. No respiratory distress. She has no wheezes. She has no rales. She exhibits no tenderness.  Abdominal: Soft. Normal appearance, normal aorta and bowel sounds are normal. She exhibits no distension, no abdominal bruit, no pulsatile midline mass and no mass. There is no splenomegaly or hepatomegaly. There is no tenderness.  Musculoskeletal: Normal range of motion. She exhibits no edema.  Lymphadenopathy:    She has no cervical adenopathy.  Neurological: She is alert and oriented to person, place, and time. She has  normal reflexes.  Skin: Skin is warm and dry.  Psychiatric: Judgment normal.   BP 132/66   Pulse 62   Temp 97.7 F (36.5 C) (Oral)   Ht 5\' 1"  (1.549 m)   Wt 191 lb (86.6 kg)   BMI 36.09 kg/m       Assessment & Plan:  Angela Michael comes in today with chief complaint of Medical Management of Chronic Issues   Diagnosis and orders addressed:  1. SVT (supraventricular tachycardia) ([place]) Report any episodes of heart racing  2. Neuropathy of right upper extremity  3. Thrombocytopenia ([place]) Keep follo wup with hematology  4. Seizures ([place]) Report any seizure activity - levETIRAcetam (KEPPRA) 500 MG tablet; TAKE 2 TABLETS BY MOUTH 2 TIMES DAILY  Dispense: 120 tablet; Refill: 5  5. Peripheral edema Elevate legs when sitting - furosemide (LASIX) 40 MG tablet; TAKE 1 TABLET (40 MG TOTAL) BY MOUTH DAILY.  Dispense: 30 tablet; Refill: 5  6. Morbid obesity ([place]) Discussed diet and exercise for person with BMI >25 Will recheck weight in 3-6 months   7. Non-Hodgkin's lymphoma, unspecified body region, unspecified non-Hodgkin lymphoma type [facility]) Keep follow up with oncology  8.  Primary insomnia Bedtime routine - traZODone (DESYREL) 50 MG tablet; TAKE 1 TO 3 TABLETS AT BEDTIME  Dispense: 270 tablet; Refill: 1   9. Hypokalemia - potassium chloride SA (K-DUR,KLOR-CON) 20 MEQ tablet; Take 1 tablet (20 mEq total) by mouth 2 (two) times daily.  Dispense: 30 tablet; Refill: 5  10. Hyperlipidemia with target LDL less than 100 Low fat diet  - atorvastatin (LIPITOR) 40 MG tablet; Take 1 tablet (40 mg total) by mouth at bedtime.  Dispense: 30 tablet; Refill: 5  11.  Recurrent major depressive disorder, in full remission ([place]) Stress management - FLUoxetine (PROZAC) 40 MG capsule; Take 2 capsules (80 mg total) by mouth daily.  Dispense: 60 capsule; Refill: 5  12. Chronic pain syndrome Discussed pain contract  13. Neuropathy of upper extremity, right - gabapentin (NEURONTIN)  300 MG capsule; TAKE 2 CAPSULE BY MOUTH TID  Dispense: 180 capsule; Refill: 5 - fentaNYL (DURAGESIC) 50 MCG/HR; Place 1 patch (50 mcg total) onto the skin every 3 (three) days.  Dispense: 10 patch; Refill: 0 - fentaNYL (DURAGESIC) 50 MCG/HR; Place 1 patch (50 mcg total) onto the skin every 3 (three) days.  Dispense: 10 patch; Refill: 0   Labs pending Health Maintenance reviewed Diet and exercise encouraged  Follow up plan: 3 months   Mary-Margaret Hassell Done, FNP

## 2017-08-13 NOTE — Patient Instructions (Signed)
Stress and Stress Management Stress is a normal reaction to life events. It is what you feel when life demands more than you are used to or more than you can handle. Some stress can be useful. For example, the stress reaction can help you catch the last bus of the day, study for a test, or meet a deadline at work. But stress that occurs too often or for too long can cause problems. It can affect your emotional health and interfere with relationships and normal daily activities. Too much stress can weaken your immune system and increase your risk for physical illness. If you already have a medical problem, stress can make it worse. What are the causes? All sorts of life events may cause stress. An event that causes stress for one person may not be stressful for another person. Major life events commonly cause stress. These may be positive or negative. Examples include losing your job, moving into a new home, getting married, having a baby, or losing a loved one. Less obvious life events may also cause stress, especially if they occur day after day or in combination. Examples include working long hours, driving in traffic, caring for children, being in debt, or being in a difficult relationship. What are the signs or symptoms? Stress may cause emotional symptoms including, the following:  Anxiety. This is feeling worried, afraid, on edge, overwhelmed, or out of control.  Anger. This is feeling irritated or impatient.  Depression. This is feeling sad, down, helpless, or guilty.  Difficulty focusing, remembering, or making decisions.  Stress may cause physical symptoms, including the following:  Aches and pains. These may affect your head, neck, back, stomach, or other areas of your body.  Tight muscles or clenched jaw.  Low energy or trouble sleeping.  Stress may cause unhealthy behaviors, including the following:  Eating to feel better (overeating) or skipping meals.  Sleeping too little,  too much, or both.  Working too much or putting off tasks (procrastination).  Smoking, drinking alcohol, or using drugs to feel better.  How is this diagnosed? Stress is diagnosed through an assessment by your health care provider. Your health care provider will ask questions about your symptoms and any stressful life events.Your health care provider will also ask about your medical history and may order blood tests or other tests. Certain medical conditions and medicine can cause physical symptoms similar to stress. Mental illness can cause emotional symptoms and unhealthy behaviors similar to stress. Your health care provider may refer you to a mental health professional for further evaluation. How is this treated? Stress management is the recommended treatment for stress.The goals of stress management are reducing stressful life events and coping with stress in healthy ways. Techniques for reducing stressful life events include the following:  Stress identification. Self-monitor for stress and identify what causes stress for you. These skills may help you to avoid some stressful events.  Time management. Set your priorities, keep a calendar of events, and learn to say "no." These tools can help you avoid making too many commitments.  Techniques for coping with stress include the following:  Rethinking the problem. Try to think realistically about stressful events rather than ignoring them or overreacting. Try to find the positives in a stressful situation rather than focusing on the negatives.  Exercise. Physical exercise can release both physical and emotional tension. The key is to find a form of exercise you enjoy and do it regularly.  Relaxation techniques. These relax the body and  mind. Examples include yoga, meditation, tai chi, biofeedback, deep breathing, progressive muscle relaxation, listening to music, being out in nature, journaling, and other hobbies. Again, the key is to find  one or more that you enjoy and can do regularly.  Healthy lifestyle. Eat a balanced diet, get plenty of sleep, and do not smoke. Avoid using alcohol or drugs to relax.  Strong support network. Spend time with family, friends, or other people you enjoy being around.Express your feelings and talk things over with someone you trust.  Counseling or talktherapy with a mental health professional may be helpful if you are having difficulty managing stress on your own. Medicine is typically not recommended for the treatment of stress.Talk to your health care provider if you think you need medicine for symptoms of stress. Follow these instructions at home:  Keep all follow-up visits as directed by your health care provider.  Take all medicines as directed by your health care provider. Contact a health care provider if:  Your symptoms get worse or you start having new symptoms.  You feel overwhelmed by your problems and can no longer manage them on your own. Get help right away if:  You feel like hurting yourself or someone else. This information is not intended to replace advice given to you by your health care provider. Make sure you discuss any questions you have with your health care provider. Document Released: 09/24/2000 Document Revised: 09/06/2015 Document Reviewed: 11/23/2012 Elsevier Interactive Patient Education  2017 Elsevier Inc.  

## 2017-08-14 LAB — CMP14+EGFR
A/G RATIO: 1.6 (ref 1.2–2.2)
ALT: 21 IU/L (ref 0–32)
AST: 23 IU/L (ref 0–40)
Albumin: 4.1 g/dL (ref 3.5–5.5)
Alkaline Phosphatase: 158 IU/L — ABNORMAL HIGH (ref 39–117)
BILIRUBIN TOTAL: 0.8 mg/dL (ref 0.0–1.2)
BUN/Creatinine Ratio: 15 (ref 9–23)
BUN: 13 mg/dL (ref 6–24)
CHLORIDE: 99 mmol/L (ref 96–106)
CO2: 28 mmol/L (ref 20–29)
Calcium: 9.4 mg/dL (ref 8.7–10.2)
Creatinine, Ser: 0.85 mg/dL (ref 0.57–1.00)
GFR calc non Af Amer: 77 mL/min/{1.73_m2} (ref >59)
GFR, EST AFRICAN AMERICAN: 89 mL/min/{1.73_m2} (ref >59)
Globulin, Total: 2.5 g/dL (ref 1.5–4.5)
Glucose: 86 mg/dL (ref 65–99)
POTASSIUM: 3.8 mmol/L (ref 3.5–5.2)
Sodium: 142 mmol/L (ref 134–144)
Total Protein: 6.6 g/dL (ref 6.0–8.5)

## 2017-08-14 LAB — LIPID PANEL
Chol/HDL Ratio: 3 ratio (ref 0.0–4.4)
Cholesterol, Total: 163 mg/dL (ref 100–199)
HDL: 55 mg/dL (ref >39)
LDL Calculated: 90 mg/dL (ref 0–99)
TRIGLYCERIDES: 91 mg/dL (ref 0–149)
VLDL Cholesterol Cal: 18 mg/dL (ref 5–40)

## 2017-08-17 ENCOUNTER — Other Ambulatory Visit: Payer: Self-pay | Admitting: Nurse Practitioner

## 2017-08-19 ENCOUNTER — Other Ambulatory Visit: Payer: Self-pay | Admitting: Nurse Practitioner

## 2017-09-26 ENCOUNTER — Other Ambulatory Visit: Payer: Self-pay | Admitting: Nurse Practitioner

## 2017-10-25 ENCOUNTER — Other Ambulatory Visit: Payer: Self-pay | Admitting: Nurse Practitioner

## 2017-10-26 ENCOUNTER — Ambulatory Visit: Payer: Medicaid Other | Admitting: Pediatrics

## 2017-10-26 ENCOUNTER — Encounter: Payer: Self-pay | Admitting: Pediatrics

## 2017-10-26 VITALS — BP 106/77 | HR 67 | Temp 99.8°F | Ht 61.0 in | Wt 198.6 lb

## 2017-10-26 DIAGNOSIS — J029 Acute pharyngitis, unspecified: Secondary | ICD-10-CM | POA: Diagnosis not present

## 2017-10-26 DIAGNOSIS — J020 Streptococcal pharyngitis: Secondary | ICD-10-CM | POA: Diagnosis not present

## 2017-10-26 LAB — RAPID STREP SCREEN (MED CTR MEBANE ONLY): Strep Gp A Ag, IA W/Reflex: POSITIVE — AB

## 2017-10-26 MED ORDER — CYCLOBENZAPRINE HCL 5 MG PO TABS: ORAL_TABLET | ORAL | 0 refills | Status: DC

## 2017-10-26 MED ORDER — AMOXICILLIN 500 MG PO CAPS: 500.0000 mg | ORAL_CAPSULE | Freq: Two times a day (BID) | ORAL | 0 refills | Status: AC

## 2017-10-26 NOTE — Progress Notes (Signed)
  Subjective:   Patient ID: Angela Michael, female    DOB: October 05, 1960, 57 y.o.   MRN: [id number] CC: Cough; Nasal Congestion; Sore Throat; and Laryngitis  HPI: Angela Michael is a 57 y.o. female   Started having symptoms this morning.  Throat is very sore.  Voice is hoarse.  Small amount of coughing this morning.  Low-grade temperature.  Neck feels sore.  Appetite is down.  But grandchild has had similar symptoms.  Relevant past medical, surgical, family and social history reviewed. Allergies and medications reviewed and updated. Social History   Tobacco Use  Smoking Status Never Smoker  Smokeless Tobacco Never Used   ROS: Per HPI   Objective:    BP 106/77   Pulse 67   Temp 99.8 F (37.7 C) (Oral)   Ht 5\' 1"  (1.549 m)   Wt 198 lb 9.6 oz (90.1 kg)   BMI 37.53 kg/m   Wt Readings from Last 3 Encounters:  10/26/17 198 lb 9.6 oz (90.1 kg)  08/13/17 191 lb (86.6 kg)  07/09/17 188 lb (85.3 kg)   Gen: NAD, alert, cooperative with exam, NCAT, voice is hoarse EYES: EOMI, no conjunctival injection, or no icterus ENT:  TMs pearly gray b/l, OP with mild erythema LYMPH: no cervical LAD CV: NRRR, normal S1/S2, no murmur, distal pulses 2+ b/l Resp: CTABL, no wheezes, normal WOB Abd: +BS, soft, NTND. no guarding or organomegaly Ext: No edema, warm Neuro: Alert and oriented MSK: normal muscle bulk  Assessment & Plan:  Angela Michael was seen today for cough, nasal congestion, sore throat and laryngitis.  Diagnoses and all orders for this visit:  Sore throat -     Rapid Strep Screen ([facility] ONLY) -     Culture, Group A Strep  Strep pharyngitis Rapid test positive, will treat with below.  Symptomatic care discussed. -     amoxicillin (AMOXIL) 500 MG capsule; Take 1 capsule (500 mg total) by mouth 2 (two) times daily for 10 days.   Follow up plan: As needed Assunta Found, MD [place]

## 2017-10-26 NOTE — Patient Instructions (Signed)
Fever reducer and headache: tylenol   Sinus pressure:  Nasal steroid such as flonase/fluticaone or nasocort daily Can also take daily antihistamine such as loratadine/claritin or cetirizine/zyrtec  Sinus rinses/irritation: Netipot or similar with distilled water 2-3 times a day to clear out sinuses or Normal saline nasal spray  Sore throat:  Throat lozenges chloroseptic spray  Stick with bland foods Drink lots of fluids  

## 2017-11-12 ENCOUNTER — Other Ambulatory Visit: Payer: Self-pay | Admitting: Nurse Practitioner

## 2017-11-12 DIAGNOSIS — D509 Iron deficiency anemia, unspecified: Secondary | ICD-10-CM

## 2017-11-16 ENCOUNTER — Other Ambulatory Visit: Payer: Self-pay | Admitting: Nurse Practitioner

## 2017-11-17 ENCOUNTER — Ambulatory Visit: Payer: Medicaid Other | Admitting: Nurse Practitioner

## 2017-11-17 ENCOUNTER — Encounter: Payer: Self-pay | Admitting: Nurse Practitioner

## 2017-11-17 VITALS — BP 115/80 | HR 82 | Temp 98.1°F | Ht 61.0 in | Wt 203.0 lb

## 2017-11-17 DIAGNOSIS — F5101 Primary insomnia: Secondary | ICD-10-CM

## 2017-11-17 DIAGNOSIS — R569 Unspecified convulsions: Secondary | ICD-10-CM | POA: Diagnosis not present

## 2017-11-17 DIAGNOSIS — D509 Iron deficiency anemia, unspecified: Secondary | ICD-10-CM

## 2017-11-17 DIAGNOSIS — E876 Hypokalemia: Secondary | ICD-10-CM

## 2017-11-17 DIAGNOSIS — C8590 Non-Hodgkin lymphoma, unspecified, unspecified site: Secondary | ICD-10-CM

## 2017-11-17 DIAGNOSIS — I471 Supraventricular tachycardia: Secondary | ICD-10-CM

## 2017-11-17 DIAGNOSIS — G5691 Unspecified mononeuropathy of right upper limb: Secondary | ICD-10-CM | POA: Diagnosis not present

## 2017-11-17 DIAGNOSIS — F3342 Major depressive disorder, recurrent, in full remission: Secondary | ICD-10-CM

## 2017-11-17 DIAGNOSIS — R6510 Systemic inflammatory response syndrome (SIRS) of non-infectious origin without acute organ dysfunction: Secondary | ICD-10-CM

## 2017-11-17 DIAGNOSIS — G894 Chronic pain syndrome: Secondary | ICD-10-CM

## 2017-11-17 DIAGNOSIS — R609 Edema, unspecified: Secondary | ICD-10-CM

## 2017-11-17 DIAGNOSIS — E785 Hyperlipidemia, unspecified: Secondary | ICD-10-CM

## 2017-11-17 LAB — CMP14+EGFR
A/G RATIO: 2.3 — AB (ref 1.2–2.2)
ALBUMIN: 4.6 g/dL (ref 3.5–5.5)
ALT: 24 IU/L (ref 0–32)
AST: 26 IU/L (ref 0–40)
Alkaline Phosphatase: 147 IU/L — ABNORMAL HIGH (ref 39–117)
BILIRUBIN TOTAL: 0.6 mg/dL (ref 0.0–1.2)
BUN/Creatinine Ratio: 13 (ref 9–23)
BUN: 13 mg/dL (ref 6–24)
CHLORIDE: 99 mmol/L (ref 96–106)
CO2: 26 mmol/L (ref 20–29)
Calcium: 9.5 mg/dL (ref 8.7–10.2)
Creatinine, Ser: 0.97 mg/dL (ref 0.57–1.00)
GFR, EST AFRICAN AMERICAN: 76 mL/min/{1.73_m2} (ref >59)
GFR, EST NON AFRICAN AMERICAN: 66 mL/min/{1.73_m2} (ref >59)
Globulin, Total: 2 g/dL (ref 1.5–4.5)
Glucose: 99 mg/dL (ref 65–99)
POTASSIUM: 4.1 mmol/L (ref 3.5–5.2)
Sodium: 144 mmol/L (ref 134–144)
TOTAL PROTEIN: 6.6 g/dL (ref 6.0–8.5)

## 2017-11-17 LAB — LIPID PANEL
CHOL/HDL RATIO: 2.9 ratio (ref 0.0–4.4)
Cholesterol, Total: 192 mg/dL (ref 100–199)
HDL: 66 mg/dL (ref >39)
LDL Calculated: 103 mg/dL — ABNORMAL HIGH (ref 0–99)
Triglycerides: 116 mg/dL (ref 0–149)
VLDL CHOLESTEROL CAL: 23 mg/dL (ref 5–40)

## 2017-11-17 MED ORDER — INTEGRA 62.5-62.5-40-3 MG PO CAPS: 1.0000 | ORAL_CAPSULE | Freq: Every day | ORAL | 1 refills | Status: DC

## 2017-11-17 MED ORDER — ATORVASTATIN CALCIUM 40 MG PO TABS: 40.0000 mg | ORAL_TABLET | Freq: Every day | ORAL | 5 refills | Status: DC

## 2017-11-17 MED ORDER — TRAZODONE HCL 50 MG PO TABS: ORAL_TABLET | ORAL | 1 refills | Status: DC

## 2017-11-17 MED ORDER — FLUOXETINE HCL 40 MG PO CAPS
80.0000 mg | ORAL_CAPSULE | Freq: Every day | ORAL | 5 refills | Status: DC
Start: 2017-11-17 — End: 2018-02-17

## 2017-11-17 MED ORDER — GABAPENTIN 300 MG PO CAPS: ORAL_CAPSULE | ORAL | 5 refills | Status: DC

## 2017-11-17 MED ORDER — LEVETIRACETAM 500 MG PO TABS
ORAL_TABLET | ORAL | 5 refills | Status: DC
Start: 2017-11-17 — End: 2018-02-17

## 2017-11-17 MED ORDER — POTASSIUM CHLORIDE CRYS ER 20 MEQ PO TBCR: 20.0000 meq | EXTENDED_RELEASE_TABLET | Freq: Two times a day (BID) | ORAL | 5 refills | Status: DC

## 2017-11-17 MED ORDER — FUROSEMIDE 40 MG PO TABS: ORAL_TABLET | ORAL | 5 refills | Status: DC

## 2017-11-17 NOTE — Patient Instructions (Signed)
Stress and Stress Management Stress is a normal reaction to life events. It is what you feel when life demands more than you are used to or more than you can handle. Some stress can be useful. For example, the stress reaction can help you catch the last bus of the day, study for a test, or meet a deadline at work. But stress that occurs too often or for too long can cause problems. It can affect your emotional health and interfere with relationships and normal daily activities. Too much stress can weaken your immune system and increase your risk for physical illness. If you already have a medical problem, stress can make it worse. What are the causes? All sorts of life events may cause stress. An event that causes stress for one person may not be stressful for another person. Major life events commonly cause stress. These may be positive or negative. Examples include losing your job, moving into a new home, getting married, having a baby, or losing a loved one. Less obvious life events may also cause stress, especially if they occur day after day or in combination. Examples include working long hours, driving in traffic, caring for children, being in debt, or being in a difficult relationship. What are the signs or symptoms? Stress may cause emotional symptoms including, the following:  Anxiety. This is feeling worried, afraid, on edge, overwhelmed, or out of control.  Anger. This is feeling irritated or impatient.  Depression. This is feeling sad, down, helpless, or guilty.  Difficulty focusing, remembering, or making decisions.  Stress may cause physical symptoms, including the following:  Aches and pains. These may affect your head, neck, back, stomach, or other areas of your body.  Tight muscles or clenched jaw.  Low energy or trouble sleeping.  Stress may cause unhealthy behaviors, including the following:  Eating to feel better (overeating) or skipping meals.  Sleeping too little,  too much, or both.  Working too much or putting off tasks (procrastination).  Smoking, drinking alcohol, or using drugs to feel better.  How is this diagnosed? Stress is diagnosed through an assessment by your health care provider. Your health care provider will ask questions about your symptoms and any stressful life events.Your health care provider will also ask about your medical history and may order blood tests or other tests. Certain medical conditions and medicine can cause physical symptoms similar to stress. Mental illness can cause emotional symptoms and unhealthy behaviors similar to stress. Your health care provider may refer you to a mental health professional for further evaluation. How is this treated? Stress management is the recommended treatment for stress.The goals of stress management are reducing stressful life events and coping with stress in healthy ways. Techniques for reducing stressful life events include the following:  Stress identification. Self-monitor for stress and identify what causes stress for you. These skills may help you to avoid some stressful events.  Time management. Set your priorities, keep a calendar of events, and learn to say "no." These tools can help you avoid making too many commitments.  Techniques for coping with stress include the following:  Rethinking the problem. Try to think realistically about stressful events rather than ignoring them or overreacting. Try to find the positives in a stressful situation rather than focusing on the negatives.  Exercise. Physical exercise can release both physical and emotional tension. The key is to find a form of exercise you enjoy and do it regularly.  Relaxation techniques. These relax the body and  mind. Examples include yoga, meditation, tai chi, biofeedback, deep breathing, progressive muscle relaxation, listening to music, being out in nature, journaling, and other hobbies. Again, the key is to find  one or more that you enjoy and can do regularly.  Healthy lifestyle. Eat a balanced diet, get plenty of sleep, and do not smoke. Avoid using alcohol or drugs to relax.  Strong support network. Spend time with family, friends, or other people you enjoy being around.Express your feelings and talk things over with someone you trust.  Counseling or talktherapy with a mental health professional may be helpful if you are having difficulty managing stress on your own. Medicine is typically not recommended for the treatment of stress.Talk to your health care provider if you think you need medicine for symptoms of stress. Follow these instructions at home:  Keep all follow-up visits as directed by your health care provider.  Take all medicines as directed by your health care provider. Contact a health care provider if:  Your symptoms get worse or you start having new symptoms.  You feel overwhelmed by your problems and can no longer manage them on your own. Get help right away if:  You feel like hurting yourself or someone else. This information is not intended to replace advice given to you by your health care provider. Make sure you discuss any questions you have with your health care provider. Document Released: 09/24/2000 Document Revised: 09/06/2015 Document Reviewed: 11/23/2012 Elsevier Interactive Patient Education  2017 Elsevier Inc.  

## 2017-11-17 NOTE — Progress Notes (Addendum)
Subjective:    Patient ID: Angela Michael, female    DOB: March 14, 1961, 57 y.o.   MRN: [id number]   Chief Complaint: Medical management of chronic issues  HPI:  1. SVT (supraventricular tachycardia) ([place])  denies any recent episodes of palpitations or heart racing. Has had no recent follow up with cardiology.  2. Neuropathy of right upper extremity  She has had this for years.causes her pain at times. She is on neurotin which helps.  3. SIRS (systemic inflammatory response syndrome) (H[facility])  Has not seen anyone lately for this. Use yo see rheumatology , but sh esays they really weren't doing anything to treat this.  4. Seizures ([place]) deneis any recent seizure activity or syncopial episode. She has not had follow up with neurology this year.  5. Peripheral edema she has edema everyday by end of day. Usually resolves in her sleep.  6. Morbid obesity ([place]) no recent weight changes  7. Non-Hodgkin's lymphoma, unspecified body region, unspecified non-Hodgkin lymphoma type [facility]) last follow up with hematology was done on 08/25/17. No active disease was found. She is to follow up in 3 months  8. Insomnia, unspecified type  Has trouble sleeping, takes trazadone which helps most nights.  9. Hyperlipidemia with target LDL less than 100  Does not really watch diet and does no exercise.  10. Hypokalemia takes potassium supple ment daily- denies any lower ext cramping.  11. Depression, unspecified depression type  takes prozac daily. Has been taking fr several years. Depression screen [facility] 2/9 11/17/2017 10/26/2017 08/13/2017  Decreased Interest 0 0 0  Down, Depressed, Hopeless 0 0 0  PHQ - 2 Score 0 0 0  Altered sleeping - - -  Tired, decreased energy - - -  Change in appetite - - -  Feeling bad or failure about yourself  - - -  Trouble concentrating - - -  Moving slowly or fidgety/restless - - -  Suicidal thoughts - - -  PHQ-9 Score - - -  Some recent data might be hidden     12. Chronic pain  syndrome  She complains of constant pain. She had some issue a year or so ago where sh ewas hospitalized for possible overdose so she can no longer get oxycodone from our office. So she is currently only on her fentanyl patch    Outpatient Encounter Medications as of 11/17/2017  Medication Sig  . acetaminophen (TYLENOL) 500 MG tablet Take 1,000 mg every 6 (six) hours as needed by mouth for moderate pain or headache.  Marland Kitchen aspirin 81 MG tablet Take 81 mg by mouth daily. Reported on 06/21/2015  . atorvastatin (LIPITOR) 40 MG tablet Take 1 tablet (40 mg total) by mouth at bedtime.  . cyanocobalamin (,VITAMIN B-12,) 1000 MCG/ML injection Inject 1 mL (1,000 mcg total) into the muscle every 30 (thirty) days.  . cyclobenzaprine (FLEXERIL) 5 MG tablet TAKE 1 TABLET BY MOUTH THREE TIMES A DAY AS NEEDED FOR MUSCLE SPASMS  . docusate sodium (COLACE) 100 MG capsule Take 1 capsule (100 mg total) by mouth 2 (two) times daily. (Patient taking differently: Take 100 mg 2 (two) times daily as needed by mouth for mild constipation. )  . Fe Fum-FePoly-Vit C-Vit B3 (INTEGRA) 62.5-62.5-40-3 MG CAPS TAKE 1 CAPSULE BY MOUTH EVERY DAY  . FLUoxetine (PROZAC) 40 MG capsule Take 2 capsules (80 mg total) by mouth daily.  . fluticasone (FLONASE) 50 MCG/ACT nasal spray PLACE 2 SPRAYS INTO THE NOSE AS NEEDED. FOR ALLERGIES  . furosemide (  LASIX) 40 MG tablet TAKE 1 TABLET (40 MG TOTAL) BY MOUTH DAILY.  Marland Kitchen gabapentin (NEURONTIN) 300 MG capsule TAKE 2 CAPSULE BY MOUTH TID  . levETIRAcetam (KEPPRA) 500 MG tablet TAKE 2 TABLETS BY MOUTH 2 TIMES DAILY  . loratadine (CLARITIN) 10 MG tablet Take 10 mg daily as needed by mouth for allergies.   . Multiple Vitamin (MULTIVITAMIN WITH MINERALS) TABS Take 1 tablet by mouth daily. Reported on 06/21/2015  . nitroGLYCERIN (NITROSTAT) 0.4 MG SL tablet Place 1 tablet (0.4 mg total) under the tongue every 5 (five) minutes as needed for chest pain.  . polyethylene glycol powder (GLYCOLAX/MIRALAX) powder  Take 17 g by mouth daily as needed. (Patient taking differently: Take 17 g daily as needed by mouth for moderate constipation. )  . potassium chloride SA (K-DUR,KLOR-CON) 20 MEQ tablet Take 1 tablet (20 mEq total) by mouth 2 (two) times daily.  . traZODone (DESYREL) 50 MG tablet TAKE 1 TO 3 TABLETS AT BEDTIME       New complaints: None today  Social history: Lives with husband. Daughter and new granddaughter ive with her.   Review of Systems  Constitutional: Negative for activity change and appetite change.  HENT: Negative.   Eyes: Negative for pain.  Respiratory: Negative for shortness of breath.   Cardiovascular: Negative for chest pain, palpitations and leg swelling.  Gastrointestinal: Negative for abdominal pain.  Endocrine: Negative for polydipsia.  Genitourinary: Negative.   Musculoskeletal: Positive for arthralgias.  Skin: Negative for rash.  Neurological: Negative for dizziness, weakness and headaches.  Hematological: Does not bruise/bleed easily.  Psychiatric/Behavioral: Negative.   All other systems reviewed and are negative.      Objective:   Physical Exam  Constitutional: She is oriented to person, place, and time. She appears well-developed and well-nourished.  HENT:  Head: Normocephalic.  Nose: Nose normal.  Mouth/Throat: Oropharynx is clear and moist.  Eyes: Pupils are equal, round, and reactive to light. EOM are normal.  Neck: Normal range of motion. Neck supple. No JVD present. Carotid bruit is not present.  Cardiovascular: Normal rate, regular rhythm, normal heart sounds and intact distal pulses.  Pulmonary/Chest: Effort normal and breath sounds normal. No respiratory distress. She has no wheezes. She has no rales. She exhibits no tenderness.  Abdominal: Soft. Normal appearance, normal aorta and bowel sounds are normal. She exhibits no distension, no abdominal bruit, no pulsatile midline mass and no mass. There is no splenomegaly or hepatomegaly. There is  no tenderness.  Musculoskeletal: Normal range of motion. She exhibits no edema.  Lymphadenopathy:    She has no cervical adenopathy.  Neurological: She is alert and oriented to person, place, and time. She has normal reflexes.  Skin: Skin is warm and dry.  Psychiatric: She has a normal mood and affect. Her behavior is normal. Judgment and thought content normal.  Nursing note and vitals reviewed.  BP 115/80   Pulse 82   Temp 98.1 F (36.7 C) (Oral)   Ht 5' 1"  (1.549 m)   Wt 203 lb (92.1 kg)   BMI 38.36 kg/m       Assessment & Plan:  Shahrzad Koble comes in today with chief complaint of Medical Management of Chronic Issues   Diagnosis and orders addressed:  1. SVT (supraventricular tachycardia) (HCC) Avoid caffeine Keep diary of episodes  2. Neuropathy of right upper extremity  3. SIRS (systemic inflammatory response syndrome) (HCC)  4. Seizures ([place]) Follow up with neurology if reoccur - levETIRAcetam (KEPPRA) 500 MG tablet;  TAKE 2 TABLETS BY MOUTH 2 TIMES DAILY  Dispense: 120 tablet; Refill: 5  5. Peripheral edema Elevate legs when sitting - furosemide (LASIX) 40 MG tablet; TAKE 1 TABLET (40 MG TOTAL) BY MOUTH DAILY.  Dispense: 30 tablet; Refill: 5  6. Morbid obesity ([place]) Discussed diet and exercise for person with BMI >25 Will recheck weight in 3-6 months  7. Non-Hodgkin's lymphoma, unspecified body region, unspecified non-Hodgkin lymphoma type ([place]) Keep follow up with onclogy  8. Primary insomnia - traZODone (DESYREL) 50 MG tablet; TAKE 1 TO 3 TABLETS AT BEDTIME  Dispense: 270 tablet; Refill: 1 bedtime routine Continue trazadone as rx  9. Hyperlipidemia with target LDL less than 100 lowfta diet - atorvastatin (LIPITOR) 40 MG tablet; Take 1 tablet (40 mg total) by mouth at bedtime.  Dispense: 30 tablet; Refill: 5 - CMP14+EGFR - Lipid panel  10. Hypokalemia - potassium chloride SA (K-DUR,KLOR-CON) 20 MEQ tablet; Take 1 tablet (20 mEq total) by mouth 2  (two) times daily.  Dispense: 30 tablet; Refill: 5  11.  Recurrent major depressive disorder, in full remission ([place]) stress management - FLUoxetine (PROZAC) 40 MG capsule; Take 2 capsules (80 mg total) by mouth daily.  Dispense: 60 capsule; Refill: 5  12. Chronic pain syndrome  13. Iron deficiency anemia, unspecified iron deficiency anemia type - Fe Fum-FePoly-Vit C-Vit B3 (INTEGRA) 62.5-62.5-40-3 MG CAPS; Take 1 tablet by mouth daily.  Dispense: 90 capsule; Refill: 1  14. Neuropathy of upper extremity, right - gabapentin (NEURONTIN) 300 MG capsule; TAKE 2 CAPSULE BY MOUTH TID  Dispense: 180 capsule; Refill: 5   Labs pending Health Maintenance reviewed Diet and exercise encouraged  Follow up plan: 3 months   Mary-Margaret Hassell Done, FNP

## 2017-11-18 NOTE — Telephone Encounter (Signed)
Seen yesterday   This med not on list 

## 2017-11-19 ENCOUNTER — Telehealth: Payer: Self-pay | Admitting: Nurse Practitioner

## 2017-11-19 DIAGNOSIS — G5691 Unspecified mononeuropathy of right upper limb: Secondary | ICD-10-CM

## 2017-11-19 MED ORDER — FENTANYL 75 MCG/HR TD PT72: 75.0000 ug | MEDICATED_PATCH | TRANSDERMAL | 0 refills | Status: DC

## 2017-11-19 NOTE — Telephone Encounter (Signed)
Will send in one month of her Fentanyl. MMM can fill next two months when she returns. Pt was reviewed in [place] controlled database and has had this rx filled since 03/2017.

## 2017-11-19 NOTE — Telephone Encounter (Signed)
Pt aware.

## 2017-12-14 ENCOUNTER — Other Ambulatory Visit: Payer: Self-pay | Admitting: Pediatrics

## 2017-12-16 NOTE — Telephone Encounter (Signed)
Last seen 11/17/17  MMM 

## 2017-12-18 ENCOUNTER — Other Ambulatory Visit: Payer: Self-pay | Admitting: Nurse Practitioner

## 2017-12-18 NOTE — Telephone Encounter (Signed)
Patient has to be seen yo get refill- office policy

## 2017-12-18 NOTE — Telephone Encounter (Signed)
Patient needs refill of her Fentanyl patches, last seen 11/17/17.  She is aware you are out of the office until Monday.

## 2017-12-21 NOTE — Telephone Encounter (Signed)
Appointment scheduled for 12/23/17 at 8:00 am

## 2017-12-23 ENCOUNTER — Ambulatory Visit: Payer: Medicaid Other | Admitting: Nurse Practitioner

## 2017-12-23 ENCOUNTER — Encounter: Payer: Self-pay | Admitting: Nurse Practitioner

## 2017-12-23 DIAGNOSIS — G5691 Unspecified mononeuropathy of right upper limb: Secondary | ICD-10-CM | POA: Diagnosis not present

## 2017-12-23 MED ORDER — FENTANYL 75 MCG/HR TD PT72: 75.0000 ug | MEDICATED_PATCH | TRANSDERMAL | 0 refills | Status: DC

## 2017-12-23 NOTE — Progress Notes (Signed)
Subjective:    Patient ID: Angela Michael, female    DOB: June 23, 1960, 57 y.o.   MRN: [id number]   Chief Complaint: pain management  HPI:  Patient comes in today for pain management. She was seen for management of chronic issues on 11/17/17. No changes were made to hr plan of care that day. Patches ran out Saturday and refill was denied.  Pain assessment: Cause of pain- neuropathy of right arm Pain location- mainly in right shoulder and radiates down right  arm Pain on scale of 1-10- 8-9/10 Frequency- daily What increases pain-movement What makes pain Better-rest and fentanyl patches Effects on ADL - stilll able to get done what she needs to. Any change in general medical condition-none  Current medications- fentanyl 66mcg patch Effectiveness of current meds-brings pain down to 4/10 Adverse reactions form pain meds-none Morphine equivalent- 180  Pill count performed-No [place] drug screen- yes Was the [place] reviewed- yes  If yes were their any concerning findings? - no  Pain contract signed on:   Outpatient Encounter Medications as of 12/23/2017  Medication Sig  . acetaminophen (TYLENOL) 500 MG tablet Take 1,000 mg every 6 (six) hours as needed by mouth for moderate pain or headache.  Marland Kitchen aspirin 81 MG tablet Take 81 mg by mouth daily. Reported on 06/21/2015  . atorvastatin (LIPITOR) 40 MG tablet Take 1 tablet (40 mg total) by mouth at bedtime.  . cyanocobalamin (,VITAMIN B-12,) 1000 MCG/ML injection Inject 1 mL (1,000 mcg total) into the muscle every 30 (thirty) days.  . cyclobenzaprine (FLEXERIL) 5 MG tablet TAKE 1 TABLET BY MOUTH THREE TIMES A DAY AS NEEDED FOR MUSCLE SPASMS  . docusate sodium (COLACE) 100 MG capsule Take 1 capsule (100 mg total) by mouth 2 (two) times daily. (Patient taking differently: Take 100 mg 2 (two) times daily as needed by mouth for mild constipation. )  . Fe Fum-FePoly-Vit C-Vit B3 (INTEGRA) 62.5-62.5-40-3 MG CAPS Take 1 tablet by mouth daily.  .  fentaNYL (DURAGESIC) 75 MCG/HR Place 1 patch (75 mcg total) onto the skin every 3 (three) days.  Marland Kitchen FLUoxetine (PROZAC) 40 MG capsule Take 2 capsules (80 mg total) by mouth daily.  . fluticasone (FLONASE) 50 MCG/ACT nasal spray PLACE 2 SPRAYS INTO THE NOSE AS NEEDED. FOR ALLERGIES  . furosemide (LASIX) 40 MG tablet TAKE 1 TABLET (40 MG TOTAL) BY MOUTH DAILY.  Marland Kitchen gabapentin (NEURONTIN) 300 MG capsule TAKE 2 CAPSULE BY MOUTH TID  . levETIRAcetam (KEPPRA) 500 MG tablet TAKE 2 TABLETS BY MOUTH 2 TIMES DAILY  . loratadine (CLARITIN) 10 MG tablet Take 10 mg daily as needed by mouth for allergies.   . meloxicam (MOBIC) 15 MG tablet TAKE 1 TABLET (15 MG TOTAL) BY MOUTH DAILY FOR 30 DAYS.  Marland Kitchen Multiple Vitamin (MULTIVITAMIN WITH MINERALS) TABS Take 1 tablet by mouth daily. Reported on 06/21/2015  . nitroGLYCERIN (NITROSTAT) 0.4 MG SL tablet Place 1 tablet (0.4 mg total) under the tongue every 5 (five) minutes as needed for chest pain.  . polyethylene glycol powder (GLYCOLAX/MIRALAX) powder Take 17 g by mouth daily as needed. (Patient taking differently: Take 17 g daily as needed by mouth for moderate constipation. )  . potassium chloride SA (K-DUR,KLOR-CON) 20 MEQ tablet Take 1 tablet (20 mEq total) by mouth 2 (two) times daily.  . traZODone (DESYREL) 50 MG tablet TAKE 1 TO 3 TABLETS AT BEDTIME       New complaints: None today  Social history: Lives with husband. Daughter and grand daughter  live with him    Review of Systems  Constitutional: Negative for activity change and appetite change.  HENT: Negative.   Eyes: Negative for pain.  Respiratory: Negative for shortness of breath.   Cardiovascular: Negative for chest pain, palpitations and leg swelling.  Gastrointestinal: Negative for abdominal pain.  Endocrine: Negative for polydipsia.  Genitourinary: Negative.   Skin: Negative for rash.  Neurological: Negative for dizziness, weakness and headaches.  Hematological: Does not bruise/bleed  easily.  Psychiatric/Behavioral: Negative.   All other systems reviewed and are negative.      Objective:   Physical Exam  Constitutional: She is oriented to person, place, and time. She appears well-developed and well-nourished. She appears distressed (mild).  Cardiovascular: Normal rate and regular rhythm.  Pulmonary/Chest: Effort normal.  Musculoskeletal:  FROM of right shoulder with pain on abduction and internal rotation  Lymphadenopathy:    Cervical adenopathy: .  Neurological: She is alert and oriented to person, place, and time.  Skin: Skin is warm and dry.  Psychiatric: She has a normal mood and affect. Her behavior is normal. Thought content normal.   BP 112/64   Pulse (!) 42   Temp (!) 96.9 F (36.1 C) (Oral)   Ht 5\' 1"  (1.549 m)   Wt 200 lb 9.6 oz (91 kg)   BMI 37.90 kg/m       Assessment & Plan:  Angela Michael in today with chief complaint of pain management   1. Neuropathy of upper extremity, right Stretches discussed toxassure done today - fentaNYL (DURAGESIC) 75 MCG/HR; Place 1 patch (75 mcg total) onto the skin every 3 (three) days.  Dispense: 10 patch; Refill: 0 - fentaNYL (DURAGESIC) 75 MCG/HR; Place 1 patch (75 mcg total) onto the skin every 3 (three) days.  Dispense: 10 patch; Refill: 0 - fentaNYL (DURAGESIC) 75 MCG/HR; Place 1 patch (75 mcg total) onto the skin every 3 (three) days.  Dispense: 10 patch; Refill: 0  Mary-Margaret Hassell Done, FNP

## 2017-12-23 NOTE — Patient Instructions (Signed)
Opioid Pain Medicine Information Opioids are powerful medicines that are used to treat moderate to severe pain. Opioids should be taken with the supervision of a trained health care provider. They should be taken for the shortest period of time as possible. This is because opioids can be addictive and the longer you take opioids, the greater your risk of addiction (opioid use disorder). What do opioids do? Opioids help to reduce or eliminate pain. When used for short periods of time, they can help you:  Sleep better.  Do better in physical or occupational therapy.  Feel better in the first few days after an injury.  Recover from surgery.  What is a pain treatment plan? A pain treatment plan is an agreement between you and your health care provider. Pain is unique to each person, and treatments vary depending on your condition. To manage your pain successfully, you and your health care provider need to understand each other and work together. To help you do this:  Discuss the goals of your treatment, including how much pain you might expect to have and how you will manage the pain.  Review the risks and benefits of taking opioid medicines for your condition.  Remember that a good treatment plan uses more than one approach and minimizes the chance of side effects.  Be honest about the amount of medicines you take, and about any drug or alcohol use.  Get pain medicine prescriptions from only one care provider.  Keep all follow-up visits as told by your health care provider. This is important.  What instructions should I follow while taking opioid pain medicine? While you are taking the medicine and for 8 hours after you stop taking the medicine, follow these instructions:  Do not drive.  Do not use machinery or power tools.  Do not sign legal documents.  Do not drink alcohol.  Do not take sleeping pills.  Do not supervise children by yourself.  Do not participate in activities  that require climbing or being in high places.  Do not enter a body of water-such as a lake, river, ocean, spa, or swimming pool-unless an adult is nearby who can monitor and help you.  What kinds of side effects can opioids cause? Opioids can cause side effects, such as:  Constipation.  Nausea.  Vomiting.  Drowsiness.  Confusion.  Opioid use disorder.  Breathing difficulties (respiratory depression).  Using opioid pain medicines for longer than 3 days increases your risk of these side effects. Taking opioid pain medicine for a long period of time can affect your ability to do daily tasks. It also puts you at risk for:  Motor vehicle accidents.  Depression.  Suicide.  Heart attack.  Overdose, which can sometimes lead to death.  What are alternative ways to manage pain? Pain can be managed with many types of alternative treatments. Ask your health care provider to refer you to one or more specialists who can help you manage pain through:  Physical or occupational therapy.  Counseling (cognitive behavioral therapy).  Good nutrition.  Biofeedback.  Massage.  Meditation.  Non-opioid medicine.  Following a gentle exercise program.  How can I keep others safe while I am taking opioid pain medicine?  Keep pain medicine in a locked cabinet, or in a secure area where children cannot reach it.  Never share your pain medicine with anyone.  Do not save any leftover pills. If you have leftover medicine, you can: 1. Bring the medicine to a prescription take-back program.   This is usually offered by the county or law enforcement. 2. Throw it out in the trash. To do this:  Mix the medicine with undesirable trash such as pet waste or food.  Put the mixture in a sealed container or plastic bag.  Throw it in the trash.  Destroy any personal information on the prescription bottle. How do I stop taking opioids if I have been taking them for a long time? If you have  been taking opioid medicine for more than a few weeks, you may need to slowly decrease (taper) how much you take until you stop completely. Tapering your use of opioids can decrease your chances of experiencing withdrawal symptoms, such as:  Pain and cramping in the abdomen.  Nausea.  Sweating.  Sleepiness.  Restlessness.  Uncontrollable shaking (tremors).  Cravings for the medicine.  Do not attempt to taper your use of opioids on your own. Talk with your health care provider about how to do this. Your health care provider may prescribe a step-down schedule based on how much medicine you are taking and how long you have been taking it. Where to find support: If you have been taking opioids for a long time, you may benefit from receiving support for quitting from a local support group or counselor. Ask your health care provider for a referral to these resources in your area. Where to find more information:  Centers for Disease Control and Prevention (CDC): www.cdc.gov/drugoverdose/opioids/index.html Get help right away if: Seek medical care right away if you are taking opioids and you (or people close to you) notice any of the following:  Difficulty breathing.  Breathing that is slower or more shallow than normal.  A very slow heartbeat (pulse).  Severe confusion.  Unconsciousness.  Sleepiness.  Slurred speech.  Nausea and vomiting.  Cold, clammy skin.  Blue lips or fingernails.  Limpness.  Abnormally small pupils.  If you think that you or someone else may have taken too much of an opioid medicine, get medical help right away. Do not wait to see if the symptoms go away on their own.  If you ever feel like you may hurt yourself or others, or have thoughts about taking your own life, get help right away. You can go to your nearest emergency department or call:  Your local emergency services (911 in the [place]  The hotline of the National Poison Control Center  (1-800-222-1222 in the [place]  A suicide crisis helpline, such as the National Suicide Prevention Lifeline at 1-800-273-8255. This is open 24 hours a day.  Summary  Opioid medicines can help you manage moderate to severe pain for a short period of time.  Discuss the goals of your treatment with your health care provider, including how much pain you might expect to have and how you will manage the pain.  A good treatment plan uses more than one approach. Pain can be managed with many types of alternative treatments.  If you think that you or someone else may have taken too much of an opioid, get medical help right away. This information is not intended to replace advice given to you by your health care provider. Make sure you discuss any questions you have with your health care provider. Document Released: 04/27/2015 Document Revised: 07/18/2016 Document Reviewed: 11/10/2014 Elsevier Interactive Patient Education  2018 [employer]  

## 2017-12-29 LAB — TOXASSURE SELECT 13 (MW), URINE

## 2018-01-17 ENCOUNTER — Other Ambulatory Visit: Payer: Self-pay | Admitting: Family Medicine

## 2018-01-18 NOTE — Telephone Encounter (Signed)
Last seen 12/23/17  MMM

## 2018-02-17 ENCOUNTER — Ambulatory Visit: Payer: Medicaid Other | Admitting: Nurse Practitioner

## 2018-02-17 ENCOUNTER — Encounter: Payer: Self-pay | Admitting: Nurse Practitioner

## 2018-02-17 VITALS — BP 123/73 | HR 60 | Temp 97.6°F

## 2018-02-17 DIAGNOSIS — D509 Iron deficiency anemia, unspecified: Secondary | ICD-10-CM

## 2018-02-17 DIAGNOSIS — I8393 Asymptomatic varicose veins of bilateral lower extremities: Secondary | ICD-10-CM | POA: Diagnosis not present

## 2018-02-17 DIAGNOSIS — D61818 Other pancytopenia: Secondary | ICD-10-CM | POA: Diagnosis not present

## 2018-02-17 DIAGNOSIS — G5691 Unspecified mononeuropathy of right upper limb: Secondary | ICD-10-CM

## 2018-02-17 DIAGNOSIS — E876 Hypokalemia: Secondary | ICD-10-CM

## 2018-02-17 DIAGNOSIS — Z23 Encounter for immunization: Secondary | ICD-10-CM | POA: Diagnosis not present

## 2018-02-17 DIAGNOSIS — E785 Hyperlipidemia, unspecified: Secondary | ICD-10-CM

## 2018-02-17 DIAGNOSIS — F3342 Major depressive disorder, recurrent, in full remission: Secondary | ICD-10-CM

## 2018-02-17 DIAGNOSIS — I471 Supraventricular tachycardia: Secondary | ICD-10-CM

## 2018-02-17 DIAGNOSIS — C8590 Non-Hodgkin lymphoma, unspecified, unspecified site: Secondary | ICD-10-CM

## 2018-02-17 DIAGNOSIS — S42401D Unspecified fracture of lower end of right humerus, subsequent encounter for fracture with routine healing: Secondary | ICD-10-CM

## 2018-02-17 DIAGNOSIS — R569 Unspecified convulsions: Secondary | ICD-10-CM

## 2018-02-17 DIAGNOSIS — R609 Edema, unspecified: Secondary | ICD-10-CM

## 2018-02-17 DIAGNOSIS — F5101 Primary insomnia: Secondary | ICD-10-CM

## 2018-02-17 DIAGNOSIS — G894 Chronic pain syndrome: Secondary | ICD-10-CM

## 2018-02-17 MED ORDER — FENTANYL 75 MCG/HR TD PT72: 75.0000 ug | MEDICATED_PATCH | TRANSDERMAL | 0 refills | Status: DC

## 2018-02-17 MED ORDER — POTASSIUM CHLORIDE CRYS ER 20 MEQ PO TBCR: 20.0000 meq | EXTENDED_RELEASE_TABLET | Freq: Two times a day (BID) | ORAL | 5 refills | Status: DC

## 2018-02-17 MED ORDER — INTEGRA 62.5-62.5-40-3 MG PO CAPS: 1.0000 | ORAL_CAPSULE | Freq: Every day | ORAL | 1 refills | Status: DC

## 2018-02-17 MED ORDER — FUROSEMIDE 40 MG PO TABS: ORAL_TABLET | ORAL | 5 refills | Status: DC

## 2018-02-17 MED ORDER — GABAPENTIN 300 MG PO CAPS: ORAL_CAPSULE | ORAL | 5 refills | Status: DC

## 2018-02-17 MED ORDER — TRAZODONE HCL 50 MG PO TABS
ORAL_TABLET | ORAL | 1 refills | Status: DC
Start: 2018-02-17 — End: 2018-05-20

## 2018-02-17 MED ORDER — FLUOXETINE HCL 40 MG PO CAPS: 80.0000 mg | ORAL_CAPSULE | Freq: Every day | ORAL | 5 refills | Status: DC

## 2018-02-17 MED ORDER — ATORVASTATIN CALCIUM 40 MG PO TABS: 40.0000 mg | ORAL_TABLET | Freq: Every day | ORAL | 5 refills | Status: DC

## 2018-02-17 MED ORDER — LEVETIRACETAM 500 MG PO TABS
ORAL_TABLET | ORAL | 5 refills | Status: DC
Start: 2018-02-17 — End: 2018-05-20

## 2018-02-17 NOTE — Progress Notes (Signed)
Subjective:    Patient ID: Angela Michael, female    DOB: 1960-11-09, 57 y.o.   MRN: [id number]   Chief Complaint: Medical Management of Chronic Issues   HPI:  1. SVT (supraventricular tachycardia) ([place])  Patient denies any recent runs of SVT. No palpitations  2. Asymptomatic varicose veins of both lower extremities  Has large varicose veins in bil lower ext but they usually do not bother her.  3. Neuropathy of right upper extremity  She has chronic pain right upper arm. Is on fentanyl patch but can have no othe rpain meds because of overdose at the end of last year.  4. Pancytopenia ([place])  Need  To repeat labs today.  5. Chronic pain syndrome  As stated previous is on fentanyl patch. Pain assessment: Cause of pain- right arm damage Pain location- all over but is worse in right shoulder Pain on scale of 1-10- 8/10 currently Frequency- daily What increases pain-movement What makes pain Better-rest Effects on ADL - does what she needs to Any change in general medical condition-no  Current medications- fentanyk 7mcg patch Effectiveness of current meds-helps Adverse reactions form pain meds-none Morphine equivalent- >30  Pill count performed-No Urine drug screen- Yes Was the [place] reviewed- yes  If yes were their any concerning findings? - none  Pain contract signed on: 02/17/18   6. Peripheral edema  Has edema when she is on her feet a lot. Doe snot wear compression hose.  7. Hyperlipidemia with target LDL less than 100  Does not watch diet and does no exercise  8. Hypokalemia  No c/o lower ext cramping  9. Morbid obesity ([place])  No recent weight changes. Has had gastric bypass surgery  In past.  10. Seizures ([place])  No recent seizures that she is awareof.  11. Non-Hodgkin's lymphoma, unspecified body region, unspecified non-Hodgkin lymphoma type [facility]) is currently in remission. Goes to oncology frequently but is more for her blood.    Outpatient Encounter  Medications as of 02/17/2018  Medication Sig  . acetaminophen (TYLENOL) 500 MG tablet Take 1,000 mg every 6 (six) hours as needed by mouth for moderate pain or headache.  Marland Kitchen aspirin 81 MG tablet Take 81 mg by mouth daily. Reported on 06/21/2015  . atorvastatin (LIPITOR) 40 MG tablet Take 1 tablet (40 mg total) by mouth at bedtime.  . cyanocobalamin (,VITAMIN B-12,) 1000 MCG/ML injection Inject 1 mL (1,000 mcg total) into the muscle every 30 (thirty) days.  . cyclobenzaprine (FLEXERIL) 5 MG tablet TAKE 1 TABLET BY MOUTH THREE TIMES A DAY AS NEEDED FOR MUSCLE SPASMS  . docusate sodium (COLACE) 100 MG capsule Take 1 capsule (100 mg total) by mouth 2 (two) times daily. (Patient taking differently: Take 100 mg 2 (two) times daily as needed by mouth for mild constipation. )  . Fe Fum-FePoly-Vit C-Vit B3 (INTEGRA) 62.5-62.5-40-3 MG CAPS Take 1 tablet by mouth daily.  Derrill Memo ON 02/21/2018] fentaNYL (DURAGESIC) 75 MCG/HR Place 1 patch (75 mcg total) onto the skin every 3 (three) days.  . fentaNYL (DURAGESIC) 75 MCG/HR Place 1 patch (75 mcg total) onto the skin every 3 (three) days.  Marland Kitchen FLUoxetine (PROZAC) 40 MG capsule Take 2 capsules (80 mg total) by mouth daily.  . fluticasone (FLONASE) 50 MCG/ACT nasal spray PLACE 2 SPRAYS INTO THE NOSE AS NEEDED. FOR ALLERGIES  . furosemide (LASIX) 40 MG tablet TAKE 1 TABLET (40 MG TOTAL) BY MOUTH DAILY.  Marland Kitchen gabapentin (NEURONTIN) 300 MG capsule TAKE 2 CAPSULE BY MOUTH  TID  . levETIRAcetam (KEPPRA) 500 MG tablet TAKE 2 TABLETS BY MOUTH 2 TIMES DAILY  . loratadine (CLARITIN) 10 MG tablet Take 10 mg daily as needed by mouth for allergies.   . meloxicam (MOBIC) 15 MG tablet TAKE 1 TABLET (15 MG TOTAL) BY MOUTH DAILY FOR 30 DAYS.  Marland Kitchen Multiple Vitamin (MULTIVITAMIN WITH MINERALS) TABS Take 1 tablet by mouth daily. Reported on 06/21/2015  . nitroGLYCERIN (NITROSTAT) 0.4 MG SL tablet Place 1 tablet (0.4 mg total) under the tongue every 5 (five) minutes as needed for chest pain.  .  polyethylene glycol powder (GLYCOLAX/MIRALAX) powder Take 17 g by mouth daily as needed. (Patient taking differently: Take 17 g daily as needed by mouth for moderate constipation. )  . potassium chloride SA (K-DUR,KLOR-CON) 20 MEQ tablet Take 1 tablet (20 mEq total) by mouth 2 (two) times daily.  . traZODone (DESYREL) 50 MG tablet TAKE 1 TO 3 TABLETS AT BEDTIME       New complaints: [place] and fell. Has right elbow fracture, 2 rib fractures and injured her right knee. Went to ER. Has long arm cast on and a brace on right knee. She has tried to get appointment with ortho but they cannot see her for 3 months. Wants to see dr. Kimberlee Nearing  Social history: Lives with husband- daughter lives with them and she is helping raise granddaughter    Review of Systems  Constitutional: Negative for activity change and appetite change.  HENT: Negative.   Eyes: Negative for pain.  Respiratory: Negative for shortness of breath.   Cardiovascular: Negative for chest pain, palpitations and leg swelling.  Gastrointestinal: Negative for abdominal pain.  Endocrine: Negative for polydipsia.  Genitourinary: Negative.   Musculoskeletal:       Right elbow in long arm cast  Skin: Negative for rash.  Neurological: Negative for dizziness, weakness and headaches.  Hematological: Does not bruise/bleed easily.  Psychiatric/Behavioral: Negative.   All other systems reviewed and are negative.      Objective:   Physical Exam  Constitutional: She is oriented to person, place, and time. She appears well-developed and well-nourished. No distress.  HENT:  Head: Normocephalic.  Nose: Nose normal.  Mouth/Throat: Oropharynx is clear and moist.  Eyes: Pupils are equal, round, and reactive to light. EOM are normal.  Neck: Normal range of motion. Neck supple. No JVD present. Carotid bruit is not present.  Cardiovascular: Normal rate, regular rhythm, normal heart sounds and  intact distal pulses.  Pulmonary/Chest: Effort normal and breath sounds normal. No respiratory distress. She has no wheezes. She has no rales. She exhibits no tenderness.  Abdominal: Soft. Normal appearance, normal aorta and bowel sounds are normal. She exhibits no distension, no abdominal bruit, no pulsatile midline mass and no mass. There is no splenomegaly or hepatomegaly. There is no tenderness.  Musculoskeletal: Normal range of motion. She exhibits no edema.  Long arm cast right arm- brisk cap refill. senesation of fingers intact Right knee in knee brace- was not removed to test movement  Lymphadenopathy:    She has no cervical adenopathy.  Neurological: She is alert and oriented to person, place, and time. She has normal reflexes.  Skin: Skin is warm and dry.  Psychiatric: She has a normal mood and affect. Her behavior is normal. Judgment and thought content normal.  Nursing note and vitals reviewed.  BP 123/73   Pulse 60   Temp 97.6 F (36.4 C) (Oral)  Assessment & Plan:  Angela Michael comes in today with chief complaint of Medical Management of Chronic Issues   Diagnosis and orders addressed:  1. SVT (supraventricular tachycardia) ([place]) Keep follow up with cardiology Keep diary of episodes when occur  2. Asymptomatic varicose veins of both lower extremities Wear compression socks  3. Neuropathy of right upper extremity  4. Pancytopenia ([place]) Labs pending  5. Chronic pain syndrome Refilled fentanyl patches  6. Peripheral edema Elevate legs when sitting - furosemide (LASIX) 40 MG tablet; TAKE 1 TABLET (40 MG TOTAL) BY MOUTH DAILY.  Dispense: 30 tablet; Refill: 5  7. Hyperlipidemia with target LDL less than 100 Low fat diet - atorvastatin (LIPITOR) 40 MG tablet; Take 1 tablet (40 mg total) by mouth at bedtime.  Dispense: 30 tablet; Refill: 5  8. Hypokalemia - potassium chloride SA (K-DUR,KLOR-CON) 20 MEQ tablet; Take 1 tablet (20 mEq total) by mouth 2  (two) times daily.  Dispense: 30 tablet; Refill: 5  9. Morbid obesity ([place]) Discussed diet and exercise for person with BMI >25 Will recheck weight in 3-6 months  10. Seizures (HCC) - levETIRAcetam (KEPPRA) 500 MG tablet; TAKE 2 TABLETS BY MOUTH 2 TIMES DAILY  Dispense: 120 tablet; Refill: 5  11. Non-Hodgkin's lymphoma, unspecified body region, unspecified non-Hodgkin lymphoma type ([place])  12. Closed fracture dislocation of right elbow with routine healing, subsequent encounter Referral to ortho - Ambulatory referral to Orthopedic Surgery  13. Iron deficiency anemia, unspecified iron deficiency anemia type Labs pending - Fe Fum-FePoly-Vit C-Vit B3 (INTEGRA) 62.5-62.5-40-3 MG CAPS; Take 1 tablet by mouth daily.  Dispense: 90 capsule; Refill: 1  14. Neuropathy of upper extremity, right - gabapentin (NEURONTIN) 300 MG capsule; TAKE 2 CAPSULE BY MOUTH TID  Dispense: 180 capsule; Refill: 5 - fentaNYL (DURAGESIC) 75 MCG/HR; Place 1 patch (75 mcg total) onto the skin every 3 (three) days.  Dispense: 10 patch; Refill: 0 - fentaNYL (DURAGESIC) 75 MCG/HR; Place 1 patch (75 mcg total) onto the skin every 3 (three) days.  Dispense: 10 patch; Refill: 0  15. Primary insomnia Bedtime routine - traZODone (DESYREL) 50 MG tablet; TAKE 1 TO 3 TABLETS AT BEDTIME  Dispense: 270 tablet; Refill: 1  16. Recurrent major depressive disorder, in full remission ([place]) Stress management - FLUoxetine (PROZAC) 40 MG capsule; Take 2 capsules (80 mg total) by mouth daily.  Dispense: 60 capsule; Refill: 5   Labs pending Health Maintenance reviewed Diet and exercise encouraged  Follow up plan: 3 months   Mary-Margaret Hassell Done, FNP

## 2018-02-20 ENCOUNTER — Other Ambulatory Visit: Payer: Self-pay | Admitting: Nurse Practitioner

## 2018-03-16 ENCOUNTER — Encounter: Payer: Self-pay | Admitting: Nurse Practitioner

## 2018-03-16 ENCOUNTER — Ambulatory Visit: Payer: Medicaid Other | Admitting: Nurse Practitioner

## 2018-03-16 VITALS — BP 142/91 | HR 82 | Temp 96.8°F | Ht 61.0 in

## 2018-03-16 DIAGNOSIS — N3000 Acute cystitis without hematuria: Secondary | ICD-10-CM | POA: Diagnosis not present

## 2018-03-16 DIAGNOSIS — R300 Dysuria: Secondary | ICD-10-CM | POA: Diagnosis not present

## 2018-03-16 LAB — URINALYSIS, COMPLETE
Bilirubin, UA: NEGATIVE
Glucose, UA: NEGATIVE
Nitrite, UA: POSITIVE — AB
PH UA: 5 (ref 5.0–7.5)
Specific Gravity, UA: >1.03 — ABNORMAL HIGH (ref 1.005–1.030)
UUROB: 2 mg/dL — AB (ref 0.2–1.0)

## 2018-03-16 LAB — MICROSCOPIC EXAMINATION

## 2018-03-16 MED ORDER — CIPROFLOXACIN HCL 500 MG PO TABS: 500.0000 mg | ORAL_TABLET | Freq: Two times a day (BID) | ORAL | 0 refills | Status: DC

## 2018-03-16 NOTE — Progress Notes (Signed)
   Subjective:    Patient ID: Angela Michael, female    DOB: Apr 07, 1961, 57 y.o.   MRN: [id number]   Chief Complaint: Dysuria   HPI Patient comes in today c/o dysuria. started about 2 days ago and has gotten worse. She has voided very little today.   Review of Systems  Constitutional: Negative.   HENT: Negative.   Respiratory: Negative.   Cardiovascular: Negative.   Genitourinary: Positive for dysuria, frequency and urgency.  Neurological: Negative.   Psychiatric/Behavioral: Negative.   All other systems reviewed and are negative.      Objective:   Physical Exam  Constitutional: She is oriented to person, place, and time. She appears well-developed and well-nourished. No distress.  Cardiovascular: Normal rate and regular rhythm.  Pulmonary/Chest: Effort normal.  Abdominal: Soft.  Genitourinary:  Genitourinary Comments: No CVA tenderness  Neurological: She is alert and oriented to person, place, and time.  Skin: Skin is warm.  Psychiatric: She has a normal mood and affect. Her behavior is normal. Thought content normal.    BP (!) 142/91   Pulse 82   Temp (!) 96.8 F (36 C) (Oral)   Ht 5\' 1"  (1.549 m)   BMI 37.90 kg/m   UA-  Pos nitrite and leuks     Assessment & Plan:  Angela Michael in today with chief complaint of Dysuria   1. Dysuria - Urinalysis, Complete  2. Acute cystitis without hematuria Take medication as prescribe Cotton underwear Take shower not bath Cranberry juice, yogurt Force fluids AZO over the counter X2 days Culture pending RTO prn  Mary-Margaret Hassell Done, FNP  - Urine Culture - ciprofloxacin (CIPRO) 500 MG tablet; Take 1 tablet (500 mg total) by mouth 2 (two) times daily.  Dispense: 10 tablet; Refill: 0

## 2018-03-16 NOTE — Patient Instructions (Signed)

## 2018-03-18 LAB — URINE CULTURE

## 2018-04-03 ENCOUNTER — Other Ambulatory Visit: Payer: Self-pay | Admitting: Nurse Practitioner

## 2018-05-07 ENCOUNTER — Other Ambulatory Visit: Payer: Self-pay | Admitting: Nurse Practitioner

## 2018-05-20 ENCOUNTER — Encounter: Payer: Self-pay | Admitting: Nurse Practitioner

## 2018-05-20 ENCOUNTER — Ambulatory Visit: Payer: Medicaid Other | Admitting: Nurse Practitioner

## 2018-05-20 VITALS — BP 111/73 | HR 71 | Temp 97.2°F | Ht 61.0 in | Wt 200.0 lb

## 2018-05-20 DIAGNOSIS — E876 Hypokalemia: Secondary | ICD-10-CM | POA: Diagnosis not present

## 2018-05-20 DIAGNOSIS — F32A Depression, unspecified: Secondary | ICD-10-CM

## 2018-05-20 DIAGNOSIS — F5101 Primary insomnia: Secondary | ICD-10-CM

## 2018-05-20 DIAGNOSIS — D509 Iron deficiency anemia, unspecified: Secondary | ICD-10-CM

## 2018-05-20 DIAGNOSIS — E785 Hyperlipidemia, unspecified: Secondary | ICD-10-CM

## 2018-05-20 DIAGNOSIS — D696 Thrombocytopenia, unspecified: Secondary | ICD-10-CM | POA: Diagnosis not present

## 2018-05-20 DIAGNOSIS — R569 Unspecified convulsions: Secondary | ICD-10-CM

## 2018-05-20 DIAGNOSIS — F329 Major depressive disorder, single episode, unspecified: Secondary | ICD-10-CM

## 2018-05-20 DIAGNOSIS — C8590 Non-Hodgkin lymphoma, unspecified, unspecified site: Secondary | ICD-10-CM

## 2018-05-20 DIAGNOSIS — G5691 Unspecified mononeuropathy of right upper limb: Secondary | ICD-10-CM

## 2018-05-20 DIAGNOSIS — F3342 Major depressive disorder, recurrent, in full remission: Secondary | ICD-10-CM

## 2018-05-20 DIAGNOSIS — R6510 Systemic inflammatory response syndrome (SIRS) of non-infectious origin without acute organ dysfunction: Secondary | ICD-10-CM

## 2018-05-20 DIAGNOSIS — R609 Edema, unspecified: Secondary | ICD-10-CM

## 2018-05-20 DIAGNOSIS — R600 Localized edema: Secondary | ICD-10-CM

## 2018-05-20 DIAGNOSIS — G894 Chronic pain syndrome: Secondary | ICD-10-CM

## 2018-05-20 DIAGNOSIS — G4700 Insomnia, unspecified: Secondary | ICD-10-CM

## 2018-05-20 MED ORDER — FLUOXETINE HCL 40 MG PO CAPS: 80.0000 mg | ORAL_CAPSULE | Freq: Every day | ORAL | 5 refills | Status: DC

## 2018-05-20 MED ORDER — FENTANYL 75 MCG/HR TD PT72: 1.0000 | MEDICATED_PATCH | TRANSDERMAL | 0 refills | Status: DC

## 2018-05-20 MED ORDER — FUROSEMIDE 40 MG PO TABS: ORAL_TABLET | ORAL | 5 refills | Status: DC

## 2018-05-20 MED ORDER — INTEGRA 62.5-62.5-40-3 MG PO CAPS: 1.0000 | ORAL_CAPSULE | Freq: Every day | ORAL | 1 refills | Status: DC

## 2018-05-20 MED ORDER — LEVETIRACETAM 500 MG PO TABS: ORAL_TABLET | ORAL | 5 refills | Status: DC

## 2018-05-20 MED ORDER — POTASSIUM CHLORIDE CRYS ER 20 MEQ PO TBCR: 20.0000 meq | EXTENDED_RELEASE_TABLET | Freq: Two times a day (BID) | ORAL | 5 refills | Status: DC

## 2018-05-20 MED ORDER — ATORVASTATIN CALCIUM 40 MG PO TABS: 40.0000 mg | ORAL_TABLET | Freq: Every day | ORAL | 5 refills | Status: DC

## 2018-05-20 MED ORDER — TRAZODONE HCL 50 MG PO TABS: ORAL_TABLET | ORAL | 1 refills | Status: DC

## 2018-05-20 MED ORDER — GABAPENTIN 300 MG PO CAPS: ORAL_CAPSULE | ORAL | 5 refills | Status: DC

## 2018-05-20 NOTE — Progress Notes (Signed)
Subjective:    Patient ID: Angela Michael, female    DOB: 1960/07/03, 58 y.o.   MRN: [id number]   Chief Complaint: Medical Management of Chronic Issues   HPI:  1. Hyperlipidemia with target LDL less than 100  Does not watch diet and does no exercise.  2. Hypokalemia  No c/o  Lower ext cramping.  3. Peripheral edema  Has swelling when she is on her feet a lot.  4. Thrombocytopenia (HCC)  Last CBC platelets were 140. She sees oncology every 6 months.  5. SIRS (systemic inflammatory response syndrome) (HCC)  They are really not sure what is going on with her but is currently just being watched- is on o speciality medications.  6. Seizures ([place]) last seizure was last year sometime. Has not seen neurology in years- is still taking keppra daily  7. Morbid obesity ([place])  No recent weight changes  8. Non-Hodgkin's lymphoma, unspecified body region, unspecified non-Hodgkin lymphoma type [facility]) again is seeing oncology every 6 months , says her lymphoma is "at rest".  9. Insomnia, unspecified type  Is on trazodone to sleep. Says she is doing well.  10. Depression, unspecified depression type  Is on prozac daily and works well for her. Depression screen [facility] 2/9 05/20/2018 03/16/2018 02/17/2018  Decreased Interest 0 1 0  Down, Depressed, Hopeless 0 0 0  PHQ - 2 Score 0 1 0  Altered sleeping - - -  Tired, decreased energy - - -  Change in appetite - - -  Feeling bad or failure about yourself  - - -  Trouble concentrating - - -  Moving slowly or fidgety/restless - - -  Suicidal thoughts - - -  PHQ-9 Score - - -  Some recent data might be hidden     11. Chronic pain syndrome  Is still on duragesic patches 61mcg. Says she hurts all day long. Had overdose last year so can no longe rget oxycodone for break through. Last toxasure was normal 12/23/17    Outpatient Encounter Medications as of 05/20/2018  Medication Sig  . acetaminophen (TYLENOL) 500 MG tablet Take 1,000 mg every 6 (six) hours  as needed by mouth for moderate pain or headache.  Marland Kitchen aspirin 81 MG tablet Take 81 mg by mouth daily. Reported on 06/21/2015  . atorvastatin (LIPITOR) 40 MG tablet Take 1 tablet (40 mg total) by mouth at bedtime.  . cyanocobalamin (,VITAMIN B-12,) 1000 MCG/ML injection Inject 1 mL (1,000 mcg total) into the muscle every 30 (thirty) days.  . cyclobenzaprine (FLEXERIL) 5 MG tablet TAKE 1 TABLET BY MOUTH THREE TIMES A DAY AS NEEDED FOR MUSCLE SPASMS  . docusate sodium (COLACE) 100 MG capsule Take 1 capsule (100 mg total) by mouth 2 (two) times daily. (Patient taking differently: Take 100 mg 2 (two) times daily as needed by mouth for mild constipation. )  . Fe Fum-FePoly-Vit C-Vit B3 (INTEGRA) 62.5-62.5-40-3 MG CAPS Take 1 tablet by mouth daily.  . fentaNYL (DURAGESIC) 75 MCG/HR Place 1 patch (75 mcg total) onto the skin every 3 (three) days.  Derrill Memo ON 05/22/2018] fentaNYL (DURAGESIC) 75 MCG/HR Place 1 patch (75 mcg total) onto the skin every 3 (three) days.  Marland Kitchen FLUoxetine (PROZAC) 40 MG capsule Take 2 capsules (80 mg total) by mouth daily.  . fluticasone (FLONASE) 50 MCG/ACT nasal spray PLACE 2 SPRAYS INTO THE NOSE AS NEEDED. FOR ALLERGIES  . furosemide (LASIX) 40 MG tablet TAKE 1 TABLET (40 MG TOTAL) BY MOUTH DAILY.  Marland Kitchen  gabapentin (NEURONTIN) 300 MG capsule TAKE 2 CAPSULE BY MOUTH TID  . levETIRAcetam (KEPPRA) 500 MG tablet TAKE 2 TABLETS BY MOUTH 2 TIMES DAILY  . loratadine (CLARITIN) 10 MG tablet Take 10 mg daily as needed by mouth for allergies.   . meloxicam (MOBIC) 15 MG tablet TAKE 1 TABLET BY MOUTH EVERY DAY  . Multiple Vitamin (MULTIVITAMIN WITH MINERALS) TABS Take 1 tablet by mouth daily. Reported on 06/21/2015  . nitroGLYCERIN (NITROSTAT) 0.4 MG SL tablet Place 1 tablet (0.4 mg total) under the tongue every 5 (five) minutes as needed for chest pain.  . polyethylene glycol powder (GLYCOLAX/MIRALAX) powder Take 17 g by mouth daily as needed. (Patient taking differently: Take 17 g daily as needed by  mouth for moderate constipation. )  . potassium chloride SA (K-DUR,KLOR-CON) 20 MEQ tablet Take 1 tablet (20 mEq total) by mouth 2 (two) times daily.  . traZODone (DESYREL) 50 MG tablet TAKE 1 TO 3 TABLETS AT BEDTIME  . fentaNYL (DURAGESIC) 75 MCG/HR Place 1 patch (75 mcg total) onto the skin every 3 (three) days.  . [DISCONTINUED] ciprofloxacin (CIPRO) 500 MG tablet Take 1 tablet (500 mg total) by mouth 2 (two) times daily.      New complaints: None today  Social history: Lives with her husband, her daughter and her 2 granddaughters. Her mom has lung cancer   Review of Systems  Constitutional: Negative for activity change and appetite change.  HENT: Negative.   Eyes: Negative for pain.  Respiratory: Negative for shortness of breath.   Cardiovascular: Negative for chest pain, palpitations and leg swelling.  Gastrointestinal: Negative for abdominal pain.  Endocrine: Negative for polydipsia.  Genitourinary: Negative.   Skin: Negative for rash.  Neurological: Negative for dizziness, weakness and headaches.  Hematological: Does not bruise/bleed easily.  Psychiatric/Behavioral: Negative.   All other systems reviewed and are negative.      Objective:   Physical Exam Vitals signs and nursing note reviewed.  Constitutional:      General: She is not in acute distress.    Appearance: Normal appearance. She is well-developed.  HENT:     Head: Normocephalic.     Nose: Nose normal.  Eyes:     Pupils: Pupils are equal, round, and reactive to light.  Neck:     Musculoskeletal: Normal range of motion and neck supple.     Vascular: No carotid bruit or JVD.  Cardiovascular:     Rate and Rhythm: Normal rate and regular rhythm.     Heart sounds: Normal heart sounds.  Pulmonary:     Effort: Pulmonary effort is normal. No respiratory distress.     Breath sounds: Normal breath sounds. No wheezing or rales.  Chest:     Chest wall: No tenderness.  Abdominal:     General: Bowel sounds  are normal. There is no distension or abdominal bruit.     Palpations: Abdomen is soft. There is no hepatomegaly, splenomegaly, mass or pulsatile mass.     Tenderness: There is no abdominal tenderness.  Musculoskeletal: Normal range of motion.  Lymphadenopathy:     Cervical: No cervical adenopathy.  Skin:    General: Skin is warm and dry.  Neurological:     Mental Status: She is alert and oriented to person, place, and time.     Deep Tendon Reflexes: Reflexes are normal and symmetric.  Psychiatric:        Behavior: Behavior normal.        Thought Content: Thought content normal.  Judgment: Judgment normal.    BP 111/73   Pulse 71   Temp (!) 97.2 F (36.2 C) (Oral)   Ht 5\' 1"  (1.549 m)   Wt 200 lb (90.7 kg)   BMI 37.79 kg/m         Assessment & Plan:  Marjarie Irion comes in today with chief complaint of Medical Management of Chronic Issues   Diagnosis and orders addressed:  1. Hyperlipidemia with target LDL less than 100 Low fat diet - atorvastatin (LIPITOR) 40 MG tablet; Take 1 tablet (40 mg total) by mouth at bedtime.  Dispense: 30 tablet; Refill: 5  2. Hypokalemia - potassium chloride SA (K-DUR,KLOR-CON) 20 MEQ tablet; Take 1 tablet (20 mEq total) by mouth 2 (two) times daily.  Dispense: 30 tablet; Refill: 5  3. Peripheral edema Elevate legs when sitting - furosemide (LASIX) 40 MG tablet; TAKE 1 TABLET (40 MG TOTAL) BY MOUTH DAILY.  Dispense: 30 tablet; Refill: 5  4. Thrombocytopenia ([place]) Labs pending  5. SIRS (systemic inflammatory response syndrome) (HCC)  6. Seizures ([place]) Needs  To follow up with neurology - levETIRAcetam (KEPPRA) 500 MG tablet; TAKE 2 TABLETS BY MOUTH 2 TIMES DAILY  Dispense: 120 tablet; Refill: 5  7. Morbid obesity ([place]) Discussed diet and exercise for person with BMI >25 Will recheck weight in 3-6 months  8. Non-Hodgkin's lymphoma, unspecified body region, unspecified non-Hodgkin lymphoma type ([place])  9. Insomnia,  unspecified type Bedtime routine  10. Depression, unspecified depression type Stress management  11. Chronic pain syndrome  12. Iron deficiency anemia, unspecified iron deficiency anemia type - Fe Fum-FePoly-Vit C-Vit B3 (INTEGRA) 62.5-62.5-40-3 MG CAPS; Take 1 tablet by mouth daily.  Dispense: 90 capsule; Refill: 1  13. Neuropathy of upper extremity, right - fentaNYL (DURAGESIC) 75 MCG/HR; Place 1 patch onto the skin every 3 (three) days for 30 days.  Dispense: 10 patch; Refill: 0 - fentaNYL (DURAGESIC) 75 MCG/HR; Place 1 patch onto the skin every 3 (three) days for 30 days.  Dispense: 10 patch; Refill: 0 - gabapentin (NEURONTIN) 300 MG capsule; TAKE 2 CAPSULE BY MOUTH TID  Dispense: 180 capsule; Refill: 5  14. Primary insomnia - traZODone (DESYREL) 50 MG tablet; TAKE 1 TO 3 TABLETS AT BEDTIME  Dispense: 270 tablet; Refill: 1  15. Recurrent major depressive disorder, in full remission (HCC)  - FLUoxetine (PROZAC) 40 MG capsule; Take 2 capsules (80 mg total) by mouth daily.  Dispense: 60 capsule; Refill: 5   Labs pending Health Maintenance reviewed Diet and exercise encouraged  Follow up plan: 3 months   Mary-Margaret Hassell Done, FNP

## 2018-05-20 NOTE — Patient Instructions (Signed)

## 2018-05-20 NOTE — Addendum Note (Signed)
Addended by: Chevis Pretty on: 05/20/2018 10:52 AM   Modules accepted: Orders

## 2018-05-21 ENCOUNTER — Other Ambulatory Visit: Payer: Self-pay | Admitting: Nurse Practitioner

## 2018-05-21 LAB — CBC WITH DIFFERENTIAL/PLATELET
BASOS: 0 %
Basophils Absolute: 0 10*3/uL (ref 0.0–0.2)
EOS (ABSOLUTE): 0.1 10*3/uL (ref 0.0–0.4)
Eos: 1 %
Hematocrit: 36.4 % (ref 34.0–46.6)
Hemoglobin: 12.9 g/dL (ref 11.1–15.9)
IMMATURE GRANS (ABS): 0 10*3/uL (ref 0.0–0.1)
Immature Granulocytes: 0 %
Lymphocytes Absolute: 1.3 10*3/uL (ref 0.7–3.1)
Lymphs: 17 %
MCH: 32.4 pg (ref 26.6–33.0)
MCHC: 35.4 g/dL (ref 31.5–35.7)
MCV: 92 fL (ref 79–97)
Monocytes Absolute: 0.5 10*3/uL (ref 0.1–0.9)
Monocytes: 6 %
NEUTROS PCT: 76 %
Neutrophils Absolute: 5.9 10*3/uL (ref 1.4–7.0)
Platelets: 136 10*3/uL — ABNORMAL LOW (ref 150–450)
RBC: 3.98 x10E6/uL (ref 3.77–5.28)
RDW: 12.6 % (ref 11.7–15.4)
WBC: 7.9 10*3/uL (ref 3.4–10.8)

## 2018-05-21 LAB — CMP14+EGFR
A/G RATIO: 1.9 (ref 1.2–2.2)
ALT: 38 IU/L — ABNORMAL HIGH (ref 0–32)
AST: 24 IU/L (ref 0–40)
Albumin: 4.1 g/dL (ref 3.8–4.9)
Alkaline Phosphatase: 125 IU/L — ABNORMAL HIGH (ref 39–117)
BUN/Creatinine Ratio: 21 (ref 9–23)
BUN: 18 mg/dL (ref 6–24)
Bilirubin Total: 1.4 mg/dL — ABNORMAL HIGH (ref 0.0–1.2)
CALCIUM: 9 mg/dL (ref 8.7–10.2)
CO2: 26 mmol/L (ref 20–29)
Chloride: 96 mmol/L (ref 96–106)
Creatinine, Ser: 0.84 mg/dL (ref 0.57–1.00)
GFR calc Af Amer: 90 mL/min/{1.73_m2} (ref >59)
GFR, EST NON AFRICAN AMERICAN: 78 mL/min/{1.73_m2} (ref >59)
Globulin, Total: 2.2 g/dL (ref 1.5–4.5)
Glucose: 89 mg/dL (ref 65–99)
Potassium: 3.1 mmol/L — ABNORMAL LOW (ref 3.5–5.2)
Sodium: 142 mmol/L (ref 134–144)
Total Protein: 6.3 g/dL (ref 6.0–8.5)

## 2018-05-21 LAB — LIPID PANEL
Chol/HDL Ratio: 2.5 ratio (ref 0.0–4.4)
Cholesterol, Total: 131 mg/dL (ref 100–199)
HDL: 53 mg/dL (ref >39)
LDL Calculated: 59 mg/dL (ref 0–99)
Triglycerides: 95 mg/dL (ref 0–149)
VLDL Cholesterol Cal: 19 mg/dL (ref 5–40)

## 2018-05-21 MED ORDER — INTEGRA F 125-1 MG PO CAPS: 1.0000 | ORAL_CAPSULE | Freq: Every day | ORAL | 1 refills | Status: DC

## 2018-06-06 ENCOUNTER — Other Ambulatory Visit: Payer: Self-pay | Admitting: Nurse Practitioner

## 2018-06-07 NOTE — Telephone Encounter (Signed)
Last seen 05/20/18

## 2018-06-15 ENCOUNTER — Other Ambulatory Visit: Payer: Self-pay | Admitting: Nurse Practitioner

## 2018-06-15 DIAGNOSIS — G5691 Unspecified mononeuropathy of right upper limb: Secondary | ICD-10-CM

## 2018-07-11 ENCOUNTER — Other Ambulatory Visit: Payer: Self-pay | Admitting: Nurse Practitioner

## 2018-07-12 NOTE — Telephone Encounter (Signed)
Last seen 2/20

## 2018-07-22 ENCOUNTER — Telehealth: Payer: Self-pay | Admitting: *Deleted

## 2018-07-26 NOTE — Telephone Encounter (Signed)
Made attempt to contact patient to assist with webex set up with no return call.

## 2018-07-27 ENCOUNTER — Telehealth: Payer: Self-pay | Admitting: *Deleted

## 2018-07-27 ENCOUNTER — Telehealth: Payer: Self-pay | Admitting: Cardiovascular Disease

## 2018-07-27 ENCOUNTER — Other Ambulatory Visit: Payer: Self-pay | Admitting: *Deleted

## 2018-07-27 MED ORDER — NITROGLYCERIN 0.4 MG SL SUBL: 0.4000 mg | SUBLINGUAL_TABLET | SUBLINGUAL | 3 refills | Status: AC | PRN

## 2018-07-27 NOTE — Telephone Encounter (Signed)
Error

## 2018-07-27 NOTE — Telephone Encounter (Signed)
Pt verbalized consent for telehealth appt with Dr Bronson Ing 07/27/18 and that insurance would be billed for the encounter. Explained Webex and confirmed email. Reviewed pt medications/allergies/pharmacy. Pt does not have a way to check HR/NP/weight at home.

## 2018-07-28 ENCOUNTER — Encounter: Payer: Self-pay | Admitting: Cardiovascular Disease

## 2018-07-28 ENCOUNTER — Telehealth (INDEPENDENT_AMBULATORY_CARE_PROVIDER_SITE_OTHER): Payer: Medicaid Other | Admitting: Cardiovascular Disease

## 2018-07-28 DIAGNOSIS — I25118 Atherosclerotic heart disease of native coronary artery with other forms of angina pectoris: Secondary | ICD-10-CM

## 2018-07-28 DIAGNOSIS — R079 Chest pain, unspecified: Secondary | ICD-10-CM | POA: Diagnosis not present

## 2018-07-28 DIAGNOSIS — Z86718 Personal history of other venous thrombosis and embolism: Secondary | ICD-10-CM | POA: Diagnosis not present

## 2018-07-28 DIAGNOSIS — E785 Hyperlipidemia, unspecified: Secondary | ICD-10-CM

## 2018-07-28 DIAGNOSIS — I471 Supraventricular tachycardia: Secondary | ICD-10-CM

## 2018-07-28 MED ORDER — ISOSORBIDE DINITRATE 10 MG PO TABS: 10.0000 mg | ORAL_TABLET | Freq: Two times a day (BID) | ORAL | 6 refills | Status: DC

## 2018-07-28 NOTE — Addendum Note (Signed)
Addended by: Laurine Blazer on: 07/28/2018 03:58 PM   Modules accepted: Orders

## 2018-07-28 NOTE — Progress Notes (Signed)
Virtual Visit via Video Note   This visit type was conducted due to national recommendations for restrictions regarding the COVID-19 Pandemic (e.g. social distancing) in an effort to limit this patient's exposure and mitigate transmission in our community.  Due to her co-morbid illnesses, this patient is at least at moderate risk for complications without adequate follow up.  This format is felt to be most appropriate for this patient at this time.  All issues noted in this document were discussed and addressed.  A limited physical exam was performed with this format.  Please refer to the patient's chart for her consent to telehealth for [facility].   Evaluation Performed:  Follow-up visit  Date:  07/28/2018   ID:  Angela Michael, DOB Feb 17, 1961, MRN [id number]  Patient Location: Home Provider Location: Office  PCP:  Chevis Pretty, [place]  Cardiologist:  No primary care provider on file.  Electrophysiologist:  None   Chief Complaint:  Chest pain  History of Present Illness:    Angela Michael is a 58 y.o. female with a history of nonobstructive coronary artery disease, morbid obesity status post gastric bypass surgery, and follicular lymphoma.  I last saw her in December 2016 and she was last seen by Dr. Percival Spanish in June 2018.  She had a negative nuclear stress test at [facility] in August 2016.  She had a cardiac catheterization at [facility] on 04/24/2015.  This demonstrated a 50% proximal RCA stenosis and 30% proximal and 50% mid LAD stenosis.  FFR of the LAD was 0.85.  She has a history of elevated pulmonary pressures, obesity, DVT with IVC filter, and SVT.  She has had more frequent chest pains over the past month and has taken nitroglycerin 4 times.  Each episode occurred while at rest.  She describes it as a heaviness in the central part of her chest radiating to her back and shoulder blades.  She has some associated lightheadedness but denies associated palpitations  and shortness of breath.  She also describes tenderness underneath the left breast.  She denies leg swelling, orthopnea, and paroxysmal nocturnal dyspnea.  She is more fatigued.  She said she eats 1 good meal a day.  She believes her blood pressure runs in the 119/60 range with a heart rate in the 50-60 range.  The patient does not have symptoms concerning for COVID-19 infection (fever, chills, cough, or new shortness of breath).    Past Medical History:  Diagnosis Date  . Anemia   . CAD (coronary artery disease)    RCA 50%, LAD 30% cath 2017.   Marland Kitchen Chronic back pain   . Chronic neck pain   . Chronic pain   . Depression   . Headache   . Heart murmur   . History of syncope   . Hyperlipidemia   . Lymphoma ([place])    Stage 2 - year 3 of remission  . Neuropathy   . Panniculitis   . Peripheral edema   . PVC's (premature ventricular contractions)   . Seizures ([place])   . Sleep apnea    no longer needed it after weight loss  . SVT (supraventricular tachycardia) ([place])   . Varicose veins of both lower extremities   . Wears glasses    Past Surgical History:  Procedure Laterality Date  . ABDOMINOPLASTY N/A 03/02/2017   Procedure: BACK LIPECTOMY;  Surgeon: Irene Limbo, MD;  Location: [place];  Service: Plastics;  Laterality: N/A;  . ABDOMINOPLASTY/PANNICULECTOMY N/A 03/02/2017   Procedure: ABDOMINOPLASTY/PANNICULECTOMY;  Surgeon: Irene Limbo, MD;  Location: [place];  Service: Plastics;  Laterality: N/A;  . BREAST SURGERY     " uplift"  . CATARACT EXTRACTION Right   . CATARACT EXTRACTION W/ INTRAOCULAR LENS  IMPLANT, BILATERAL    . CHOLECYSTECTOMY    . COLONOSCOPY    . ENDOVENOUS ABLATION SAPHENOUS VEIN W/ LASER Right 09-08-2013   EVLA right small saphenous vein and stab phlebectomy 10-20 incisions right leg by Curt Jews MD  . GASTRIC BYPASS  2007   Says she had lost 345 lbs  . LYMPH NODE DISSECTION Left    Resected  . lymph node removed from stomach    . MEDIAL PARTIAL KNEE  REPLACEMENT  05/10/14   left knee  . MEDIAL PARTIAL KNEE REPLACEMENT Left 06/04/2015   [facility]  . SKIN SURGERY     Removal of excess skin  . TOOTH EXTRACTION Right 02/20/2017  . VENA CAVA FILTER PLACEMENT    . VENOUS THROMBECTOMY     Blood clot resection from right arm     Current Meds  Medication Sig  . acetaminophen (TYLENOL) 500 MG tablet Take 1,000 mg every 6 (six) hours as needed by mouth for moderate pain or headache.  Marland Kitchen aspirin 81 MG tablet Take 81 mg by mouth daily. Reported on 06/21/2015  . atorvastatin (LIPITOR) 40 MG tablet Take 1 tablet (40 mg total) by mouth at bedtime.  . cyanocobalamin (,VITAMIN B-12,) 1000 MCG/ML injection Inject 1 mL (1,000 mcg total) into the muscle every 30 (thirty) days.  . cyclobenzaprine (FLEXERIL) 5 MG tablet TAKE 1 TABLET BY MOUTH THREE TIMES A DAY AS NEEDED FOR MUSCLE SPASMS  . docusate sodium (COLACE) 100 MG capsule Take 1 capsule (100 mg total) by mouth 2 (two) times daily. (Patient taking differently: Take 100 mg 2 (two) times daily as needed by mouth for mild constipation. )  . Fe Fum-FePoly-FA-Vit C-Vit B3 (INTEGRA F) 125-1 MG CAPS Take 1 capsule by mouth daily.  . fentaNYL (DURAGESIC) 75 MCG/HR Place 1 patch onto the skin every 3 (three) days for 30 days.  . fentaNYL (DURAGESIC) 75 MCG/HR Place 1 patch onto the skin every 3 (three) days for 30 days.  . fentaNYL (DURAGESIC) 75 MCG/HR Place 1 patch onto the skin every 3 (three) days for 30 days.  Marland Kitchen FLUoxetine (PROZAC) 40 MG capsule Take 2 capsules (80 mg total) by mouth daily.  . fluticasone (FLONASE) 50 MCG/ACT nasal spray PLACE 2 SPRAYS INTO THE NOSE AS NEEDED. FOR ALLERGIES  . furosemide (LASIX) 40 MG tablet TAKE 1 TABLET (40 MG TOTAL) BY MOUTH DAILY.  Marland Kitchen gabapentin (NEURONTIN) 300 MG capsule TAKE 2 CAPSULE BY MOUTH TID  . levETIRAcetam (KEPPRA) 500 MG tablet TAKE 2 TABLETS BY MOUTH 2 TIMES DAILY  . loratadine (CLARITIN) 10 MG tablet Take 10 mg daily as needed by mouth for allergies.   .  meloxicam (MOBIC) 15 MG tablet TAKE 1 TABLET BY MOUTH EVERY DAY  . Multiple Vitamin (MULTIVITAMIN WITH MINERALS) TABS Take 1 tablet by mouth daily. Reported on 06/21/2015  . nitroGLYCERIN (NITROSTAT) 0.4 MG SL tablet Place 1 tablet (0.4 mg total) under the tongue every 5 (five) minutes as needed for chest pain.  . polyethylene glycol powder (GLYCOLAX/MIRALAX) powder Take 17 g by mouth daily as needed. (Patient taking differently: Take 17 g daily as needed by mouth for moderate constipation. )  . potassium chloride SA (K-DUR,KLOR-CON) 20 MEQ tablet Take 1 tablet (20 mEq total) by mouth 2 (two) times daily.  Marland Kitchen  traZODone (DESYREL) 50 MG tablet TAKE 1 TO 3 TABLETS AT BEDTIME     Allergies:   Advicor [niacin-lovastatin er] and Niacin   Social History   Tobacco Use  . Smoking status: Never Smoker  . Smokeless tobacco: Never Used  Substance Use Topics  . Alcohol use: Yes    Alcohol/week: 0.0 standard drinks    Comment: rare  . Drug use: No     Family Hx: The patient's family history includes Bone cancer in her maternal grandfather; Diabetes in her father and paternal uncle; Heart attack in her brother; Heart failure in her brother; Kidney cancer in her father; Liver cancer in her father; Lung cancer in her mother; Tuberculosis in her maternal grandmother. There is no history of Coronary artery disease, Sudden death, Cardiomyopathy, Esophageal cancer, or Stomach cancer.  ROS:   Please see the history of present illness.     All other systems reviewed and are negative.   Prior CV studies:   The following studies were reviewed today:  January 2017 Cath reviewed above  Labs/Other Tests and Data Reviewed:    EKG:  No ECG reviewed.  Recent Labs: 05/20/2018: ALT 38; BUN 18; Creatinine, Ser 0.84; Hemoglobin 12.9; Platelets 136; Potassium 3.1; Sodium 142   Recent Lipid Panel Lab Results  Component Value Date/Time   CHOL 131 05/20/2018 10:52 AM   CHOL 150 08/06/2012 09:50 AM   TRIG 95  05/20/2018 10:52 AM   TRIG 93 06/23/2014 12:42 PM   TRIG 103 08/06/2012 09:50 AM   HDL 53 05/20/2018 10:52 AM   HDL 56 06/23/2014 12:42 PM   HDL 44 08/06/2012 09:50 AM   CHOLHDL 2.5 05/20/2018 10:52 AM   LDLCALC 59 05/20/2018 10:52 AM   LDLCALC 78 12/05/2013 10:22 AM   LDLCALC 85 08/06/2012 09:50 AM    Wt Readings from Last 3 Encounters:  05/20/18 200 lb (90.7 kg)  12/23/17 200 lb 9.6 oz (91 kg)  11/17/17 203 lb (92.1 kg)     Objective:    Vital Signs:  There were no vitals taken for this visit.   Well nourished, well developed female in no acute distress. No jugular venous distention. HEENT: Extraocular movements intact, normocephalic/atraumatic  ASSESSMENT & PLAN:    1.  Coronary artery disease: She is on ASA and statin.  She is having more frequent anginal symptoms.  I will start Isordil 10 mg twice daily.  I will obtain an ECG and eventually schedule a coronary CT angiogram once restrictions are lifted during this coronavirus pandemic.  2.  PSVT: No symptom recurrence.  3.  Hyperlipidemia: On statin.  LDL 59 on 05/20/2018.  4.  History of DVT: She has an IVC filter.   COVID-19 Education: The signs and symptoms of COVID-19 were discussed with the patient and how to seek care for testing (follow up with PCP or arrange E-visit).  The importance of social distancing was discussed today.  Time:   Today, I have spent 40 minutes with the patient with telehealth technology discussing the above problems.    A high level of decision making was required for increased medical complexities.   Medication Adjustments/Labs and Tests Ordered: Current medicines are reviewed at length with the patient today.  Concerns regarding medicines are outlined above.   Tests Ordered: No orders of the defined types were placed in this encounter.   Medication Changes: No orders of the defined types were placed in this encounter.   Disposition:  Follow up 2 months  Signed,  Kate Sable, MD  07/28/2018 2:57 PM    [facility]

## 2018-07-28 NOTE — Patient Instructions (Addendum)
Medication Instructions:   Begin Isordil 10mg  twice a day.  Continue all other medications.    Labwork: none  Testing/Procedures: Coronary CT - office will contact you when this can be scheduled.  Follow-Up: 2 months - early to mid June   Any Other Special Instructions Will Be Listed Below (If Applicable).  If you need a refill on your cardiac medications before your next appointment, please call your pharmacy.  =============================================================================  Please arrive at the [facility] main entrance of [facility] at xx:xx AM (30-45 minutes prior to test start time)  [facility] [place], [place] [address] [phone]  Proceed to the [facility] Radiology Department (First Floor).  Please follow these instructions carefully (unless otherwise directed):  Hold all erectile dysfunction medications at least 48 hours prior to test.  On the Night Before the Test: . Be sure to Drink plenty of water. . Do not consume any caffeinated/decaffeinated beverages or chocolate 12 hours prior to your test. . Do not take any antihistamines 12 hours prior to your test. . If you take Metformin do not take 24 hours prior to test. . If the patient has contrast allergy: ? Patient will need a prescription for Prednisone and very clear instructions (as follows): 1. Prednisone 50 mg - take 13 hours prior to test 2. Take another Prednisone 50 mg 7 hours prior to test 3. Take another Prednisone 50 mg 1 hour prior to test 4. Take Benadryl 50 mg 1 hour prior to test . Patient must complete all four doses of above prophylactic medications. . Patient will need a ride after test due to Benadryl.  On the Day of the Test: . Drink plenty of water. Do not drink any water within one hour of the test. . Do not eat any food 4 hours prior to the test. . You may take your regular medications prior to the test.  . Take metoprolol  (Lopressor) two hours prior to test. (25mg  x 1 dose) . HOLD Furosemide/Hydrochlorothiazide morning of the test.     After the Test: . Drink plenty of water. . After receiving IV contrast, you may experience a mild flushed feeling. This is normal. . On occasion, you may experience a mild rash up to 24 hours after the test. This is not dangerous. If this occurs, you can take Benadryl 25 mg and increase your fluid intake. . If you experience trouble breathing, this can be serious. If it is severe call 911 IMMEDIATELY. If it is mild, please call our office. . If you take any of these medications: Glipizide/Metformin, Avandament, Glucavance, please do not take 48 hours after completing test.

## 2018-08-11 ENCOUNTER — Other Ambulatory Visit: Payer: Self-pay | Admitting: Nurse Practitioner

## 2018-08-12 NOTE — Telephone Encounter (Signed)
Last seen 05/20/18

## 2018-08-19 ENCOUNTER — Ambulatory Visit (INDEPENDENT_AMBULATORY_CARE_PROVIDER_SITE_OTHER): Payer: Medicaid Other | Admitting: Nurse Practitioner

## 2018-08-19 ENCOUNTER — Encounter: Payer: Self-pay | Admitting: Nurse Practitioner

## 2018-08-19 ENCOUNTER — Other Ambulatory Visit: Payer: Self-pay

## 2018-08-19 DIAGNOSIS — I8393 Asymptomatic varicose veins of bilateral lower extremities: Secondary | ICD-10-CM | POA: Diagnosis not present

## 2018-08-19 DIAGNOSIS — E876 Hypokalemia: Secondary | ICD-10-CM | POA: Diagnosis not present

## 2018-08-19 DIAGNOSIS — G5691 Unspecified mononeuropathy of right upper limb: Secondary | ICD-10-CM

## 2018-08-19 DIAGNOSIS — I471 Supraventricular tachycardia: Secondary | ICD-10-CM

## 2018-08-19 DIAGNOSIS — F3342 Major depressive disorder, recurrent, in full remission: Secondary | ICD-10-CM

## 2018-08-19 DIAGNOSIS — E785 Hyperlipidemia, unspecified: Secondary | ICD-10-CM | POA: Diagnosis not present

## 2018-08-19 DIAGNOSIS — R609 Edema, unspecified: Secondary | ICD-10-CM

## 2018-08-19 DIAGNOSIS — R569 Unspecified convulsions: Secondary | ICD-10-CM

## 2018-08-19 DIAGNOSIS — F5101 Primary insomnia: Secondary | ICD-10-CM

## 2018-08-19 DIAGNOSIS — D61818 Other pancytopenia: Secondary | ICD-10-CM

## 2018-08-19 MED ORDER — FLUOXETINE HCL 40 MG PO CAPS: 80.0000 mg | ORAL_CAPSULE | Freq: Every day | ORAL | 5 refills | Status: DC

## 2018-08-19 MED ORDER — CYCLOBENZAPRINE HCL 5 MG PO TABS: 5.0000 mg | ORAL_TABLET | Freq: Every day | ORAL | 0 refills | Status: DC

## 2018-08-19 MED ORDER — POTASSIUM CHLORIDE CRYS ER 20 MEQ PO TBCR: 20.0000 meq | EXTENDED_RELEASE_TABLET | Freq: Two times a day (BID) | ORAL | 5 refills | Status: DC

## 2018-08-19 MED ORDER — ATORVASTATIN CALCIUM 40 MG PO TABS: 40.0000 mg | ORAL_TABLET | Freq: Every day | ORAL | 5 refills | Status: DC

## 2018-08-19 MED ORDER — FENTANYL 75 MCG/HR TD PT72: 1.0000 | MEDICATED_PATCH | TRANSDERMAL | 0 refills | Status: DC

## 2018-08-19 MED ORDER — FUROSEMIDE 40 MG PO TABS: ORAL_TABLET | ORAL | 5 refills | Status: DC

## 2018-08-19 MED ORDER — INTEGRA F 125-1 MG PO CAPS: 1.0000 | ORAL_CAPSULE | Freq: Every day | ORAL | 1 refills | Status: DC

## 2018-08-19 MED ORDER — LEVETIRACETAM 500 MG PO TABS: ORAL_TABLET | ORAL | 5 refills | Status: DC

## 2018-08-19 MED ORDER — GABAPENTIN 300 MG PO CAPS: ORAL_CAPSULE | ORAL | 5 refills | Status: DC

## 2018-08-19 MED ORDER — TRAZODONE HCL 50 MG PO TABS: ORAL_TABLET | ORAL | 1 refills | Status: DC

## 2018-08-19 NOTE — Progress Notes (Signed)
Virtual Visit via telephone Note  I connected with Angela Michael on 08/19/18 at 10:40 AM by telephone and verified that I am speaking with the correct person using two identifiers. Angela Michael is currently located at home and her husband  is currently with her during visit. The provider, Mary-Margaret Hassell Done, FNP is located in their office at time of visit.  I discussed the limitations, risks, security and privacy concerns of performing an evaluation and management service by telephone and the availability of in person appointments. I also discussed with the patient that there may be a patient responsible charge related to this service. The patient expressed understanding and agreed to proceed.   History and Present Illness:   Chief Complaint: Medical Management of Chronic Issues    HPI:  1. Hyperlipidemia with target LDL less than 100 Does not really watch diet and does very little exercise  2. Hypokalemia denies any lower ext cramping.  3. SVT (supraventricular tachycardia) (HCC) Denies any palpitations or feeling that heart is racing  4. Asymptomatic varicose veins of both lower extremities Has very large varicose veins in bil legs- legs feel heavy at times. She will not wear compression socks  5. Pancytopenia ([place]) Last CBC was completely normal  6. Seizures ([name]) No recent episodes. Is still taking keppra daily  7. Insomnia, unspecified type Currently taking trazadone and works well to help her sleep.  8. Peripheral edema Has edema when she is on her feet alot  9. Depression, unspecified depression type Has been on prozac for several years.  Depression screen [facility] 2/9 05/20/2018 03/16/2018 02/17/2018  Decreased Interest 0 1 0  Down, Depressed, Hopeless 0 0 0  PHQ - 2 Score 0 1 0  Altered sleeping - - -  Tired, decreased energy - - -  Change in appetite - - -  Feeling bad or failure about yourself  - - -  Trouble concentrating - - -  Moving slowly or  fidgety/restless - - -  Suicidal thoughts - - -  PHQ-9 Score - - -  Some recent data might be hidden      10. Neuropathy of right upper extremity Has constant pain Pain assessment: Cause of pain- damage to arm Pain location- right arm and shoulder Pain on scale of 1-10- 5/10 currently Frequency- daily What increases pain-nothing really What makes pain Better-fentanyl patch Effects on ADL - doe swhat she is able to do Any change in general medical condition-nothing  Current opioids rx- fentanyl 57mcg # meds rx- 10 patches Effectiveness of current meds-helps bring pain down to 3//10 Adverse reactions form pain meds-none Morphine equivalent- 180 MEDD  Pill count performed-No Last drug screen - 12/23/17 ( high risk q58m, moderate risk q45m, low risk yearly ) Urine drug screen today- No Was the [place] reviewed- yes  If yes were their any concerning findings? - no    Pain contract signed on: 12/28/17   11. Morbid obesity ([place]) No recent weight changes    Outpatient Encounter Medications as of 08/19/2018  Medication Sig  . acetaminophen (TYLENOL) 500 MG tablet Take 1,000 mg every 6 (six) hours as needed by mouth for moderate pain or headache.  Marland Kitchen aspirin 81 MG tablet Take 81 mg by mouth daily. Reported on 06/21/2015  . atorvastatin (LIPITOR) 40 MG tablet Take 1 tablet (40 mg total) by mouth at bedtime.  . cyanocobalamin (,VITAMIN B-12,) 1000 MCG/ML injection Inject 1 mL (1,000 mcg total) into the muscle every 30 (thirty) days.  Marland Kitchen  cyclobenzaprine (FLEXERIL) 5 MG tablet TAKE 1 TABLET BY MOUTH THREE TIMES A DAY AS NEEDED FOR MUSCLE SPASMS  . docusate sodium (COLACE) 100 MG capsule Take 1 capsule (100 mg total) by mouth 2 (two) times daily. (Patient taking differently: Take 100 mg 2 (two) times daily as needed by mouth for mild constipation. )  . Fe Fum-FePoly-FA-Vit C-Vit B3 (INTEGRA F) 125-1 MG CAPS Take 1 capsule by mouth daily.  . fentaNYL (DURAGESIC) 75 MCG/HR Place 1 patch onto  the skin every 3 (three) days for 30 days.  . fentaNYL (DURAGESIC) 75 MCG/HR Place 1 patch onto the skin every 3 (three) days for 30 days.  . fentaNYL (DURAGESIC) 75 MCG/HR Place 1 patch onto the skin every 3 (three) days for 30 days.  Marland Kitchen FLUoxetine (PROZAC) 40 MG capsule Take 2 capsules (80 mg total) by mouth daily.  . fluticasone (FLONASE) 50 MCG/ACT nasal spray PLACE 2 SPRAYS INTO THE NOSE AS NEEDED. FOR ALLERGIES  . furosemide (LASIX) 40 MG tablet TAKE 1 TABLET (40 MG TOTAL) BY MOUTH DAILY.  Marland Kitchen gabapentin (NEURONTIN) 300 MG capsule TAKE 2 CAPSULE BY MOUTH TID  . isosorbide dinitrate (ISORDIL) 10 MG tablet Take 1 tablet (10 mg total) by mouth 2 (two) times daily.  Marland Kitchen levETIRAcetam (KEPPRA) 500 MG tablet TAKE 2 TABLETS BY MOUTH 2 TIMES DAILY  . loratadine (CLARITIN) 10 MG tablet Take 10 mg daily as needed by mouth for allergies.   . meloxicam (MOBIC) 15 MG tablet TAKE 1 TABLET BY MOUTH EVERY DAY  . Multiple Vitamin (MULTIVITAMIN WITH MINERALS) TABS Take 1 tablet by mouth daily. Reported on 06/21/2015  . nitroGLYCERIN (NITROSTAT) 0.4 MG SL tablet Place 1 tablet (0.4 mg total) under the tongue every 5 (five) minutes as needed for chest pain.  . polyethylene glycol powder (GLYCOLAX/MIRALAX) powder Take 17 g by mouth daily as needed. (Patient taking differently: Take 17 g daily as needed by mouth for moderate constipation. )  . potassium chloride SA (K-DUR,KLOR-CON) 20 MEQ tablet Take 1 tablet (20 mEq total) by mouth 2 (two) times daily.  . traZODone (DESYREL) 50 MG tablet TAKE 1 TO 3 TABLETS AT BEDTIME      New complaints: Non etoday  Social history: Lives with her husband- granddaughter [place] eto live with her and has gone back to her mom. Her other daughter and granddaughter were living with her also and they have moved out. So now it is just her and her husband.      Review of Systems  Constitutional: Negative for diaphoresis and weight loss.  Eyes: Negative for blurred vision, double  vision and pain.  Respiratory: Negative for shortness of breath.   Cardiovascular: Negative for chest pain, palpitations, orthopnea and leg swelling.  Gastrointestinal: Negative for abdominal pain.  Musculoskeletal: Positive for joint pain (right shoulder).  Skin: Negative for rash.  Neurological: Negative for dizziness, sensory change, loss of consciousness, weakness and headaches.  Endo/Heme/Allergies: Negative for polydipsia. Does not bruise/bleed easily.  Psychiatric/Behavioral: Negative for memory loss. The patient does not have insomnia.   All other systems reviewed and are negative.    Observations/Objective: Alert and oriented- answers all questions appropriately No distress noted today  Assessment and Plan: Regene Mccarthy comes in today with chief complaint of Medical Management of Chronic Issues   Diagnosis and orders addressed:  1. Hyperlipidemia with target LDL less than 100 Low fat diet - atorvastatin (LIPITOR) 40 MG tablet; Take 1 tablet (40 mg total) by mouth at bedtime.  Dispense:  30 tablet; Refill: 5  2. Hypokalemia Continue potassium supplement - potassium chloride SA (K-DUR) 20 MEQ tablet; Take 1 tablet (20 mEq total) by mouth 2 (two) times daily.  Dispense: 30 tablet; Refill: 5  3. SVT (supraventricular tachycardia) (HCC) Avoid caffeine Keep follow up with cardiology  4. Asymptomatic varicose veins of both lower extremities Wear compression hose  5. Pancytopenia (HCC) - Fe Fum-FePoly-FA-Vit C-Vit B3 (INTEGRA F) 125-1 MG CAPS; Take 1 capsule by mouth daily.  Dispense: 90 capsule; Refill: 1  6. Seizures (HCC) - levETIRAcetam (KEPPRA) 500 MG tablet; TAKE 2 TABLETS BY MOUTH 2 TIMES DAILY  Dispense: 120 tablet; Refill: 5  7. Peripheral edema Elevate legs when sitting - furosemide (LASIX) 40 MG tablet; TAKE 1 TABLET (40 MG TOTAL) BY MOUTH DAILY.  Dispense: 30 tablet; Refill: 5  8.  Recurrent major depressive disorder, in full remission ([place]) Stress  management - FLUoxetine (PROZAC) 40 MG capsule; Take 2 capsules (80 mg total) by mouth daily.  Dispense: 60 capsule; Refill: 5   9. Neuropathy of right upper extremity - fentaNYL (DURAGESIC) 75 MCG/HR; Place 1 patch onto the skin every 3 (three) days for 30 days.  Dispense: 10 patch; Refill: 0 - fentaNYL (DURAGESIC) 75 MCG/HR; Place 1 patch onto the skin every 3 (three) days for 30 days.  Dispense: 10 patch; Refill: 0 - fentaNYL (DURAGESIC) 75 MCG/HR; Place 1 patch onto the skin every 3 (three) days for 30 days.  Dispense: 10 patch; Refill: 0 - gabapentin (NEURONTIN) 300 MG capsule; TAKE 2 CAPSULE BY MOUTH TID  Dispense: 180 capsule; Refill: 5 - cyclobenzaprine (FLEXERIL) 5 MG tablet; Take 1 tablet (5 mg total) by mouth daily.  Dispense: 90 tablet; Refill: 0  10. Morbid obesity ([place]) Discussed diet and exercise for person with BMI >25 Will recheck weight in 3-6 months  11. Primary insomnia Bedtime routine - traZODone (DESYREL) 50 MG tablet; TAKE 1 TO 3 TABLETS AT BEDTIME  Dispense: 270 tablet; Refill: 1   Previous labs results reviewed Health Maintenance reviewed Diet and exercise encouraged  Follow up plan: 3 months      I discussed the assessment and treatment plan with the patient. The patient was provided an opportunity to ask questions and all were answered. The patient agreed with the plan and demonstrated an understanding of the instructions.   The patient was advised to call back or seek an in-person evaluation if the symptoms worsen or if the condition fails to improve as anticipated.  The above assessment and management plan was discussed with the patient. The patient verbalized understanding of and has agreed to the management plan. Patient is aware to call the clinic if symptoms persist or worsen. Patient is aware when to return to the clinic for a follow-up visit. Patient educated on when it is appropriate to go to the emergency department.   Time call ended:   11:05  AM  I provided 25 minutes of non-face-to-face time during this encounter.    Mary-Margaret Hassell Done, FNP

## 2018-08-23 ENCOUNTER — Telehealth: Payer: Self-pay | Admitting: Nurse Practitioner

## 2018-09-13 ENCOUNTER — Telehealth: Payer: Self-pay | Admitting: Cardiovascular Disease

## 2018-09-13 NOTE — Telephone Encounter (Signed)
Noted  

## 2018-09-13 NOTE — Telephone Encounter (Signed)
I will forward to [place]

## 2018-09-13 NOTE — Telephone Encounter (Signed)
Left message to call and schedule ct

## 2018-09-16 ENCOUNTER — Other Ambulatory Visit: Payer: Self-pay | Admitting: Nurse Practitioner

## 2018-09-16 DIAGNOSIS — G5691 Unspecified mononeuropathy of right upper limb: Secondary | ICD-10-CM

## 2018-09-17 NOTE — Telephone Encounter (Signed)
Last seen 08/19/18 Flexeril last rx'd 08/19/18 #90 no refills Meloxicam last rx'd 06/08/18 #30 with 2 refills

## 2018-09-22 ENCOUNTER — Ambulatory Visit: Payer: Medicaid Other | Admitting: Cardiovascular Disease

## 2018-09-22 ENCOUNTER — Encounter: Payer: Self-pay | Admitting: Cardiovascular Disease

## 2018-09-27 ENCOUNTER — Telehealth: Payer: Self-pay | Admitting: *Deleted

## 2018-09-27 DIAGNOSIS — R079 Chest pain, unspecified: Secondary | ICD-10-CM

## 2018-09-27 DIAGNOSIS — Z01812 Encounter for preprocedural laboratory examination: Secondary | ICD-10-CM

## 2018-09-27 NOTE — Telephone Encounter (Signed)
Patient notified need BMET just prior to this test.  She will go on 10/01/18 to her pmd ([facility] [place]).  Lab order entered & faxed to pmd.

## 2018-09-27 NOTE — Telephone Encounter (Signed)
-----   Message ----- From: Howie Ill Sent: 09/24/2018  11:22 AM EDT To: Laurine Blazer, LPN Subject: cardiac ct                                     Patient scheduled June 24/20 @1230p

## 2018-09-30 ENCOUNTER — Other Ambulatory Visit: Payer: Self-pay

## 2018-09-30 ENCOUNTER — Other Ambulatory Visit: Payer: Medicaid Other

## 2018-10-01 LAB — BASIC METABOLIC PANEL
BUN/Creatinine Ratio: 16 (ref 9–23)
BUN: 14 mg/dL (ref 6–24)
CO2: 26 mmol/L (ref 20–29)
Calcium: 8.6 mg/dL — ABNORMAL LOW (ref 8.7–10.2)
Chloride: 103 mmol/L (ref 96–106)
Creatinine, Ser: 0.88 mg/dL (ref 0.57–1.00)
GFR calc Af Amer: 85 mL/min/{1.73_m2} (ref >59)
GFR calc non Af Amer: 74 mL/min/{1.73_m2} (ref >59)
Glucose: 81 mg/dL (ref 65–99)
Potassium: 4.2 mmol/L (ref 3.5–5.2)
Sodium: 140 mmol/L (ref 134–144)

## 2018-10-05 ENCOUNTER — Telehealth (HOSPITAL_COMMUNITY): Payer: Self-pay | Admitting: Emergency Medicine

## 2018-10-05 NOTE — Telephone Encounter (Signed)
Reaching out to patient to offer assistance regarding upcoming cardiac imaging study; pt verbalizes understanding of appt date/time, parking situation and where to check in, pre-test NPO status and medications ordered, and verified current allergies; name and call back number provided for further questions should they arise Javonnie Illescas RN Navigator Cardiac Imaging [facility] [phone] office [phone] cell  Pt denies covid symptoms, verbalized understanding of visitor policy. 

## 2018-10-06 ENCOUNTER — Encounter (HOSPITAL_COMMUNITY): Payer: Self-pay

## 2018-10-06 ENCOUNTER — Other Ambulatory Visit: Payer: Self-pay

## 2018-10-06 ENCOUNTER — Ambulatory Visit (HOSPITAL_COMMUNITY)
Admission: RE | Admit: 2018-10-06 | Discharge: 2018-10-06 | Disposition: A | Discharge status: Home / Self Care | Payer: Medicaid Other | Source: Ambulatory Visit | Attending: Cardiovascular Disease | Admitting: Cardiovascular Disease

## 2018-10-06 DIAGNOSIS — I25118 Atherosclerotic heart disease of native coronary artery with other forms of angina pectoris: Secondary | ICD-10-CM

## 2018-10-06 MED ORDER — IOHEXOL 350 MG/ML SOLN
100.0000 mL | Freq: Once | INTRAVENOUS | Status: AC | PRN
  Administered 2018-10-06: 80 mL via INTRAVENOUS

## 2018-10-06 MED ORDER — NITROGLYCERIN 0.4 MG SL SUBL
SUBLINGUAL_TABLET | SUBLINGUAL | Status: AC
  Filled 2018-10-06: qty 2

## 2018-10-06 MED ORDER — NITROGLYCERIN 0.4 MG SL SUBL: 0.8000 mg | SUBLINGUAL_TABLET | Freq: Once | SUBLINGUAL | Status: DC

## 2018-10-06 NOTE — Discharge Instructions (Signed)
What You Need to Know About IV Contrast Material °IV contrast material is most often a fluid that is used with some imaging tests. Contrast material is injected into your body through a vein to help your health care providers see your organs and tissues more clearly. It may be used with: °· X-ray. °· MRI. °· CT. °· Ultrasound. °Contrast material is used when your health care providers need a detailed look at organs, tissues, or blood vessels that may not show up with the standard test. IV contrast may be used for imaging tests that examine: °· Muscles, skin, and fat. °· Breasts. °· Brain. °· Digestive tract. °· Heart. °· Liver. °· Lungs and many other internal organs. °What are the risks of using IV contrast material? °The risks of using IV contrast material include: °· Headache. °· Itching, skin rash, and hives. °· Allergic reactions. °· Nausea and vomiting. °· Wheezing or difficulty breathing. °· Abnormal heart rate. °· Blood pressure changes. °· Throat swelling. °· Kidney damage. °These complications are more likely to occur in people who: °· Have kidney failure. °· Have liver problems. °· Have certain heart problems, including: °? Heart failure. °? Heart attack. °? Heart infection. °? Heart valve problems. °· Abuse alcohol. °· Have allergies or asthma. °· Are dehydrated. °· Have sickle cell anemia or similar problems. °· Have had trouble with IV contrast material in the past. °· Take certain medicines, such as: °? Metformin. °? NSAIDs. °? Beta blockers. °? Interleukin-2. °How do I prepare for my test with IV contrast material? °· Follow instructions from your health care provider about eating or drinking restrictions. °· Ask your health care provider about changing or stopping your regular medicines. This is especially important if you are taking diabetes medicines or blood thinners. °· Tell your health care provider about: °? Any previous illnesses, surgeries, or pre-existing medical conditions. °? Whether you  are pregnant or may be pregnant. °? Whether you are breastfeeding. Most contrast agents are safe for use in breastfeeding women. °· You may have a physical exam to determine any potential risks. °· Ask if you will be given a medicine (sedative) to help you relax during the procedure. If so, plan to have someone take you home after test. °What happens during the test with IV contrast material? ° °· You may be given a sedative to help you relax. °· A needle will be inserted into one of your veins to administer the IV contrast material. °· You may feel warmth or flushing as the material enters your bloodstream. °· You may have a metallic taste in your mouth for a few minutes. °· The needle may cause some discomfort and bruising. °· After the contrast material is in your body, the imaging test will be done. °The procedure may vary among health care providers and hospitals. °What happens after the test with IV contrast material? °· You may be asked to drink water or other fluids to wash (flush) the contrast material out of your body. °· Drink enough fluid to keep your urine pale yellow. °· Do not drive for 24 hours if you received a sedative. °· It is your responsibility to get your test results. Ask your health care provider or the department performing the test when your results will be ready. °Contact a health care provider if: °· You have redness, swelling, or pain near your IV site. °Get help right away if: °· You have an abnormal heart rhythm. °· You have trouble breathing. °· You have: °?   Chest pain. ? Pain in your back, neck, arm, jaw, or stomach. ? Nausea or sweating. ? Hives or a rash.  You start shaking and cannot stop. These symptoms may represent a serious problem that is an emergency. Do not wait to see if the symptoms will go away. Get medical help right away. Call your local emergency services (911 in the [place] Do not drive yourself to the hospital. Summary  IV contrast may be used for imaging  tests to help your health care providers see your organs and tissues more clearly.  Tell your health care provider if you are pregnant or may be pregnant.  During the procedure, you may feel warmth or flushing as the material enters your bloodstream.  After the procedure, drink enough fluid to keep your urine pale yellow. This information is not intended to replace advice given to you by your health care provider. Make sure you discuss any questions you have with your health care provider. Document Released: 03/19/2009 Document Revised: 11/23/2017 Document Reviewed: 12/06/2014 Elsevier Interactive Patient Education  2019 [place].   Cardiac CT Angiogram  A cardiac CT angiogram is a procedure to look at the heart and the area around the heart. It may be done to help find the cause of chest pains or other symptoms of heart disease. During this procedure, a large X-ray machine, called a CT scanner, takes detailed pictures of the heart and the surrounding area after a dye (contrast material) has been injected into blood vessels in the area. The procedure is also sometimes called a coronary CT angiogram, coronary artery scanning, or CTA. A cardiac CT angiogram allows the health care provider to see how well blood is flowing to and from the heart. The health care provider will be able to see if there are any problems, such as:  Blockage or narrowing of the coronary arteries in the heart.  Fluid around the heart.  Signs of weakness or disease in the muscles, valves, and tissues of the heart. Tell a health care provider about:  Any allergies you have. This is especially important if you have had a previous allergic reaction to contrast dye.  All medicines you are taking, including vitamins, herbs, eye drops, creams, and over-the-counter medicines.  Any blood disorders you have.  Any surgeries you have had.  Any medical conditions you have.  Whether you are pregnant or may be  pregnant.  Any anxiety disorders, chronic pain, or other conditions you have that may increase your stress or prevent you from lying still. What are the risks? Generally, this is a safe procedure. However, problems may occur, including:  Bleeding.  Infection.  Allergic reactions to medicines or dyes.  Damage to other structures or organs.  Kidney damage from the dye or contrast that is used.  Increased risk of cancer from radiation exposure. This risk is low. Talk with your health care provider about: ? The risks and benefits of testing. ? How you can receive the lowest dose of radiation. What happens before the procedure?  Wear comfortable clothing and remove any jewelry, glasses, dentures, and hearing aids.  Follow instructions from your health care provider about eating and drinking. This may include: ? For 12 hours before the test -- avoid caffeine. This includes tea, coffee, soda, energy drinks, and diet pills. Drink plenty of water or other fluids that do not have caffeine in them. Being well-hydrated can prevent complications. ? For 4-6 hours before the test -- stop eating and drinking. The  contrast dye can cause nausea, but this is less likely if your stomach is empty.  Ask your health care provider about changing or stopping your regular medicines. This is especially important if you are taking diabetes medicines, blood thinners, or medicines to treat erectile dysfunction. What happens during the procedure?  Hair on your chest may need to be removed so that small sticky patches called electrodes can be placed on your chest. These will transmit information that helps to monitor your heart during the test.  An IV tube will be inserted into one of your veins.  You might be given a medicine to control your heart rate during the test. This will help to ensure that good images are obtained.  You will be asked to lie on an exam table. This table will slide in and out of the CT  machine during the procedure.  Contrast dye will be injected into the IV tube. You might feel warm, or you may get a metallic taste in your mouth.  You will be given a medicine (nitroglycerin) to relax (dilate) the arteries in your heart.  The table that you are lying on will move into the CT machine tunnel for the scan.  The person running the machine will give you instructions while the scans are being done. You may be asked to: ? Keep your arms above your head. ? Hold your breath. ? Stay very still, even if the table is moving.  When the scanning is complete, you will be moved out of the machine.  The IV tube will be removed. The procedure may vary among health care providers and hospitals. What happens after the procedure?  You might feel warm, or you may get a metallic taste in your mouth from the contrast dye.  You may have a headache from the nitroglycerin.  After the procedure, drink water or other fluids to wash (flush) the contrast material out of your body.  Contact a health care provider if you have any symptoms of allergy to the contrast. These symptoms include: ? Shortness of breath. ? Rash or hives. ? A racing heartbeat.  Most people can return to their normal activities right after the procedure. Ask your health care provider what activities are safe for you.  It is up to you to get the results of your procedure. Ask your health care provider, or the department that is doing the procedure, when your results will be ready. Summary  A cardiac CT angiogram is a procedure to look at the heart and the area around the heart. It may be done to help find the cause of chest pains or other symptoms of heart disease.  During this procedure, a large X-ray machine, called a CT scanner, takes detailed pictures of the heart and the surrounding area after a dye (contrast material) has been injected into blood vessels in the area.  Ask your health care provider about changing or  stopping your regular medicines before the procedure. This is especially important if you are taking diabetes medicines, blood thinners, or medicines to treat erectile dysfunction.  After the procedure, drink water or other fluids to wash (flush) the contrast material out of your body. This information is not intended to replace advice given to you by your health care provider. Make sure you discuss any questions you have with your health care provider. Document Released: 03/13/2008 Document Revised: 02/18/2016 Document Reviewed: 02/18/2016 Elsevier Interactive Patient Education  2019 [employer].

## 2018-10-06 NOTE — Progress Notes (Signed)
Pt C/O dizziness post exam.  BP 110/71, HR 60.  Pt placed in chair with feet up.  Provided caffeinated beverage and crackers.  Pt rested for appx 25 min post exam, prt stated dizziness was resolved.  PIV removed and dressing applied.  Verbal and written discharge instructions given to patient.  Pt discharged

## 2018-10-08 ENCOUNTER — Encounter: Payer: Self-pay | Admitting: *Deleted

## 2018-10-08 ENCOUNTER — Telehealth: Payer: Self-pay | Admitting: *Deleted

## 2018-10-08 NOTE — Telephone Encounter (Signed)
Notes recorded by Laurine Blazer, LPN on 4/94/4967 at 5:91 PM EDT  Left message that results will be sent to my chart for patient. Copy to pmd.  ------   Notes recorded by Herminio Commons, MD on 10/07/2018 at 10:35 AM EDT  CAD seen. Further studies on these images are being done to assess significance.  ------   Notes recorded by Herminio Commons, MD on 10/06/2018 at 2:25 PM EDT  No significant extracardiac findings. Await cardiac results.as

## 2018-10-12 ENCOUNTER — Telehealth: Payer: Self-pay | Admitting: *Deleted

## 2018-10-12 NOTE — Telephone Encounter (Signed)
Patient informed and says isordil has helped her symptoms. Copy sent to PCP.

## 2018-10-12 NOTE — Telephone Encounter (Signed)
-----   Message from Herminio Commons, MD sent at 10/08/2018  2:29 PM EDT ----- She has CAD but no blockages appear to be obstructive. Has the Isordil helped to alleviate prior symptoms of chest pain?

## 2018-10-28 ENCOUNTER — Other Ambulatory Visit: Payer: Self-pay | Admitting: Nurse Practitioner

## 2018-10-28 DIAGNOSIS — G5691 Unspecified mononeuropathy of right upper limb: Secondary | ICD-10-CM

## 2018-10-28 NOTE — Telephone Encounter (Signed)
Please advise seen 08/19/2018

## 2018-11-05 ENCOUNTER — Other Ambulatory Visit: Payer: Self-pay

## 2018-11-08 ENCOUNTER — Encounter: Payer: Self-pay | Admitting: Nurse Practitioner

## 2018-11-08 ENCOUNTER — Other Ambulatory Visit: Payer: Self-pay

## 2018-11-08 ENCOUNTER — Ambulatory Visit (INDEPENDENT_AMBULATORY_CARE_PROVIDER_SITE_OTHER): Payer: Medicaid Other | Admitting: Nurse Practitioner

## 2018-11-08 VITALS — BP 93/63 | HR 66 | Temp 99.5°F | Ht 61.0 in | Wt 199.0 lb

## 2018-11-08 DIAGNOSIS — D61818 Other pancytopenia: Secondary | ICD-10-CM

## 2018-11-08 DIAGNOSIS — R600 Localized edema: Secondary | ICD-10-CM

## 2018-11-08 DIAGNOSIS — G4700 Insomnia, unspecified: Secondary | ICD-10-CM

## 2018-11-08 DIAGNOSIS — G5691 Unspecified mononeuropathy of right upper limb: Secondary | ICD-10-CM

## 2018-11-08 DIAGNOSIS — D696 Thrombocytopenia, unspecified: Secondary | ICD-10-CM

## 2018-11-08 DIAGNOSIS — F32A Depression, unspecified: Secondary | ICD-10-CM

## 2018-11-08 DIAGNOSIS — E876 Hypokalemia: Secondary | ICD-10-CM | POA: Diagnosis not present

## 2018-11-08 DIAGNOSIS — I471 Supraventricular tachycardia, unspecified: Secondary | ICD-10-CM

## 2018-11-08 DIAGNOSIS — F329 Major depressive disorder, single episode, unspecified: Secondary | ICD-10-CM

## 2018-11-08 DIAGNOSIS — R569 Unspecified convulsions: Secondary | ICD-10-CM

## 2018-11-08 DIAGNOSIS — E785 Hyperlipidemia, unspecified: Secondary | ICD-10-CM

## 2018-11-08 DIAGNOSIS — R609 Edema, unspecified: Secondary | ICD-10-CM

## 2018-11-08 DIAGNOSIS — I8393 Asymptomatic varicose veins of bilateral lower extremities: Secondary | ICD-10-CM | POA: Diagnosis not present

## 2018-11-08 DIAGNOSIS — C8590 Non-Hodgkin lymphoma, unspecified, unspecified site: Secondary | ICD-10-CM

## 2018-11-08 MED ORDER — FENTANYL 75 MCG/HR TD PT72: 1.0000 | MEDICATED_PATCH | TRANSDERMAL | 0 refills | Status: DC

## 2018-11-08 NOTE — Addendum Note (Signed)
Addended by: Chevis Pretty on: 11/08/2018 12:03 PM   Modules accepted: Orders

## 2018-11-08 NOTE — Progress Notes (Signed)
Subjective:    Patient ID: Angela Michael, female    DOB: 1961-01-18, 58 y.o.   MRN: [id number]   Chief Complaint: Medical Management of Chronic Issues    HPI:  1. SVT (supraventricular tachycardia) ([place]) Had episode last week that lasted about 15 minutes. Has no follow up appointment with cardiology right now.  2. Asymptomatic varicose veins of both lower extremities Does well when wears compression hose.  3. Hyperlipidemia with target LDL less than 100 Has been watching diet but does not do much exercise.  4. Hypokalemia denies any lower ext cramping.  5. Depression, unspecified depression type Is doing well on prozac daily' Depression screen [facility] 2/9 11/08/2018 08/19/2018 05/20/2018  Decreased Interest 0 1 0  Down, Depressed, Hopeless 0 1 0  PHQ - 2 Score 0 2 0  Altered sleeping - 0 -  Tired, decreased energy - 1 -  Change in appetite - 0 -  Feeling bad or failure about yourself  - 1 -  Trouble concentrating - 0 -  Moving slowly or fidgety/restless - 0 -  Suicidal thoughts - 0 -  PHQ-9 Score - 4 -  Difficult doing work/chores - Not difficult at all -  Some recent data might be hidden     6. Seizures ([place]) No recent seizure activity. Is still taking keppra  7. Non-Hodgkin's lymphoma, unspecified body region, unspecified non-Hodgkin lymphoma type [facility]) Patient says that she is currently in remission  8. Insomnia, unspecified type takes trazadone to sleep at night  9. Peripheral edema Is much better. Does not have very often anymore  10. Thrombocytopenia (HCC) Last platelet count was 140. Last saw hematology in February. Has follow up in august.  11. 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 Pain assessment: Cause of pain- neuropathy of right arm Pain location- right shoulder Pain on scale of 1-10- 5/10 Frequency- daily What increases pain-over use of arm What makes pain Better-rest Effects on ADL - none Any  change in general medical condition-none  Current opioids rx- fenatnyl patch 34mcg # meds rx- 10 patches x 3 prescriptions Effectiveness of current meds-helps Adverse reactions form pain meds-none Morphine equivalent- 180MEDD  Pill count performed-No Last drug screen - 12/23/17 ( high risk q75m, moderate risk q20m, low risk yearly ) Urine drug screen today- No Was the [place] reviewed- yes  If yes were their any concerning findings? - none     Pain contract signed on: 12/28/17   12. Morbid obesity ([place]) No recent weight changes    Outpatient Encounter Medications as of 11/08/2018  Medication Sig  . acetaminophen (TYLENOL) 500 MG tablet Take 1,000 mg every 6 (six) hours as needed by mouth for moderate pain or headache.  Marland Kitchen aspirin 81 MG tablet Take 81 mg by mouth daily. Reported on 06/21/2015  . atorvastatin (LIPITOR) 40 MG tablet Take 1 tablet (40 mg total) by mouth at bedtime.  . cyanocobalamin (,VITAMIN B-12,) 1000 MCG/ML injection Inject 1 mL (1,000 mcg total) into the muscle every 30 (thirty) days.  . cyclobenzaprine (FLEXERIL) 5 MG tablet TAKE 1 TABLET BY MOUTH THREE TIMES A DAY AS NEEDED FOR MUSCLE SPASMS  . Fe Fum-FePoly-FA-Vit C-Vit B3 (INTEGRA F) 125-1 MG CAPS Take 1 capsule by mouth daily.  . fentaNYL (DURAGESIC) 75 MCG/HR Place 1 patch onto the skin every 3 (three) days for 30 days.  Marland Kitchen FLUoxetine (PROZAC) 40 MG capsule Take 2 capsules (80 mg total) by mouth daily.  Marland Kitchen  fluticasone (FLONASE) 50 MCG/ACT nasal spray PLACE 2 SPRAYS INTO THE NOSE AS NEEDED. FOR ALLERGIES  . furosemide (LASIX) 40 MG tablet TAKE 1 TABLET (40 MG TOTAL) BY MOUTH DAILY.  Marland Kitchen gabapentin (NEURONTIN) 300 MG capsule TAKE 2 CAPSULE BY MOUTH TID  . isosorbide dinitrate (ISORDIL) 10 MG tablet Take 1 tablet (10 mg total) by mouth 2 (two) times daily.  Marland Kitchen levETIRAcetam (KEPPRA) 500 MG tablet TAKE 2 TABLETS BY MOUTH 2 TIMES DAILY  . loratadine (CLARITIN) 10 MG tablet Take 10 mg daily as needed by mouth for  allergies.   . Multiple Vitamin (MULTIVITAMIN WITH MINERALS) TABS Take 1 tablet by mouth daily. Reported on 06/21/2015  . nitroGLYCERIN (NITROSTAT) 0.4 MG SL tablet Place 1 tablet (0.4 mg total) under the tongue every 5 (five) minutes as needed for chest pain.  . polyethylene glycol powder (GLYCOLAX/MIRALAX) powder Take 17 g by mouth daily as needed. (Patient taking differently: Take 17 g daily as needed by mouth for moderate constipation. )  . traZODone (DESYREL) 50 MG tablet TAKE 1 TO 3 TABLETS AT BEDTIME  . fentaNYL (DURAGESIC) 75 MCG/HR Place 1 patch onto the skin every 3 (three) days for 30 days.  . fentaNYL (DURAGESIC) 75 MCG/HR Place 1 patch onto the skin every 3 (three) days for 30 days.     Past Surgical History:  Procedure Laterality Date  . ABDOMINOPLASTY N/A 03/02/2017   Procedure: BACK LIPECTOMY;  Surgeon: Irene Limbo, MD;  Location: [place];  Service: Plastics;  Laterality: N/A;  . ABDOMINOPLASTY/PANNICULECTOMY N/A 03/02/2017   Procedure: ABDOMINOPLASTY/PANNICULECTOMY;  Surgeon: Irene Limbo, MD;  Location: [place];  Service: Plastics;  Laterality: N/A;  . BREAST SURGERY     " uplift"  . CATARACT EXTRACTION Right   . CATARACT EXTRACTION W/ INTRAOCULAR LENS  IMPLANT, BILATERAL    . CHOLECYSTECTOMY    . COLONOSCOPY    . ENDOVENOUS ABLATION SAPHENOUS VEIN W/ LASER Right 09-08-2013   EVLA right small saphenous vein and stab phlebectomy 10-20 incisions right leg by Curt Jews MD  . GASTRIC BYPASS  2007   Says she had lost 345 lbs  . LYMPH NODE DISSECTION Left    Resected  . lymph node removed from stomach    . MEDIAL PARTIAL KNEE REPLACEMENT  05/10/14   left knee  . MEDIAL PARTIAL KNEE REPLACEMENT Left 06/04/2015   [facility]  . SKIN SURGERY     Removal of excess skin  . TOOTH EXTRACTION Right 02/20/2017  . VENA CAVA FILTER PLACEMENT    . VENOUS THROMBECTOMY     Blood clot resection from right arm    Family History  Problem Relation Age of Onset  . Liver cancer  Father   . Diabetes Father   . Kidney cancer Father   . Heart failure Brother   . Heart attack Brother   . Tuberculosis Maternal Grandmother   . Bone cancer Maternal Grandfather   . Diabetes Paternal Uncle   . Lung cancer Mother   . Coronary artery disease Neg Hx   . Sudden death Neg Hx   . Cardiomyopathy Neg Hx   . Esophageal cancer Neg Hx   . Stomach cancer Neg Hx     New complaints: None today  Social history: Lives with her husband One of her daughters just moved back home with her partner and child.    Review of Systems  Constitutional: Negative for activity change and appetite change.  HENT: Negative.   Eyes: Negative for pain.  Respiratory:  Negative for shortness of breath.   Cardiovascular: Negative for chest pain, palpitations and leg swelling.  Gastrointestinal: Negative for abdominal pain.  Endocrine: Negative for polydipsia.  Genitourinary: Negative.   Musculoskeletal: Positive for arthralgias (right shoulder and arm).  Skin: Negative for rash.  Neurological: Negative for dizziness, weakness and headaches.  Hematological: Does not bruise/bleed easily.  Psychiatric/Behavioral: Negative.   All other systems reviewed and are negative.      Objective:   Physical Exam Vitals signs and nursing note reviewed.  Constitutional:      General: She is not in acute distress.    Appearance: Normal appearance. She is well-developed.  HENT:     Head: Normocephalic.     Nose: Nose normal.  Eyes:     Pupils: Pupils are equal, round, and reactive to light.  Neck:     Musculoskeletal: Normal range of motion and neck supple.     Vascular: No carotid bruit or JVD.  Cardiovascular:     Rate and Rhythm: Normal rate and regular rhythm.     Heart sounds: Normal heart sounds.  Pulmonary:     Effort: Pulmonary effort is normal. No respiratory distress.     Breath sounds: Normal breath sounds. No wheezing or rales.  Chest:     Chest wall: No tenderness.  Abdominal:      General: Bowel sounds are normal. There is no distension or abdominal bruit.     Palpations: Abdomen is soft. There is no hepatomegaly, splenomegaly, mass or pulsatile mass.     Tenderness: There is no abdominal tenderness.  Musculoskeletal: Normal range of motion.     Comments: FROM of right shoulder with pain on full abduction Grips equal bil  Lymphadenopathy:     Cervical: No cervical adenopathy.  Skin:    General: Skin is warm and dry.  Neurological:     Mental Status: She is alert and oriented to person, place, and time.     Deep Tendon Reflexes: Reflexes are normal and symmetric.  Psychiatric:        Behavior: Behavior normal.        Thought Content: Thought content normal.        Judgment: Judgment normal.    BP 93/63   Pulse 66   Temp 99.5 F (37.5 C) (Oral)   Ht 5\' 1"  (1.549 m)   Wt 199 lb (90.3 kg)   BMI 37.60 kg/m        Assessment & Plan:  Angela Michael comes in today with chief complaint of Medical Management of Chronic Issues   Diagnosis and orders addressed:  1. SVT (supraventricular tachycardia) (HCC) Keep diary of episodes for cardiology  2. Asymptomatic varicose veins of both lower extremities Wear compression hose when on feet alot  3. Hyperlipidemia with target LDL less than 100 Low fat diet  4. Hypokalemia  5. Depression, unspecified depression type Stress management  6. Seizures ([place]) Continue keppra as rx  7. Non-Hodgkin's lymphoma, unspecified body region, unspecified non-Hodgkin lymphoma type [facility]) Keep follow up with oncology  8. Insomnia, unspecified type Continue trazadone as rx  9. Peripheral edema Elevate legs when sitting  10. Thrombocytopenia ([place]) Labs pending  11. Neuropathy of right upper extremity - fentaNYL (DURAGESIC) 75 MCG/HR; Place 1 patch onto the skin every 3 (three) days.  Dispense: 10 patch; Refill: 0 - fentaNYL (DURAGESIC) 75 MCG/HR; Place 1 patch onto the skin every 3 (three) days.  Dispense: 10  patch; Refill: 0 - fentaNYL (DURAGESIC) 75 MCG/HR; Place  1 patch onto the skin every 3 (three) days.  Dispense: 10 patch; Refill: 0  12. Morbid obesity ([place]) Discussed diet and exercise for person with BMI >25 Will recheck weight in 3-6 months  13. Pancytopenia ([place])   Labs pending Health Maintenance reviewed Diet and exercise encouraged  Follow up plan: 3 months   Mary-Margaret Hassell Done, FNP

## 2018-11-08 NOTE — Patient Instructions (Signed)

## 2018-11-09 LAB — CMP14+EGFR
ALT: 32 IU/L (ref 0–32)
AST: 31 IU/L (ref 0–40)
Albumin/Globulin Ratio: 1.9 (ref 1.2–2.2)
Albumin: 3.8 g/dL (ref 3.8–4.9)
Alkaline Phosphatase: 128 IU/L — ABNORMAL HIGH (ref 39–117)
BUN/Creatinine Ratio: 17 (ref 9–23)
BUN: 13 mg/dL (ref 6–24)
Bilirubin Total: 0.9 mg/dL (ref 0.0–1.2)
CO2: 27 mmol/L (ref 20–29)
Calcium: 8.7 mg/dL (ref 8.7–10.2)
Chloride: 102 mmol/L (ref 96–106)
Creatinine, Ser: 0.76 mg/dL (ref 0.57–1.00)
GFR calc Af Amer: 101 mL/min/{1.73_m2} (ref >59)
GFR calc non Af Amer: 88 mL/min/{1.73_m2} (ref >59)
Globulin, Total: 2 g/dL (ref 1.5–4.5)
Glucose: 81 mg/dL (ref 65–99)
Potassium: 4.4 mmol/L (ref 3.5–5.2)
Sodium: 143 mmol/L (ref 134–144)
Total Protein: 5.8 g/dL — ABNORMAL LOW (ref 6.0–8.5)

## 2018-11-09 LAB — LIPID PANEL
Chol/HDL Ratio: 2.5 ratio (ref 0.0–4.4)
Cholesterol, Total: 122 mg/dL (ref 100–199)
HDL: 48 mg/dL (ref >39)
LDL Calculated: 57 mg/dL (ref 0–99)
Triglycerides: 83 mg/dL (ref 0–149)
VLDL Cholesterol Cal: 17 mg/dL (ref 5–40)

## 2018-11-29 ENCOUNTER — Other Ambulatory Visit: Payer: Self-pay | Admitting: Nurse Practitioner

## 2018-11-29 DIAGNOSIS — G5691 Unspecified mononeuropathy of right upper limb: Secondary | ICD-10-CM

## 2018-12-02 ENCOUNTER — Ambulatory Visit: Payer: Medicaid Other | Admitting: Nurse Practitioner

## 2018-12-18 ENCOUNTER — Other Ambulatory Visit: Payer: Self-pay | Admitting: Nurse Practitioner

## 2018-12-29 ENCOUNTER — Other Ambulatory Visit: Payer: Self-pay | Admitting: Nurse Practitioner

## 2018-12-29 DIAGNOSIS — G5691 Unspecified mononeuropathy of right upper limb: Secondary | ICD-10-CM

## 2019-01-12 ENCOUNTER — Other Ambulatory Visit: Payer: Self-pay

## 2019-01-13 ENCOUNTER — Ambulatory Visit: Payer: Self-pay | Admitting: Nurse Practitioner

## 2019-01-17 ENCOUNTER — Other Ambulatory Visit: Payer: Self-pay

## 2019-01-18 ENCOUNTER — Other Ambulatory Visit: Payer: Self-pay

## 2019-01-18 ENCOUNTER — Encounter: Payer: Self-pay | Admitting: Nurse Practitioner

## 2019-01-18 ENCOUNTER — Ambulatory Visit: Payer: Medicaid Other | Admitting: Nurse Practitioner

## 2019-01-18 VITALS — BP 142/86 | HR 51 | Temp 98.9°F | Resp 20 | Ht 61.0 in | Wt 195.0 lb

## 2019-01-18 DIAGNOSIS — F3341 Major depressive disorder, recurrent, in partial remission: Secondary | ICD-10-CM

## 2019-01-18 DIAGNOSIS — G5691 Unspecified mononeuropathy of right upper limb: Secondary | ICD-10-CM | POA: Diagnosis not present

## 2019-01-18 DIAGNOSIS — E876 Hypokalemia: Secondary | ICD-10-CM

## 2019-01-18 DIAGNOSIS — I471 Supraventricular tachycardia, unspecified: Secondary | ICD-10-CM

## 2019-01-18 DIAGNOSIS — R600 Localized edema: Secondary | ICD-10-CM

## 2019-01-18 DIAGNOSIS — F3342 Major depressive disorder, recurrent, in full remission: Secondary | ICD-10-CM

## 2019-01-18 DIAGNOSIS — E785 Hyperlipidemia, unspecified: Secondary | ICD-10-CM | POA: Diagnosis not present

## 2019-01-18 DIAGNOSIS — F5101 Primary insomnia: Secondary | ICD-10-CM

## 2019-01-18 DIAGNOSIS — R609 Edema, unspecified: Secondary | ICD-10-CM

## 2019-01-18 DIAGNOSIS — Z23 Encounter for immunization: Secondary | ICD-10-CM

## 2019-01-18 DIAGNOSIS — D61818 Other pancytopenia: Secondary | ICD-10-CM

## 2019-01-18 DIAGNOSIS — G4700 Insomnia, unspecified: Secondary | ICD-10-CM

## 2019-01-18 DIAGNOSIS — C8590 Non-Hodgkin lymphoma, unspecified, unspecified site: Secondary | ICD-10-CM

## 2019-01-18 DIAGNOSIS — R569 Unspecified convulsions: Secondary | ICD-10-CM

## 2019-01-18 MED ORDER — FUROSEMIDE 40 MG PO TABS: ORAL_TABLET | ORAL | 5 refills | Status: DC

## 2019-01-18 MED ORDER — FLUOXETINE HCL 40 MG PO CAPS: 80.0000 mg | ORAL_CAPSULE | Freq: Every day | ORAL | 5 refills | Status: DC

## 2019-01-18 MED ORDER — TRAZODONE HCL 50 MG PO TABS: ORAL_TABLET | ORAL | 1 refills | Status: DC

## 2019-01-18 MED ORDER — FENTANYL 75 MCG/HR TD PT72: 1.0000 | MEDICATED_PATCH | TRANSDERMAL | 0 refills | Status: DC

## 2019-01-18 MED ORDER — LEVETIRACETAM 500 MG PO TABS: ORAL_TABLET | ORAL | 5 refills | Status: DC

## 2019-01-18 MED ORDER — GABAPENTIN 300 MG PO CAPS: ORAL_CAPSULE | ORAL | 5 refills | Status: DC

## 2019-01-18 MED ORDER — ISOSORBIDE DINITRATE 10 MG PO TABS: 10.0000 mg | ORAL_TABLET | Freq: Two times a day (BID) | ORAL | 6 refills | Status: DC

## 2019-01-18 MED ORDER — INTEGRA F 125-1 MG PO CAPS: 1.0000 | ORAL_CAPSULE | Freq: Every day | ORAL | 1 refills | Status: DC

## 2019-01-18 MED ORDER — ATORVASTATIN CALCIUM 40 MG PO TABS: 40.0000 mg | ORAL_TABLET | Freq: Every day | ORAL | 5 refills | Status: DC

## 2019-01-18 MED ORDER — CYCLOBENZAPRINE HCL 5 MG PO TABS: ORAL_TABLET | ORAL | 1 refills | Status: DC

## 2019-01-18 NOTE — Progress Notes (Addendum)
Subjective:    Patient ID: Angela Michael, female    DOB: 08-16-1960, 58 y.o.   MRN: [id number]   Chief Complaint: Medical Management of Chronic Issues    HPI:  1. Hyperlipidemia with target LDL less than 100 Does not watch diet and does no exercise. BP Readings from Last 3 Encounters:  11/08/18 93/63  10/06/18 110/71  05/20/18 111/73   Lab Results  Component Value Date   CHOL 122 11/08/2018   HDL 48 11/08/2018   LDLCALC 57 11/08/2018   TRIG 83 11/08/2018   CHOLHDL 2.5 11/08/2018      2. Hypokalemia No c/o lower ext cramping Lab Results  Component Value Date   K 4.4 11/08/2018     3. SVT (supraventricular tachycardia) (HCC) Short runs of SVT- usually resolves in several minutes. Had cardiology visit in June. Also had CT of heart which looked good.  4. 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 She is on fentanyl 44mg patch every 3 days. Also on neurontin daily which helps Pain assessment: Cause of pain- neuropathy of right arm Pain location- right arm Pain on scale of 1-10- 7-10/10 today Frequency- daily What increases pain-nothing really What makes pain Better-rest Effects on ADL - none Any change in general medical condition-none  Current opioids rx- fentanyl 73m  # meds rx- n/a Effectiveness of current meds-helps Adverse reactions form pain meds-none Morphine equivalent- 180MEDD  Pill count performed-No Last drug screen - sept 2019 ( high risk q3m52moderate risk q6m,25mw risk yearly ) Urine drug screen today- Yes [place]  If yes were their any concerning findings? - no  No flowsheet data found.   Pain contract signed on: 01/18/19   5. Recurrent major depressive disorder, in partial remission (HCC) prozac daily and is doing well. Depression screen PHQ [facility] 01/18/2019 11/08/2018 08/19/2018  Decreased Interest 0 0 1  Down, Depressed, Hopeless 0 0 1  PHQ - 2 Score 0 0 2  Altered  sleeping - - 0  Tired, decreased energy - - 1  Change in appetite - - 0  Feeling bad or failure about yourself  - - 1  Trouble concentrating - - 0  Moving slowly or fidgety/restless - - 0  Suicidal thoughts - - 0  PHQ-9 Score - - 4  Difficult doing work/chores - - Not difficult at all  Some recent data might be hidden     6. Insomnia, unspecified type Is on no control substances and some nights doe snot sleep well.  7. Peripheral edema Has some edema on occasion when she is on her feet a lot.  8. Seizures (HCC)[place] recent seizure activity. Has been over a year.   9. Non-Hodgkin's lymphoma, unspecified body region, unspecified [facility] well. Has not had a recent oncology follow up.  10. Morbid obesity (HCC)[place] recent weight changes Wt Readings from Last 3 Encounters:  01/18/19 195 lb (88.5 kg)  11/08/18 199 lb (90.3 kg)  05/20/18 200 lb (90.7 kg)   BMI Readings from Last 3 Encounters:  01/18/19 36.84 kg/m  11/08/18 37.60 kg/m  05/20/18 37.79 kg/m       Outpatient Encounter Medications as of 01/18/2019  Medication Sig  . acetaminophen (TYLENOL) 500 MG tablet Take 1,000 mg every 6 (six) hours as needed by mouth for moderate pain or headache.  . asMarland Kitchenirin 81 MG tablet Take 81 mg by  mouth daily. Reported on 06/21/2015  . atorvastatin (LIPITOR) 40 MG tablet Take 1 tablet (40 mg total) by mouth at bedtime.  . cyanocobalamin (,VITAMIN B-12,) 1000 MCG/ML injection Inject 1 mL (1,000 mcg total) into the muscle every 30 (thirty) days.  . cyclobenzaprine (FLEXERIL) 5 MG tablet TAKE 1 TABLET BY MOUTH THREE TIMES A DAY AS NEEDED FOR MUSCLE SPASMS  . Fe Fum-FePoly-FA-Vit C-Vit B3 (INTEGRA F) 125-1 MG CAPS Take 1 capsule by mouth daily.  . fentaNYL (DURAGESIC) 75 MCG/HR Place 1 patch onto the skin every 3 (three) days.  . fentaNYL (DURAGESIC) 75 MCG/HR Place 1 patch onto the skin every 3 (three) days.  . fentaNYL (DURAGESIC) 75  MCG/HR Place 1 patch onto the skin every 3 (three) days.  Marland Kitchen FLUoxetine (PROZAC) 40 MG capsule Take 2 capsules (80 mg total) by mouth daily.  . fluticasone (FLONASE) 50 MCG/ACT nasal spray PLACE 2 SPRAYS INTO THE NOSE AS NEEDED. FOR ALLERGIES  . furosemide (LASIX) 40 MG tablet TAKE 1 TABLET (40 MG TOTAL) BY MOUTH DAILY.  Marland Kitchen gabapentin (NEURONTIN) 300 MG capsule TAKE 2 CAPSULE BY MOUTH TID  . isosorbide dinitrate (ISORDIL) 10 MG tablet Take 1 tablet (10 mg total) by mouth 2 (two) times daily.  Marland Kitchen levETIRAcetam (KEPPRA) 500 MG tablet TAKE 2 TABLETS BY MOUTH 2 TIMES DAILY  . loratadine (CLARITIN) 10 MG tablet Take 10 mg daily as needed by mouth for allergies.   . Multiple Vitamin (MULTIVITAMIN WITH MINERALS) TABS Take 1 tablet by mouth daily. Reported on 06/21/2015  . nitroGLYCERIN (NITROSTAT) 0.4 MG SL tablet Place 1 tablet (0.4 mg total) under the tongue every 5 (five) minutes as needed for chest pain.  . polyethylene glycol powder (GLYCOLAX/MIRALAX) powder Take 17 g by mouth daily as needed. (Patient taking differently: Take 17 g daily as needed by mouth for moderate constipation. )  . traZODone (DESYREL) 50 MG tablet TAKE 1 TO 3 TABLETS AT BEDTIME     Past Surgical History:  Procedure Laterality Date  . ABDOMINOPLASTY N/A 03/02/2017   Procedure: BACK LIPECTOMY;  Surgeon: Irene Limbo, MD;  Location: [place];  Service: Plastics;  Laterality: N/A;  . ABDOMINOPLASTY/PANNICULECTOMY N/A 03/02/2017   Procedure: ABDOMINOPLASTY/PANNICULECTOMY;  Surgeon: Irene Limbo, MD;  Location: [place];  Service: Plastics;  Laterality: N/A;  . BREAST SURGERY     " uplift"  . CATARACT EXTRACTION Right   . CATARACT EXTRACTION W/ INTRAOCULAR LENS  IMPLANT, BILATERAL    . CHOLECYSTECTOMY    . COLONOSCOPY    . ENDOVENOUS ABLATION SAPHENOUS VEIN W/ LASER Right 09-08-2013   EVLA right small saphenous vein and stab phlebectomy 10-20 incisions right leg by Curt Jews MD  . GASTRIC BYPASS  2007   Says she had lost  345 lbs  . LYMPH NODE DISSECTION Left    Resected  . lymph node removed from stomach    . MEDIAL PARTIAL KNEE REPLACEMENT  05/10/14   left knee  . MEDIAL PARTIAL KNEE REPLACEMENT Left 06/04/2015   [facility]  . SKIN SURGERY     Removal of excess skin  . TOOTH EXTRACTION Right 02/20/2017  . VENA CAVA FILTER PLACEMENT    . VENOUS THROMBECTOMY     Blood clot resection from right arm    Family History  Problem Relation Age of Onset  . Liver cancer Father   . Diabetes Father   . Kidney cancer Father   . Heart failure Brother   . Heart attack Brother   . Tuberculosis Maternal  Grandmother   . Bone cancer Maternal Grandfather   . Diabetes Paternal Uncle   . Lung cancer Mother   . Coronary artery disease Neg Hx   . Sudden death Neg Hx   . Cardiomyopathy Neg Hx   . Esophageal cancer Neg Hx   . Stomach cancer Neg Hx     New complaints: None today  Social history: Lives with her husband. Her children move in and out of her house.  Controlled substance contract: 01/18/19    Review of Systems  Constitutional: Negative for activity change and appetite change.  HENT: Negative.   Eyes: Negative for pain.  Respiratory: Positive for shortness of breath.   Cardiovascular: Negative for chest pain, palpitations and leg swelling.  Gastrointestinal: Negative for abdominal pain.  Endocrine: Negative for polydipsia.  Genitourinary: Negative.   Musculoskeletal: Positive for arthralgias and myalgias.  Skin: Negative for rash.  Neurological: Negative for dizziness, weakness and headaches.  Hematological: Does not bruise/bleed easily.  All other systems reviewed and are negative.      Objective:   Physical Exam Vitals signs and nursing note reviewed.  Constitutional:      General: She is not in acute distress.    Appearance: Normal appearance. She is well-developed.  HENT:     Head: Normocephalic.     Nose: Nose normal.  Eyes:     Pupils: Pupils are equal, round, and reactive to  light.  Neck:     Musculoskeletal: Normal range of motion and neck supple.     Vascular: No carotid bruit or JVD.  Cardiovascular:     Rate and Rhythm: Normal rate and regular rhythm.     Heart sounds: Normal heart sounds.  Pulmonary:     Effort: Pulmonary effort is normal. No respiratory distress.     Breath sounds: Normal breath sounds. No wheezing or rales.  Chest:     Chest wall: No tenderness.  Abdominal:     General: Bowel sounds are normal. There is no distension or abdominal bruit.     Palpations: Abdomen is soft. There is no hepatomegaly, splenomegaly, mass or pulsatile mass.     Tenderness: There is no abdominal tenderness.  Musculoskeletal: Normal range of motion.  Lymphadenopathy:     Cervical: No cervical adenopathy.  Skin:    General: Skin is warm and dry.  Neurological:     Mental Status: She is alert and oriented to person, place, and time.     Deep Tendon Reflexes: Reflexes are normal and symmetric.  Psychiatric:        Behavior: Behavior normal.        Thought Content: Thought content normal.        Judgment: Judgment normal.     BP (!) 142/86   Pulse (!) 51   Temp 98.9 F (37.2 C) (Temporal)   Resp 20   Ht 5' 1"  (1.549 m)   Wt 195 lb (88.5 kg)   SpO2 95%   BMI 36.84 kg/m       Assessment & Plan:  Demitria Hay comes in today with chief complaint of Medical Management of Chronic Issues   Diagnosis and orders addressed:  1. Hyperlipidemia with target LDL less than 100 Low fta diet - CMP14+EGFR - Lipid panel - atorvastatin (LIPITOR) 40 MG tablet; Take 1 tablet (40 mg total) by mouth at bedtime.  Dispense: 30 tablet; Refill: 5  2. Hypokalemia Labs pending  3. SVT (supraventricular tachycardia) (HCC) Avoid caffeine  4. Neuropathy of right upper  extremity - fentaNYL (DURAGESIC) 75 MCG/HR; Place 1 patch onto the skin every 3 (three) days.  Dispense: 10 patch; Refill: 0 - fentaNYL (DURAGESIC) 75 MCG/HR; Place 1 patch onto the skin every 3  (three) days.  Dispense: 10 patch; Refill: 0 - fentaNYL (DURAGESIC) 75 MCG/HR; Place 1 patch onto the skin every 3 (three) days.  Dispense: 10 patch; Refill: 0 - ToxASSURE Select 13 (MW), Urine - cyclobenzaprine (FLEXERIL) 5 MG tablet; TAKE 1 TABLET BY MOUTH THREE TIMES A DAY AS NEEDED FOR MUSCLE SPASMS  Dispense: 90 tablet; Refill: 1 - gabapentin (NEURONTIN) 300 MG capsule; TAKE 2 CAPSULE BY MOUTH TID  Dispense: 180 capsule; Refill: 5  5. Recurrent major depressive disorder, in partial remission (HCC) Stress management - FLUoxetine (PROZAC) 40 MG capsule; Take 2 capsules (80 mg total) by mouth daily.  Dispense: 60 capsule; Refill: 5  6. Primary insomnia bedtime routine - traZODone (DESYREL) 50 MG tablet; TAKE 1 TO 3 TABLETS AT BEDTIME  Dispense: 270 tablet; Refill: 1  7. Peripheral edema elevtae legs when sitting - furosemide (LASIX) 40 MG tablet; TAKE 1 TABLET (40 MG TOTAL) BY MOUTH DAILY.  Dispense: 30 tablet; Refill: 5  8. Seizures (HCC) - levETIRAcetam (KEPPRA) 500 MG tablet; TAKE 2 TABLETS BY MOUTH 2 TIMES DAILY  Dispense: 120 tablet; Refill: 5  9. Non-Hodgkin's lymphoma, unspecified body region, unspecified non-Hodgkin lymphoma type ([place]) Need to follow up with oncology  10. Morbid obesity ([place]) Discussed diet and exercise for person with BMI >25 Will recheck weight in 3-6 months  11. Pancytopenia (HCC) - Fe Fum-FePoly-FA-Vit C-Vit B3 (INTEGRA F) 125-1 MG CAPS; Take 1 capsule by mouth daily.  Dispense: 90 capsule; Refill: 1     Labs pending Health Maintenance reviewed Diet and exercise encouraged  Follow up plan: 4 months   Mary-Margaret Hassell Done, FNP

## 2019-01-19 LAB — CMP14+EGFR
ALT: 31 IU/L (ref 0–32)
AST: 27 IU/L (ref 0–40)
Albumin/Globulin Ratio: 1.8 (ref 1.2–2.2)
Albumin: 4 g/dL (ref 3.8–4.9)
Alkaline Phosphatase: 149 IU/L — ABNORMAL HIGH (ref 39–117)
BUN/Creatinine Ratio: 14 (ref 9–23)
BUN: 13 mg/dL (ref 6–24)
Bilirubin Total: 0.9 mg/dL (ref 0.0–1.2)
CO2: 25 mmol/L (ref 20–29)
Calcium: 9 mg/dL (ref 8.7–10.2)
Chloride: 101 mmol/L (ref 96–106)
Creatinine, Ser: 0.92 mg/dL (ref 0.57–1.00)
GFR calc Af Amer: 80 mL/min/{1.73_m2} (ref >59)
GFR calc non Af Amer: 70 mL/min/{1.73_m2} (ref >59)
Globulin, Total: 2.2 g/dL (ref 1.5–4.5)
Glucose: 83 mg/dL (ref 65–99)
Potassium: 4.5 mmol/L (ref 3.5–5.2)
Sodium: 140 mmol/L (ref 134–144)
Total Protein: 6.2 g/dL (ref 6.0–8.5)

## 2019-01-19 LAB — LIPID PANEL
Chol/HDL Ratio: 2.6 ratio (ref 0.0–4.4)
Cholesterol, Total: 149 mg/dL (ref 100–199)
HDL: 58 mg/dL (ref >39)
LDL Chol Calc (NIH): 72 mg/dL (ref 0–99)
Triglycerides: 106 mg/dL (ref 0–149)
VLDL Cholesterol Cal: 19 mg/dL (ref 5–40)

## 2019-01-21 LAB — TOXASSURE SELECT 13 (MW), URINE

## 2019-02-03 ENCOUNTER — Encounter: Payer: Self-pay | Admitting: Nurse Practitioner

## 2019-04-15 LAB — HM PAP SMEAR

## 2019-04-16 ENCOUNTER — Other Ambulatory Visit: Payer: Self-pay | Admitting: Nurse Practitioner

## 2019-04-16 DIAGNOSIS — G5691 Unspecified mononeuropathy of right upper limb: Secondary | ICD-10-CM

## 2019-05-20 ENCOUNTER — Ambulatory Visit (INDEPENDENT_AMBULATORY_CARE_PROVIDER_SITE_OTHER): Payer: Medicaid Other | Admitting: Nurse Practitioner

## 2019-05-20 ENCOUNTER — Encounter: Payer: Self-pay | Admitting: Nurse Practitioner

## 2019-05-20 ENCOUNTER — Other Ambulatory Visit: Payer: Self-pay

## 2019-05-20 VITALS — BP 109/82 | HR 70 | Temp 97.8°F | Resp 20 | Ht 61.0 in | Wt 191.0 lb

## 2019-05-20 DIAGNOSIS — D61818 Other pancytopenia: Secondary | ICD-10-CM | POA: Diagnosis not present

## 2019-05-20 DIAGNOSIS — C8590 Non-Hodgkin lymphoma, unspecified, unspecified site: Secondary | ICD-10-CM

## 2019-05-20 DIAGNOSIS — G5691 Unspecified mononeuropathy of right upper limb: Secondary | ICD-10-CM

## 2019-05-20 DIAGNOSIS — F5101 Primary insomnia: Secondary | ICD-10-CM

## 2019-05-20 DIAGNOSIS — E785 Hyperlipidemia, unspecified: Secondary | ICD-10-CM

## 2019-05-20 DIAGNOSIS — F3341 Major depressive disorder, recurrent, in partial remission: Secondary | ICD-10-CM

## 2019-05-20 DIAGNOSIS — R609 Edema, unspecified: Secondary | ICD-10-CM

## 2019-05-20 DIAGNOSIS — I471 Supraventricular tachycardia: Secondary | ICD-10-CM

## 2019-05-20 DIAGNOSIS — R6510 Systemic inflammatory response syndrome (SIRS) of non-infectious origin without acute organ dysfunction: Secondary | ICD-10-CM

## 2019-05-20 DIAGNOSIS — E876 Hypokalemia: Secondary | ICD-10-CM | POA: Diagnosis not present

## 2019-05-20 DIAGNOSIS — R569 Unspecified convulsions: Secondary | ICD-10-CM

## 2019-05-20 MED ORDER — FUROSEMIDE 40 MG PO TABS: ORAL_TABLET | ORAL | 5 refills | Status: DC

## 2019-05-20 MED ORDER — ISOSORBIDE DINITRATE 10 MG PO TABS: 10.0000 mg | ORAL_TABLET | Freq: Two times a day (BID) | ORAL | 6 refills | Status: DC

## 2019-05-20 MED ORDER — ATORVASTATIN CALCIUM 40 MG PO TABS: 40.0000 mg | ORAL_TABLET | Freq: Every day | ORAL | 5 refills | Status: DC

## 2019-05-20 MED ORDER — FLUOXETINE HCL 40 MG PO CAPS: 80.0000 mg | ORAL_CAPSULE | Freq: Every day | ORAL | 5 refills | Status: DC

## 2019-05-20 MED ORDER — GABAPENTIN 300 MG PO CAPS: ORAL_CAPSULE | ORAL | 5 refills | Status: DC

## 2019-05-20 MED ORDER — TRAZODONE HCL 50 MG PO TABS: ORAL_TABLET | ORAL | 1 refills | Status: DC

## 2019-05-20 MED ORDER — INTEGRA F 125-1 MG PO CAPS: 1.0000 | ORAL_CAPSULE | Freq: Every day | ORAL | 1 refills | Status: DC

## 2019-05-20 MED ORDER — LEVETIRACETAM 500 MG PO TABS: ORAL_TABLET | ORAL | 5 refills | Status: DC

## 2019-05-20 MED ORDER — CYCLOBENZAPRINE HCL 5 MG PO TABS: 5.0000 mg | ORAL_TABLET | Freq: Every day | ORAL | 1 refills | Status: DC

## 2019-05-20 NOTE — Addendum Note (Signed)
Addended by: Chevis Pretty on: 05/20/2019 11:06 AM   Modules accepted: Orders

## 2019-05-20 NOTE — Progress Notes (Signed)
Subjective:    Patient ID: Angela Michael, female    DOB: 09-23-1960, 59 y.o.   MRN: [id number]   Chief Complaint: Medical Management of Chronic Issues    HPI:  1. Hyperlipidemia with target LDL less than 100 Does not watch diet and does little to no exercise. Lab Results  Component Value Date   CHOL 149 01/18/2019   HDL 58 01/18/2019   LDLCALC 72 01/18/2019   TRIG 106 01/18/2019   CHOLHDL 2.6 01/18/2019     2. Hypokalemia No c/o lower ext cramps. Takes daily potassium supplement Lab Results  Component Value Date   K 4.5 01/18/2019    3. Pancytopenia ([place]) Is on a daily iron supplement. Has some fatigue Lab Results  Component Value Date   HGB 12.9 05/20/2018     4. SVT (supraventricular tachycardia) (HCC) Denies any episode since last visit  5. Seizures ([place]) No recent seizure activity..has follow up with neurology this month.  6. Non-Hodgkin's lymphoma, unspecified body region, unspecified non-Hodgkin lymphoma type ([place]) Is currently in remission. She still sees oncology every 6 months  7. SIRS (systemic inflammatory response syndrome) (HCC) has achiness all over  8. Insomnia, unspecified type Is on trazadone to sleep at night  9. Peripheral edema Has daily edema of lower ext. Resolves some during the night  10. Recurrent major depressive disorder, in partial remission ([place]) Is on prozac daily and is doing well most days. Depression screen [facility] 2/9 05/20/2019 01/18/2019 11/08/2018  Decreased Interest 0 0 0  Down, Depressed, Hopeless 0 0 0  PHQ - 2 Score 0 0 0  Altered sleeping - - -  Tired, decreased energy - - -  Change in appetite - - -  Feeling bad or failure about yourself  - - -  Trouble concentrating - - -  Moving slowly or fidgety/restless - - -  Suicidal thoughts - - -  PHQ-9 Score - - -  Difficult doing work/chores - - -  Some recent data might be hidden     11. Neuropathy of right upper extremity Was on durgesic patch but she broke  pain contract and was sent a letter that she can no longer get controlled substances form this office.   Wt Readings from Last 3 Encounters:  05/20/19 191 lb (86.6 kg)  01/18/19 195 lb (88.5 kg)  11/08/18 199 lb (90.3 kg)     Outpatient Encounter Medications as of 05/20/2019  Medication Sig  . acetaminophen (TYLENOL) 500 MG tablet Take 1,000 mg every 6 (six) hours as needed by mouth for moderate pain or headache.  Marland Kitchen aspirin 81 MG tablet Take 81 mg by mouth daily. Reported on 06/21/2015  . atorvastatin (LIPITOR) 40 MG tablet Take 1 tablet (40 mg total) by mouth at bedtime.  . cyanocobalamin (,VITAMIN B-12,) 1000 MCG/ML injection Inject 1 mL (1,000 mcg total) into the muscle every 30 (thirty) days.  . cyclobenzaprine (FLEXERIL) 5 MG tablet TAKE 1 TABLET BY MOUTH THREE TIMES A DAY AS NEEDED FOR MUSCLE SPASMS  . Fe Fum-FePoly-FA-Vit C-Vit B3 (INTEGRA F) 125-1 MG CAPS Take 1 capsule by mouth daily.  . fentaNYL (DURAGESIC) 75 MCG/HR Place 1 patch onto the skin every 3 (three) days.  . fentaNYL (DURAGESIC) 75 MCG/HR Place 1 patch onto the skin every 3 (three) days.  . fentaNYL (DURAGESIC) 75 MCG/HR Place 1 patch onto the skin every 3 (three) days.  Marland Kitchen FLUoxetine (PROZAC) 40 MG capsule Take 2 capsules (80 mg total) by mouth daily.  Marland Kitchen  fluticasone (FLONASE) 50 MCG/ACT nasal spray PLACE 2 SPRAYS INTO THE NOSE AS NEEDED. FOR ALLERGIES  . furosemide (LASIX) 40 MG tablet TAKE 1 TABLET (40 MG TOTAL) BY MOUTH DAILY.  Marland Kitchen gabapentin (NEURONTIN) 300 MG capsule TAKE 2 CAPSULE BY MOUTH TID  . isosorbide dinitrate (ISORDIL) 10 MG tablet Take 1 tablet (10 mg total) by mouth 2 (two) times daily.  Marland Kitchen levETIRAcetam (KEPPRA) 500 MG tablet TAKE 2 TABLETS BY MOUTH 2 TIMES DAILY  . loratadine (CLARITIN) 10 MG tablet Take 10 mg daily as needed by mouth for allergies.   . Multiple Vitamin (MULTIVITAMIN WITH MINERALS) TABS Take 1 tablet by mouth daily. Reported on 06/21/2015  . nitroGLYCERIN (NITROSTAT) 0.4 MG SL tablet Place 1  tablet (0.4 mg total) under the tongue every 5 (five) minutes as needed for chest pain.  . polyethylene glycol powder (GLYCOLAX/MIRALAX) powder Take 17 g by mouth daily as needed. (Patient taking differently: Take 17 g daily as needed by mouth for moderate constipation. )  . traZODone (DESYREL) 50 MG tablet TAKE 1 TO 3 TABLETS AT BEDTIME     Past Surgical History:  Procedure Laterality Date  . ABDOMINOPLASTY N/A 03/02/2017   Procedure: BACK LIPECTOMY;  Surgeon: Irene Limbo, MD;  Location: [place];  Service: Plastics;  Laterality: N/A;  . ABDOMINOPLASTY/PANNICULECTOMY N/A 03/02/2017   Procedure: ABDOMINOPLASTY/PANNICULECTOMY;  Surgeon: Irene Limbo, MD;  Location: [place];  Service: Plastics;  Laterality: N/A;  . BREAST SURGERY     " uplift"  . CATARACT EXTRACTION Right   . CATARACT EXTRACTION W/ INTRAOCULAR LENS  IMPLANT, BILATERAL    . CHOLECYSTECTOMY    . COLONOSCOPY    . ENDOVENOUS ABLATION SAPHENOUS VEIN W/ LASER Right 09-08-2013   EVLA right small saphenous vein and stab phlebectomy 10-20 incisions right leg by Curt Jews MD  . GASTRIC BYPASS  2007   Says she had lost 345 lbs  . LYMPH NODE DISSECTION Left    Resected  . lymph node removed from stomach    . MEDIAL PARTIAL KNEE REPLACEMENT  05/10/14   left knee  . MEDIAL PARTIAL KNEE REPLACEMENT Left 06/04/2015   [facility]  . SKIN SURGERY     Removal of excess skin  . TOOTH EXTRACTION Right 02/20/2017  . VENA CAVA FILTER PLACEMENT    . VENOUS THROMBECTOMY     Blood clot resection from right arm    Family History  Problem Relation Age of Onset  . Liver cancer Father   . Diabetes Father   . Kidney cancer Father   . Heart failure Brother   . Heart attack Brother   . Tuberculosis Maternal Grandmother   . Bone cancer Maternal Grandfather   . Diabetes Paternal Uncle   . Lung cancer Mother   . Coronary artery disease Neg Hx   . Sudden death Neg Hx   . Cardiomyopathy Neg Hx   . Esophageal cancer Neg Hx   . Stomach  cancer Neg Hx     New complaints: None today  Social history: Lives with her husband.   Controlled substance contract: n/a    Review of Systems  Constitutional: Negative for diaphoresis.  Eyes: Negative for pain.  Respiratory: Negative for shortness of breath.   Cardiovascular: Negative for chest pain, palpitations and leg swelling.  Gastrointestinal: Negative for abdominal pain.  Endocrine: Negative for polydipsia.  Skin: Negative for rash.  Neurological: Negative for dizziness, weakness and headaches.  Hematological: Does not bruise/bleed easily.  All other systems reviewed and are  negative.      Objective:   Physical Exam Vitals and nursing note reviewed.  Constitutional:      General: She is not in acute distress.    Appearance: Normal appearance. She is well-developed.  HENT:     Head: Normocephalic.     Nose: Nose normal.  Eyes:     Pupils: Pupils are equal, round, and reactive to light.  Neck:     Vascular: No carotid bruit or JVD.  Cardiovascular:     Rate and Rhythm: Normal rate and regular rhythm.     Heart sounds: Normal heart sounds.  Pulmonary:     Effort: Pulmonary effort is normal. No respiratory distress.     Breath sounds: Normal breath sounds. No wheezing or rales.  Chest:     Chest wall: No tenderness.  Abdominal:     General: Bowel sounds are normal. There is no distension or abdominal bruit.     Palpations: Abdomen is soft. There is no hepatomegaly, splenomegaly, mass or pulsatile mass.     Tenderness: There is no abdominal tenderness.  Musculoskeletal:        General: Normal range of motion.     Cervical back: Normal range of motion and neck supple.  Lymphadenopathy:     Cervical: No cervical adenopathy.  Skin:    General: Skin is warm and dry.  Neurological:     Mental Status: She is alert and oriented to person, place, and time.     Deep Tendon Reflexes: Reflexes are normal and symmetric.  Psychiatric:        Behavior: Behavior  normal.        Thought Content: Thought content normal.        Judgment: Judgment normal.    BP 109/82   Pulse 70   Temp 97.8 F (36.6 C) (Temporal)   Resp 20   Ht 5\' 1"  (1.549 m)   Wt 191 lb (86.6 kg)   SpO2 93%   BMI 36.09 kg/m         Assessment & Plan:  Rea Arizola comes in today with chief complaint of Medical Management of Chronic Issues   Diagnosis and orders addressed:  1. Hyperlipidemia with target LDL less than 100 Low fat diet - atorvastatin (LIPITOR) 40 MG tablet; Take 1 tablet (40 mg total) by mouth at bedtime.  Dispense: 30 tablet; Refill: 5  2. Hypokalemia Continue potsaaium supplements  3. Pancytopenia (HCC) - Fe Fum-FePoly-FA-Vit C-Vit B3 (INTEGRA F) 125-1 MG CAPS; Take 1 capsule by mouth daily.  Dispense: 90 capsule; Refill: 1  4. SVT (supraventricular tachycardia) (HCC) Avoid cafeine - isosorbide dinitrate (ISORDIL) 10 MG tablet; Take 1 tablet (10 mg total) by mouth 2 (two) times daily.  Dispense: 60 tablet; Refill: 6  5. Seizures ([place]) Keep follow up with neurology - levETIRAcetam (KEPPRA) 500 MG tablet; TAKE 2 TABLETS BY MOUTH 2 TIMES DAILY  Dispense: 120 tablet; Refill: 5  6. Non-Hodgkin's lymphoma, unspecified body region, unspecified non-Hodgkin lymphoma type [facility]) Keep follow up with oncology  7. SIRS (systemic inflammatory response syndrome) (HCC)  8. Primary insomnia - traZODone (DESYREL) 50 MG tablet; TAKE 1 TO 3 TABLETS AT BEDTIME  Dispense: 270 tablet; Refill: 1  9. Peripheral edema Elevated legs when sitting - furosemide (LASIX) 40 MG tablet; TAKE 1 TABLET (40 MG TOTAL) BY MOUTH DAILY.  Dispense: 30 tablet; Refill: 5  10. Recurrent major depressive disorder, in partial remission (HCC) Stress management - FLUoxetine (PROZAC) 40 MG capsule; Take 2  capsules (80 mg total) by mouth daily.  Dispense: 60 capsule; Refill: 5  11. Neuropathy of right upper extremity - cyclobenzaprine (FLEXERIL) 5 MG tablet; Take 1 tablet (5 mg  total) by mouth at bedtime.  Dispense: 90 tablet; Refill: 1 - gabapentin (NEURONTIN) 300 MG capsule; TAKE 2 CAPSULE BY MOUTH TID  Dispense: 180 capsule; Refill: 5       Labs pending Health Maintenance reviewed Diet and exercise encouraged  Follow up plan: 6 months   Mary-Margaret Hassell Done, FNP

## 2019-05-20 NOTE — Addendum Note (Signed)
Addended by: Chevis Pretty on: 05/20/2019 03:20 PM   Modules accepted: Orders

## 2019-05-20 NOTE — Addendum Note (Signed)
Addended by: Rolena Infante on: 05/20/2019 12:03 PM   Modules accepted: Orders

## 2019-05-22 LAB — QUANTIFERON-TB GOLD PLUS
QuantiFERON Mitogen Value: 8.15 IU/mL
QuantiFERON Nil Value: 0.02 IU/mL
QuantiFERON TB1 Ag Value: 0.02 IU/mL
QuantiFERON TB2 Ag Value: 0.01 IU/mL
QuantiFERON-TB Gold Plus: NEGATIVE

## 2019-06-19 ENCOUNTER — Other Ambulatory Visit: Payer: Self-pay | Admitting: Nurse Practitioner

## 2019-06-19 DIAGNOSIS — G5691 Unspecified mononeuropathy of right upper limb: Secondary | ICD-10-CM

## 2019-06-20 ENCOUNTER — Other Ambulatory Visit: Payer: Self-pay | Admitting: Nurse Practitioner

## 2019-06-20 ENCOUNTER — Telehealth: Payer: Self-pay | Admitting: Nurse Practitioner

## 2019-06-20 NOTE — Telephone Encounter (Signed)
Flexeril was denied = can this be approved?

## 2019-06-20 NOTE — Telephone Encounter (Signed)
Pharmacy states there is a rx on file- patient aware.

## 2019-06-20 NOTE — Telephone Encounter (Signed)
Please call  pharmacy and check on flexeril- should have refill. Rx sent in on 05/20/19

## 2019-06-20 NOTE — Telephone Encounter (Signed)
Please let patient know flexeril was approved

## 2019-08-10 ENCOUNTER — Telehealth: Payer: Self-pay | Admitting: Family Medicine

## 2019-08-10 NOTE — Telephone Encounter (Signed)
Lmtcb.

## 2019-08-10 NOTE — Telephone Encounter (Signed)
I'm confused. It looks like pain management wrote this prescription on 07/13/2019. I don't see an ER visit. Where did she go?

## 2019-08-10 NOTE — Telephone Encounter (Signed)
They need it written to say start 1 capsule at night after 7 days take 2 capsules at night and continue. Was going to let it wait till mmm got back pharmacy said they needed it today. Please advise.

## 2019-08-10 NOTE — Telephone Encounter (Signed)
Spoke with patient and she states this has already been taken care of by her pain management  doctor. This encounter will be closed

## 2019-09-13 ENCOUNTER — Telehealth: Payer: Self-pay | Admitting: Nurse Practitioner

## 2019-09-13 DIAGNOSIS — D509 Iron deficiency anemia, unspecified: Secondary | ICD-10-CM

## 2019-09-13 NOTE — Telephone Encounter (Signed)
Ha to have new lab drawn to restart

## 2019-09-13 NOTE — Telephone Encounter (Signed)
Lab ordered appt made

## 2019-09-13 NOTE — Telephone Encounter (Signed)
Pt requesting rx for B12 injections be sent to the pharmacy. Pt states she used to do her own injections. Last rx for the B12 was in 2019, don't see any recent labs to check B12. Is it ok to refill or does pt need labs?

## 2019-09-13 NOTE — Telephone Encounter (Signed)
  Prescription Request  09/13/2019  What is the name of the medication or equipment? cyanocobalamin (,VITAMIN B-12,) 1000 MCG/ML injection   Have you contacted your pharmacy to request a refill? (if applicable) yes  Which pharmacy would you like this sent to? [facility]    Patient notified that their request is being sent to the clinical staff for review and that they should receive a response within 2 business days.

## 2019-10-31 ENCOUNTER — Other Ambulatory Visit: Payer: Self-pay | Admitting: Nurse Practitioner

## 2019-10-31 DIAGNOSIS — F5101 Primary insomnia: Secondary | ICD-10-CM

## 2019-11-14 DIAGNOSIS — Z029 Encounter for administrative examinations, unspecified: Secondary | ICD-10-CM

## 2019-11-17 ENCOUNTER — Ambulatory Visit: Payer: Self-pay | Admitting: Nurse Practitioner

## 2019-11-21 ENCOUNTER — Encounter: Payer: Self-pay | Admitting: Nurse Practitioner

## 2019-11-21 ENCOUNTER — Ambulatory Visit: Payer: Self-pay | Admitting: Nurse Practitioner

## 2019-12-22 ENCOUNTER — Encounter: Payer: Self-pay | Admitting: Nurse Practitioner

## 2019-12-22 ENCOUNTER — Other Ambulatory Visit: Payer: Self-pay

## 2019-12-22 ENCOUNTER — Ambulatory Visit: Payer: Medicaid Other | Admitting: Nurse Practitioner

## 2019-12-22 VITALS — BP 119/73 | HR 51 | Temp 97.8°F | Resp 20 | Ht 61.0 in | Wt 203.0 lb

## 2019-12-22 DIAGNOSIS — D510 Vitamin B12 deficiency anemia due to intrinsic factor deficiency: Secondary | ICD-10-CM

## 2019-12-22 MED ORDER — CYANOCOBALAMIN 1000 MCG/ML IJ SOLN: 1000.0000 ug | INTRAMUSCULAR | 1 refills | Status: DC

## 2019-12-22 NOTE — Patient Instructions (Signed)
Pernicious Anemia  Anemia is a condition in which the body does not have enough red blood cells or hemoglobin. Hemoglobin is a substance in red blood cells that carries oxygen. Pernicious anemia happens when the body does not have enough of a protein made in the stomach called intrinsic factor (IF). Without enough IF, your digestive tract cannot absorb enough of the vitamin B12 that your body needs to make red blood cells. Normally, you can get enough vitamin B12 from eating foods such as meat, poultry, eggs, and dairy products. If you have pernicious anemia, you do not absorb enough vitamin B12 from your diet, and anemia develops over time. When you have anemia, your organs may not work properly and you may feel very tired. Untreated pernicious anemia can lead to severe symptoms of anemia, including chest pain, heart failure, and permanent nervous system damage. What are the causes? The cause of this condition is not known. Pernicious anemia is believed to be an autoimmune disease. When you have an autoimmune disease, your body's defense system (immune system) mistakenly attacks normal cells in your body. With pernicious anemia, the immune system attacks cells that line the inside of the stomach (parietal cells). These cells secrete stomach acids and IF. What increases the risk? You are more likely to develop this condition if you:  Are older than age 40.  Have a family history of pernicious anemia.  Are of Northern European or Scandin[place] descent.  Have another type of autoimmune disease. What are the signs or symptoms? Pernicious anemia symptoms may take many years to develop. This condition usually does not cause symptoms until after a person is older than age 30. Signs and symptoms due to anemia include:  Tiredness.  Light-headedness.  A sore tongue or a burning sensation on the tongue.  A smooth red tongue.  Shortness of breath.  Fast heartbeat.  Chest pain. Stomach symptoms  due to gradual loss of parietal cells include:  Nausea or vomiting.  Heartburn.  Weight loss without trying.  Diarrhea. Signs and symptoms due to nervous system damage include:  Dizziness.  Being unsteady while walking.  Tingling or numbness of hands and feet.  Irritability.  Depression.  Hallucinations or delusions.  Loss of smell. How is this diagnosed? In many cases, anemia may be found after you have a routine blood test. This condition may also be diagnosed based on your symptoms, having a family history of the condition, and a physical exam. You may also have tests to confirm the diagnosis. These may include blood tests to:  Count the different types of blood cells (complete blood count or CBC).  Test for different types of anemia.  Check for proteins made by your immune system (antibodies) that attack parietal cells. Almost all people with pernicious anemia have these antibodies.  Check your B12 level. How is this treated? This condition may be treated with vitamin B12 replacement. This may include:  Injections of vitamin B12. This is the most common treatment. You will also have your blood level of B12 checked regularly. After you have a normal level of vitamin B12, and the anemia has been corrected, the injections can be given about once a month.  Taking a pill by mouth that contains a large dose of vitamin B12.  Using a spray that you breathe in through your nose (nasal spray). A vitamin B12 nasal spray may be used to treat people who are not able to swallow supplements.  Long-term monitoring and regular visits to a   health care provider. If the condition is detected and treatment is started in the early stages, most people do not develop complications. Treatment reverses the condition and prevents future anemia, but it must be continued for life. Having pernicious anemia also puts you at higher risk for stomach cancer. Follow these instructions at home:  Take  over-the-counter and prescription medicines only as told by your health care provider.  Return to your normal activities as told by your health care provider. Ask your health care provider what activities are safe for you.  Keep all follow-up visits as told by your health care provider. This is important. Contact a health care provider if:  You develop new symptoms.  Your symptoms return or get worse after treatment.  You have stomach pain.  You feel full after eating a small meal.  You have trouble swallowing. Get help right away if:  You have chest pain.  You have a rapid heartbeat.  You have dizziness or you faint.  You have trouble breathing.  You have a feeling as if your heart is skipping beats or fluttering (palpitations).  You have pain, swelling, or redness in an arm or leg.  You cough up blood. These symptoms may represent a serious problem that is an emergency. Do not wait to see if the symptoms will go away. Get medical help right away. Call your local emergency services (911 in the [place] Do not drive yourself to the hospital. Summary  Pernicious anemia happens when your body cannot make enough red blood cells due to vitamin B12 deficiency.  This condition is usually caused by an autoimmune disease that attacks stomach cells that produce intrinsic factor (IF). You need IF to absorb vitamin B12.  Pernicious anemia symptoms may take many years to develop.  This condition is sometimes discovered during routine blood testing.  Treatment of this condition includes vitamin B12 replacement for life. This information is not intended to replace advice given to you by your health care provider. Make sure you discuss any questions you have with your health care provider. Document Revised: 09/13/2018 Document Reviewed: 07/15/2018 Elsevier Patient Education  2020 [employer] Inc.  

## 2019-12-22 NOTE — Progress Notes (Signed)
Subjective:    Patient ID: Angela Michael, female    DOB: 1961-03-02, 59 y.o.   MRN: [id number]   Chief Complaint: Medical Management of Chronic Issues   HPI Pt presents to office fir refill on Vitamin B-12 injection refills, last injection ~March 2020. Pt has no other complaints.    Review of Systems  Constitutional: Negative.   HENT: Negative.   Eyes: Negative.   Respiratory: Negative.   Cardiovascular: Negative.   Gastrointestinal: Negative.   Endocrine: Negative.   Genitourinary: Negative.   Musculoskeletal: Negative.   Skin: Negative.   Allergic/Immunologic: Negative.   Neurological: Negative.   Hematological: Negative.   Psychiatric/Behavioral: Negative.   All other systems reviewed and are negative.      Objective:   Physical Exam Vitals and nursing note reviewed.  Constitutional:      Appearance: Normal appearance.  HENT:     Head: Normocephalic and atraumatic.     Right Ear: External ear normal.     Left Ear: External ear normal.     Nose: Nose normal.     Mouth/Throat:     Mouth: Mucous membranes are moist.     Pharynx: Oropharynx is clear.  Eyes:     Extraocular Movements: Extraocular movements intact.     Conjunctiva/sclera: Conjunctivae normal.     Pupils: Pupils are equal, round, and reactive to light.  Cardiovascular:     Rate and Rhythm: Normal rate and regular rhythm.     Pulses: Normal pulses.     Heart sounds: Normal heart sounds.  Pulmonary:     Effort: Pulmonary effort is normal.  Abdominal:     General: Bowel sounds are normal.     Palpations: Abdomen is soft.  Musculoskeletal:        General: Normal range of motion.     Cervical back: Normal range of motion.  Skin:    General: Skin is warm and dry.     Capillary Refill: Capillary refill takes less than 2 seconds.  Neurological:     General: No focal deficit present.     Mental Status: She is alert and oriented to person, place, and time. Mental status is at baseline.    Psychiatric:        Mood and Affect: Mood normal.        Behavior: Behavior normal.        Thought Content: Thought content normal.        Judgment: Judgment normal.    BP 119/73    Pulse (!) 51    Temp 97.8 F (36.6 C) (Temporal)    Resp 20    Ht 5\' 1"  (1.549 m)    Wt 203 lb (92.1 kg)    SpO2 94%    BMI 38.36 kg/m      Assessment & Plan:   Angela Michael in today with chief complaint of Medical Management of Chronic Issues   1. Pernicious anemia Meds ordered this encounter  Medications   cyanocobalamin (,VITAMIN B-12,) 1000 MCG/ML injection    Sig: Inject 1 mL (1,000 mcg total) into the muscle every 30 (thirty) days.    Dispense:  30 mL    Refill:  1    Order Specific Question:   Supervising Provider    Answer:   Caryl Pina A [[id number]]   Back on monthly injections     The above assessment and management plan was discussed with the patient. The patient verbalized understanding of and has agreed to the management  plan. Patient is aware to call the clinic if symptoms persist or worsen. Patient is aware when to return to the clinic for a follow-up visit. Patient educated on when it is appropriate to go to the emergency department.   Angela Hassell Done, FNP

## 2019-12-29 IMAGING — CT CT HEAR MORPH WITH CTA COR WITH SCORE WITH CA WITH CONTRAST AND
4 of 7 series · 8 of 20 positions shown, 9 images · IV contrast (APPLIED)
Comparison: CT chest dated 08/01/2014
COMPARISON: CT chest dated 08/01/2014

Addendum:
EXAM:
OVER-READ INTERPRETATION  CT CHEST

The following report is an over-read performed by radiologist Dr.
Yichang Isidoro [REDACTED] on 10/06/2018. This
over-read does not include interpretation of cardiac or coronary
anatomy or pathology. The coronary CTA interpretation by the
cardiologist is attached.
CLINICAL DATA: Chest pain
Cardiac CTA
MEDICATIONS:
Sub lingual nitro. 4mg x 2
TECHNIQUE: The patient was scanned on a Siemens [REDACTED]ice scanner. Gantry
rotation speed was 250 msecs. Collimation was 0.6 mm. A 100 kV
prospective scan was triggered in the ascending thoracic aorta at
35-75% of the R-R interval. Average HR during the scan was 60 bpm.
The 3D data set was interpreted on a dedicated work station using
MPR, MIP and VRT modes. A total of 80cc of contrast was used.

[Series 6: best diast 79 % · axial · 0.39mm/px · z∈[+1085,+1128]mm · 2 of 323 slices shown, 3 images]
[im 108/323  vessel]
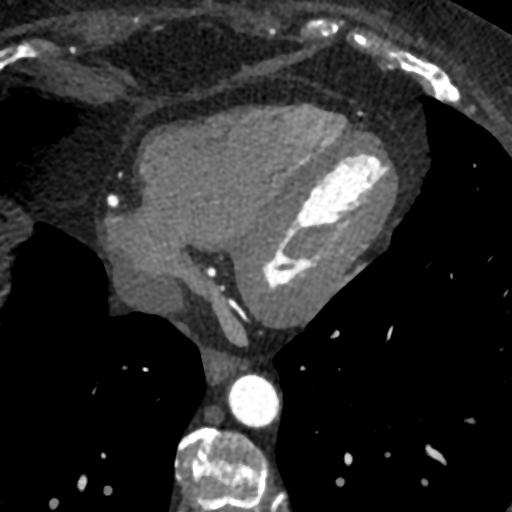
[im 108/323  lung]
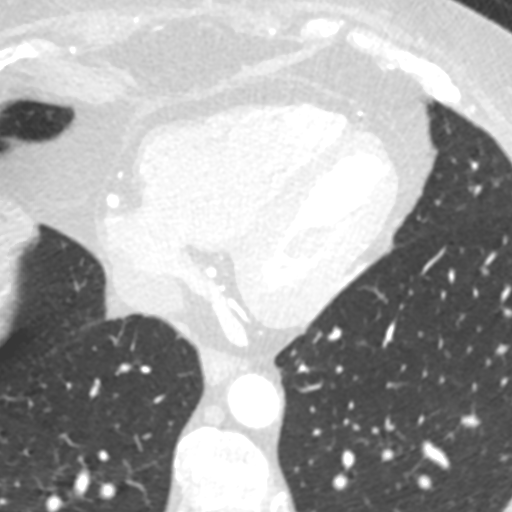
[im 215/323  vessel]
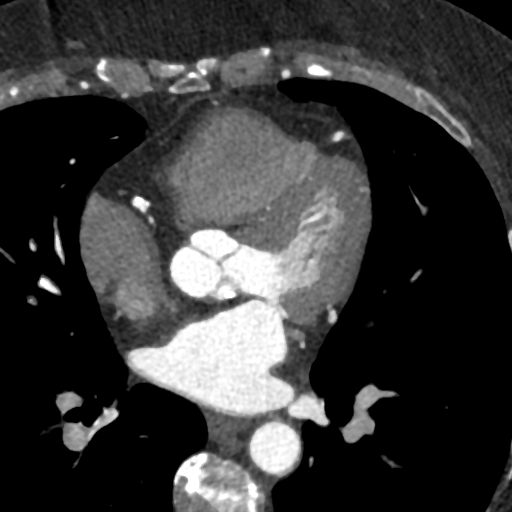

[Series 7: best syst 35 % · axial · 0.39mm/px · z∈[+1085,+1128]mm · 2 of 323 slices shown]
[im 108/323  vessel]
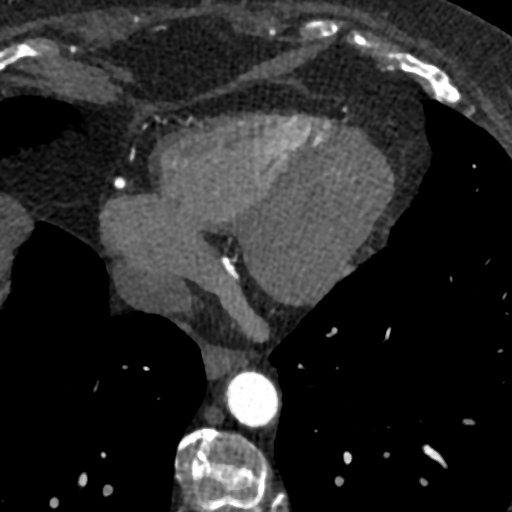
[im 215/323  vessel]
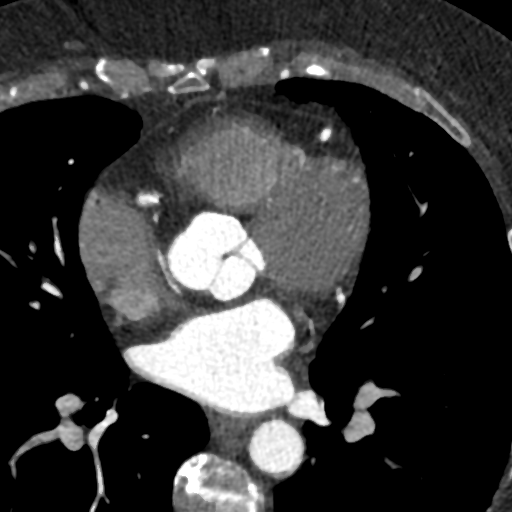

[Series 8: ts diast sharp 79 % · axial · 0.39mm/px · z∈[+1085,+1128]mm · 2 of 323 slices shown]
[im 108/323  lung]
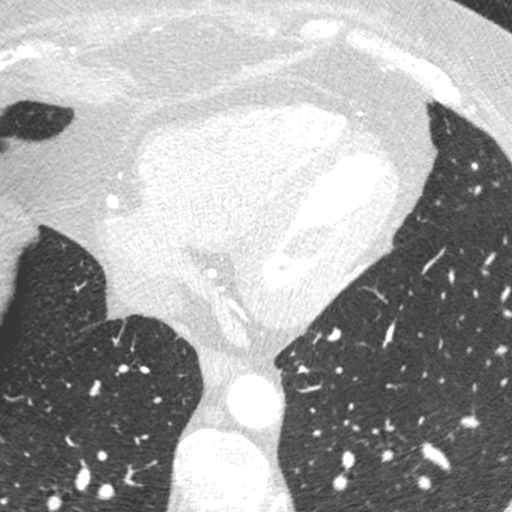
[im 215/323  lung]
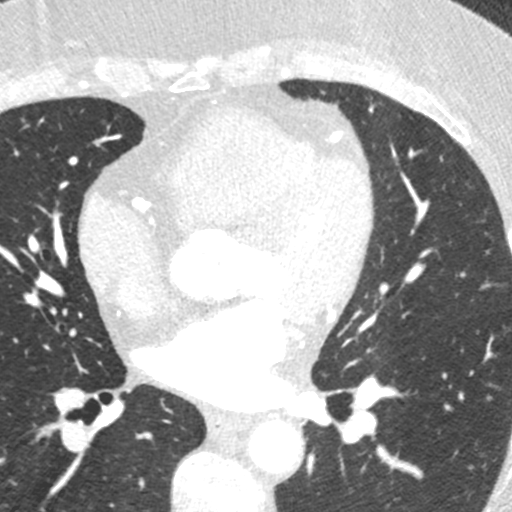

[Series 9: ts syst sharp 35 % · axial · 0.39mm/px · z∈[+1085,+1128]mm · 2 of 323 slices shown]
[im 108/323  lung]
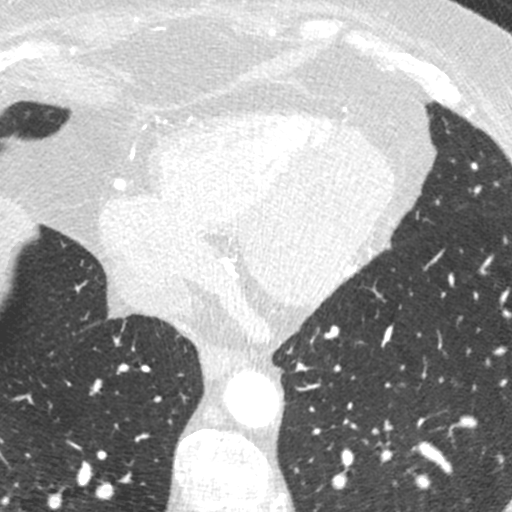
[im 215/323  lung]
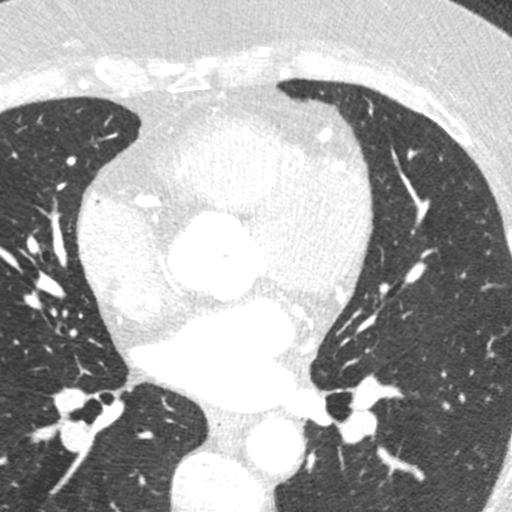

[8 of 20 positions shown; findings below may reference images not displayed]

FINDINGS: Vascular: Mild atherosclerotic calcifications of the aortic arch.
Coronary atherosclerosis of the LAD and right coronary artery.
Enlargement of the main pulmonary artery, raising the possibility of
pulmonary arterial hypertension. Please refer to dedicated CTA chest
report for additional vascular findings.

Mediastinum/Nodes: No suspicious mediastinal lymphadenopathy.

Lungs/Pleura: Visualized lungs are essentially clear. No suspicious
pulmonary nodules. No pleural effusions or visualized pneumothorax.

Upper Abdomen: Visualized upper abdomen is notable for a tiny hiatal
hernia and suspected postsurgical changes related to prior gastric
surgery.

Musculoskeletal: Degenerative changes of the thoracic spine.
IMPRESSION: No significant extracardiac findings.
FINDINGS: Non-cardiac: See separate report from [REDACTED].

Pulmonary veins drain normally to the left atrium.

Calcium Score: 0240 Agatston units.

Coronary Arteries: Right dominant with no anomalies

LM: Short, no significant disease.

LAD system: Primarily calcified plaque in the proximal and mid LAD.
Stenosis in the proximal and mid LAD appears to be around 50%.

Circumflex system: Mixed plaque proximal LCx, mild (<50%) stenosis.
Large OM1 with mixed plaque, mild (<50%) stenosis. Small AV LCx
after take-off of OM1.

RCA system: Mixed plaque through the proximal and mid RCA. There is
a focal area of the proximal RCA with possible severe (70-90%)
stenosis.
IMPRESSION: 1. Coronary artery calcium score 0240 Agatston units. This places
the patient in the 99th percentile for age and gender, suggesting
high risk for future cardiac events.

2.  Possible severe proximal RCA stenosis.

3.  Approximately 50% proximal and mid LAD stenosis.

Will send for FFR to confirm.

Laaouina Tiger

*** End of Addendum ***
EXAM:
OVER-READ INTERPRETATION  CT CHEST

The following report is an over-read performed by radiologist Dr.
Yichang Isidoro [REDACTED] on 10/06/2018. This
over-read does not include interpretation of cardiac or coronary
anatomy or pathology. The coronary CTA interpretation by the
cardiologist is attached.
FINDINGS: Vascular: Mild atherosclerotic calcifications of the aortic arch.
Coronary atherosclerosis of the LAD and right coronary artery.
Enlargement of the main pulmonary artery, raising the possibility of
pulmonary arterial hypertension. Please refer to dedicated CTA chest
report for additional vascular findings.

Mediastinum/Nodes: No suspicious mediastinal lymphadenopathy.

Lungs/Pleura: Visualized lungs are essentially clear. No suspicious
pulmonary nodules. No pleural effusions or visualized pneumothorax.

Upper Abdomen: Visualized upper abdomen is notable for a tiny hiatal
hernia and suspected postsurgical changes related to prior gastric
surgery.

Musculoskeletal: Degenerative changes of the thoracic spine.
IMPRESSION: No significant extracardiac findings.

## 2020-01-02 LAB — HM MAMMOGRAPHY: HM Mammogram: NORMAL (ref 0–4)

## 2020-01-26 ENCOUNTER — Other Ambulatory Visit: Payer: Self-pay | Admitting: Nurse Practitioner

## 2020-01-26 DIAGNOSIS — R609 Edema, unspecified: Secondary | ICD-10-CM

## 2020-02-07 ENCOUNTER — Ambulatory Visit (INDEPENDENT_AMBULATORY_CARE_PROVIDER_SITE_OTHER): Payer: Medicaid Other | Admitting: Nurse Practitioner

## 2020-02-07 ENCOUNTER — Encounter: Payer: Self-pay | Admitting: Nurse Practitioner

## 2020-02-07 ENCOUNTER — Other Ambulatory Visit: Payer: Self-pay

## 2020-02-07 VITALS — BP 132/83 | HR 51 | Temp 97.9°F | Resp 20 | Ht 61.0 in | Wt 203.0 lb

## 2020-02-07 DIAGNOSIS — E785 Hyperlipidemia, unspecified: Secondary | ICD-10-CM

## 2020-02-07 DIAGNOSIS — I471 Supraventricular tachycardia: Secondary | ICD-10-CM

## 2020-02-07 DIAGNOSIS — Z23 Encounter for immunization: Secondary | ICD-10-CM

## 2020-02-07 DIAGNOSIS — G5691 Unspecified mononeuropathy of right upper limb: Secondary | ICD-10-CM

## 2020-02-07 DIAGNOSIS — G4700 Insomnia, unspecified: Secondary | ICD-10-CM

## 2020-02-07 DIAGNOSIS — R609 Edema, unspecified: Secondary | ICD-10-CM

## 2020-02-07 DIAGNOSIS — R569 Unspecified convulsions: Secondary | ICD-10-CM

## 2020-02-07 DIAGNOSIS — F3341 Major depressive disorder, recurrent, in partial remission: Secondary | ICD-10-CM

## 2020-02-07 DIAGNOSIS — F5101 Primary insomnia: Secondary | ICD-10-CM

## 2020-02-07 MED ORDER — LEVETIRACETAM 500 MG PO TABS: ORAL_TABLET | ORAL | 5 refills | Status: DC

## 2020-02-07 MED ORDER — TRAZODONE HCL 50 MG PO TABS: ORAL_TABLET | ORAL | 1 refills | Status: DC

## 2020-02-07 MED ORDER — FUROSEMIDE 40 MG PO TABS: ORAL_TABLET | ORAL | 0 refills | Status: DC

## 2020-02-07 MED ORDER — ISOSORBIDE DINITRATE 10 MG PO TABS: 10.0000 mg | ORAL_TABLET | Freq: Two times a day (BID) | ORAL | 6 refills | Status: DC

## 2020-02-07 MED ORDER — GABAPENTIN 300 MG PO CAPS: ORAL_CAPSULE | ORAL | 5 refills | Status: DC

## 2020-02-07 MED ORDER — ATORVASTATIN CALCIUM 40 MG PO TABS: 40.0000 mg | ORAL_TABLET | Freq: Every day | ORAL | 5 refills | Status: DC

## 2020-02-07 MED ORDER — DULOXETINE HCL 60 MG PO CPEP: 60.0000 mg | ORAL_CAPSULE | Freq: Every day | ORAL | 1 refills | Status: DC

## 2020-02-07 NOTE — Patient Instructions (Signed)
DASH Eating Plan DASH stands for "Dietary Approaches to Stop Hypertension." The DASH eating plan is a healthy eating plan that has been shown to reduce high blood pressure (hypertension). It may also reduce your risk for type 2 diabetes, heart disease, and stroke. The DASH eating plan may also help with weight loss. What are tips for following this plan?  General guidelines  Avoid eating more than 2,300 mg (milligrams) of salt (sodium) a day. If you have hypertension, you may need to reduce your sodium intake to 1,500 mg a day.  Limit alcohol intake to no more than 1 drink a day for nonpregnant women and 2 drinks a day for men. One drink equals 12 oz of beer, 5 oz of wine, or 1 oz of hard liquor.  Work with your health care provider to maintain a healthy body weight or to lose weight. Ask what an ideal weight is for you.  Get at least 30 minutes of exercise that causes your heart to beat faster (aerobic exercise) most days of the week. Activities may include walking, swimming, or biking.  Work with your health care provider or diet and nutrition specialist (dietitian) to adjust your eating plan to your individual calorie needs. Reading food labels   Check food labels for the amount of sodium per serving. Choose foods with less than 5 percent of the Daily Value of sodium. Generally, foods with less than 300 mg of sodium per serving fit into this eating plan.  To find whole grains, look for the word "whole" as the first word in the ingredient list. Shopping  Buy products labeled as "low-sodium" or "no salt added."  Buy fresh foods. Avoid canned foods and premade or frozen meals. Cooking  Avoid adding salt when cooking. Use salt-free seasonings or herbs instead of table salt or sea salt. Check with your health care provider or pharmacist before using salt substitutes.  Do not fry foods. Cook foods using healthy methods such as baking, boiling, grilling, and broiling instead.  Cook with  heart-healthy oils, such as olive, canola, soybean, or sunflower oil. Meal planning  Eat a balanced diet that includes: ? 5 or more servings of fruits and vegetables each day. At each meal, try to fill half of your plate with fruits and vegetables. ? Up to 6-8 servings of whole grains each day. ? Less than 6 oz of lean meat, poultry, or fish each day. A 3-oz serving of meat is about the same size as a deck of cards. One egg equals 1 oz. ? 2 servings of low-fat dairy each day. ? A serving of nuts, seeds, or beans 5 times each week. ? Heart-healthy fats. Healthy fats called Omega-3 fatty acids are found in foods such as flaxseeds and coldwater fish, like sardines, salmon, and mackerel.  Limit how much you eat of the following: ? Canned or prepackaged foods. ? Food that is high in trans fat, such as fried foods. ? Food that is high in saturated fat, such as fatty meat. ? Sweets, desserts, sugary drinks, and other foods with added sugar. ? Full-fat dairy products.  Do not salt foods before eating.  Try to eat at least 2 vegetarian meals each week.  Eat more home-cooked food and less restaurant, buffet, and fast food.  When eating at a restaurant, ask that your food be prepared with less salt or no salt, if possible. What foods are recommended? The items listed may not be a complete list. Talk with your dietitian about   what dietary choices are best for you. Grains Whole-grain or whole-wheat bread. Whole-grain or whole-wheat pasta. Brown rice. Oatmeal. Quinoa. Bulgur. Whole-grain and low-sodium cereals. Pita bread. Low-fat, low-sodium crackers. Whole-wheat flour tortillas. Vegetables Fresh or frozen vegetables (raw, steamed, roasted, or grilled). Low-sodium or reduced-sodium tomato and vegetable juice. Low-sodium or reduced-sodium tomato sauce and tomato paste. Low-sodium or reduced-sodium canned vegetables. Fruits All fresh, dried, or frozen fruit. Canned fruit in natural juice (without  added sugar). Meat and other protein foods Skinless chicken or turkey. Ground chicken or turkey. Pork with fat trimmed off. Fish and seafood. Egg whites. Dried beans, peas, or lentils. Unsalted nuts, nut butters, and seeds. Unsalted canned beans. Lean cuts of beef with fat trimmed off. Low-sodium, lean deli meat. Dairy Low-fat (1%) or fat-free (skim) milk. Fat-free, low-fat, or reduced-fat cheeses. Nonfat, low-sodium ricotta or cottage cheese. Low-fat or nonfat yogurt. Low-fat, low-sodium cheese. Fats and oils Soft margarine without trans fats. Vegetable oil. Low-fat, reduced-fat, or light mayonnaise and salad dressings (reduced-sodium). Canola, safflower, olive, soybean, and sunflower oils. Avocado. Seasoning and other foods Herbs. Spices. Seasoning mixes without salt. Unsalted popcorn and pretzels. Fat-free sweets. What foods are not recommended? The items listed may not be a complete list. Talk with your dietitian about what dietary choices are best for you. Grains Baked goods made with fat, such as croissants, muffins, or some breads. Dry pasta or rice meal packs. Vegetables Creamed or fried vegetables. Vegetables in a cheese sauce. Regular canned vegetables (not low-sodium or reduced-sodium). Regular canned tomato sauce and paste (not low-sodium or reduced-sodium). Regular tomato and vegetable juice (not low-sodium or reduced-sodium). Pickles. Olives. Fruits Canned fruit in a light or heavy syrup. Fried fruit. Fruit in cream or butter sauce. Meat and other protein foods Fatty cuts of meat. Ribs. Fried meat. Bacon. Sausage. Bologna and other processed lunch meats. Salami. Fatback. Hotdogs. Bratwurst. Salted nuts and seeds. Canned beans with added salt. Canned or smoked fish. Whole eggs or egg yolks. Chicken or turkey with skin. Dairy Whole or 2% milk, cream, and half-and-half. Whole or full-fat cream cheese. Whole-fat or sweetened yogurt. Full-fat cheese. Nondairy creamers. Whipped toppings.  Processed cheese and cheese spreads. Fats and oils Butter. Stick margarine. Lard. Shortening. Ghee. Bacon fat. Tropical oils, such as coconut, palm kernel, or palm oil. Seasoning and other foods Salted popcorn and pretzels. Onion salt, garlic salt, seasoned salt, table salt, and sea salt. Worcestershire sauce. Tartar sauce. Barbecue sauce. Teriyaki sauce. Soy sauce, including reduced-sodium. Steak sauce. Canned and packaged gravies. Fish sauce. Oyster sauce. Cocktail sauce. Horseradish that you find on the shelf. Ketchup. Mustard. Meat flavorings and tenderizers. Bouillon cubes. Hot sauce and Tabasco sauce. Premade or packaged marinades. Premade or packaged taco seasonings. Relishes. Regular salad dressings. Where to find more information:  National Heart, Lung, and Blood Institute: www.nhlbi.nih.gov  American Heart Association: www.heart.org Summary  The DASH eating plan is a healthy eating plan that has been shown to reduce high blood pressure (hypertension). It may also reduce your risk for type 2 diabetes, heart disease, and stroke.  With the DASH eating plan, you should limit salt (sodium) intake to 2,300 mg a day. If you have hypertension, you may need to reduce your sodium intake to 1,500 mg a day.  When on the DASH eating plan, aim to eat more fresh fruits and vegetables, whole grains, lean proteins, low-fat dairy, and heart-healthy fats.  Work with your health care provider or diet and nutrition specialist (dietitian) to adjust your eating plan to your   individual calorie needs. This information is not intended to replace advice given to you by your health care provider. Make sure you discuss any questions you have with your health care provider. Document Revised: 03/13/2017 Document Reviewed: 03/24/2016 Elsevier Patient Education  2020 [employer].  

## 2020-02-07 NOTE — Progress Notes (Signed)
Subjective:    Patient ID: Angela Michael, female    DOB: 18-Oct-1960, 59 y.o.   MRN: [id number]    Chief Complaint: Chronic Follow-up   HPI:  1. SVT (supraventricular tachycardia) ([place]) Denies rapid heart rate, fluttering or pounding, chest pain, or SOB.   2. Hyperlipidemia with target LDL less than 100 Lab Results  Component Value Date   CHOL 149 01/18/2019   HDL 58 01/18/2019   LDLCALC 72 01/18/2019   TRIG 106 01/18/2019   CHOLHDL 2.6 01/18/2019  Takes medication as prescribed. Avoids fried and fatty foods.    3. Morbid obesity ([place]) Wt Readings from Last 3 Encounters:  02/07/20 203 lb (92.1 kg)  12/22/19 203 lb (92.1 kg)  05/20/19 191 lb (86.6 kg)   BMI Readings from Last 3 Encounters:  02/07/20 38.36 kg/m  12/22/19 38.36 kg/m  05/20/19 36.09 kg/m  Denies any exercise, states its unsafe to walk on her road and does not have anywhere to go swimming.     4. Insomnia, unspecified type States she gets 4-5 hours of sleep per night. Unable to stay asleep.  5. Recurrent major depressive disorder, in partial remission ([place])   Office Visit from 12/22/2019 in [facility]  PHQ-9 Total Score 2     GAD 7 : Generalized Anxiety Score 02/07/2020 01/18/2019  Nervous, Anxious, on Edge 3 0  Control/stop worrying 3 1  Worry too much - different things 3 1  Trouble relaxing 3 1  Restless 0 0  Easily annoyed or irritable 1 0  Afraid - awful might happen 0 0  Total GAD 7 Score 13 3  Anxiety Difficulty Not difficult at all Not difficult at all  Takes medication as prescribed. Denies any new or worsening symptoms.       Outpatient Encounter Medications as of 02/07/2020  Medication Sig  . acetaminophen (TYLENOL) 500 MG tablet Take 1,000 mg every 6 (six) hours as needed by mouth for moderate pain or headache.  Marland Kitchen aspirin 81 MG tablet Take 81 mg by mouth daily. Reported on 06/21/2015  . atorvastatin (LIPITOR) 40 MG tablet Take 1 tablet (40 mg total) by  mouth at bedtime.  . cyanocobalamin (,VITAMIN B-12,) 1000 MCG/ML injection Inject 1 mL (1,000 mcg total) into the muscle every 30 (thirty) days.  . cyclobenzaprine (FLEXERIL) 5 MG tablet Take 1 tablet (5 mg total) by mouth at bedtime.  . DULoxetine (CYMBALTA) 60 MG capsule Take by mouth.  . fluticasone (FLONASE) 50 MCG/ACT nasal spray PLACE 2 SPRAYS INTO THE NOSE AS NEEDED. FOR ALLERGIES  . furosemide (LASIX) 40 MG tablet TAKE 1 TABLET BY MOUTH EVERY DAY  . gabapentin (NEURONTIN) 300 MG capsule TAKE 2 CAPSULE BY MOUTH TID  . isosorbide dinitrate (ISORDIL) 10 MG tablet Take 1 tablet (10 mg total) by mouth 2 (two) times daily.  Marland Kitchen levETIRAcetam (KEPPRA) 500 MG tablet TAKE 2 TABLETS BY MOUTH 2 TIMES DAILY  . loratadine (CLARITIN) 10 MG tablet Take 10 mg daily as needed by mouth for allergies.   . Multiple Vitamin (MULTIVITAMIN WITH MINERALS) TABS Take 1 tablet by mouth daily. Reported on 06/21/2015  . nitroGLYCERIN (NITROSTAT) 0.4 MG SL tablet Place 1 tablet (0.4 mg total) under the tongue every 5 (five) minutes as needed for chest pain.  . polyethylene glycol powder (GLYCOLAX/MIRALAX) powder Take 17 g by mouth daily as needed. (Patient taking differently: Take 17 g daily as needed by mouth for moderate constipation. )  . traZODone (DESYREL) 50 MG tablet  TAKE 1 TO 3 TABLETS BY MOUTH AT BEDTIME   No facility-administered encounter medications on file as of 02/07/2020.    Past Surgical History:  Procedure Laterality Date  . ABDOMINOPLASTY N/A 03/02/2017   Procedure: BACK LIPECTOMY;  Surgeon: Irene Limbo, MD;  Location: [facility];  Service: Plastics;  Laterality: N/A;  . ABDOMINOPLASTY/PANNICULECTOMY N/A 03/02/2017   Procedure: ABDOMINOPLASTY/PANNICULECTOMY;  Surgeon: Irene Limbo, MD;  Location: [facility];  Service: Plastics;  Laterality: N/A;  . BREAST SURGERY     " uplift"  . CATARACT EXTRACTION Right   . CATARACT EXTRACTION W/ INTRAOCULAR LENS  IMPLANT, BILATERAL    . CHOLECYSTECTOMY    .  COLONOSCOPY    . ENDOVENOUS ABLATION SAPHENOUS VEIN W/ LASER Right 09-08-2013   EVLA right small saphenous vein and stab phlebectomy 10-20 incisions right leg by Curt Jews MD  . GASTRIC BYPASS  2007   Says she had lost 345 lbs  . LYMPH NODE DISSECTION Left    Resected  . lymph node removed from stomach    . MEDIAL PARTIAL KNEE REPLACEMENT  05/10/14   left knee  . MEDIAL PARTIAL KNEE REPLACEMENT Left 06/04/2015   [facility]  . SKIN SURGERY     Removal of excess skin  . TOOTH EXTRACTION Right 02/20/2017  . VENA CAVA FILTER PLACEMENT    . VENOUS THROMBECTOMY     Blood clot resection from right arm    Family History  Problem Relation Age of Onset  . Liver cancer Father   . Diabetes Father   . Kidney cancer Father   . Heart failure Brother   . Heart attack Brother   . Tuberculosis Maternal Grandmother   . Bone cancer Maternal Grandfather   . Diabetes Paternal Uncle   . Lung cancer Mother   . Coronary artery disease Neg Hx   . Sudden death Neg Hx   . Cardiomyopathy Neg Hx   . Esophageal cancer Neg Hx   . Stomach cancer Neg Hx     New complaints: No new complaints today.   Social history: Lives at home with husband, daughter and family. Is caring for grandchild a lot.   Controlled substance contract: n/a    Review of Systems  Constitutional: Negative.   HENT: Negative.   Eyes: Negative.   Respiratory: Negative.   Cardiovascular: Negative.   Gastrointestinal: Negative.   Endocrine: Negative.   Genitourinary: Negative.   Musculoskeletal: Negative.   Skin: Negative.   Allergic/Immunologic: Negative.   Neurological: Negative.   Hematological: Negative.   Psychiatric/Behavioral: Negative.   All other systems reviewed and are negative.      Objective:   Physical Exam Vitals and nursing note reviewed.  Constitutional:      Appearance: Normal appearance.  HENT:     Head: Normocephalic and atraumatic.     Right Ear: Tympanic membrane, ear canal and external ear  normal.     Left Ear: Tympanic membrane, ear canal and external ear normal.     Nose: Nose normal.     Mouth/Throat:     Mouth: Mucous membranes are moist.     Pharynx: Oropharynx is clear.  Eyes:     Extraocular Movements: Extraocular movements intact.     Conjunctiva/sclera: Conjunctivae normal.     Pupils: Pupils are equal, round, and reactive to light.  Cardiovascular:     Rate and Rhythm: Regular rhythm. Bradycardia present.     Pulses: Normal pulses.     Heart sounds: Normal heart sounds.  Pulmonary:  Effort: Pulmonary effort is normal.     Breath sounds: Normal breath sounds.  Abdominal:     General: Abdomen is flat. Bowel sounds are normal.     Palpations: Abdomen is soft.  Musculoskeletal:        General: Normal range of motion.     Cervical back: Normal range of motion.  Skin:    General: Skin is warm and dry.     Capillary Refill: Capillary refill takes less than 2 seconds.  Neurological:     General: No focal deficit present.     Mental Status: She is alert and oriented to person, place, and time. Mental status is at baseline.  Psychiatric:        Mood and Affect: Mood is depressed.        Behavior: Behavior normal. Behavior is cooperative.        Thought Content: Thought content normal.        Cognition and Memory: Cognition normal.        Judgment: Judgment normal.   BP 132/83   Pulse (!) 51   Temp 97.9 F (36.6 C) (Temporal)   Resp 20   Ht 5\' 1"  (1.549 m)   Wt 203 lb (92.1 kg)   SpO2 95%   BMI 38.36 kg/m        Assessment & Plan:  Angela Michael comes in today with chief complaint of No chief complaint on file.   Diagnosis and orders addressed:  1. SVT (supraventricular tachycardia) ([place]) Report any new or worsening symptoms. Take all medication as prescribed.   2. Hyperlipidemia with target LDL less than 100 Take medication as prescribed. Avoid foods that are fried or fatty.   3. Morbid obesity ([place]) Exercise regularly. Walking and  swimming are great low-impact exercises that help keep the heart healthy, lower cholesterol levels, and will help with weight management.   4. Insomnia, unspecified type Take medication as prescribed. Avoid caffeine or other stimulants close to bedtime. Go to bed and wake up the same time each day for good sleep hygiene.    5. Recurrent major depressive disorder, in partial remission ([place]) Take medication as prescribed. Practice stress management. Report any new or worsening symptoms.   Meds ordered this encounter  Medications  . atorvastatin (LIPITOR) 40 MG tablet    Sig: Take 1 tablet (40 mg total) by mouth at bedtime.    Dispense:  30 tablet    Refill:  5    Order Specific Question:   Supervising Provider    Answer:   Caryl Pina A [id number]  . gabapentin (NEURONTIN) 300 MG capsule    Sig: TAKE 2 CAPSULE BY MOUTH TID    Dispense:  180 capsule    Refill:  5    Order Specific Question:   Supervising Provider    Answer:   Caryl Pina A [id number]  . furosemide (LASIX) 40 MG tablet    Sig: TAKE 1 TABLET BY MOUTH EVERY DAY    Dispense:  90 tablet    Refill:  0    Order Specific Question:   Supervising Provider    Answer:   Caryl Pina A [id number]  . traZODone (DESYREL) 50 MG tablet    Sig: TAKE 1 TO 3 TABLETS BY MOUTH AT BEDTIME    Dispense:  270 tablet    Refill:  1    DX Code Needed  .    Order Specific Question:   Supervising Provider    Answer:  DETTINGER, JOSHUA A [[id number]]  . levETIRAcetam (KEPPRA) 500 MG tablet    Sig: TAKE 2 TABLETS BY MOUTH 2 TIMES DAILY    Dispense:  120 tablet    Refill:  5    Order Specific Question:   Supervising Provider    Answer:   Caryl Pina A [id number]  . isosorbide dinitrate (ISORDIL) 10 MG tablet    Sig: Take 1 tablet (10 mg total) by mouth 2 (two) times daily.    Dispense:  60 tablet    Refill:  6    New 07/28/2018    Order Specific Question:   Supervising Provider    Answer:   Caryl Pina A [[id number]]  .  DULoxetine (CYMBALTA) 60 MG capsule    Sig: Take 1 capsule (60 mg total) by mouth daily.    Dispense:  90 capsule    Refill:  1    Order Specific Question:   Supervising Provider    Answer:   Caryl Pina A [id number]     Labs pending Health Maintenance reviewed Diet and exercise encouraged  Follow up plan: Follow up in 3 months.    Angela Hassell Done, FNP

## 2020-02-08 LAB — CMP14+EGFR
ALT: 25 IU/L (ref 0–32)
AST: 26 IU/L (ref 0–40)
Albumin/Globulin Ratio: 2 (ref 1.2–2.2)
Albumin: 4 g/dL (ref 3.8–4.9)
Alkaline Phosphatase: 158 IU/L — ABNORMAL HIGH (ref 44–121)
BUN/Creatinine Ratio: 19 (ref 9–23)
BUN: 15 mg/dL (ref 6–24)
Bilirubin Total: 0.6 mg/dL (ref 0.0–1.2)
CO2: 25 mmol/L (ref 20–29)
Calcium: 9 mg/dL (ref 8.7–10.2)
Chloride: 105 mmol/L (ref 96–106)
Creatinine, Ser: 0.8 mg/dL (ref 0.57–1.00)
GFR calc Af Amer: 95 mL/min/{1.73_m2} (ref >59)
GFR calc non Af Amer: 82 mL/min/{1.73_m2} (ref >59)
Globulin, Total: 2 g/dL (ref 1.5–4.5)
Glucose: 90 mg/dL (ref 65–99)
Potassium: 4.1 mmol/L (ref 3.5–5.2)
Sodium: 142 mmol/L (ref 134–144)
Total Protein: 6 g/dL (ref 6.0–8.5)

## 2020-02-08 LAB — CBC WITH DIFFERENTIAL/PLATELET
Basophils Absolute: 0 10*3/uL (ref 0.0–0.2)
Basos: 0 %
EOS (ABSOLUTE): 0.1 10*3/uL (ref 0.0–0.4)
Eos: 3 %
Hematocrit: 40.9 % (ref 34.0–46.6)
Hemoglobin: 13.3 g/dL (ref 11.1–15.9)
Immature Grans (Abs): 0 10*3/uL (ref 0.0–0.1)
Immature Granulocytes: 0 %
Lymphocytes Absolute: 1.2 10*3/uL (ref 0.7–3.1)
Lymphs: 22 %
MCH: 32.1 pg (ref 26.6–33.0)
MCHC: 32.5 g/dL (ref 31.5–35.7)
MCV: 99 fL — ABNORMAL HIGH (ref 79–97)
Monocytes Absolute: 0.3 10*3/uL (ref 0.1–0.9)
Monocytes: 6 %
Neutrophils Absolute: 3.6 10*3/uL (ref 1.4–7.0)
Neutrophils: 69 %
Platelets: 137 10*3/uL — ABNORMAL LOW (ref 150–450)
RBC: 4.14 x10E6/uL (ref 3.77–5.28)
RDW: 11.4 % — ABNORMAL LOW (ref 11.7–15.4)
WBC: 5.2 10*3/uL (ref 3.4–10.8)

## 2020-02-08 LAB — LIPID PANEL
Chol/HDL Ratio: 2.9 ratio (ref 0.0–4.4)
Cholesterol, Total: 141 mg/dL (ref 100–199)
HDL: 48 mg/dL (ref >39)
LDL Chol Calc (NIH): 74 mg/dL (ref 0–99)
Triglycerides: 105 mg/dL (ref 0–149)
VLDL Cholesterol Cal: 19 mg/dL (ref 5–40)

## 2020-02-08 LAB — TSH: TSH: 1.43 u[IU]/mL (ref 0.450–4.500)

## 2020-02-16 ENCOUNTER — Other Ambulatory Visit: Payer: Self-pay | Admitting: Nurse Practitioner

## 2020-02-16 DIAGNOSIS — G5691 Unspecified mononeuropathy of right upper limb: Secondary | ICD-10-CM

## 2020-04-25 ENCOUNTER — Other Ambulatory Visit: Payer: Self-pay | Admitting: Nurse Practitioner

## 2020-04-25 DIAGNOSIS — F3341 Major depressive disorder, recurrent, in partial remission: Secondary | ICD-10-CM

## 2020-05-10 ENCOUNTER — Ambulatory Visit: Payer: Self-pay | Admitting: Nurse Practitioner

## 2020-05-14 ENCOUNTER — Other Ambulatory Visit: Payer: Self-pay

## 2020-05-14 ENCOUNTER — Ambulatory Visit (INDEPENDENT_AMBULATORY_CARE_PROVIDER_SITE_OTHER): Payer: Medicaid Other | Admitting: Nurse Practitioner

## 2020-05-14 ENCOUNTER — Encounter: Payer: Self-pay | Admitting: Nurse Practitioner

## 2020-05-14 VITALS — BP 129/69 | HR 72 | Temp 97.6°F | Resp 20 | Ht 61.0 in | Wt 209.0 lb

## 2020-05-14 DIAGNOSIS — F5101 Primary insomnia: Secondary | ICD-10-CM

## 2020-05-14 DIAGNOSIS — F3341 Major depressive disorder, recurrent, in partial remission: Secondary | ICD-10-CM | POA: Diagnosis not present

## 2020-05-14 DIAGNOSIS — E876 Hypokalemia: Secondary | ICD-10-CM

## 2020-05-14 DIAGNOSIS — C8590 Non-Hodgkin lymphoma, unspecified, unspecified site: Secondary | ICD-10-CM

## 2020-05-14 DIAGNOSIS — R609 Edema, unspecified: Secondary | ICD-10-CM

## 2020-05-14 DIAGNOSIS — G4700 Insomnia, unspecified: Secondary | ICD-10-CM

## 2020-05-14 DIAGNOSIS — D61818 Other pancytopenia: Secondary | ICD-10-CM

## 2020-05-14 DIAGNOSIS — E785 Hyperlipidemia, unspecified: Secondary | ICD-10-CM | POA: Diagnosis not present

## 2020-05-14 DIAGNOSIS — R569 Unspecified convulsions: Secondary | ICD-10-CM

## 2020-05-14 DIAGNOSIS — I471 Supraventricular tachycardia: Secondary | ICD-10-CM

## 2020-05-14 LAB — CBC WITH DIFFERENTIAL/PLATELET
Basophils Absolute: 0 10*3/uL (ref 0.0–0.2)
Basos: 1 %
EOS (ABSOLUTE): 0.2 10*3/uL (ref 0.0–0.4)
Eos: 3 %
Hematocrit: 36.2 % (ref 34.0–46.6)
Hemoglobin: 12.2 g/dL (ref 11.1–15.9)
Immature Grans (Abs): 0 10*3/uL (ref 0.0–0.1)
Immature Granulocytes: 0 %
Lymphocytes Absolute: 1.4 10*3/uL (ref 0.7–3.1)
Lymphs: 24 %
MCH: 32.1 pg (ref 26.6–33.0)
MCHC: 33.7 g/dL (ref 31.5–35.7)
MCV: 95 fL (ref 79–97)
Monocytes Absolute: 0.4 10*3/uL (ref 0.1–0.9)
Monocytes: 8 %
Neutrophils Absolute: 3.7 10*3/uL (ref 1.4–7.0)
Neutrophils: 64 %
Platelets: 140 10*3/uL — ABNORMAL LOW (ref 150–450)
RBC: 3.8 x10E6/uL (ref 3.77–5.28)
RDW: 11.9 % (ref 11.7–15.4)
WBC: 5.7 10*3/uL (ref 3.4–10.8)

## 2020-05-14 LAB — CMP14+EGFR
ALT: 25 IU/L (ref 0–32)
AST: 24 IU/L (ref 0–40)
Albumin/Globulin Ratio: 2.1 (ref 1.2–2.2)
Albumin: 3.8 g/dL (ref 3.8–4.9)
Alkaline Phosphatase: 131 IU/L — ABNORMAL HIGH (ref 44–121)
BUN/Creatinine Ratio: 15 (ref 9–23)
BUN: 13 mg/dL (ref 6–24)
Bilirubin Total: 0.9 mg/dL (ref 0.0–1.2)
CO2: 24 mmol/L (ref 20–29)
Calcium: 8.8 mg/dL (ref 8.7–10.2)
Chloride: 106 mmol/L (ref 96–106)
Creatinine, Ser: 0.84 mg/dL (ref 0.57–1.00)
GFR calc Af Amer: 89 mL/min/{1.73_m2} (ref >59)
GFR calc non Af Amer: 77 mL/min/{1.73_m2} (ref >59)
Globulin, Total: 1.8 g/dL (ref 1.5–4.5)
Glucose: 103 mg/dL — ABNORMAL HIGH (ref 65–99)
Potassium: 4.3 mmol/L (ref 3.5–5.2)
Sodium: 144 mmol/L (ref 134–144)
Total Protein: 5.6 g/dL — ABNORMAL LOW (ref 6.0–8.5)

## 2020-05-14 LAB — LIPID PANEL
Chol/HDL Ratio: 2.7 ratio (ref 0.0–4.4)
Cholesterol, Total: 134 mg/dL (ref 100–199)
HDL: 49 mg/dL (ref >39)
LDL Chol Calc (NIH): 65 mg/dL (ref 0–99)
Triglycerides: 111 mg/dL (ref 0–149)
VLDL Cholesterol Cal: 20 mg/dL (ref 5–40)

## 2020-05-14 MED ORDER — PREGABALIN 75 MG PO CAPS: ORAL_CAPSULE | ORAL | 1 refills | Status: DC

## 2020-05-14 MED ORDER — TRAZODONE HCL 50 MG PO TABS: ORAL_TABLET | ORAL | 1 refills | Status: DC

## 2020-05-14 MED ORDER — ATORVASTATIN CALCIUM 40 MG PO TABS: 40.0000 mg | ORAL_TABLET | Freq: Every day | ORAL | 5 refills | Status: DC

## 2020-05-14 MED ORDER — DULOXETINE HCL 60 MG PO CPEP: 60.0000 mg | ORAL_CAPSULE | Freq: Every day | ORAL | 1 refills | Status: DC

## 2020-05-14 MED ORDER — FUROSEMIDE 40 MG PO TABS: ORAL_TABLET | ORAL | 0 refills | Status: DC

## 2020-05-14 MED ORDER — ISOSORBIDE DINITRATE 10 MG PO TABS: 10.0000 mg | ORAL_TABLET | Freq: Two times a day (BID) | ORAL | 6 refills | Status: DC

## 2020-05-14 MED ORDER — LEVETIRACETAM 500 MG PO TABS: ORAL_TABLET | ORAL | 5 refills | Status: DC

## 2020-05-14 NOTE — Progress Notes (Signed)
Subjective:    Patient ID: Angela Michael, female    DOB: 1960-09-28, 60 y.o.   MRN: [id number]   Chief Complaint: Medical Management of Chronic Issues    HPI:  1. Hyperlipidemia with target LDL less than 100 Does not watch diet and does very little exercsie. Lab Results  Component Value Date   CHOL 141 02/07/2020   HDL 48 02/07/2020   LDLCALC 74 02/07/2020   TRIG 105 02/07/2020   CHOLHDL 2.9 02/07/2020     2. Recurrent major depressive disorder, in partial remission ([place]) Stays depressed despite medication. She is on cymbalta and is doing well. Depression screen [facility] 2/9 05/14/2020 02/07/2020 12/22/2019  Decreased Interest 0 0 1  Down, Depressed, Hopeless 0 0 0  PHQ - 2 Score 0 0 1  Altered sleeping 0 - 0  Tired, decreased energy 1 - 1  Change in appetite 0 - 0  Feeling bad or failure about yourself  0 - 0  Trouble concentrating 0 - 0  Moving slowly or fidgety/restless 0 - 0  Suicidal thoughts 0 - 0  PHQ-9 Score 1 - 2  Difficult doing work/chores Not difficult at all - Not difficult at all  Some recent data might be hidden     3. Hypokalemia denies any lower ext cramping Lab Results  Component Value Date   K 4.1 02/07/2020     4. Seizures ([place]) Is on keppra and has not had any seizure activity in several years  5. SVT (supraventricular tachycardia) ([place]) Has had no recent episodes  6. Pancytopenia ([place]) Is doing well. See hematology every 6 months   7. Insomnia, unspecified type Is currently on no medication.  8. Non-Hodgkin's lymphoma, unspecified body region, unspecified non-Hodgkin lymphoma type [facility]) She is currently in [place]. Sees oncology every 3 months  9. Peripheral edema denies any recent edema  10. Morbid obesity ([place]) Weight is up 6 months Wt Readings from Last 3 Encounters:  05/14/20 209 lb (94.8 kg)  02/07/20 203 lb (92.1 kg)  12/22/19 203 lb (92.1 kg)   BMI Readings from Last 3 Encounters:  05/14/20 39.49 kg/m  02/07/20  38.36 kg/m  12/22/19 38.36 kg/m       Outpatient Encounter Medications as of 05/14/2020  Medication Sig  . acetaminophen (TYLENOL) 500 MG tablet Take 1,000 mg every 6 (six) hours as needed by mouth for moderate pain or headache.  Marland Kitchen aspirin 81 MG tablet Take 81 mg by mouth daily. Reported on 06/21/2015  . atorvastatin (LIPITOR) 40 MG tablet Take 1 tablet (40 mg total) by mouth at bedtime.  . cyanocobalamin (,VITAMIN B-12,) 1000 MCG/ML injection Inject 1 mL (1,000 mcg total) into the muscle every 30 (thirty) days.  . cyclobenzaprine (FLEXERIL) 5 MG tablet TAKE 1 TABLET BY MOUTH EVERYDAY AT BEDTIME  . DULoxetine (CYMBALTA) 60 MG capsule TAKE 1 CAPSULE BY MOUTH EVERY DAY  . fluticasone (FLONASE) 50 MCG/ACT nasal spray PLACE 2 SPRAYS INTO THE NOSE AS NEEDED. FOR ALLERGIES  . furosemide (LASIX) 40 MG tablet TAKE 1 TABLET BY MOUTH EVERY DAY  . isosorbide dinitrate (ISORDIL) 10 MG tablet Take 1 tablet (10 mg total) by mouth 2 (two) times daily.  Marland Kitchen levETIRAcetam (KEPPRA) 500 MG tablet TAKE 2 TABLETS BY MOUTH 2 TIMES DAILY  . loratadine (CLARITIN) 10 MG tablet Take 10 mg daily as needed by mouth for allergies.   . Multiple Vitamin (MULTIVITAMIN WITH MINERALS) TABS Take 1 tablet by mouth daily. Reported on 06/21/2015  . nitroGLYCERIN (NITROSTAT)  0.4 MG SL tablet Place 1 tablet (0.4 mg total) under the tongue every 5 (five) minutes as needed for chest pain.  . polyethylene glycol powder (GLYCOLAX/MIRALAX) powder Take 17 g by mouth daily as needed. (Patient taking differently: Take 17 g by mouth daily as needed for moderate constipation.)  . [START ON 05/22/2020] pregabalin (LYRICA) 75 MG capsule Take 1 capsule (75mg ) in the morning, 1 capsule (75mg ) in the afternoon, and 2 capsules (150mg ) at night.  . traZODone (DESYREL) 50 MG tablet TAKE 1 TO 3 TABLETS BY MOUTH AT BEDTIME  . [DISCONTINUED] gabapentin (NEURONTIN) 300 MG capsule TAKE 2 CAPSULE BY MOUTH TID   No facility-administered encounter medications  on file as of 05/14/2020.    Past Surgical History:  Procedure Laterality Date  . ABDOMINOPLASTY N/A 03/02/2017   Procedure: BACK LIPECTOMY;  Surgeon: Irene Limbo, MD;  Location: [facility];  Service: Plastics;  Laterality: N/A;  . ABDOMINOPLASTY/PANNICULECTOMY N/A 03/02/2017   Procedure: ABDOMINOPLASTY/PANNICULECTOMY;  Surgeon: Irene Limbo, MD;  Location: [facility];  Service: Plastics;  Laterality: N/A;  . BREAST SURGERY     " uplift"  . CATARACT EXTRACTION Right   . CATARACT EXTRACTION W/ INTRAOCULAR LENS  IMPLANT, BILATERAL    . CHOLECYSTECTOMY    . COLONOSCOPY    . ENDOVENOUS ABLATION SAPHENOUS VEIN W/ LASER Right 09-08-2013   EVLA right small saphenous vein and stab phlebectomy 10-20 incisions right leg by Curt Jews MD  . GASTRIC BYPASS  2007   Says she had lost 345 lbs  . LYMPH NODE DISSECTION Left    Resected  . lymph node removed from stomach    . MEDIAL PARTIAL KNEE REPLACEMENT  05/10/14   left knee  . MEDIAL PARTIAL KNEE REPLACEMENT Left 06/04/2015   [facility]  . SKIN SURGERY     Removal of excess skin  . TOOTH EXTRACTION Right 02/20/2017  . VENA CAVA FILTER PLACEMENT    . VENOUS THROMBECTOMY     Blood clot resection from right arm    Family History  Problem Relation Age of Onset  . Liver cancer Father   . Diabetes Father   . Kidney cancer Father   . Heart failure Brother   . Heart attack Brother   . Tuberculosis Maternal Grandmother   . Bone cancer Maternal Grandfather   . Diabetes Paternal Uncle   . Lung cancer Mother   . Coronary artery disease Neg Hx   . Sudden death Neg Hx   . Cardiomyopathy Neg Hx   . Esophageal cancer Neg Hx   . Stomach cancer Neg Hx     New complaints: None today  Social history: Both of her daughters are living with her including there significant others  Controlled substance contract: n/a    Review of Systems  Constitutional: Negative for diaphoresis.  Eyes: Negative for pain.  Respiratory: Negative for shortness of  breath.   Cardiovascular: Negative for chest pain, palpitations and leg swelling.  Gastrointestinal: Negative for abdominal pain.  Endocrine: Negative for polydipsia.  Skin: Negative for rash.  Neurological: Negative for dizziness, weakness and headaches.  Hematological: Does not bruise/bleed easily.  All other systems reviewed and are negative.      Objective:   Physical Exam Vitals and nursing note reviewed.  Constitutional:      General: She is not in acute distress.    Appearance: Normal appearance. She is well-developed and well-nourished.  HENT:     Head: Normocephalic.     Nose: Nose normal.  Mouth/Throat:     Mouth: Oropharynx is clear and moist.  Eyes:     Extraocular Movements: EOM normal.     Pupils: Pupils are equal, round, and reactive to light.  Neck:     Vascular: No carotid bruit or JVD.  Cardiovascular:     Rate and Rhythm: Normal rate and regular rhythm.     Pulses: Intact distal pulses.     Heart sounds: Normal heart sounds.  Pulmonary:     Effort: Pulmonary effort is normal. No respiratory distress.     Breath sounds: Normal breath sounds. No wheezing or rales.  Chest:     Chest wall: No tenderness.  Abdominal:     General: Bowel sounds are normal. There is no distension or abdominal bruit. Aorta is normal.     Palpations: Abdomen is soft. There is no hepatomegaly, splenomegaly, mass or pulsatile mass.     Tenderness: There is no abdominal tenderness.  Musculoskeletal:        General: No edema. Normal range of motion.     Cervical back: Normal range of motion and neck supple.  Lymphadenopathy:     Cervical: No cervical adenopathy.  Skin:    General: Skin is warm and dry.  Neurological:     Mental Status: She is alert and oriented to person, place, and time.     Deep Tendon Reflexes: Reflexes are normal and symmetric.  Psychiatric:        Mood and Affect: Mood and affect normal.        Behavior: Behavior normal.        Thought Content:  Thought content normal.        Judgment: Judgment normal.    BP 129/69   Pulse 72   Temp 97.6 F (36.4 C) (Temporal)   Resp 20   Ht 5\' 1"  (1.549 m)   Wt 209 lb (94.8 kg)   SpO2 96%   BMI 39.49 kg/m         Assessment & Plan:  Angela Michael comes in today with chief complaint of Medical Management of Chronic Issues   Diagnosis and orders addressed:  1. Hyperlipidemia with target LDL less than 100 Low fat diet - atorvastatin (LIPITOR) 40 MG tablet; Take 1 tablet (40 mg total) by mouth at bedtime.  Dispense: 30 tablet; Refill: 5  2. Recurrent major depressive disorder, in partial remission ([facility]) Stress management - DULoxetine (CYMBALTA) 60 MG capsule; Take 1 capsule (60 mg total) by mouth daily.  Dispense: 90 capsule; Refill: 1  3. Hypokalemia Labs oending  4. Seizures ([facility]) - levETIRAcetam (KEPPRA) 500 MG tablet; TAKE 2 TABLETS BY MOUTH 2 TIMES DAILY  Dispense: 120 tablet; Refill: 5  5. SVT (supraventricular tachycardia) ([facility]) Avoid caffeine - isosorbide dinitrate (ISORDIL) 10 MG tablet; Take 1 tablet (10 mg total) by mouth 2 (two) times daily.  Dispense: 60 tablet; Refill: 6  6. Pancytopenia ([place]) Labs pending  7. Primary insomnia - traZODone (DESYREL) 50 MG tablet; TAKE 1 TO 3 TABLETS BY MOUTH AT BEDTIME  Dispense: 270 tablet; Refill: 1 Bedtime routine  8. Non-Hodgkin's lymphoma, unspecified body region, unspecified non-Hodgkin lymphoma type [facility]) Keep follow up with oncology  9. Peripheral edema elevate legs when sitting - furosemide (LASIX) 40 MG tablet; TAKE 1 TABLET BY MOUTH EVERY DAY  Dispense: 90 tablet; Refill: 0  10. Morbid obesity ([place]) Discussed diet and exercise for person with BMI >25 Will recheck weight in 3-6 months    Labs pending Health Maintenance  reviewed Diet and exercise encouraged  Follow up plan: 6 months  [place], FNP

## 2020-06-15 ENCOUNTER — Other Ambulatory Visit: Payer: Self-pay | Admitting: Nurse Practitioner

## 2020-06-15 DIAGNOSIS — R609 Edema, unspecified: Secondary | ICD-10-CM

## 2020-08-30 ENCOUNTER — Other Ambulatory Visit: Payer: Self-pay | Admitting: Nurse Practitioner

## 2020-08-30 DIAGNOSIS — G5691 Unspecified mononeuropathy of right upper limb: Secondary | ICD-10-CM

## 2020-10-03 ENCOUNTER — Other Ambulatory Visit: Payer: Self-pay | Admitting: *Deleted

## 2020-10-03 DIAGNOSIS — F3341 Major depressive disorder, recurrent, in partial remission: Secondary | ICD-10-CM

## 2020-10-03 DIAGNOSIS — R609 Edema, unspecified: Secondary | ICD-10-CM

## 2020-10-03 MED ORDER — DULOXETINE HCL 60 MG PO CPEP: 60.0000 mg | ORAL_CAPSULE | Freq: Every day | ORAL | 0 refills | Status: DC

## 2020-10-03 MED ORDER — FUROSEMIDE 40 MG PO TABS: 40.0000 mg | ORAL_TABLET | Freq: Every day | ORAL | 0 refills | Status: DC

## 2020-10-05 ENCOUNTER — Telehealth: Payer: Self-pay | Admitting: *Deleted

## 2020-10-05 NOTE — Telephone Encounter (Signed)
Fax from [facility] for [place] cap Not on current med list Last OV 05/14/20 Next OV 11/09/20

## 2020-10-08 MED ORDER — FOLIVANE-F 125-1 MG PO CAPS: 1.0000 | ORAL_CAPSULE | Freq: Every day | ORAL | 1 refills | Status: DC

## 2020-10-08 NOTE — Addendum Note (Signed)
Addended by: Chevis Pretty on: 10/08/2020 04:24 PM   Modules accepted: Orders

## 2020-11-09 ENCOUNTER — Encounter: Payer: Self-pay | Admitting: Nurse Practitioner

## 2020-11-09 ENCOUNTER — Ambulatory Visit (INDEPENDENT_AMBULATORY_CARE_PROVIDER_SITE_OTHER): Payer: Medicaid Other | Admitting: Nurse Practitioner

## 2020-11-09 ENCOUNTER — Other Ambulatory Visit: Payer: Self-pay

## 2020-11-09 VITALS — BP 117/77 | HR 61 | Temp 97.6°F | Resp 20 | Ht 61.0 in | Wt 207.0 lb

## 2020-11-09 DIAGNOSIS — D61818 Other pancytopenia: Secondary | ICD-10-CM

## 2020-11-09 DIAGNOSIS — R569 Unspecified convulsions: Secondary | ICD-10-CM

## 2020-11-09 DIAGNOSIS — F3341 Major depressive disorder, recurrent, in partial remission: Secondary | ICD-10-CM

## 2020-11-09 DIAGNOSIS — I471 Supraventricular tachycardia, unspecified: Secondary | ICD-10-CM

## 2020-11-09 DIAGNOSIS — E876 Hypokalemia: Secondary | ICD-10-CM

## 2020-11-09 DIAGNOSIS — C8590 Non-Hodgkin lymphoma, unspecified, unspecified site: Secondary | ICD-10-CM

## 2020-11-09 DIAGNOSIS — R600 Localized edema: Secondary | ICD-10-CM

## 2020-11-09 DIAGNOSIS — E785 Hyperlipidemia, unspecified: Secondary | ICD-10-CM

## 2020-11-09 DIAGNOSIS — I8393 Asymptomatic varicose veins of bilateral lower extremities: Secondary | ICD-10-CM | POA: Diagnosis not present

## 2020-11-09 DIAGNOSIS — G5691 Unspecified mononeuropathy of right upper limb: Secondary | ICD-10-CM | POA: Diagnosis not present

## 2020-11-09 DIAGNOSIS — R609 Edema, unspecified: Secondary | ICD-10-CM

## 2020-11-09 DIAGNOSIS — G4700 Insomnia, unspecified: Secondary | ICD-10-CM

## 2020-11-09 DIAGNOSIS — F5101 Primary insomnia: Secondary | ICD-10-CM

## 2020-11-09 MED ORDER — TRAZODONE HCL 50 MG PO TABS: ORAL_TABLET | ORAL | 1 refills | Status: DC

## 2020-11-09 MED ORDER — ISOSORBIDE DINITRATE 10 MG PO TABS: 10.0000 mg | ORAL_TABLET | Freq: Two times a day (BID) | ORAL | 1 refills | Status: DC

## 2020-11-09 MED ORDER — DULOXETINE HCL 60 MG PO CPEP: 60.0000 mg | ORAL_CAPSULE | Freq: Every day | ORAL | 1 refills | Status: DC

## 2020-11-09 MED ORDER — FOLIVANE-F 125-1 MG PO CAPS: 1.0000 | ORAL_CAPSULE | Freq: Every day | ORAL | 1 refills | Status: DC

## 2020-11-09 MED ORDER — LEVETIRACETAM 500 MG PO TABS: ORAL_TABLET | ORAL | 1 refills | Status: DC

## 2020-11-09 MED ORDER — ATORVASTATIN CALCIUM 40 MG PO TABS: 40.0000 mg | ORAL_TABLET | Freq: Every day | ORAL | 1 refills | Status: DC

## 2020-11-09 MED ORDER — FUROSEMIDE 40 MG PO TABS: 40.0000 mg | ORAL_TABLET | Freq: Every day | ORAL | 1 refills | Status: DC

## 2020-11-09 NOTE — Addendum Note (Signed)
Addended by: Chevis Pretty on: 11/09/2020 11:49 AM   Modules accepted: Orders

## 2020-11-09 NOTE — Progress Notes (Addendum)
Subjective:    Patient ID: Angela Michael, female    DOB: Jul 05, 1960, 60 y.o.   MRN: [id number]   Chief Complaint: medical management of chronic issues     HPI:  1. SVT (supraventricular tachycardia) (HCC) No recent runs of SVT.  2. Asymptomatic varicose veins of both lower extremities Has multiple varicose veins in bil lower ext. Does not wear compression hose or socks.  3. Neuropathy of right upper extremity She see painmanagement for medications for this. Pain is unchanged  4. Pancytopenia ([place]) Lab Results  Component Value Date   WBC 5.7 05/14/2020   HGB 12.2 05/14/2020   HCT 36.2 05/14/2020   MCV 95 05/14/2020   PLT 140 (L) 05/14/2020     5. Hyperlipidemia with target LDL less than 100 Does not watch diet and does no dedicate dexercise. Lab Results  Component Value Date   CHOL 134 05/14/2020   HDL 49 05/14/2020   LDLCALC 65 05/14/2020   TRIG 111 05/14/2020   CHOLHDL 2.7 05/14/2020     6. Recurrent major depressive disorder, in partial remission ([place]) Is on cymbalta and is doing well. Depression screen [facility] 2/9 11/09/2020 05/14/2020 02/07/2020  Decreased Interest 0 0 0  Down, Depressed, Hopeless 0 0 0  PHQ - 2 Score 0 0 0  Altered sleeping 0 0 -  Tired, decreased energy 0 1 -  Change in appetite 0 0 -  Feeling bad or failure about yourself  0 0 -  Trouble concentrating 0 0 -  Moving slowly or fidgety/restless 0 0 -  Suicidal thoughts 0 0 -  PHQ-9 Score 0 1 -  Difficult doing work/chores Not difficult at all Not difficult at all -  Some recent data might be hidden     7. Hypokalemia No c/o lower ext cramping Lab Results  Component Value Date   K 4.3 05/14/2020     8. Insomnia, unspecified type Is on trazadone to sleep. Sleeps about 3-4 hours at a time.  9. Peripheral edema Has some edema by the end of each day.  10. Non-Hodgkin's lymphoma, unspecified body region, unspecified non-Hodgkin lymphoma type [facility]) Sees oncology every 3 months.  Has had no changes  11. Morbid obesity ([place]) No recent weight changes Wt Readings from Last 3 Encounters:  11/09/20 207 lb (93.9 kg)  05/14/20 209 lb (94.8 kg)  02/07/20 203 lb (92.1 kg)    BMI Readings from Last 3 Encounters:  05/14/20 39.49 kg/m  02/07/20 38.36 kg/m  12/22/19 38.36 kg/m       Outpatient Encounter Medications as of 11/09/2020  Medication Sig   acetaminophen (TYLENOL) 500 MG tablet Take 1,000 mg every 6 (six) hours as needed by mouth for moderate pain or headache.   aspirin 81 MG tablet Take 81 mg by mouth daily. Reported on 06/21/2015   atorvastatin (LIPITOR) 40 MG tablet Take 1 tablet (40 mg total) by mouth at bedtime.   cyanocobalamin (,VITAMIN B-12,) 1000 MCG/ML injection Inject 1 mL (1,000 mcg total) into the muscle every 30 (thirty) days.   cyclobenzaprine (FLEXERIL) 5 MG tablet TAKE 1 TABLET BY MOUTH EVERYDAY AT BEDTIME   DULoxetine (CYMBALTA) 60 MG capsule Take 1 capsule (60 mg total) by mouth daily.   Fe Fum-FePoly-FA-Vit C-Vit B3 (FOLIVANE-F) 125-1 MG CAPS Take 1 capsule by mouth daily.   fluticasone (FLONASE) 50 MCG/ACT nasal spray PLACE 2 SPRAYS INTO THE NOSE AS NEEDED. FOR ALLERGIES   furosemide (LASIX) 40 MG tablet Take 1 tablet (40 mg total)  by mouth daily.   isosorbide dinitrate (ISORDIL) 10 MG tablet Take 1 tablet (10 mg total) by mouth 2 (two) times daily.   levETIRAcetam (KEPPRA) 500 MG tablet TAKE 2 TABLETS BY MOUTH 2 TIMES DAILY   loratadine (CLARITIN) 10 MG tablet Take 10 mg daily as needed by mouth for allergies.    Multiple Vitamin (MULTIVITAMIN WITH MINERALS) TABS Take 1 tablet by mouth daily. Reported on 06/21/2015   nitroGLYCERIN (NITROSTAT) 0.4 MG SL tablet Place 1 tablet (0.4 mg total) under the tongue every 5 (five) minutes as needed for chest pain.   polyethylene glycol powder (GLYCOLAX/MIRALAX) powder Take 17 g by mouth daily as needed. (Patient taking differently: Take 17 g by mouth daily as needed for moderate constipation.)    pregabalin (LYRICA) 75 MG capsule Take 1 capsule (45m) in the morning, 1 capsule (764m in the afternoon, and 2 capsules (15017mat night.   traZODone (DESYREL) 50 MG tablet TAKE 1 TO 3 TABLETS BY MOUTH AT BEDTIME   No facility-administered encounter medications on file as of 11/09/2020.    Past Surgical History:  Procedure Laterality Date   ABDOMINOPLASTY N/A 03/02/2017   Procedure: BACK LIPECTOMY;  Surgeon: ThiIrene LimboD;  [facility] [place] [place]  Laterality: N/A;   ABDOMINOPLASTY/PANNICULECTOMY N/A 03/02/2017   Procedure: ABDOMINOPLASTY/PANNICULECTOMY;  Surgeon: ThiIrene LimboD;  [facility] [place] [place]  Laterality: N/A;   BREAST SURGERY     " uplift"   CATARACT EXTRACTION Right    CATARACT EXTRACTION W/ INTRAOCULAR LENS  IMPLANT, BILATERAL     CHOLECYSTECTOMY     COLONOSCOPY     ENDOVENOUS ABLATION SAPHENOUS VEIN W/ LASER Right 09-08-2013   EVLA right small saphenous vein and stab phlebectomy 10-20 incisions right leg by TodCurt Jews   GASTRIC BYPASS  2007   Says she had lost 345 lbs   LYMPH NODE DISSECTION Left    Resected   lymph node removed from stomach     MEDIAL PARTIAL KNEE REPLACEMENT  05/10/14   left knee   MEDIAL PARTIAL KNEE REPLACEMENT Left 06/04/2015   [id number]     Removal of excess skin   TOOTH EXTRACTION Right 02/20/2017   VENA CAVA FILTER PLACEMENT     VENOUS THROMBECTOMY     Blood clot resection from right arm    Family History  Problem Relation Age of Onset   Liver cancer Father    Diabetes Father    Kidney cancer Father    Heart failure Brother    Heart attack Brother    Tuberculosis Maternal Grandmother    Bone cancer Maternal Grandfather    Diabetes Paternal Uncle    Lung cancer Mother    Coronary artery disease Neg Hx    Sudden death Neg Hx    Cardiomyopathy Neg Hx    Esophageal cancer Neg Hx    Stomach cancer Neg Hx     New complaints: Nothing more then added stress from family  situations  Social history: Lives with her husband and both of her daughters and there families live with her.  Controlled substance contract: n/a      Review of Systems  Constitutional:  Negative for diaphoresis.  Eyes:  Negative for pain.  Respiratory:  Negative for shortness of breath.   Cardiovascular:  Negative for chest pain, palpitations and leg swelling.  Gastrointestinal:  Negative for abdominal pain.  Endocrine: Negative for polydipsia.  Skin:  Negative for rash.  Neurological:  Negative for dizziness, weakness and headaches.  Hematological:  Does not bruise/bleed easily.  All other systems reviewed and are negative.     Objective:   Physical Exam Vitals and nursing note reviewed.  Constitutional:      General: She is not in acute distress.    Appearance: Normal appearance. She is well-developed.  HENT:     Head: Normocephalic.     Right Ear: Tympanic membrane normal.     Left Ear: Tympanic membrane normal.     Nose: Nose normal.     Mouth/Throat:     Mouth: Mucous membranes are moist.  Eyes:     Pupils: Pupils are equal, round, and reactive to light.  Neck:     Vascular: No carotid bruit or JVD.  Cardiovascular:     Rate and Rhythm: Normal rate and regular rhythm.     Heart sounds: Normal heart sounds.  Pulmonary:     Effort: Pulmonary effort is normal. No respiratory distress.     Breath sounds: Normal breath sounds. No wheezing or rales.  Chest:     Chest wall: No tenderness.  Abdominal:     General: Bowel sounds are normal. There is no distension or abdominal bruit.     Palpations: Abdomen is soft. There is no hepatomegaly, splenomegaly, mass or pulsatile mass.     Tenderness: There is no abdominal tenderness.  Musculoskeletal:        General: Normal range of motion.     Cervical back: Normal range of motion and neck supple.  Lymphadenopathy:     Cervical: No cervical adenopathy.  Skin:    General: Skin is warm and dry.  Neurological:      Mental Status: She is alert and oriented to person, place, and time.     Deep Tendon Reflexes: Reflexes are normal and symmetric.  Psychiatric:        Behavior: Behavior normal.        Thought Content: Thought content normal.        Judgment: Judgment normal.    BP 117/77   Pulse 61   Temp 97.6 F (36.4 C) (Temporal)   Resp 20   Ht _0  (1.549 m)   Wt 207 lb (93.9 kg)   SpO2 95%   BMI 39.11 kg/m        Assessment & Plan:  Zakia Sainato comes in today with chief complaint of Medical Management of Chronic Issues (Wants to increase cymbalta)   Diagnosis and orders addressed:  1. SVT (supraventricular tachycardia) (HCC) Avoid caffeine - isosorbide dinitrate (ISORDIL) 10 MG tablet; Take 1 tablet (10 mg total) by mouth 2 (two) times daily.  Dispense: 180 tablet; Refill: 1  2. Asymptomatic varicose veins of both lower extremities Wear compression socks when legs are hurting  3. Neuropathy of right upper extremity Keep follow up with pain management  4. Pancytopenia ([place]) Labs pe3nding  5. Hyperlipidemia with target LDL less than 100 Low fat diet - atorvastatin (LIPITOR) 40 MG tablet; Take 1 tablet (40 mg total) by mouth at bedtime.  Dispense: 90 tablet; Refill: 1  6. Recurrent major depressive disorder, in partial remission (HCC) Stress maanagement - DULoxetine (CYMBALTA) 60 MG capsule; Take 1 capsule (60 mg total) by mouth daily.  Dispense: 90 capsule; Refill: 1  7. Hypokalemia Labs pending  8. Insomnia, unspecified type bedtimeroutine  9. Peripheral edema Elevate legs when sitting - furosemide (LASIX) 40 MG tablet; Take 1 tablet (40 mg total) by mouth daily.  Dispense:  90 tablet; Refill: 1  10. Non-Hodgkin's lymphoma, unspecified body region, unspecified non-Hodgkin lymphoma type [facility]) Keep follow yup with oncology  11. Morbid obesity ([place]) Discussed diet and exercise for person with BMI >25 Will recheck weight in 3-6 months  12. Primary  insomnia Bedtime routine - traZODone (DESYREL) 50 MG tablet; TAKE 1 TO 3 TABLETS BY MOUTH AT BEDTIME  Dispense: 270 tablet; Refill: 1  13. Seizures (HCC) - levETIRAcetam (KEPPRA) 500 MG tablet; TAKE 2 TABLETS BY MOUTH 2 TIMES DAILY  Dispense: 360 tablet; Refill: 1  Orders Placed This Encounter  Procedures   CBC with Differential/Platelet   CMP14+EGFR   Lipid panel    Labs pending Health Maintenance reviewed Diet and exercise encouraged  Follow up plan: 6 months   Mary-Margaret Hassell Done, FNP

## 2020-11-09 NOTE — Patient Instructions (Signed)
Varicose Veins Varicose veins are veins that have become enlarged, bulged, and twisted. Theymost often appear in the legs. What are the causes? This condition is caused by damage to the valves in the vein. These valves help blood return to your heart. When they are damaged and they stop working properly, blood may flow backward and back up in the veins near the skin,causing the veins to get larger and appear twisted. The condition can result from any issue that causes blood to back up, likepregnancy, prolonged standing, or obesity. What increases the risk? This condition is more likely to develop in people who are: On their feet a lot. Pregnant. Overweight. What are the signs or symptoms? Symptoms of this condition include: Bulging, twisted, and bluish veins. A feeling of heaviness. This may be worse at the end of the day. Leg pain. This may be worse at the end of the day. Swelling in the leg. Changes in skin color over the veins. How is this diagnosed? This condition may be diagnosed based on your symptoms, a physical exam, and anultrasound test. How is this treated? Treatment for this condition may involve: Avoiding sitting or standing in one position for long periods of time. Wearing compression stockings. These stockings help to prevent blood clots and reduce swelling in the legs. Raising (elevating) the legs when resting. Losing weight. Exercising regularly. If you have persistent symptoms or want to improve the way your varicose veins look, you may choose to have a procedure to close the varicose veins off or toremove them. Treatments to close off the veins include: Sclerotherapy. In this treatment, a solution is injected into a vein to close it off. Laser treatment. In this treatment, the vein is heated with a laser to close it off. Radiofrequency vein ablation. In this treatment, an electrical current produced by radio waves is used to close off the vein. Treatments to remove  the veins include: Phlebectomy. In this treatment, the veins are removed through small incisions made over the veins. Vein ligation and stripping. In this treatment, incisions are made over the veins. The veins are then removed after being tied (ligated) with stitches (sutures). Follow these instructions at home: Activity Walk as much as possible. Walking increases blood flow. This helps blood return to the heart and takes pressure off your veins. It also increases your cardiovascular strength. Follow your health care provider's instructions about exercising. Do not stand or sit in one position for a long period of time. Do not sit with your legs crossed. Rest with your legs raised during the day. General instructions  Follow any diet instructions given to you by your health care provider. Wear compression stockings as directed by your health care provider. Do not wear other kinds of tight clothing around your legs, pelvis, or waist. Elevate your legs at night to above the level of your heart. If you get a cut in the skin over the varicose vein and the vein bleeds: Lie down with your leg raised. Apply firm pressure to the cut with a clean cloth until the bleeding stops. Place a bandage (dressing) on the cut.  Contact a health care provider if: The skin around your varicose veins starts to break down. You have pain, redness, tenderness, or hard swelling over a vein. You are uncomfortable because of pain. You get a cut in the skin over a varicose vein and it will not stop bleeding. Summary Varicose veins are veins that have become enlarged, bulged, and twisted. They most often  appear in the legs. This condition is caused by damage to the valves in the vein. These valves help blood return to your heart. Treatment for this condition includes frequent movements, wearing compression stockings, losing weight, and exercising regularly. In some cases, procedures are done to close off or remove the  veins. Treatment for this condition may include wearing compression stockings, elevating the legs, losing weight, and engaging in regular activity. In some cases, procedures are done to close off or remove the veins. This information is not intended to replace advice given to you by your health care provider. Make sure you discuss any questions you have with your healthcare provider. Document Revised: 08/11/2019 Document Reviewed: 08/11/2019 Elsevier Patient Education  [place].

## 2020-11-10 LAB — CBC WITH DIFFERENTIAL/PLATELET
Basophils Absolute: 0 10*3/uL (ref 0.0–0.2)
Basos: 1 %
EOS (ABSOLUTE): 0.1 10*3/uL (ref 0.0–0.4)
Eos: 2 %
Hematocrit: 38.5 % (ref 34.0–46.6)
Hemoglobin: 13.2 g/dL (ref 11.1–15.9)
Immature Grans (Abs): 0 10*3/uL (ref 0.0–0.1)
Immature Granulocytes: 1 %
Lymphocytes Absolute: 1.2 10*3/uL (ref 0.7–3.1)
Lymphs: 21 %
MCH: 32.8 pg (ref 26.6–33.0)
MCHC: 34.3 g/dL (ref 31.5–35.7)
MCV: 96 fL (ref 79–97)
Monocytes Absolute: 0.3 10*3/uL (ref 0.1–0.9)
Monocytes: 6 %
Neutrophils Absolute: 4.1 10*3/uL (ref 1.4–7.0)
Neutrophils: 69 %
Platelets: 181 10*3/uL (ref 150–450)
RBC: 4.03 x10E6/uL (ref 3.77–5.28)
RDW: 12.1 % (ref 11.7–15.4)
WBC: 5.8 10*3/uL (ref 3.4–10.8)

## 2020-11-10 LAB — CMP14+EGFR
ALT: 37 IU/L — ABNORMAL HIGH (ref 0–32)
AST: 30 IU/L (ref 0–40)
Albumin/Globulin Ratio: 1.9 (ref 1.2–2.2)
Albumin: 3.8 g/dL (ref 3.8–4.9)
Alkaline Phosphatase: 152 IU/L — ABNORMAL HIGH (ref 44–121)
BUN/Creatinine Ratio: 21 (ref 9–23)
BUN: 16 mg/dL (ref 6–24)
Bilirubin Total: 0.6 mg/dL (ref 0.0–1.2)
CO2: 27 mmol/L (ref 20–29)
Calcium: 8.8 mg/dL (ref 8.7–10.2)
Chloride: 105 mmol/L (ref 96–106)
Creatinine, Ser: 0.77 mg/dL (ref 0.57–1.00)
Globulin, Total: 2 g/dL (ref 1.5–4.5)
Glucose: 101 mg/dL — ABNORMAL HIGH (ref 65–99)
Potassium: 4.5 mmol/L (ref 3.5–5.2)
Sodium: 145 mmol/L — ABNORMAL HIGH (ref 134–144)
Total Protein: 5.8 g/dL — ABNORMAL LOW (ref 6.0–8.5)
eGFR: 89 mL/min/{1.73_m2} (ref >59)

## 2020-11-10 LAB — LIPID PANEL
Chol/HDL Ratio: 3 ratio (ref 0.0–4.4)
Cholesterol, Total: 146 mg/dL (ref 100–199)
HDL: 48 mg/dL (ref >39)
LDL Chol Calc (NIH): 78 mg/dL (ref 0–99)
Triglycerides: 113 mg/dL (ref 0–149)
VLDL Cholesterol Cal: 20 mg/dL (ref 5–40)

## 2020-11-28 ENCOUNTER — Encounter: Payer: Self-pay | Admitting: *Deleted

## 2020-11-28 NOTE — Progress Notes (Signed)
Cardiology Office Note  Date: 11/29/2020   ID: Angela Michael, DOB [birth year], MRN [id number]  PCP:  Chevis Pretty, FNP  Cardiologist:  None Electrophysiologist:  None   Chief Complaint: Pre op clearance for incisional hernia repair   History of Present Illness: Angela Michael is a 60 y.o. female with a history of CAD, anemia, chronic back pain, HLD, heart mumur, history of syncope, lymphoma, edema, PVC's, SVT, varicose veins.  She was last seen by Dr. Bronson Ing via telemedicine visit on 07/28/2018.  She had previously had a negative nuclear stress test at [facility] in August 2016.  Had a cardiac catheterization at [facility] January 2017 demonstrating 50% proximal RCA stenosis with 30% proximal and 50% mid LAD stenosis.  FFR LAD was 0.85.  History of elevated pulmonary pressures, obesity, DVT with IVC filter and SVT.  She had been experiencing more frequent chest pains over the prior month and had taken nitroglycerin 4 times.  Each episode occurred at rest.  She described it as heaviness and central part of chest radiating to back and shoulder blades.  Had some associated lightheadedness.  She denied any shortness of breath or palpitations.  She described tenderness under left breast.  She was started on Isordil 10 mg p.o. twice daily.  Plan was to schedule a coronary CT.  She was having no symptoms of PSVT to recurrence.  She was on statin medication with LDL of 59 on February 2020.  History of DVT with an IVC filter.  She is here today for follow-up and for preop clearance to undergo surgery for incisional hernia repair.  She denies any recent issues other than stating she had an issue with her heart rate yesterday and took a sublingual nitroglycerin which she describes is slowing her heart rate down.  She denied any complaints of chest pain as a reason for taking the nitroglycerin.  She currently denies anginal or exertional symptoms, orthostatic symptoms, CVA or TIA-like  symptoms, PND, orthopnea, palpitations.  No claudication-like symptoms, DVT or PE-like symptoms, or lower extremity edema.  EKG today shows normal sinus rhythm rate of 77 with right bundle branch block, possible lateral infarct, age undetermined.  She has pending incisional hernia repair robotic withFernandez, Louie Boston, MD General Surgery NPI: [id number]   [address] [place] [address]   Phone: [phone] Fax: [phone]  Past Medical History:  Diagnosis Date   Anemia    CAD (coronary artery disease)    RCA 50%, LAD 30% cath 2017.    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   Neuropathy    Panniculitis    Peripheral edema    PVC's (premature ventricular contractions)    Seizures (HCC)    Sleep apnea    no longer needed it after weight loss   SVT (supraventricular tachycardia) (HCC)    Varicose veins of both lower extremities    Wears glasses     Past Surgical History:  Procedure Laterality Date   ABDOMINOPLASTY N/A 03/02/2017   Procedure: BACK LIPECTOMY;  Surgeon: Irene Limbo, MD;  Location: [place];  Service: Plastics;  Laterality: N/A;   ABDOMINOPLASTY/PANNICULECTOMY N/A 03/02/2017   Procedure: ABDOMINOPLASTY/PANNICULECTOMY;  Surgeon: Irene Limbo, MD;  Location: [place];  Service: Clinical cytogeneticist;  Laterality: N/A;   BREAST SURGERY     " uplift"   CATARACT EXTRACTION Right    CATARACT EXTRACTION W/ INTRAOCULAR LENS  IMPLANT, BILATERAL     CHOLECYSTECTOMY     COLONOSCOPY     ENDOVENOUS ABLATION SAPHENOUS VEIN W/ LASER Right 09-08-2013   EVLA right small saphenous vein and stab phlebectomy 10-20 incisions right leg by Curt Jews MD   GASTRIC BYPASS  2007   Says she had lost 345 lbs   LYMPH NODE DISSECTION Left    Resected   lymph node removed from stomach     MEDIAL PARTIAL KNEE REPLACEMENT   05/10/14   left knee   MEDIAL PARTIAL KNEE REPLACEMENT Left 06/04/2015   [facility]   SKIN SURGERY     Removal of excess skin   TOOTH EXTRACTION Right 02/20/2017   VENA CAVA FILTER PLACEMENT     VENOUS THROMBECTOMY     Blood clot resection from right arm    Current Outpatient Medications  Medication Sig Dispense Refill   acetaminophen (TYLENOL) 500 MG tablet Take 1,000 mg every 6 (six) hours as needed by mouth for moderate pain or headache.     aspirin 81 MG tablet Take 81 mg by mouth daily. Reported on 06/21/2015     atorvastatin (LIPITOR) 40 MG tablet Take 1 tablet (40 mg total) by mouth at bedtime. 90 tablet 1   cyanocobalamin (,VITAMIN B-12,) 1000 MCG/ML injection Inject 1 mL (1,000 mcg total) into the muscle every 30 (thirty) days. 30 mL 1   cyclobenzaprine (FLEXERIL) 5 MG tablet TAKE 1 TABLET BY MOUTH EVERYDAY AT BEDTIME 21 tablet 8   DULoxetine (CYMBALTA) 60 MG capsule Take 1 capsule (60 mg total) by mouth daily. 90 capsule 1   Fe Fum-FePoly-FA-Vit C-Vit B3 (FOLIVANE-F) 125-1 MG CAPS Take 1 capsule by mouth daily. 90 capsule 1   fluticasone (FLONASE) 50 MCG/ACT nasal spray PLACE 2 SPRAYS INTO THE NOSE AS NEEDED. FOR ALLERGIES 16 g 3   furosemide (LASIX) 40 MG tablet Take 1 tablet (40 mg total) by mouth daily. 90 tablet 1   isosorbide dinitrate (ISORDIL) 10 MG tablet Take 1 tablet (10 mg total) by mouth 2 (two) times daily. 180 tablet 1   levETIRAcetam (KEPPRA) 500 MG tablet TAKE 2 TABLETS BY MOUTH 2 TIMES DAILY 360 tablet 1   loratadine (CLARITIN) 10 MG tablet Take 10 mg daily as needed by mouth for allergies.      Multiple Vitamin (MULTIVITAMIN WITH MINERALS) TABS Take 1 tablet by mouth daily. Reported on 06/21/2015     nitroGLYCERIN (NITROSTAT) 0.4 MG SL tablet Place 1 tablet (0.4 mg total) under the tongue every 5 (five) minutes as needed for chest pain. 25 tablet 3   polyethylene glycol powder (GLYCOLAX/MIRALAX) powder Take 17 g by mouth daily as needed. (Patient taking differently:  Take 17 g by mouth daily as needed for moderate constipation.) 3350 g 1   pregabalin (LYRICA) 75 MG capsule Take 1 capsule ('75mg'$ ) in the morning, 1 capsule ('75mg'$ ) in the afternoon, and 2 capsules ('150mg'$ ) at night. 90 capsule 1   traZODone (DESYREL) 50 MG tablet TAKE 1 TO 3 TABLETS BY MOUTH AT BEDTIME 270 tablet 1   No current facility-administered medications for this visit.   Allergies:  Advicor [niacin-lovastatin er] and Niacin   Social History: The patient  reports that she has never smoked. She has never used smokeless tobacco. She reports current alcohol use. She reports that she does not use  drugs.   Family History: The patient's family history includes Bone cancer in her maternal grandfather; Diabetes in her father and paternal uncle; Heart attack in her brother; Heart failure in her brother; Kidney cancer in her father; Liver cancer in her father; Lung cancer in her mother; Tuberculosis in her maternal grandmother.   ROS:  Please see the history of present illness. Otherwise, complete review of systems is positive for none.  All other systems are reviewed and negative.   Physical Exam: VS:  BP 104/64   Pulse 76   Ht '5\' 1"'$  (1.549 m)   Wt 213 lb 6.4 oz (96.8 kg)   SpO2 97%   BMI 40.32 kg/m , BMI Body mass index is 40.32 kg/m.  Wt Readings from Last 3 Encounters:  11/29/20 213 lb 6.4 oz (96.8 kg)  11/09/20 207 lb (93.9 kg)  05/14/20 209 lb (94.8 kg)    General: Patient appears comfortable at rest. Neck: Supple, no elevated JVP or carotid bruits, no thyromegaly. Lungs: Clear to auscultation, nonlabored breathing at rest. Cardiac: Regular rate and rhythm, no S3 or significant systolic murmur, no pericardial rub. Extremities: No pitting edema, distal pulses 2+. Skin: Warm and dry. Musculoskeletal: No kyphosis. Neuropsychiatric: Alert and oriented x3, affect grossly appropriate.  ECG: 11/29/2020 EKG normal sinus rhythm rate of 77, right bundle branch block, possible lateral  infarct, age undetermined.  Recent Labwork: 02/07/2020: TSH 1.430 11/09/2020: ALT 37; AST 30; BUN 16; Creatinine, Ser 0.77; Hemoglobin 13.2; Platelets 181; Potassium 4.5; Sodium 145     Component Value Date/Time   CHOL 146 11/09/2020 1152   CHOL 150 08/06/2012 0950   TRIG 113 11/09/2020 1152   TRIG 93 06/23/2014 1242   TRIG 103 08/06/2012 0950   HDL 48 11/09/2020 1152   HDL 56 06/23/2014 1242   HDL 44 08/06/2012 0950   CHOLHDL 3.0 11/09/2020 1152   LDLCALC 78 11/09/2020 1152   LDLCALC 78 12/05/2013 1022   LDLCALC 85 08/06/2012 0950    Other Studies Reviewed Today:  Coronary CT 10/06/2018 Study Result  Narrative & Impression  CLINICAL DATA:  Chest pain   EXAM: CT FFR   MEDICATIONS: No additional medications.   TECHNIQUE: The coronary CTA was sent for FFR   FINDINGS: FFR 0.88 distal RCA.   FFR 0.88 distal LAD   IMPRESSION: RCA and LAD disease does not appear to be hemodynamically significant.      Assessment and Plan:  1. Pre-operative clearance   2. CAD in native artery   3. SVT (supraventricular tachycardia) ([place])   4. Mixed hyperlipidemia   5. History of DVT (deep vein thrombosis)    1. Pre-operative clearance She is pending incisional hernia repair by Dr. Toney Rakes [facility].  Her revised cardiac risk index equals 1 placing her at a perioperative risk of major cardiac event at 0.9%.  Her Duke activity status index score was 34.7 giving her a functional capacity and METS of 7.01.  From a cardiac standpoint she is cleared to undergo hernia repair under general anesthesia.  2. CAD in native artery She denies any anginal or exertional symptoms.  Previous cardiac catheterization per provider notes at [facility] in January 2017 demonstrated 50% proximal RCA stenosis with 30% proximal and 50% mid LAD stenosis.  FFR LAD was 0.85. Subsequent coronary CTA in 2020 demonstrated FFR of 0.88 distal RCA and FFR 0.88 distal LAD.  RCA and LAD  disease did not appear to be hemodynamically significant.  Continue aspirin 81  mg daily.  Continue Isordil 10 mg p.o. twice daily.  Continue sublingual nitroglycerin as needed.  3. SVT (supraventricular tachycardia) (HCC) Patient denies any significant issues with palpitations or arrhythmias.  She states she did take a sublingual nitroglycerin for abnormal heart rate yesterday which resolved the issue.  4. Mixed hyperlipidemia Continue atorvastatin 40 mg p.o. daily.  5. History of DVT (deep vein thrombosis) No recent DVT or PE-like symptoms.  6.  Lower extremity edema Continue furosemide 40 mg p.o. daily.  Medication Adjustments/Labs and Tests Ordered: Current medicines are reviewed at length with the patient today.  Concerns regarding medicines are outlined above.   Disposition: Follow-up with Dr. Harl Bowie or APP 6 months  Signed, Levell July, NP 11/29/2020 11:31 AM    [facility] [facility] [place], [place], [place] [address] Phone: [phone]; Fax: [phone]

## 2020-11-29 ENCOUNTER — Other Ambulatory Visit: Payer: Self-pay

## 2020-11-29 ENCOUNTER — Ambulatory Visit (INDEPENDENT_AMBULATORY_CARE_PROVIDER_SITE_OTHER): Payer: Medicaid Other | Admitting: Family Medicine

## 2020-11-29 ENCOUNTER — Encounter: Payer: Self-pay | Admitting: Family Medicine

## 2020-11-29 VITALS — BP 104/64 | HR 76 | Ht 61.0 in | Wt 213.4 lb

## 2020-11-29 DIAGNOSIS — E782 Mixed hyperlipidemia: Secondary | ICD-10-CM

## 2020-11-29 DIAGNOSIS — I471 Supraventricular tachycardia: Secondary | ICD-10-CM

## 2020-11-29 DIAGNOSIS — I2510 Atherosclerotic heart disease of native coronary artery without angina pectoris: Secondary | ICD-10-CM

## 2020-11-29 DIAGNOSIS — Z01818 Encounter for other preprocedural examination: Secondary | ICD-10-CM

## 2020-11-29 DIAGNOSIS — Z86718 Personal history of other venous thrombosis and embolism: Secondary | ICD-10-CM

## 2020-11-29 NOTE — Patient Instructions (Addendum)

## 2021-01-01 ENCOUNTER — Other Ambulatory Visit: Payer: Self-pay | Admitting: Nurse Practitioner

## 2021-01-11 ENCOUNTER — Telehealth: Payer: Self-pay | Admitting: Nurse Practitioner

## 2021-01-11 DIAGNOSIS — F3341 Major depressive disorder, recurrent, in partial remission: Secondary | ICD-10-CM

## 2021-01-11 MED ORDER — DULOXETINE HCL 60 MG PO CPEP: 120.0000 mg | ORAL_CAPSULE | Freq: Every day | ORAL | 1 refills | Status: DC

## 2021-01-11 NOTE — Telephone Encounter (Signed)
New rx sent in for Cymbalta- to take two daily

## 2021-01-11 NOTE — Telephone Encounter (Signed)
Patient aware and verbalizes understanding. 

## 2021-01-11 NOTE — Telephone Encounter (Signed)
Pt called stating that when she had a visit with her therapist in October of 2021, she was advised to start taking 2 tablets of her Duloxetine per day.Roney Jaffe MMM has her only taking 1 tablet per day and is requesting that MMM send in new script with her being able to take 2 tablets per day.  Please advise and call patient.

## 2021-04-01 ENCOUNTER — Other Ambulatory Visit: Payer: Self-pay | Admitting: Nurse Practitioner

## 2021-04-24 ENCOUNTER — Other Ambulatory Visit: Payer: Self-pay | Admitting: Nurse Practitioner

## 2021-04-24 NOTE — Telephone Encounter (Signed)
Last OV 11/09/20 Next OV 05/14/21  Last filled 04/01/21

## 2021-05-14 ENCOUNTER — Ambulatory Visit (INDEPENDENT_AMBULATORY_CARE_PROVIDER_SITE_OTHER): Payer: Medicaid Other | Admitting: Nurse Practitioner

## 2021-05-14 ENCOUNTER — Encounter: Payer: Self-pay | Admitting: Nurse Practitioner

## 2021-05-14 VITALS — BP 129/91 | HR 65 | Temp 98.1°F | Resp 20 | Ht 61.0 in | Wt 215.0 lb

## 2021-05-14 DIAGNOSIS — Z87898 Personal history of other specified conditions: Secondary | ICD-10-CM

## 2021-05-14 DIAGNOSIS — G5691 Unspecified mononeuropathy of right upper limb: Secondary | ICD-10-CM

## 2021-05-14 DIAGNOSIS — E785 Hyperlipidemia, unspecified: Secondary | ICD-10-CM

## 2021-05-14 DIAGNOSIS — I471 Supraventricular tachycardia: Secondary | ICD-10-CM | POA: Diagnosis not present

## 2021-05-14 DIAGNOSIS — E876 Hypokalemia: Secondary | ICD-10-CM

## 2021-05-14 DIAGNOSIS — R569 Unspecified convulsions: Secondary | ICD-10-CM

## 2021-05-14 DIAGNOSIS — Z23 Encounter for immunization: Secondary | ICD-10-CM | POA: Diagnosis not present

## 2021-05-14 DIAGNOSIS — D61818 Other pancytopenia: Secondary | ICD-10-CM | POA: Diagnosis not present

## 2021-05-14 DIAGNOSIS — R609 Edema, unspecified: Secondary | ICD-10-CM

## 2021-05-14 DIAGNOSIS — G894 Chronic pain syndrome: Secondary | ICD-10-CM

## 2021-05-14 DIAGNOSIS — F5101 Primary insomnia: Secondary | ICD-10-CM

## 2021-05-14 DIAGNOSIS — F3341 Major depressive disorder, recurrent, in partial remission: Secondary | ICD-10-CM

## 2021-05-14 LAB — LIPID PANEL

## 2021-05-14 MED ORDER — ATORVASTATIN CALCIUM 40 MG PO TABS: 40.0000 mg | ORAL_TABLET | Freq: Every day | ORAL | 1 refills | Status: DC

## 2021-05-14 MED ORDER — FUROSEMIDE 40 MG PO TABS: 40.0000 mg | ORAL_TABLET | Freq: Every day | ORAL | 1 refills | Status: DC

## 2021-05-14 MED ORDER — CYCLOBENZAPRINE HCL 5 MG PO TABS: ORAL_TABLET | ORAL | 8 refills | Status: DC

## 2021-05-14 MED ORDER — TRAZODONE HCL 50 MG PO TABS: ORAL_TABLET | ORAL | 1 refills | Status: DC

## 2021-05-14 MED ORDER — LEVETIRACETAM 500 MG PO TABS: ORAL_TABLET | ORAL | 1 refills | Status: DC

## 2021-05-14 MED ORDER — ISOSORBIDE DINITRATE 10 MG PO TABS: 10.0000 mg | ORAL_TABLET | Freq: Two times a day (BID) | ORAL | 1 refills | Status: DC

## 2021-05-14 MED ORDER — DULOXETINE HCL 60 MG PO CPEP: 120.0000 mg | ORAL_CAPSULE | Freq: Every day | ORAL | 1 refills | Status: DC

## 2021-05-14 NOTE — Patient Instructions (Signed)
Influenza Virus Vaccine injection (Fluarix) What is this medication? INFLUENZA VIRUS VACCINE (in floo EN zuh VAHY ruhs vak SEEN) helps to reduce the risk of getting influenza also known as the flu. This medicine may be used for other purposes; ask your health care provider or pharmacist if you have questions. COMMON BRAND NAME(S): Fluarix, Fluzone What should I tell my care team before I take this medication? They need to know if you have any of these conditions: bleeding disorder like hemophilia fever or infection Guillain-Barre syndrome or other neurological problems immune system problems infection with the human immunodeficiency virus (HIV) or AIDS low blood platelet counts multiple sclerosis an unusual or allergic reaction to influenza virus vaccine, eggs, chicken proteins, latex, gentamicin, other medicines, foods, dyes or preservatives pregnant or trying to get pregnant breast-feeding How should I use this medication? This vaccine is for injection into a muscle. It is given by a health care professional. A copy of Vaccine Information Statements will be given before each vaccination. Read this sheet carefully each time. The sheet may change frequently. Talk to your pediatrician regarding the use of this medicine in children. Special care may be needed. Overdosage: If you think you have taken too much of this medicine contact a poison control center or emergency room at once. NOTE: This medicine is only for you. Do not share this medicine with others. What if I miss a dose? This does not apply. What may interact with this medication? chemotherapy or radiation therapy medicines that lower your immune system like etanercept, anakinra, infliximab, and adalimumab medicines that treat or prevent blood clots like warfarin phenytoin steroid medicines like prednisone or cortisone theophylline vaccines This list may not describe all possible interactions. Give your health care provider a  list of all the medicines, herbs, non-prescription drugs, or dietary supplements you use. Also tell them if you smoke, drink alcohol, or use illegal drugs. Some items may interact with your medicine. What should I watch for while using this medication? Report any side effects that do not go away within 3 days to your doctor or health care professional. Call your health care provider if any unusual symptoms occur within 6 weeks of receiving this vaccine. You may still catch the flu, but the illness is not usually as bad. You cannot get the flu from the vaccine. The vaccine will not protect against colds or other illnesses that may cause fever. The vaccine is needed every year. What side effects may I notice from receiving this medication? Side effects that you should report to your doctor or health care professional as soon as possible: allergic reactions like skin rash, itching or hives, swelling of the face, lips, or tongue Side effects that usually do not require medical attention (report to your doctor or health care professional if they continue or are bothersome): fever headache muscle aches and pains pain, tenderness, redness, or swelling at site where injected weak or tired This list may not describe all possible side effects. Call your doctor for medical advice about side effects. You may report side effects to FDA at 1-800-FDA-1088. Where should I keep my medication? This vaccine is only given in a clinic, pharmacy, doctor's office, or other health care setting and will not be stored at home. NOTE: This sheet is a summary. It may not cover all possible information. If you have questions about this medicine, talk to your doctor, pharmacist, or health care provider.  2022 Elsevier/Gold Standard (2007-10-27 09:30:40)

## 2021-05-14 NOTE — Progress Notes (Signed)
Subjective:    Patient ID: Angela Michael, female    DOB: [birth year], 61 y.o.   MRN: [id number]   Chief Complaint: Medical Management of Chronic Issues    HPI:  Angela Michael is a 61 y.o. who identifies as a female who was assigned female at birth.   Social history: Lives with: with her husbands and daughters Work history: disablitiy   Comes in today for follow up of the following chronic medical issues:  1. SVT (supraventricular tachycardia) ([place]) Denies any episodes of heart racing.  2. Neuropathy of right upper extremity See pain management  3. Hx of syncope No syncopial episodes  4. Pancytopenia ([place]) Lab Results  Component Value Date   WBC 5.8 11/09/2020   HGB 13.2 11/09/2020   HCT 38.5 11/09/2020   MCV 96 11/09/2020   PLT 181 11/09/2020     5. Recurrent major depressive disorder, in partial remission ([place]) Is currently on cymbalta which has been helping. Depression screen [facility] 2/9 05/14/2021 11/09/2020 05/14/2020  Decreased Interest 0 0 0  Down, Depressed, Hopeless 0 0 0  PHQ - 2 Score 0 0 0  Altered sleeping 0 0 0  Tired, decreased energy 1 0 1  Change in appetite 1 0 0  Feeling bad or failure about yourself  0 0 0  Trouble concentrating 0 0 0  Moving slowly or fidgety/restless 0 0 0  Suicidal thoughts 0 0 0  PHQ-9 Score 2 0 1  Difficult doing work/chores Not difficult at all Not difficult at all Not difficult at all  Some recent data might be hidden     6. Peripheral edema Has some swelling daily.  7. Hyperlipidemia with target LDL less than 100 Doe snot really watch diet and does no dedicated exercise. Lab Results  Component Value Date   CHOL 146 11/09/2020   HDL 48 11/09/2020   LDLCALC 78 11/09/2020   TRIG 113 11/09/2020   CHOLHDL 3.0 11/09/2020     8. Hypokalemia No c/o cramping Lab Results  Component Value Date   K 4.5 11/09/2020     9. Chronic pain syndrome See pain management and is pain all the time.  10.  insomnia Sleeps about 4-8 hours a night  11. Morbid obesity Wt Readings from Last 3 Encounters:  05/14/21 215 lb (97.5 kg)  11/29/20 213 lb 6.4 oz (96.8 kg)  11/09/20 207 lb (93.9 kg)   BMI Readings from Last 3 Encounters:  05/14/21 40.62 kg/m  11/29/20 40.32 kg/m  11/09/20 39.11 kg/m     New complaints: None today  Allergies  Allergen Reactions   Advicor [Niacin-Lovastatin Er] Rash    ALLERGY / INTOLERANCE   Niacin Rash   Outpatient Encounter Medications as of 05/14/2021  Medication Sig   acetaminophen (TYLENOL) 500 MG tablet Take 1,000 mg every 6 (six) hours as needed by mouth for moderate pain or headache.   aspirin 81 MG tablet Take 81 mg by mouth daily. Reported on 06/21/2015   atorvastatin (LIPITOR) 40 MG tablet Take 1 tablet (40 mg total) by mouth at bedtime.   cyanocobalamin (,VITAMIN B-12,) 1000 MCG/ML injection INJECT 1 ML (1,000 MCG) INTRAMUSCULARLY EVERY 30 DAYS   cyclobenzaprine (FLEXERIL) 5 MG tablet TAKE 1 TABLET BY MOUTH EVERYDAY AT BEDTIME   DULoxetine (CYMBALTA) 60 MG capsule Take 2 capsules (120 mg total) by mouth daily.   Fe Fum-FePoly-FA-Vit C-Vit B3 (FOLIVANE-F) 125-1 MG CAPS Take 1 capsule by mouth daily.   fluticasone (FLONASE) 50 MCG/ACT nasal spray PLACE  2 SPRAYS INTO THE NOSE AS NEEDED. FOR ALLERGIES   furosemide (LASIX) 40 MG tablet Take 1 tablet (40 mg total) by mouth daily.   isosorbide dinitrate (ISORDIL) 10 MG tablet Take 1 tablet (10 mg total) by mouth 2 (two) times daily.   levETIRAcetam (KEPPRA) 500 MG tablet TAKE 2 TABLETS BY MOUTH 2 TIMES DAILY   loratadine (CLARITIN) 10 MG tablet Take 10 mg daily as needed by mouth for allergies.    Multiple Vitamin (MULTIVITAMIN WITH MINERALS) TABS Take 1 tablet by mouth daily. Reported on 06/21/2015   nitroGLYCERIN (NITROSTAT) 0.4 MG SL tablet Place 1 tablet (0.4 mg total) under the tongue every 5 (five) minutes as needed for chest pain.   polyethylene glycol powder (GLYCOLAX/MIRALAX) powder Take 17 g by  mouth daily as needed. (Patient taking differently: Take 17 g by mouth daily as needed for moderate constipation.)   pregabalin (LYRICA) 75 MG capsule Take 1 capsule (57m) in the morning, 1 capsule (763m in the afternoon, and 2 capsules (15060mat night.   traZODone (DESYREL) 50 MG tablet TAKE 1 TO 3 TABLETS BY MOUTH AT BEDTIME   No facility-administered encounter medications on file as of 05/14/2021.    Past Surgical History:  Procedure Laterality Date   ABDOMINOPLASTY N/A 03/02/2017   Procedure: BACK LIPECTOMY;  Surgeon: ThiIrene LimboD;  [facility]  Laterality: N/A;   ABDOMINOPLASTY/PANNICULECTOMY N/A 03/02/2017   Procedure: ABDOMINOPLASTY/PANNICULECTOMY;  Surgeon: ThiIrene LimboD;  [facility];  Laterality: N/A;   BREAST SURGERY     " uplift"   CATARACT EXTRACTION Right    CATARACT EXTRACTION W/ INTRAOCULAR LENS  IMPLANT, BILATERAL     CHOLECYSTECTOMY     COLONOSCOPY     ENDOVENOUS ABLATION SAPHENOUS VEIN W/ LASER Right 09-08-2013   EVLA right small saphenous vein and stab phlebectomy 10-20 incisions right leg by TodCurt Jews   GASTRIC BYPASS  2007   Says she had lost 345 lbs   LYMPH NODE DISSECTION Left    Resected   lymph node removed from stomach     MEDIAL PARTIAL KNEE REPLACEMENT  05/10/14   left knee   MEDIAL PARTIAL KNEE REPLACEMENT Left 06/04/2015   [facility] SURGERY     Removal of excess skin   TOOTH EXTRACTION Right 02/20/2017   VENA CAVA FILTER PLACEMENT     VENOUS THROMBECTOMY     Blood clot resection from right arm    Family History  Problem Relation Age of Onset   Liver cancer Father    Diabetes Father    Kidney cancer Father    Heart failure Brother    Heart attack Brother    Tuberculosis Maternal Grandmother    Bone cancer Maternal Grandfather    Diabetes Paternal Uncle    Lung cancer Mother    Coronary artery disease Neg Hx    Sudden death Neg Hx    Cardiomyopathy Neg Hx     Esophageal cancer Neg Hx    Stomach cancer Neg Hx       Controlled substance contract: n/a     Review of Systems  Constitutional:  Negative for diaphoresis.  Eyes:  Negative for pain.  Respiratory:  Negative for shortness of breath.   Cardiovascular:  Negative for chest pain, palpitations and leg swelling.  Gastrointestinal:  Negative for abdominal pain.  Endocrine: Negative for polydipsia.  Skin:  Negative for rash.  Neurological:  Negative for dizziness, weakness and  headaches.  Hematological:  Does not bruise/bleed easily.  All other systems reviewed and are negative.     Objective:   Physical Exam Vitals and nursing note reviewed.  Constitutional:      General: She is not in acute distress.    Appearance: Normal appearance. She is well-developed.  HENT:     Head: Normocephalic.     Right Ear: Tympanic membrane normal.     Left Ear: Tympanic membrane normal.     Nose: Nose normal.     Mouth/Throat:     Mouth: Mucous membranes are moist.  Eyes:     Pupils: Pupils are equal, round, and reactive to light.  Neck:     Vascular: No carotid bruit or JVD.  Cardiovascular:     Rate and Rhythm: Normal rate and regular rhythm.     Heart sounds: Normal heart sounds.  Pulmonary:     Effort: Pulmonary effort is normal. No respiratory distress.     Breath sounds: Normal breath sounds. No wheezing or rales.  Chest:     Chest wall: No tenderness.  Abdominal:     General: Bowel sounds are normal. There is no distension or abdominal bruit.     Palpations: Abdomen is soft. There is no hepatomegaly, splenomegaly, mass or pulsatile mass.     Tenderness: There is no abdominal tenderness.     Comments: pendulous abdomen  Musculoskeletal:        General: Normal range of motion.     Cervical back: Normal range of motion and neck supple.  Lymphadenopathy:     Cervical: No cervical adenopathy.  Skin:    General: Skin is warm and dry.  Neurological:     Mental Status: She is alert  and oriented to person, place, and time.     Deep Tendon Reflexes: Reflexes are normal and symmetric.  Psychiatric:        Behavior: Behavior normal.        Thought Content: Thought content normal.        Judgment: Judgment normal.    BP (!) 129/91    Pulse 65    Temp 98.1 F (36.7 C) (Temporal)    Resp 20    Ht 5' 1"  (1.549 m)    Wt 215 lb (97.5 kg)    SpO2 94%    BMI 40.62 kg/m        Assessment & Plan:  Angela Michael comes in today with chief complaint of Medical Management of Chronic Issues   Diagnosis and orders addressed:  1. SVT (supraventricular tachycardia) (HCC) Avoid caffeine Report tachycardia  - isosorbide dinitrate (ISORDIL) 10 MG tablet; Take 1 tablet (10 mg total) by mouth 2 (two) times daily.  Dispense: 180 tablet; Refill: 1  2. Neuropathy of right upper extremity Moist heat when needed - cyclobenzaprine (FLEXERIL) 5 MG tablet; TAKE 1 TABLET BY MOUTH EVERYDAY AT BEDTIME  Dispense: 21 tablet; Refill: 8  3. Hx of syncope Report any syncopial episodes  4. Pancytopenia ([place]) Labs pending  5. Recurrent major depressive disorder, in partial remission (HCC) Stress maanegement - DULoxetine (CYMBALTA) 60 MG capsule; Take 2 capsules (120 mg total) by mouth daily.  Dispense: 180 capsule; Refill: 1  6. Peripheral edema Elevat legs when sitting - furosemide (LASIX) 40 MG tablet; Take 1 tablet (40 mg total) by mouth daily.  Dispense: 90 tablet; Refill: 1  7. Hyperlipidemia with target LDL less than 100 Low fat diet - atorvastatin (LIPITOR) 40 MG tablet; Take 1 tablet (  40 mg total) by mouth at bedtime.  Dispense: 90 tablet; Refill: 1 - CBC with Differential/Platelet - CMP14+EGFR - Lipid panel  8. Hypokalemia Labs pending  9. Chronic pain syndrome Continue with pain management  10. Morbid obesity ([place]) Discussed diet and exercise for person with BMI >25 Will recheck weight in 3-6 months   11. Primary insomnia Bedtime routine - traZODone (DESYREL) 50  MG tablet; TAKE 1 TO 3 TABLETS BY MOUTH AT BEDTIME  Dispense: 270 tablet; Refill: 1  12. Seizures ([place]) Report any seizure activity - levETIRAcetam (KEPPRA) 500 MG tablet; TAKE 2 TABLETS BY MOUTH 2 TIMES DAILY  Dispense: 360 tablet; Refill: 1   Labs pending Health Maintenance reviewed Diet and exercise encouraged  Follow up plan: 6 months   Mary-Margaret Hassell Done, FNP

## 2021-05-15 ENCOUNTER — Other Ambulatory Visit: Payer: Self-pay

## 2021-05-15 LAB — CBC WITH DIFFERENTIAL/PLATELET
Basophils Absolute: 0.1 10*3/uL (ref 0.0–0.2)
Basos: 1 %
EOS (ABSOLUTE): 0.2 10*3/uL (ref 0.0–0.4)
Eos: 3 %
Hematocrit: 39.6 % (ref 34.0–46.6)
Hemoglobin: 12.9 g/dL (ref 11.1–15.9)
Immature Grans (Abs): 0 10*3/uL (ref 0.0–0.1)
Immature Granulocytes: 0 %
Lymphocytes Absolute: 1.2 10*3/uL (ref 0.7–3.1)
Lymphs: 19 %
MCH: 30.7 pg (ref 26.6–33.0)
MCHC: 32.6 g/dL (ref 31.5–35.7)
MCV: 94 fL (ref 79–97)
Monocytes Absolute: 0.5 10*3/uL (ref 0.1–0.9)
Monocytes: 7 %
Neutrophils Absolute: 4.5 10*3/uL (ref 1.4–7.0)
Neutrophils: 70 %
Platelets: 174 10*3/uL (ref 150–450)
RBC: 4.2 x10E6/uL (ref 3.77–5.28)
RDW: 12.5 % (ref 11.7–15.4)
WBC: 6.4 10*3/uL (ref 3.4–10.8)

## 2021-05-15 LAB — CMP14+EGFR
ALT: 27 IU/L (ref 0–32)
AST: 29 IU/L (ref 0–40)
Albumin/Globulin Ratio: 2.1 (ref 1.2–2.2)
Albumin: 4.1 g/dL (ref 3.8–4.9)
Alkaline Phosphatase: 146 IU/L — ABNORMAL HIGH (ref 44–121)
BUN/Creatinine Ratio: 16 (ref 9–23)
BUN: 13 mg/dL (ref 6–24)
Bilirubin Total: 0.5 mg/dL (ref 0.0–1.2)
CO2: 25 mmol/L (ref 20–29)
Calcium: 9 mg/dL (ref 8.7–10.2)
Chloride: 102 mmol/L (ref 96–106)
Creatinine, Ser: 0.79 mg/dL (ref 0.57–1.00)
Globulin, Total: 2 g/dL (ref 1.5–4.5)
Glucose: 109 mg/dL — ABNORMAL HIGH (ref 70–99)
Potassium: 4.1 mmol/L (ref 3.5–5.2)
Sodium: 142 mmol/L (ref 134–144)
Total Protein: 6.1 g/dL (ref 6.0–8.5)
eGFR: 87 mL/min/{1.73_m2} (ref >59)

## 2021-05-15 LAB — LIPID PANEL
Chol/HDL Ratio: 3.6 ratio (ref 0.0–4.4)
Cholesterol, Total: 160 mg/dL (ref 100–199)
HDL: 45 mg/dL (ref >39)
LDL Chol Calc (NIH): 74 mg/dL (ref 0–99)
Triglycerides: 254 mg/dL — ABNORMAL HIGH (ref 0–149)
VLDL Cholesterol Cal: 41 mg/dL — ABNORMAL HIGH (ref 5–40)

## 2021-05-21 ENCOUNTER — Other Ambulatory Visit: Payer: Self-pay | Admitting: Nurse Practitioner

## 2021-11-08 ENCOUNTER — Ambulatory Visit: Payer: Medicaid Other | Admitting: Nurse Practitioner

## 2021-11-14 ENCOUNTER — Ambulatory Visit: Payer: Medicaid Other | Admitting: Nurse Practitioner

## 2021-11-14 ENCOUNTER — Encounter: Payer: Self-pay | Admitting: Nurse Practitioner

## 2021-11-14 ENCOUNTER — Other Ambulatory Visit: Payer: Self-pay | Admitting: Nurse Practitioner

## 2021-11-14 DIAGNOSIS — E785 Hyperlipidemia, unspecified: Secondary | ICD-10-CM
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
## 2021-11-15 NOTE — Patient Instructions (Signed)
Our records indicate that you are due for your annual mammogram/breast imaging. While there is no way to prevent breast cancer, early detection provides the best opportunity for curing it. For women over the age of 40, the American Cancer Society recommends a yearly clinical breast exam and a yearly mammogram. These practices have saved thousands of lives. We need your help to ensure that you are receiving optimal medical care. Please call the imaging location that has done you previous mammograms. Please remember to list us as your primary care. This helps make sure we receive a report and can update your chart.  Below is the contact information for several local breast imaging centers. You may call the location that works best for you, and they will be happy to assistance in making you an appointment. You do not need an order for a regular screening mammogram. However, if you are having any problems or concerns with you breast area, please let your primary care provider know, and appropriate orders will be placed. Please let our office know if you have any questions or concerns. Or if you need information for another imaging center not on this list or outside of the area. We are commented to working with you on your health care journey.   The mobile unit/bus ([facility]) - they come twice a month to our location.  These appointments can be made through our office or by call The Breast Center  [facility]  [address] [place], [place] [address] Phone [phone]  [facility] Radiology Department  [address]  [place], [place] [address] [phone]  [facility])  [address] [place], [place] [address] [phone]  [facility] [place]  [address] [place] [address] [phone]  [facility]  [address], [address] [place] [address] [phone]  [facility] [place]  [address] [place], [place] [address] [phone]  [facility] [address] [place], [place] [address] [phone]  [facility] [address]  [address] [place], [place] [address] [phone]  [facility] [address] [place] [address] [phone]     

## 2021-11-19 ENCOUNTER — Encounter: Payer: Self-pay | Admitting: Nurse Practitioner

## 2021-11-19 ENCOUNTER — Ambulatory Visit (INDEPENDENT_AMBULATORY_CARE_PROVIDER_SITE_OTHER): Payer: Medicaid Other | Admitting: Nurse Practitioner

## 2021-11-19 VITALS — BP 123/87 | HR 76 | Temp 98.2°F | Resp 20 | Ht 61.0 in | Wt 197.0 lb

## 2021-11-19 DIAGNOSIS — I471 Supraventricular tachycardia: Secondary | ICD-10-CM | POA: Diagnosis not present

## 2021-11-19 DIAGNOSIS — F3341 Major depressive disorder, recurrent, in partial remission: Secondary | ICD-10-CM

## 2021-11-19 DIAGNOSIS — E785 Hyperlipidemia, unspecified: Secondary | ICD-10-CM | POA: Diagnosis not present

## 2021-11-19 DIAGNOSIS — E876 Hypokalemia: Secondary | ICD-10-CM

## 2021-11-19 DIAGNOSIS — D61818 Other pancytopenia: Secondary | ICD-10-CM

## 2021-11-19 DIAGNOSIS — R569 Unspecified convulsions: Secondary | ICD-10-CM

## 2021-11-19 DIAGNOSIS — G4700 Insomnia, unspecified: Secondary | ICD-10-CM

## 2021-11-19 DIAGNOSIS — R609 Edema, unspecified: Secondary | ICD-10-CM

## 2021-11-19 DIAGNOSIS — R82998 Other abnormal findings in urine: Secondary | ICD-10-CM

## 2021-11-19 DIAGNOSIS — F5101 Primary insomnia: Secondary | ICD-10-CM

## 2021-11-19 DIAGNOSIS — I8393 Asymptomatic varicose veins of bilateral lower extremities: Secondary | ICD-10-CM | POA: Diagnosis not present

## 2021-11-19 DIAGNOSIS — C8590 Non-Hodgkin lymphoma, unspecified, unspecified site: Secondary | ICD-10-CM

## 2021-11-19 LAB — URINALYSIS, COMPLETE
Bilirubin, UA: NEGATIVE
Glucose, UA: NEGATIVE
Ketones, UA: NEGATIVE
Nitrite, UA: NEGATIVE
Protein,UA: NEGATIVE
Specific Gravity, UA: 1.01 (ref 1.005–1.030)
Urobilinogen, Ur: 0.2 mg/dL (ref 0.2–1.0)
pH, UA: 5.5 (ref 5.0–7.5)

## 2021-11-19 LAB — MICROSCOPIC EXAMINATION: Renal Epithel, UA: NONE SEEN /hpf

## 2021-11-19 MED ORDER — ISOSORBIDE DINITRATE 10 MG PO TABS: 10.0000 mg | ORAL_TABLET | Freq: Two times a day (BID) | ORAL | 1 refills | Status: DC

## 2021-11-19 MED ORDER — TRAZODONE HCL 50 MG PO TABS: ORAL_TABLET | ORAL | 1 refills | Status: DC

## 2021-11-19 MED ORDER — FUROSEMIDE 40 MG PO TABS: 40.0000 mg | ORAL_TABLET | Freq: Every day | ORAL | 1 refills | Status: DC

## 2021-11-19 MED ORDER — DULOXETINE HCL 60 MG PO CPEP: 120.0000 mg | ORAL_CAPSULE | Freq: Every day | ORAL | 1 refills | Status: DC

## 2021-11-19 MED ORDER — ATORVASTATIN CALCIUM 40 MG PO TABS: ORAL_TABLET | ORAL | 1 refills | Status: DC

## 2021-11-19 MED ORDER — LEVETIRACETAM 500 MG PO TABS: ORAL_TABLET | ORAL | 1 refills | Status: DC

## 2021-11-19 NOTE — Addendum Note (Signed)
Addended by: Rolena Infante on: 11/19/2021 10:55 AM   Modules accepted: Orders

## 2021-11-19 NOTE — Progress Notes (Signed)
Subjective:    Patient ID: Angela Michael, female    DOB: [birth year], 61 y.o.   MRN: [id number]   Chief Complaint: medical management of chronic issues     HPI:  Angela Michael is a 61 y.o. who identifies as a female who was assigned female at birth.   Social history: Lives with: husband- her daughters and their partners also live with her with her grandchildren Work history: disability   Comes in today for follow up of the following chronic medical issues:  1. SVT (supraventricular tachycardia) ([place]) Is on isordil. Denies any palpitations.  2. Asymptomatic varicose veins of both lower extremities Has rope like varicose veins of bil lower ext. They make her legs feel heavy.  3. Pancytopenia ([place]) Lab Results  Component Value Date   WBC 6.4 05/14/2021   HGB 12.9 05/14/2021   HCT 39.6 05/14/2021   MCV 94 05/14/2021   PLT 174 05/14/2021     4. Hyperlipidemia with target LDL less than 100 Does not really wathc diet and does no dedicated exercise. Lab Results  Component Value Date   CHOL 160 05/14/2021   HDL 45 05/14/2021   LDLCALC 74 05/14/2021   TRIG 254 (H) 05/14/2021   CHOLHDL 3.6 05/14/2021     5. Hypokalemia No c/o muscle cramps. Lab Results  Component Value Date   K 4.1 05/14/2021     6. Peripheral edema Has daily lower ext edema  7. Seizures ([place]) She is on keppra. No recent seizure activity  8. Non-Hodgkin's lymphoma, unspecified body region, unspecified non-Hodgkin lymphoma type ([place]) Maryjane Hurter saw oncology on 10/22/21. She completed treatments in 2016. She is due for ct scan in septmeber and oncology will rder that.  9. Insomnia, unspecified type Is on trazadone nightly . Sleeps about 6-8 hours a night.  10. Recurrent major depressive disorder, in partial remission ([place]) She is on cymbalta and is doing ok. She has good days and bad days. Is under a lot more stress since her husband had to hospitalized with bad burns to leg. He is now in  rehab.  11. Morbid obesity ([place]) Weight is down 18lbs Wt Readings from Last 3 Encounters:  11/19/21 197 lb (89.4 kg)  05/14/21 215 lb (97.5 kg)  11/29/20 213 lb 6.4 oz (96.8 kg)   BMI Readings from Last 3 Encounters:  11/19/21 37.22 kg/m  05/14/21 40.62 kg/m  11/29/20 40.32 kg/m      New complaints: None today  Allergies  Allergen Reactions   Advicor [Niacin-Lovastatin Er] Rash    ALLERGY / INTOLERANCE   Niacin Rash   Outpatient Encounter Medications as of 11/19/2021  Medication Sig   acetaminophen (TYLENOL) 500 MG tablet Take 1,000 mg every 6 (six) hours as needed by mouth for moderate pain or headache.   aspirin 81 MG tablet Take 81 mg by mouth daily. Reported on 06/21/2015   atorvastatin (LIPITOR) 40 MG tablet TAKE 1 TABLET BY MOUTH EVERYDAY AT BEDTIME   cyanocobalamin (,VITAMIN B-12,) 1000 MCG/ML injection INJECT 1 ML (1,000 MCG) INTRAMUSCULARLY EVERY 30 DAYS   cyclobenzaprine (FLEXERIL) 5 MG tablet TAKE 1 TABLET BY MOUTH EVERYDAY AT BEDTIME   DULoxetine (CYMBALTA) 60 MG capsule Take 2 capsules (120 mg total) by mouth daily.   Fe Fum-FePoly-FA-Vit C-Vit B3 (FOLIVANE-F) 125-1 MG CAPS Take 1 capsule by mouth daily.   fluticasone (FLONASE) 50 MCG/ACT nasal spray PLACE 2 SPRAYS INTO THE NOSE AS NEEDED. FOR ALLERGIES   furosemide (LASIX) 40 MG tablet Take 1 tablet (40  mg total) by mouth daily.   isosorbide dinitrate (ISORDIL) 10 MG tablet Take 1 tablet (10 mg total) by mouth 2 (two) times daily.   levETIRAcetam (KEPPRA) 500 MG tablet TAKE 2 TABLETS BY MOUTH 2 TIMES DAILY   loratadine (CLARITIN) 10 MG tablet Take 10 mg daily as needed by mouth for allergies.    Multiple Vitamin (MULTIVITAMIN WITH MINERALS) TABS Take 1 tablet by mouth daily. Reported on 06/21/2015   nitroGLYCERIN (NITROSTAT) 0.4 MG SL tablet Place 1 tablet (0.4 mg total) under the tongue every 5 (five) minutes as needed for chest pain.   polyethylene glycol powder (GLYCOLAX/MIRALAX) powder Take 17 g by mouth daily  as needed. (Patient taking differently: Take 17 g by mouth daily as needed for moderate constipation.)   pregabalin (LYRICA) 75 MG capsule Take 1 capsule ('75mg'$ ) in the morning, 1 capsule ('75mg'$ ) in the afternoon, and 2 capsules ('150mg'$ ) at night.   traZODone (DESYREL) 50 MG tablet TAKE 1 TO 3 TABLETS BY MOUTH AT BEDTIME   No facility-administered encounter medications on file as of 11/19/2021.    Past Surgical History:  Procedure Laterality Date   ABDOMINOPLASTY N/A 03/02/2017   Procedure: BACK LIPECTOMY;  Surgeon: Irene Limbo, MD;  Location: [facility];  Service: Plastics;  Laterality: N/A;   ABDOMINOPLASTY/PANNICULECTOMY N/A 03/02/2017   Procedure: ABDOMINOPLASTY/PANNICULECTOMY;  Surgeon: Irene Limbo, MD;  Location: [facility];  Service: Plastics;  Laterality: N/A;   BREAST SURGERY     " uplift"   CATARACT EXTRACTION Right    CATARACT EXTRACTION W/ INTRAOCULAR LENS  IMPLANT, BILATERAL     CHOLECYSTECTOMY     COLONOSCOPY     ENDOVENOUS ABLATION SAPHENOUS VEIN W/ LASER Right 09-08-2013   EVLA right small saphenous vein and stab phlebectomy 10-20 incisions right leg by Curt Jews MD   GASTRIC BYPASS  2007   Says she had lost 345 lbs   LYMPH NODE DISSECTION Left    Resected   lymph node removed from stomach     MEDIAL PARTIAL KNEE REPLACEMENT  05/10/14   left knee   MEDIAL PARTIAL KNEE REPLACEMENT Left 06/04/2015   [facility]   SKIN SURGERY     Removal of excess skin   TOOTH EXTRACTION Right 02/20/2017   VENA CAVA FILTER PLACEMENT     VENOUS THROMBECTOMY     Blood clot resection from right arm    Family History  Problem Relation Age of Onset   Liver cancer Father    Diabetes Father    Kidney cancer Father    Heart failure Brother    Heart attack Brother    Tuberculosis Maternal Grandmother    Bone cancer Maternal Grandfather    Diabetes Paternal Uncle    Lung cancer Mother    Coronary artery disease Neg Hx    Sudden death Neg Hx    Cardiomyopathy Neg Hx    Esophageal cancer  Neg Hx    Stomach cancer Neg Hx       Controlled substance contract: n/a     Review of Systems  Constitutional:  Negative for diaphoresis.  Eyes:  Negative for pain.  Respiratory:  Negative for shortness of breath.   Cardiovascular:  Negative for chest pain, palpitations and leg swelling.  Gastrointestinal:  Negative for abdominal pain.  Endocrine: Negative for polydipsia.  Skin:  Negative for rash.  Neurological:  Negative for dizziness, weakness and headaches.  Hematological:  Does not bruise/bleed easily.  All other systems reviewed and are negative.  Objective:   Physical Exam Vitals and nursing note reviewed.  Constitutional:      General: She is not in acute distress.    Appearance: Normal appearance. She is well-developed.  HENT:     Head: Normocephalic.     Right Ear: Tympanic membrane normal.     Left Ear: Tympanic membrane normal.     Nose: Nose normal.     Mouth/Throat:     Mouth: Mucous membranes are moist.  Eyes:     Pupils: Pupils are equal, round, and reactive to light.  Neck:     Vascular: No carotid bruit or JVD.  Cardiovascular:     Rate and Rhythm: Normal rate and regular rhythm.     Heart sounds: Normal heart sounds.  Pulmonary:     Effort: Pulmonary effort is normal. No respiratory distress.     Breath sounds: Normal breath sounds. No wheezing or rales.  Chest:     Chest wall: No tenderness.  Abdominal:     General: Bowel sounds are normal. There is no distension or abdominal bruit.     Palpations: Abdomen is soft. There is no hepatomegaly, splenomegaly, mass or pulsatile mass.     Tenderness: There is no abdominal tenderness.  Musculoskeletal:        General: Normal range of motion.     Cervical back: Normal range of motion and neck supple.  Lymphadenopathy:     Cervical: No cervical adenopathy.  Skin:    General: Skin is warm and dry.  Neurological:     Mental Status: She is alert and oriented to person, place, and time.      Deep Tendon Reflexes: Reflexes are normal and symmetric.  Psychiatric:        Behavior: Behavior normal.        Thought Content: Thought content normal.        Judgment: Judgment normal.     BP 123/87   Pulse 76   Temp 98.2 F (36.8 C) (Temporal)   Resp 20   Ht '5\' 1"'$  (1.549 m)   Wt 197 lb (89.4 kg)   SpO2 92%   BMI 37.22 kg/m        Assessment & Plan:  Angela Michael comes in today with chief complaint of Medical Management of Chronic Issues   Diagnosis and orders addressed:  1. SVT (supraventricular tachycardia) (HCC) Avoid caffeine - isosorbide dinitrate (ISORDIL) 10 MG tablet; Take 1 tablet (10 mg total) by mouth 2 (two) times daily.  Dispense: 180 tablet; Refill: 1  2. Asymptomatic varicose veins of both lower extremities Wear compression hose when on feet alot  3. Pancytopenia ([place]) Labs pending  4. Hyperlipidemia with target LDL less than 100 Low fat diet - atorvastatin (LIPITOR) 40 MG tablet; TAKE 1 TABLET BY MOUTH EVERYDAY AT BEDTIME  Dispense: 90 tablet; Refill: 1  5. Hypokalemia Labs pending  6. Peripheral edema Elevate legs when sitting - furosemide (LASIX) 40 MG tablet; Take 1 tablet (40 mg total) by mouth daily.  Dispense: 90 tablet; Refill: 1  7. Seizures ([place]) Keep follow up with neurology - levETIRAcetam (KEPPRA) 500 MG tablet; TAKE 2 TABLETS BY MOUTH 2 TIMES DAILY  Dispense: 360 tablet; Refill: 1  8. Non-Hodgkin's lymphoma, unspecified body region, unspecified non-Hodgkin lymphoma type [facility]) Keep follow up with oncology  9. Insomnia, unspecified type Bedtime routine  10. Recurrent major depressive disorder, in partial remission (HCC) Stress management - DULoxetine (CYMBALTA) 60 MG capsule; Take 2 capsules (120 mg total) by  mouth daily.  Dispense: 180 capsule; Refill: 1  11. Morbid obesity ([place]) Discussed diet and exercise for person with BMI >25 Will recheck weight in 3-6 months  12. Dark urine - Urinalysis, Complete  13.  Primary insomnia Bedtime routine - traZODone (DESYREL) 50 MG tablet; TAKE 1 TO 3 TABLETS BY MOUTH AT BEDTIME  Dispense: 270 tablet; Refill: 1   Labs pending Health Maintenance reviewed Diet and exercise encouraged  Follow up plan: 6 months   Angela Hassell Done, FNP

## 2021-11-20 LAB — LIPID PANEL
Chol/HDL Ratio: 3.3 ratio (ref 0.0–4.4)
Cholesterol, Total: 203 mg/dL — ABNORMAL HIGH (ref 100–199)
HDL: 62 mg/dL (ref >39)
LDL Chol Calc (NIH): 109 mg/dL — ABNORMAL HIGH (ref 0–99)
Triglycerides: 189 mg/dL — ABNORMAL HIGH (ref 0–149)
VLDL Cholesterol Cal: 32 mg/dL (ref 5–40)

## 2021-11-20 LAB — CMP14+EGFR
ALT: 30 IU/L (ref 0–32)
AST: 25 IU/L (ref 0–40)
Albumin/Globulin Ratio: 1.7 (ref 1.2–2.2)
Albumin: 4.7 g/dL (ref 3.8–4.9)
Alkaline Phosphatase: 199 IU/L — ABNORMAL HIGH (ref 44–121)
BUN/Creatinine Ratio: 16 (ref 9–23)
BUN: 14 mg/dL (ref 6–24)
Bilirubin Total: 1 mg/dL (ref 0.0–1.2)
CO2: 24 mmol/L (ref 20–29)
Calcium: 9.9 mg/dL (ref 8.7–10.2)
Chloride: 95 mmol/L — ABNORMAL LOW (ref 96–106)
Creatinine, Ser: 0.87 mg/dL (ref 0.57–1.00)
Globulin, Total: 2.8 g/dL (ref 1.5–4.5)
Glucose: 127 mg/dL — ABNORMAL HIGH (ref 70–99)
Potassium: 4.1 mmol/L (ref 3.5–5.2)
Sodium: 139 mmol/L (ref 134–144)
Total Protein: 7.5 g/dL (ref 6.0–8.5)
eGFR: 77 mL/min/{1.73_m2} (ref >59)

## 2021-11-20 LAB — CBC WITH DIFFERENTIAL/PLATELET
Basophils Absolute: 0.1 10*3/uL (ref 0.0–0.2)
Basos: 1 %
EOS (ABSOLUTE): 0.1 10*3/uL (ref 0.0–0.4)
Eos: 2 %
Hematocrit: 44.9 % (ref 34.0–46.6)
Hemoglobin: 15.2 g/dL (ref 11.1–15.9)
Immature Grans (Abs): 0 10*3/uL (ref 0.0–0.1)
Immature Granulocytes: 0 %
Lymphocytes Absolute: 1.5 10*3/uL (ref 0.7–3.1)
Lymphs: 24 %
MCH: 31.4 pg (ref 26.6–33.0)
MCHC: 33.9 g/dL (ref 31.5–35.7)
MCV: 93 fL (ref 79–97)
Monocytes Absolute: 0.4 10*3/uL (ref 0.1–0.9)
Monocytes: 7 %
Neutrophils Absolute: 4.2 10*3/uL (ref 1.4–7.0)
Neutrophils: 66 %
Platelets: 167 10*3/uL (ref 150–450)
RBC: 4.84 x10E6/uL (ref 3.77–5.28)
RDW: 13 % (ref 11.7–15.4)
WBC: 6.3 10*3/uL (ref 3.4–10.8)

## 2022-01-03 ENCOUNTER — Other Ambulatory Visit: Payer: Self-pay | Admitting: Nurse Practitioner

## 2022-01-03 DIAGNOSIS — G5691 Unspecified mononeuropathy of right upper limb: Secondary | ICD-10-CM

## 2022-01-06 NOTE — Telephone Encounter (Signed)
Last office visit 11/19/21 Last refill 05/14/21, #21, 8 refills

## 2022-01-16 LAB — HM MAMMOGRAPHY

## 2022-04-17 ENCOUNTER — Telehealth: Payer: Self-pay | Admitting: Nurse Practitioner

## 2022-04-17 NOTE — Telephone Encounter (Signed)
Will not update pharmacy information, this must come from the patient.

## 2022-04-21 ENCOUNTER — Other Ambulatory Visit: Payer: Self-pay | Admitting: Nurse Practitioner

## 2022-04-22 NOTE — Telephone Encounter (Signed)
Pharmacy calling again for med refills for:  cyanocobalamin (,VITAMIN B-12,) 1000 MCG/ML injection atorvastatin (LIPITOR) 40 MG tablet  cyclobenzaprine (FLEXERIL) 5 MG tablet  DULoxetine (CYMBALTA) 30 and 60 MG capsule  furosemide (LASIX) 40 MG tablet  isosorbide dinitrate (ISORDIL) 10 MG tablet  pregabalin (LYRICA) 75 MG capsule  traZODone (DESYREL) 50 MG tablet   I left VM for pt to confirm that she is [facility] and that [facility] is no longer using [facility].   I let rep know that once we get confirmation from the pt that nurse will address.

## 2022-04-24 ENCOUNTER — Other Ambulatory Visit: Payer: Self-pay

## 2022-04-24 ENCOUNTER — Telehealth: Payer: Self-pay | Admitting: Nurse Practitioner

## 2022-04-24 DIAGNOSIS — E785 Hyperlipidemia, unspecified: Secondary | ICD-10-CM

## 2022-04-24 MED ORDER — ATORVASTATIN CALCIUM 40 MG PO TABS: ORAL_TABLET | ORAL | 1 refills | Status: DC

## 2022-04-24 NOTE — Telephone Encounter (Signed)
Sent atorvastatin rx to [facility] per patients request. We do not prescribe patients pain medication. She will need to contact pain management with this information

## 2022-04-24 NOTE — Telephone Encounter (Signed)
Someone from [facility] called requesting that we send them pts Atorvastatin and Tramadol Rx's. Did not see this pharmacy listed in pts chart so I called pt and left message for her to call back and let us know if this is something she is requesting.

## 2022-04-24 NOTE — Telephone Encounter (Signed)
Pt r/c, states that she does use [facility] she switched over to them and would like prescriptions to be sent to [facility]

## 2022-04-25 ENCOUNTER — Other Ambulatory Visit: Payer: Self-pay

## 2022-04-25 DIAGNOSIS — F5101 Primary insomnia: Secondary | ICD-10-CM

## 2022-04-25 MED ORDER — TRAZODONE HCL 50 MG PO TABS: ORAL_TABLET | ORAL | 1 refills | Status: DC

## 2022-04-25 NOTE — Telephone Encounter (Signed)
Trazadone Rx not Tramadol Rx.Marland Kitchen  Needs Trazadone Rx sent to [facility].

## 2022-04-25 NOTE — Telephone Encounter (Signed)
Sent trazadone to [facility] per patients request

## 2022-05-02 NOTE — Telephone Encounter (Addendum)
TC to pt, she will be using [facility], she was going to wait till her appt on 05/22/22 to tell her PCP. She does need her Atorvastatin at this time, PCP sent refill to [facility] on 04/24/22

## 2022-05-06 ENCOUNTER — Other Ambulatory Visit: Payer: Self-pay | Admitting: Nurse Practitioner

## 2022-05-09 ENCOUNTER — Telehealth: Payer: Self-pay | Admitting: Internal Medicine

## 2022-05-09 NOTE — Telephone Encounter (Signed)
FYI.  °Contacted patient regarding recall appointment, patient notified our office they did not wish to keep this appointment at this time.  Deleted recall from system. °

## 2022-05-14 ENCOUNTER — Encounter: Payer: Self-pay | Admitting: Nurse Practitioner

## 2022-05-21 ENCOUNTER — Other Ambulatory Visit: Payer: Self-pay | Admitting: Nurse Practitioner

## 2022-05-22 ENCOUNTER — Encounter: Payer: Self-pay | Admitting: Nurse Practitioner

## 2022-05-22 ENCOUNTER — Ambulatory Visit (INDEPENDENT_AMBULATORY_CARE_PROVIDER_SITE_OTHER): Payer: Commercial Managed Care - HMO | Admitting: Nurse Practitioner

## 2022-05-22 VITALS — BP 132/85 | HR 88 | Temp 97.6°F | Resp 20 | Ht 61.0 in | Wt 195.0 lb

## 2022-05-22 DIAGNOSIS — E785 Hyperlipidemia, unspecified: Secondary | ICD-10-CM

## 2022-05-22 DIAGNOSIS — R609 Edema, unspecified: Secondary | ICD-10-CM

## 2022-05-22 DIAGNOSIS — G4700 Insomnia, unspecified: Secondary | ICD-10-CM

## 2022-05-22 DIAGNOSIS — Z23 Encounter for immunization: Secondary | ICD-10-CM

## 2022-05-22 DIAGNOSIS — I471 Supraventricular tachycardia, unspecified: Secondary | ICD-10-CM

## 2022-05-22 DIAGNOSIS — I8393 Asymptomatic varicose veins of bilateral lower extremities: Secondary | ICD-10-CM

## 2022-05-22 DIAGNOSIS — E876 Hypokalemia: Secondary | ICD-10-CM

## 2022-05-22 DIAGNOSIS — F3341 Major depressive disorder, recurrent, in partial remission: Secondary | ICD-10-CM

## 2022-05-22 DIAGNOSIS — D61818 Other pancytopenia: Secondary | ICD-10-CM

## 2022-05-22 DIAGNOSIS — R600 Localized edema: Secondary | ICD-10-CM

## 2022-05-22 DIAGNOSIS — C8590 Non-Hodgkin lymphoma, unspecified, unspecified site: Secondary | ICD-10-CM

## 2022-05-22 DIAGNOSIS — R6510 Systemic inflammatory response syndrome (SIRS) of non-infectious origin without acute organ dysfunction: Secondary | ICD-10-CM

## 2022-05-22 DIAGNOSIS — G5691 Unspecified mononeuropathy of right upper limb: Secondary | ICD-10-CM

## 2022-05-22 DIAGNOSIS — R569 Unspecified convulsions: Secondary | ICD-10-CM

## 2022-05-22 DIAGNOSIS — G894 Chronic pain syndrome: Secondary | ICD-10-CM

## 2022-05-22 MED ORDER — FUROSEMIDE 40 MG PO TABS: 40.0000 mg | ORAL_TABLET | Freq: Every day | ORAL | 1 refills | Status: DC

## 2022-05-22 MED ORDER — DULOXETINE HCL 60 MG PO CPEP: 120.0000 mg | ORAL_CAPSULE | Freq: Every day | ORAL | 1 refills | Status: DC

## 2022-05-22 MED ORDER — ISOSORBIDE DINITRATE 10 MG PO TABS: 10.0000 mg | ORAL_TABLET | Freq: Two times a day (BID) | ORAL | 1 refills | Status: DC

## 2022-05-22 MED ORDER — LEVETIRACETAM 500 MG PO TABS: ORAL_TABLET | ORAL | 1 refills | Status: DC

## 2022-05-22 NOTE — Addendum Note (Signed)
Addended by: Rolena Infante on: 05/22/2022 12:44 PM   Modules accepted: Orders

## 2022-05-22 NOTE — Progress Notes (Signed)
Subjective:    Patient ID: Angela Michael, female    DOB: 01-Jun-1960, 62 y.o.   MRN: [id number]   Chief Complaint: Medical Management of Chronic Issues    HPI:  Angela Michael is a 62 y.o. who identifies as a female who was assigned female at birth.   Social history: Lives with: daughters and their families Work history: disability   Comes in today for follow up of the following chronic medical issues:  1. Hyperlipidemia with target LDL less than 100 Does not watch diet and does no dedicated exercise Lab Results  Component Value Date   CHOL 203 (H) 11/19/2021   HDL 62 11/19/2021   LDLCALC 109 (H) 11/19/2021   TRIG 189 (H) 11/19/2021   CHOLHDL 3.3 11/19/2021     2. SVT (supraventricular tachycardia) No recent palpitations or heart racing. Is still or isordil.  3. Asymptomatic varicose veins of both lower extremities Do not really bother her  4. Hypokalemia Denies muscle cramps Lab Results  Component Value Date   K 4.1 11/19/2021     5. Seizures ([place]) Is on keppra and reports no seizure activity. Has not seen  neurologyin awhile.  6. Peripheral edema Has edema of lower ext daily  7. Non-Hodgkin's lymphoma, unspecified body region, unspecified non-Hodgkin lymphoma type [facility]) Last saw  oncology on 02/18/22. Treatment ended in 2018. At last visit their was no reoccurence.  8. Insomnia, unspecified type Is on trazadone to sleep and sleeps about 7-8hours a night.  9. Neuropathy of right upper extremity Takes lyrica daily. Has constant burning in her feet.  10. Pancytopenia ([place]) Lab Results  Component Value Date   WBC 6.3 11/19/2021   HGB 15.2 11/19/2021   HCT 44.9 11/19/2021   MCV 93 11/19/2021   PLT 167 11/19/2021     11. Chronic pain syndrome See pain management  12. SIRS (systemic inflammatory response syndrome) (HCC) No complaints today  13. Recurrent major depressive disorder, in partial remission ([place]) Her husband passed away 6 months  ago and her aunt died several weeks ago. She is having a hard time. She takes her cymbalta daily and that helps her alot    05/22/2022   11:57 AM 11/19/2021   10:14 AM 05/14/2021   11:41 AM  Depression screen PHQ 2/9  Decreased Interest 1 1 0  Down, Depressed, Hopeless 1 1 0  PHQ - 2 Score 2 2 0  Altered sleeping 0 2 0  Tired, decreased energy '1 1 1  '$ Change in appetite 1 0 1  Feeling bad or failure about yourself  0 0 0  Trouble concentrating 1 0 0  Moving slowly or fidgety/restless 0 0 0  Suicidal thoughts 0 0 0  PHQ-9 Score '5 5 2  '$ Difficult doing work/chores Somewhat difficult Somewhat difficult Not difficult at all     14. Morbid obesity ([place]) Wt Readings from Last 3 Encounters:  05/22/22 195 lb (88.5 kg)  11/19/21 197 lb (89.4 kg)  05/14/21 215 lb (97.5 kg)   BMI Readings from Last 3 Encounters:  05/22/22 36.84 kg/m  11/19/21 37.22 kg/m  05/14/21 40.62 kg/m      New complaints: None  today  Allergies  Allergen Reactions   Advicor [Niacin-Lovastatin Er] Rash    ALLERGY / INTOLERANCE   Niacin Rash   Outpatient Encounter Medications as of 05/22/2022  Medication Sig   acetaminophen (TYLENOL) 500 MG tablet Take 1,000 mg every 6 (six) hours as needed by mouth for moderate pain or headache.  aspirin 81 MG tablet Take 81 mg by mouth daily. Reported on 06/21/2015   atorvastatin (LIPITOR) 40 MG tablet TAKE 1 TABLET BY MOUTH EVERYDAY AT BEDTIME   BUTRANS 20 MCG/HR PTWK 1 patch once a week.   cyanocobalamin (VITAMIN B12) 1000 MCG/ML injection INJECT 1ML INTRAMUSCULARLY EVERY 30 DAYS   cyclobenzaprine (FLEXERIL) 5 MG tablet TAKE 1 TABLET BY MOUTH EVERYDAY AT BEDTIME   DULoxetine (CYMBALTA) 60 MG capsule Take 2 capsules (120 mg total) by mouth daily.   Fe Fum-FePoly-FA-Vit C-Vit B3 (FOLIVANE-F) 125-1 MG CAPS Take 1 capsule by mouth daily. (Patient not taking: Reported on 11/19/2021)   fluticasone (FLONASE) 50 MCG/ACT nasal spray PLACE 2 SPRAYS INTO THE NOSE AS NEEDED. FOR  ALLERGIES   furosemide (LASIX) 40 MG tablet Take 1 tablet (40 mg total) by mouth daily.   isosorbide dinitrate (ISORDIL) 10 MG tablet Take 1 tablet (10 mg total) by mouth 2 (two) times daily.   levETIRAcetam (KEPPRA) 500 MG tablet TAKE 2 TABLETS BY MOUTH 2 TIMES DAILY   loratadine (CLARITIN) 10 MG tablet Take 10 mg daily as needed by mouth for allergies.    Multiple Vitamin (MULTIVITAMIN WITH MINERALS) TABS Take 1 tablet by mouth daily. Reported on 06/21/2015   nitroGLYCERIN (NITROSTAT) 0.4 MG SL tablet Place 1 tablet (0.4 mg total) under the tongue every 5 (five) minutes as needed for chest pain.   polyethylene glycol powder (GLYCOLAX/MIRALAX) powder Take 17 g by mouth daily as needed. (Patient taking differently: Take 17 g by mouth daily as needed for moderate constipation.)   pregabalin (LYRICA) 75 MG capsule Take 1 capsule ('75mg'$ ) in the morning, 1 capsule ('75mg'$ ) in the afternoon, and 2 capsules ('150mg'$ ) at night.   traZODone (DESYREL) 50 MG tablet TAKE 1 TO 3 TABLETS BY MOUTH AT BEDTIME   No facility-administered encounter medications on file as of 05/22/2022.    Past Surgical History:  Procedure Laterality Date   ABDOMINOPLASTY N/A 03/02/2017   Procedure: BACK LIPECTOMY;  Surgeon: Irene Limbo, MD;  Location: [facility];  Service: Plastics;  Laterality: N/A;   ABDOMINOPLASTY/PANNICULECTOMY N/A 03/02/2017   Procedure: ABDOMINOPLASTY/PANNICULECTOMY;  Surgeon: Irene Limbo, MD;  Location: [facility];  Service: Plastics;  Laterality: N/A;   BREAST SURGERY     " uplift"   CATARACT EXTRACTION Right    CATARACT EXTRACTION W/ INTRAOCULAR LENS  IMPLANT, BILATERAL     CHOLECYSTECTOMY     COLONOSCOPY     ENDOVENOUS ABLATION SAPHENOUS VEIN W/ LASER Right 09-08-2013   EVLA right small saphenous vein and stab phlebectomy 10-20 incisions right leg by Curt Jews MD   GASTRIC BYPASS  2007   Says she had lost 345 lbs   LYMPH NODE DISSECTION Left    Resected   lymph node removed from stomach     MEDIAL  PARTIAL KNEE REPLACEMENT  05/10/14   left knee   MEDIAL PARTIAL KNEE REPLACEMENT Left 06/04/2015   [facility]   SKIN SURGERY     Removal of excess skin   TOOTH EXTRACTION Right 02/20/2017   VENA CAVA FILTER PLACEMENT     VENOUS THROMBECTOMY     Blood clot resection from right arm    Family History  Problem Relation Age of Onset   Liver cancer Father    Diabetes Father    Kidney cancer Father    Heart failure Brother    Heart attack Brother    Tuberculosis Maternal Grandmother    Bone cancer Maternal Grandfather    Diabetes Paternal Uncle  Lung cancer Mother    Coronary artery disease Neg Hx    Sudden death Neg Hx    Cardiomyopathy Neg Hx    Esophageal cancer Neg Hx    Stomach cancer Neg Hx       Controlled substance contract: n/a     Review of Systems  Constitutional:  Negative for diaphoresis.  Eyes:  Negative for pain.  Respiratory:  Negative for shortness of breath.   Cardiovascular:  Negative for chest pain, palpitations and leg swelling.  Gastrointestinal:  Negative for abdominal pain.  Endocrine: Negative for polydipsia.  Skin:  Negative for rash.  Neurological:  Negative for dizziness, weakness and headaches.  Hematological:  Does not bruise/bleed easily.  All other systems reviewed and are negative.      Objective:   Physical Exam Vitals and nursing note reviewed.  Constitutional:      General: She is not in acute distress.    Appearance: Normal appearance. She is well-developed.  HENT:     Head: Normocephalic.     Right Ear: Tympanic membrane normal.     Left Ear: Tympanic membrane normal.     Nose: Nose normal.     Mouth/Throat:     Mouth: Mucous membranes are moist.  Eyes:     Pupils: Pupils are equal, round, and reactive to light.  Neck:     Vascular: No carotid bruit or JVD.  Cardiovascular:     Rate and Rhythm: Normal rate and regular rhythm.     Heart sounds: Normal heart sounds.  Pulmonary:     Effort: Pulmonary effort is normal. No  respiratory distress.     Breath sounds: Normal breath sounds. No wheezing or rales.  Chest:     Chest wall: No tenderness.  Abdominal:     General: Bowel sounds are normal. There is no distension or abdominal bruit.     Palpations: Abdomen is soft. There is no hepatomegaly, splenomegaly, mass or pulsatile mass.     Tenderness: There is no abdominal tenderness.  Musculoskeletal:        General: Normal range of motion.     Cervical back: Normal range of motion and neck supple.  Lymphadenopathy:     Cervical: No cervical adenopathy.  Skin:    General: Skin is warm and dry.  Neurological:     Mental Status: She is alert and oriented to person, place, and time.     Deep Tendon Reflexes: Reflexes are normal and symmetric.  Psychiatric:        Behavior: Behavior normal.        Thought Content: Thought content normal.        Judgment: Judgment normal.    BP 132/85   Pulse 88   Temp 97.6 F (36.4 C) (Temporal)   Resp 20   Ht '5\' 1"'$  (1.549 m)   Wt 195 lb (88.5 kg)   SpO2 95%   BMI 36.84 kg/m         Assessment & Plan:   Angela Michael comes in today with chief complaint of Medical Management of Chronic Issues   Diagnosis and orders addressed:  1. Hyperlipidemia with target LDL less than 100 Low fat diet - CMP14+EGFR - Lipid panel  2. SVT (supraventricular tachycardia) Avoid caffeine - isosorbide dinitrate (ISORDIL) 10 MG tablet; Take 1 tablet (10 mg total) by mouth 2 (two) times daily.  Dispense: 180 tablet; Refill: 1  3. Asymptomatic varicose veins of both lower extremities Wear compression hose  4. Hypokalemia  Labs pending  5. Seizures ([place]) Report any seizure activity - levETIRAcetam (KEPPRA) 500 MG tablet; TAKE 2 TABLETS BY MOUTH 2 TIMES DAILY  Dispense: 360 tablet; Refill: 1  6. Peripheral edema Elevate legs when sitting - furosemide (LASIX) 40 MG tablet; Take 1 tablet (40 mg total) by mouth daily.  Dispense: 90 tablet; Refill: 1  7. Non-Hodgkin's  lymphoma, unspecified body region, unspecified non-Hodgkin lymphoma type [facility]) Keep follow up with oncology  8. Insomnia, unspecified type Bedtime routine  9. Neuropathy of right upper extremity Do not go bare footed  10. Pancytopenia ([place]) Labs pending - Vitamin B12 - CBC with Differential/Platelet  11. Chronic pain syndrome Keep follow up with pain management  12. SIRS (systemic inflammatory response syndrome) (HCC)   13. Recurrent major depressive disorder, in partial remission (HCC) Stress management - DULoxetine (CYMBALTA) 60 MG capsule; Take 2 capsules (120 mg total) by mouth daily.  Dispense: 180 capsule; Refill: 1  14. Morbid obesity ([place]) Discussed diet and exercise for person with BMI >25 Will recheck weight in 3-6 months    Labs pending Health Maintenance reviewed Diet and exercise encouraged  Follow up plan: 6 months   Mary-Margaret Hassell Done, FNP

## 2022-05-23 LAB — LIPID PANEL
Chol/HDL Ratio: 3.6 ratio (ref 0.0–4.4)
Cholesterol, Total: 193 mg/dL (ref 100–199)
HDL: 53 mg/dL (ref >39)
LDL Chol Calc (NIH): 107 mg/dL — ABNORMAL HIGH (ref 0–99)
Triglycerides: 189 mg/dL — ABNORMAL HIGH (ref 0–149)
VLDL Cholesterol Cal: 33 mg/dL (ref 5–40)

## 2022-05-23 LAB — CMP14+EGFR
ALT: 21 IU/L (ref 0–32)
AST: 24 IU/L (ref 0–40)
Albumin/Globulin Ratio: 1.8 (ref 1.2–2.2)
Albumin: 4.2 g/dL (ref 3.8–4.9)
Alkaline Phosphatase: 146 IU/L — ABNORMAL HIGH (ref 44–121)
BUN/Creatinine Ratio: 12 (ref 9–23)
BUN: 12 mg/dL (ref 6–24)
Bilirubin Total: 1 mg/dL (ref 0.0–1.2)
CO2: 28 mmol/L (ref 20–29)
Calcium: 9.5 mg/dL (ref 8.7–10.2)
Chloride: 97 mmol/L (ref 96–106)
Creatinine, Ser: 1.03 mg/dL — ABNORMAL HIGH (ref 0.57–1.00)
Globulin, Total: 2.3 g/dL (ref 1.5–4.5)
Glucose: 152 mg/dL — ABNORMAL HIGH (ref 70–99)
Potassium: 3.6 mmol/L (ref 3.5–5.2)
Sodium: 141 mmol/L (ref 134–144)
Total Protein: 6.5 g/dL (ref 6.0–8.5)
eGFR: 63 mL/min/{1.73_m2} (ref >59)

## 2022-05-23 LAB — CBC WITH DIFFERENTIAL/PLATELET
Basophils Absolute: 0 10*3/uL (ref 0.0–0.2)
Basos: 1 %
EOS (ABSOLUTE): 0.1 10*3/uL (ref 0.0–0.4)
Eos: 2 %
Hematocrit: 42 % (ref 34.0–46.6)
Hemoglobin: 14.2 g/dL (ref 11.1–15.9)
Immature Grans (Abs): 0 10*3/uL (ref 0.0–0.1)
Immature Granulocytes: 0 %
Lymphocytes Absolute: 1.2 10*3/uL (ref 0.7–3.1)
Lymphs: 23 %
MCH: 31.8 pg (ref 26.6–33.0)
MCHC: 33.8 g/dL (ref 31.5–35.7)
MCV: 94 fL (ref 79–97)
Monocytes Absolute: 0.3 10*3/uL (ref 0.1–0.9)
Monocytes: 6 %
Neutrophils Absolute: 3.5 10*3/uL (ref 1.4–7.0)
Neutrophils: 68 %
Platelets: 152 10*3/uL (ref 150–450)
RBC: 4.46 x10E6/uL (ref 3.77–5.28)
RDW: 12.7 % (ref 11.7–15.4)
WBC: 5.2 10*3/uL (ref 3.4–10.8)

## 2022-05-23 LAB — VITAMIN B12: Vitamin B-12: 1125 pg/mL (ref 232–1245)

## 2022-05-27 LAB — SPECIMEN STATUS REPORT

## 2022-05-27 LAB — HGB A1C W/O EAG: Hgb A1c MFr Bld: 5.8 % — ABNORMAL HIGH (ref 4.8–5.6)

## 2022-06-17 ENCOUNTER — Other Ambulatory Visit: Payer: Self-pay | Admitting: Nurse Practitioner

## 2022-06-17 DIAGNOSIS — E785 Hyperlipidemia, unspecified: Secondary | ICD-10-CM

## 2022-06-18 ENCOUNTER — Other Ambulatory Visit: Payer: Self-pay | Admitting: Nurse Practitioner

## 2022-06-18 DIAGNOSIS — I471 Supraventricular tachycardia, unspecified: Secondary | ICD-10-CM

## 2022-07-07 ENCOUNTER — Other Ambulatory Visit: Payer: Self-pay | Admitting: Nurse Practitioner

## 2022-07-07 DIAGNOSIS — R609 Edema, unspecified: Secondary | ICD-10-CM

## 2022-07-25 ENCOUNTER — Telehealth: Payer: Self-pay

## 2022-07-25 NOTE — Transitions of Care (Post Inpatient/ED Visit) (Unsigned)
   07/25/2022  Name: Angela Michael MRN: [id number] DOB: 16-Jan-1961  Today's TOC FU Call Status: Today's TOC FU Call Status:: Unsuccessul Call (1st Attempt) Unsuccessful Call (1st Attempt) Date: 07/25/22  Attempted to reach the patient regarding the most recent Inpatient/ED visit.  Follow Up Plan: Additional outreach attempts will be made to reach the patient to complete the Transitions of Care (Post Inpatient/ED visit) call.   Signature Karena Addison, LPN [facility] Nurse Health Advisor Direct Dial [phone]

## 2022-07-29 NOTE — Transitions of Care (Post Inpatient/ED Visit) (Signed)
   07/29/2022  Name: Angela Michael MRN: [id number] DOB: [birth year]  Today's TOC FU Call Status: Today's TOC FU Call Status:: Successful TOC FU Call Competed Unsuccessful Call (1st Attempt) Date: 07/25/22 [facility] FU Call Complete Date: 07/29/22  Transition Care Management Follow-up Telephone Call Date of Discharge: 07/24/22 Discharge Facility: Other ([facility]) Name of Other ([facility]) Discharge Facility: [facility] Type of Discharge: Inpatient Admission Primary Inpatient Discharge Diagnosis:: hernia How have you been since you were released from the hospital?: Better Any questions or concerns?: No  Items Reviewed: Did you receive and understand the discharge instructions provided?: Yes Medications obtained and verified?: Yes (Medications Reviewed) Any new allergies since your discharge?: No Dietary orders reviewed?: Yes Do you have support at home?: Yes People in Home: child(ren), adult  Home Care and Equipment/Supplies: Were Home Health Services Ordered?: NA Any new equipment or medical supplies ordered?: NA  Functional Questionnaire: Do you need assistance with bathing/showering or dressing?: No Do you need assistance with meal preparation?: No Do you need assistance with eating?: No Do you have difficulty maintaining continence: No Do you need assistance with getting out of bed/getting out of a chair/moving?: No Do you have difficulty managing or taking your medications?: No  Follow up appointments reviewed: PCP Follow-up appointment confirmed?: No (no avail appt times, sent message to staff to schedule) MD Provider Line [phone] Given: No Specialist Hospital Follow-up appointment confirmed?: Yes Date of Specialist follow-up appointment?: 09/04/22 Follow-Up Specialty Provider:: general surgeon Do you need transportation to your follow-up appointment?: No Do you understand care options if your condition(s) worsen?: Yes-patient verbalized  understanding    SIGNATURE Karena Addison, LPN [facility] Nurse Health Advisor Direct Dial [phone]

## 2022-08-24 ENCOUNTER — Other Ambulatory Visit: Payer: Self-pay | Admitting: Nurse Practitioner

## 2022-08-24 DIAGNOSIS — R600 Localized edema: Secondary | ICD-10-CM

## 2022-09-09 ENCOUNTER — Other Ambulatory Visit: Payer: Self-pay | Admitting: Nurse Practitioner

## 2022-09-09 DIAGNOSIS — F5101 Primary insomnia: Secondary | ICD-10-CM

## 2022-09-09 DIAGNOSIS — E785 Hyperlipidemia, unspecified: Secondary | ICD-10-CM

## 2022-09-09 DIAGNOSIS — R569 Unspecified convulsions: Secondary | ICD-10-CM

## 2022-09-11 ENCOUNTER — Telehealth: Payer: Self-pay | Admitting: Nurse Practitioner

## 2022-09-11 NOTE — Telephone Encounter (Signed)
Called and spoke with Pharmacist patient is getting a 30 mg Cymbalta and 60 mg from a carrie Johnson. They dont have a recent one from [facility]

## 2022-09-11 NOTE — Telephone Encounter (Signed)
Lmtcb.

## 2022-09-12 NOTE — Telephone Encounter (Signed)
Patient returning missed call. Please call back

## 2022-10-06 ENCOUNTER — Encounter: Payer: Self-pay | Admitting: *Deleted

## 2022-10-17 ENCOUNTER — Other Ambulatory Visit: Payer: Self-pay | Admitting: Nurse Practitioner

## 2022-10-17 ENCOUNTER — Other Ambulatory Visit: Payer: Self-pay | Admitting: Family Medicine

## 2022-10-17 DIAGNOSIS — F5101 Primary insomnia: Secondary | ICD-10-CM

## 2022-10-17 DIAGNOSIS — E785 Hyperlipidemia, unspecified: Secondary | ICD-10-CM

## 2022-10-17 DIAGNOSIS — R569 Unspecified convulsions: Secondary | ICD-10-CM

## 2022-11-15 ENCOUNTER — Other Ambulatory Visit: Payer: Self-pay | Admitting: Nurse Practitioner

## 2022-11-15 DIAGNOSIS — F5101 Primary insomnia: Secondary | ICD-10-CM

## 2022-11-20 ENCOUNTER — Ambulatory Visit: Payer: Medicaid Other | Admitting: Nurse Practitioner

## 2022-11-25 ENCOUNTER — Ambulatory Visit: Payer: 59 | Admitting: Nurse Practitioner

## 2022-11-25 ENCOUNTER — Encounter: Payer: Self-pay | Admitting: Nurse Practitioner

## 2022-11-25 ENCOUNTER — Ambulatory Visit (INDEPENDENT_AMBULATORY_CARE_PROVIDER_SITE_OTHER): Payer: 59 | Admitting: Nurse Practitioner

## 2022-11-25 ENCOUNTER — Other Ambulatory Visit: Payer: Self-pay | Admitting: Family Medicine

## 2022-11-25 VITALS — BP 99/69 | HR 65 | Temp 98.6°F | Resp 20 | Ht 61.0 in | Wt 191.0 lb

## 2022-11-25 DIAGNOSIS — I8393 Asymptomatic varicose veins of bilateral lower extremities: Secondary | ICD-10-CM | POA: Diagnosis not present

## 2022-11-25 DIAGNOSIS — R739 Hyperglycemia, unspecified: Secondary | ICD-10-CM | POA: Diagnosis not present

## 2022-11-25 DIAGNOSIS — D61818 Other pancytopenia: Secondary | ICD-10-CM

## 2022-11-25 DIAGNOSIS — E785 Hyperlipidemia, unspecified: Secondary | ICD-10-CM

## 2022-11-25 DIAGNOSIS — I471 Supraventricular tachycardia, unspecified: Secondary | ICD-10-CM

## 2022-11-25 DIAGNOSIS — F5101 Primary insomnia: Secondary | ICD-10-CM

## 2022-11-25 DIAGNOSIS — R569 Unspecified convulsions: Secondary | ICD-10-CM

## 2022-11-25 DIAGNOSIS — F3341 Major depressive disorder, recurrent, in partial remission: Secondary | ICD-10-CM

## 2022-11-25 DIAGNOSIS — L409 Psoriasis, unspecified: Secondary | ICD-10-CM | POA: Insufficient documentation

## 2022-11-25 DIAGNOSIS — R600 Localized edema: Secondary | ICD-10-CM

## 2022-11-25 DIAGNOSIS — R6510 Systemic inflammatory response syndrome (SIRS) of non-infectious origin without acute organ dysfunction: Secondary | ICD-10-CM

## 2022-11-25 DIAGNOSIS — C8590 Non-Hodgkin lymphoma, unspecified, unspecified site: Secondary | ICD-10-CM

## 2022-11-25 DIAGNOSIS — E876 Hypokalemia: Secondary | ICD-10-CM

## 2022-11-25 LAB — BAYER DCA HB A1C WAIVED: HB A1C (BAYER DCA - WAIVED): 5.9 % — ABNORMAL HIGH (ref 4.8–5.6)

## 2022-11-25 MED ORDER — DULOXETINE HCL 60 MG PO CPEP
120.0000 mg | ORAL_CAPSULE | Freq: Every day | ORAL | 1 refills | Status: DC
Start: 2022-11-25 — End: 2023-05-18

## 2022-11-25 MED ORDER — TRAZODONE HCL 50 MG PO TABS
ORAL_TABLET | ORAL | 1 refills | Status: DC
Start: 2022-11-25 — End: 2022-12-18

## 2022-11-25 MED ORDER — ISOSORBIDE DINITRATE 10 MG PO TABS
10.0000 mg | ORAL_TABLET | Freq: Two times a day (BID) | ORAL | 1 refills | Status: DC
Start: 2022-11-25 — End: 2023-05-18

## 2022-11-25 MED ORDER — ATORVASTATIN CALCIUM 40 MG PO TABS
ORAL_TABLET | ORAL | 0 refills | Status: DC
Start: 2022-11-25 — End: 2023-04-01

## 2022-11-25 MED ORDER — FUROSEMIDE 40 MG PO TABS: 20.0000 mg | ORAL_TABLET | Freq: Every day | ORAL | 1 refills | Status: DC

## 2022-11-25 MED ORDER — PREGABALIN 75 MG PO CAPS: ORAL_CAPSULE | ORAL | 1 refills | Status: DC

## 2022-11-25 MED ORDER — LEVETIRACETAM 500 MG PO TABS
ORAL_TABLET | ORAL | 1 refills | Status: DC
Start: 2022-11-25 — End: 2023-05-18

## 2022-11-25 MED ORDER — TRIAMCINOLONE ACETONIDE 0.1 % EX CREA: 1.0000 | TOPICAL_CREAM | Freq: Two times a day (BID) | CUTANEOUS | 0 refills | Status: DC

## 2022-11-25 NOTE — Progress Notes (Signed)
Subjective:    Patient ID: Angela Michael, female    DOB: 06/29/1960, 62 y.o.   MRN: [id number]   Chief Complaint: Medical Management of Chronic Issues    HPI:  Angela Michael is a 62 y.o. who identifies as a female who was assigned female at birth.   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 Elevated blood sugar No c/o chest pain, sob or headache. Does not check blood pressure at home BP Readings from Last 3 Encounters:  December 08, 2022 99/69  05/22/22 132/85  11/19/21 123/87     2. Hyperlipidemia with target LDL less than 100 Does not watch diet and does no dedicated exercise. Lab Results  Component Value Date   CHOL 193 05/22/2022   HDL 53 05/22/2022   LDLCALC 107 (H) 05/22/2022   TRIG 189 (H) 05/22/2022   CHOLHDL 3.6 05/22/2022     3. SVT (supraventricular tachycardia) No runs of tachycardia that she is aware of.  4. Asymptomatic varicose veins of both lower extremities Multiple varicosities bil legs. Really does not bother her  5. Pancytopenia (HCC) Lab Results  Component Value Date   WBC 5.2 05/22/2022   HGB 14.2 05/22/2022   HCT 42.0 05/22/2022   MCV 94 05/22/2022   PLT 152 05/22/2022     6. Hypokalemia Denies any muscle cramps  7. Peripheral edema Has daily edema of lower ext.  8. Seizures (HCC) No recent seizure activity.  9. SIRS (systemic inflammatory response syndrome) (HCC) Was seeing rheumatology but has not seen in some time.  10. Recurrent major depressive disorder, in partial remission (HCC) Has been depressed for years. Increased when husband died. Is on cymbalta daily    2022-12-08   12:28 PM 05/22/2022   11:57 AM 11/19/2021   10:14 AM  Depression screen PHQ 2/9  Decreased Interest 0 1 1  Down, Depressed, Hopeless 0 1 1  PHQ - 2 Score 0  2 2  Altered sleeping 0 0 2  Tired, decreased energy 0 1 1  Change in appetite 0 1 0  Feeling bad or failure about yourself  0 0 0  Trouble concentrating 0 1 0  Moving slowly or fidgety/restless 0 0 0  Suicidal thoughts 0 0 0  PHQ-9 Score 0 5 5  Difficult doing work/chores Not difficult at all Somewhat difficult Somewhat difficult     11. Insomnia, unspecified type Is on trazadone to sleep and is sleeping ok most nights.  12. Non-Hodgkin's lymphoma, unspecified body region, unspecified non-Hodgkin lymphoma type [facility]) Last saw oncology on 10/14/22. She is currently on no treatments. They are monitoring several areas. She will have repeat CT scan in October.  13. Morbid obesity (HCC) Weight is down 4lbs Wt Readings from Last 3 Encounters:  12/08/22 191 lb (86.6 kg)  05/22/22 195 lb (88.5 kg)  11/19/21 197 lb (89.4 kg)   BMI Readings from Last 3 Encounters:  2022-12-08 36.09 kg/m  05/22/22 36.84 kg/m  11/19/21 37.22 kg/m     New complaints: None today  Allergies  Allergen Reactions   Advicor [Niacin-Lovastatin Er] Rash    ALLERGY / INTOLERANCE   Niacin Rash   Outpatient Encounter Medications as of 2022-12-08  Medication Sig   acetaminophen (TYLENOL) 500 MG tablet Take 1,000 mg every 6 (six)  hours as needed by mouth for moderate pain or headache.   aspirin 81 MG tablet Take 81 mg by mouth daily. Reported on 06/21/2015   atorvastatin (LIPITOR) 40 MG tablet TAKE 1 TABLET BY MOUTH EVERYDAY AT BEDTIME   BUTRANS 20 MCG/HR PTWK 1 patch once a week.   cyanocobalamin (VITAMIN B12) 1000 MCG/ML injection INJECT 1 ML (1,000 MCG) INTRAMUSCULARLY EVERY 30 DAYS   cyclobenzaprine (FLEXERIL) 5 MG tablet TAKE 1 TABLET BY MOUTH EVERYDAY AT BEDTIME   DULoxetine (CYMBALTA) 60 MG capsule Take 2 capsules (120 mg total) by mouth daily.   fluticasone (FLONASE) 50 MCG/ACT nasal spray PLACE 2 SPRAYS INTO THE NOSE AS NEEDED. FOR ALLERGIES   furosemide (LASIX) 40 MG tablet TAKE 1 TABLET BY MOUTH  EVERY DAY *EMERGENCY REFILL*   isosorbide dinitrate (ISORDIL) 10 MG tablet Take 1 tablet (10 mg total) by mouth 2 (two) times daily.   levETIRAcetam (KEPPRA) 500 MG tablet TAKE 2 TABLETS BY MOUTH TWICE A DAY   loratadine (CLARITIN) 10 MG tablet Take 10 mg daily as needed by mouth for allergies.    Multiple Vitamin (MULTIVITAMIN WITH MINERALS) TABS Take 1 tablet by mouth daily. Reported on 06/21/2015   nitroGLYCERIN (NITROSTAT) 0.4 MG SL tablet Place 1 tablet (0.4 mg total) under the tongue every 5 (five) minutes as needed for chest pain.   polyethylene glycol powder (GLYCOLAX/MIRALAX) powder Take 17 g by mouth daily as needed. (Patient taking differently: Take 17 g by mouth daily as needed for moderate constipation.)   pregabalin (LYRICA) 75 MG capsule Take 1 capsule (75mg ) in the morning, 1 capsule (75mg ) in the afternoon, and 2 capsules (150mg ) at night.   traZODone (DESYREL) 50 MG tablet TAKE 1 TO 3 TABLETS BY MOUTH AT BEDTIME   [DISCONTINUED] Fe Fum-FePoly-FA-Vit C-Vit B3 (FOLIVANE-F) 125-1 MG CAPS Take 1 capsule by mouth daily.   No facility-administered encounter medications on file as of 11/25/2022.    Past Surgical History:  Procedure Laterality Date   ABDOMINOPLASTY N/A 03/02/2017   Procedure: BACK LIPECTOMY;  Surgeon: Glenna Fellows, MD;  Location: [facility] OR;  Service: Plastics;  Laterality: N/A;   ABDOMINOPLASTY/PANNICULECTOMY N/A 03/02/2017   Procedure: ABDOMINOPLASTY/PANNICULECTOMY;  Surgeon: Glenna Fellows, MD;  Location: [facility] OR;  Service: Plastics;  Laterality: N/A;   BREAST SURGERY     " uplift"   CATARACT EXTRACTION Right    CATARACT EXTRACTION W/ INTRAOCULAR LENS  IMPLANT, BILATERAL     CHOLECYSTECTOMY     COLONOSCOPY     ENDOVENOUS ABLATION SAPHENOUS VEIN W/ LASER Right 09-08-2013   EVLA right small saphenous vein and stab phlebectomy 10-20 incisions right leg by Gretta Began MD   GASTRIC BYPASS  2007   Says she had lost 345 lbs   LYMPH NODE DISSECTION Left    Resected    lymph node removed from stomach     MEDIAL PARTIAL KNEE REPLACEMENT  05/10/14   left knee   MEDIAL PARTIAL KNEE REPLACEMENT Left 06/04/2015   [facility]   SKIN SURGERY     Removal of excess skin   TOOTH EXTRACTION Right 02/20/2017   VENA CAVA FILTER PLACEMENT     VENOUS THROMBECTOMY     Blood clot resection from right arm    Family History  Problem Relation Age of Onset   Liver cancer Father    Diabetes Father    Kidney cancer Father    Heart failure Brother    Heart attack Brother    Tuberculosis Maternal Grandmother    Bone  cancer Maternal Grandfather    Diabetes Paternal Uncle    Lung cancer Mother    Coronary artery disease Neg Hx    Sudden death Neg Hx    Cardiomyopathy Neg Hx    Esophageal cancer Neg Hx    Stomach cancer Neg Hx       Controlled substance contract: needs to be updated due to lyrica     Review of Systems  Constitutional:  Negative for diaphoresis.  Eyes:  Negative for pain.  Respiratory:  Negative for shortness of breath.   Cardiovascular:  Negative for chest pain, palpitations and leg swelling.  Gastrointestinal:  Negative for abdominal pain.  Endocrine: Negative for polydipsia.  Skin:  Negative for rash.  Neurological:  Negative for dizziness, weakness and headaches.  Hematological:  Does not bruise/bleed easily.  All other systems reviewed and are negative.      Objective:   Physical Exam Vitals and nursing note reviewed.  Constitutional:      General: She is not in acute distress.    Appearance: Normal appearance. She is well-developed.  HENT:     Head: Normocephalic.     Right Ear: Tympanic membrane normal.     Left Ear: Tympanic membrane normal.     Nose: Nose normal.     Mouth/Throat:     Mouth: Mucous membranes are moist.  Eyes:     Pupils: Pupils are equal, round, and reactive to light.  Neck:     Vascular: No carotid bruit or JVD.  Cardiovascular:     Rate and Rhythm: Normal rate and regular rhythm.     Heart sounds:  Normal heart sounds.  Pulmonary:     Effort: Pulmonary effort is normal. No respiratory distress.     Breath sounds: Normal breath sounds. No wheezing or rales.  Chest:     Chest wall: No tenderness.  Abdominal:     General: Bowel sounds are normal. There is no distension or abdominal bruit.     Palpations: Abdomen is soft. There is no hepatomegaly, splenomegaly, mass or pulsatile mass.     Tenderness: There is no abdominal tenderness.  Musculoskeletal:        General: Normal range of motion.     Cervical back: Normal range of motion and neck supple.     Right lower leg: Edema (1+) present.     Left lower leg: Edema (1+) present.  Lymphadenopathy:     Cervical: No cervical adenopathy.  Skin:    General: Skin is warm and dry.     Comments: Erythematous spots with silvery plaque in spots on bil lower ext.  Neurological:     Mental Status: She is alert and oriented to person, place, and time.     Deep Tendon Reflexes: Reflexes are normal and symmetric.  Psychiatric:        Behavior: Behavior normal.        Thought Content: Thought content normal.        Judgment: Judgment normal.    Hgba1c 5.9%   BP 99/69   Pulse 65   Temp 98.6 F (37 C) (Temporal)   Resp 20   Ht 5\' 1"  (1.549 m)   Wt 191 lb (86.6 kg)   SpO2 94%   BMI 36.09 kg/m      Assessment & Plan:  Angela Michael comes in today with chief complaint of Medical Management of Chronic Issues   Diagnosis and orders addressed:  1. Elevated blood sugar Low carb diet - Bayer  DCA Hb A1c Waived  2. Hyperlipidemia with target LDL less than 100 Low fat diet - CBC with Differential/Platelet - CMP14+EGFR - Lipid panel - atorvastatin (LIPITOR) 40 MG tablet; TAKE 1 TABLET BY MOUTH EVERYDAY AT BEDTIME  Dispense: 90 tablet; Refill: 0  3. SVT (supraventricular tachycardia) Avoid caffeine - isosorbide dinitrate (ISORDIL) 10 MG tablet; Take 1 tablet (10 mg total) by mouth 2 (two) times daily.  Dispense: 180 tablet;  Refill: 1  4. Asymptomatic varicose veins of both lower extremities Wear compression socks  5. Pancytopenia (HCC) Labs pending  6. Hypokalemia labspending  7. Peripheral edema Elevate legs when sitting Compression socks - furosemide (LASIX) 40 MG tablet; Take 0.5 tablets (20 mg total) by mouth daily.  Dispense: 90 tablet; Refill: 1  8. Seizures (HCC) Report any seizure activity - levETIRAcetam (KEPPRA) 500 MG tablet; TAKE 2 TABLETS BY MOUTH TWICE A DAY  Dispense: 360 tablet; Refill: 1  9. SIRS (systemic inflammatory response syndrome) (HCC)  10. Recurrent major depressive disorder, in partial remission (HCC) Stress management - DULoxetine (CYMBALTA) 60 MG capsule; Take 2 capsules (120 mg total) by mouth daily.  Dispense: 180 capsule; Refill: 1  11. Non-Hodgkin's lymphoma, unspecified body region, unspecified non-Hodgkin lymphoma type [facility]) Keep follow up with oncology  12. Morbid obesity (HCC) Discussed diet and exercise for person with BMI >25 Will recheck weight in 3-6 months  13. Psoriasis Avoid scratching areas - triamcinolone cream (KENALOG) 0.1 %; Apply 1 Application topically 2 (two) times daily.  Dispense: 453 g; Refill: 0  14. Primary insomnia Bedtime routine - traZODone (DESYREL) 50 MG tablet; TAKE 1 TO 3 TABLETS BY MOUTH AT BEDTIME  Dispense: 90 tablet; Refill: 1   Labs pending Health Maintenance reviewed Diet and exercise encouraged  Follow up plan: 6 months   Mary-Margaret Daphine Deutscher, FNP

## 2022-11-30 ENCOUNTER — Other Ambulatory Visit: Payer: Self-pay | Admitting: Nurse Practitioner

## 2022-11-30 DIAGNOSIS — R600 Localized edema: Secondary | ICD-10-CM

## 2022-12-10 ENCOUNTER — Other Ambulatory Visit: Payer: Self-pay | Admitting: Nurse Practitioner

## 2022-12-10 ENCOUNTER — Other Ambulatory Visit: Payer: Self-pay | Admitting: Family Medicine

## 2022-12-10 DIAGNOSIS — F5101 Primary insomnia: Secondary | ICD-10-CM

## 2022-12-10 DIAGNOSIS — L409 Psoriasis, unspecified: Secondary | ICD-10-CM

## 2022-12-16 ENCOUNTER — Other Ambulatory Visit: Payer: Self-pay | Admitting: Nurse Practitioner

## 2022-12-16 DIAGNOSIS — G5691 Unspecified mononeuropathy of right upper limb: Secondary | ICD-10-CM

## 2022-12-16 DIAGNOSIS — F5101 Primary insomnia: Secondary | ICD-10-CM

## 2023-01-02 ENCOUNTER — Encounter: Payer: Self-pay | Admitting: Nurse Practitioner

## 2023-01-06 ENCOUNTER — Encounter: Payer: Self-pay | Admitting: Nurse Practitioner

## 2023-01-06 ENCOUNTER — Telehealth (INDEPENDENT_AMBULATORY_CARE_PROVIDER_SITE_OTHER): Payer: Medicaid Other | Admitting: Nurse Practitioner

## 2023-01-06 DIAGNOSIS — L409 Psoriasis, unspecified: Secondary | ICD-10-CM | POA: Diagnosis not present

## 2023-01-06 NOTE — Patient Instructions (Signed)
Psoriasis Psoriasis is a long-term (chronic) skin condition that causes raised, red patches (plaques) to form on the skin. Plaques may show up anywhere on your body. They can be any size or shape. Symptoms of this condition range from mild to very severe. Psoriasis cannot be passed from one person to another (is not contagious). What are the causes? The exact cause of this condition is not known. Psoriasis is an autoimmune disease. With this type of disease, the body's defense system (immune system) mistakenly attacks healthy skin. As a result, too many skin cells form too quickly and create plaques. The disease may start due to a trigger, such as: Damage or trauma to the skin, such as cuts, scrapes, sunburn, and dryness. Not enough exposure to sunlight. Certain medicines. Alcohol or tobacco use. Stress. Infections. What increases the risk? You are more likely to develop this condition if you: Have a family history of psoriasis. Are obese. Are 45-33 years old. Are taking certain medicines. What are the signs or symptoms?  There are different types of psoriasis. You can have more than one type of psoriasis during your life. Sometimes, symptoms get worse for a period of time. These are called flares. Each type of psoriasis has different symptoms. Plaque psoriasis is the most common type. Symptoms include red, raised plaques with a silvery-white coating (scale) that are usually on the scalp, elbows, knees, and back. These plaques may be itchy. Your nails may be pitted and crumbly or fall off. Guttate psoriasis symptoms include small red spots that often show up on your trunk, arms, and legs. These spots may develop after you have been sick, especially with strep throat. Inverse psoriasis symptoms include plaques in your underarm area, under your breasts, or on your genitals, groin, or buttocks. Infection, friction, and heat may cause this type of psoriasis. Pustular psoriasis symptoms include  pus-filled bumps that are painful, red, and swollen on the palms of your hands or the soles of your feet. They can affect mobility. You also may feel fatigued, feverish, weak, or have no appetite. Erythrodermic psoriasis symptoms include bright red skin that may look burned. You may have a fast heartbeat and a body temperature that is too high or too low. You may be itchy or in pain. Sebopsoriasis symptoms include red plaques that have a greasy coating and are often on your scalp, forehead, and face. Psoriatic arthritis causes swollen, painful joints along with scaly skin plaques. Your nails may be pitted and crumbly or fall off. How is this diagnosed? This condition is diagnosed based on your symptoms, family history, and physical exam. Your health care provider may remove a tissue sample (biopsy) for testing. You may also be referred to a health care provider who specializes in skin diseases (dermatologist). How is this treated? There is no cure for this condition, but treatment can help manage it. Goals of treatment include: Helping your skin heal. Reducing itching and inflammation. Slowing the growth of new skin cells. Treating any related conditions. Psoriasis can add to your risk of developing other conditions, such as heart disease, high blood pressure, eye problems, and depression. Treatment varies depending on the severity of your condition. This condition may be treated with: Creams or ointments to help with symptoms. Ultraviolet ray exposure (light therapy or phototherapy). This may include natural sunlight or light therapy in a medical office. Systemic therapy medicines. These medicines can help your body better manage skin cell turnover and inflammation. Medicines may be given as pills or injections.  Biologic medicines. These medicines are usually given as injections or through an IV. These are helpful for many people with severe psoriasis, but there is an increased risk of  infection. Follow these instructions at home: Skin care Moisturize your skin as needed. Only use moisturizers that your health care provider says are okay. Apply cool, wet cloths (cold compresses) to the affected areas. This helps with itching. Do not use a hot tub or take hot showers. Take lukewarm showers and baths. Do not scratch your skin. Lifestyle Maintain a healthy weight. Eat a healthy diet that includes plenty of vegetables, fruits, whole grains, low-fat dairy products, and lean protein. Do not eat a lot of foods that are high in solid fats, added sugars, or salt. Use techniques for stress reduction, such as meditation or yoga. Do not use any products that contain nicotine or tobacco. These products include cigarettes, chewing tobacco, and vaping devices, such as e-cigarettes. If you need help quitting, ask your health care provider. Get safe exposure to the sun as told by your health care provider. This may include spending time outdoors in sunlight. Do not get sunburned. Consider joining a psoriasis support group. General instructions  Take or use over-the-counter and prescription medicines only as told by your health care provider. Keep a journal to help track what triggers an outbreak. Try to avoid any triggers. Do not drink alcohol if your health care provider tells you not to drink. See a mental health therapist if you feel sad, anxious, frustrated, and hopeless. Managing this condition can be challenging and you may feel overwhelmed and depressed. Keep all follow-up visits. Your health care provider may monitor your condition over time to make sure that it does not cause problems or get worse. Where to find support National Psoriasis Foundation: psoriasis.org Where to find more information American Academy of Dermatology: InfoExam.si Contact a health care provider if: You have a fever or chills. Your signs or symptoms gets worse. You have more redness or warmth in the  affected areas. You have new or worsening pain or stiffness in your joints. Your nails break easily or pull away from the nail bed. You feel depressed, frustrated, or hopeless about your condition. Get help right away if: You have thoughts of hurting yourself or others. Get help right away if you feel like you may hurt yourself or others, or have thoughts about taking your own life. Go to your nearest emergency room or: Call 911. Call the National Suicide Prevention Lifeline at 6090628660 or 988. This is open 24 hours a day. Text the Crisis Text Line at 430-100-2127. Summary Psoriasis is a long-term (chronic) skin condition that causes raised, red patches (plaques) to form on the skin. There is no cure for this condition, but treatment can help manage it. Treatment varies depending on the severity of your condition. Keep a journal to track what triggers an outbreak. Try to avoid any triggers. This information is not intended to replace advice given to you by your health care provider. Make sure you discuss any questions you have with your health care provider. Document Revised: 06/05/2021 Document Reviewed: 06/05/2021 Elsevier Patient Education  2024 [employer].

## 2023-01-06 NOTE — Progress Notes (Signed)
Virtual Visit Consent   Angela Michael, you are scheduled for a virtual visit with Mary-Margaret Daphine Deutscher, FNP, a [facility] provider, today.     Just as with appointments in the office, your consent must be obtained to participate.  Your consent will be active for this visit and any virtual visit you may have with one of our providers in the next 365 days.     If you have a MyChart account, a copy of this consent can be sent to you electronically.  All virtual visits are billed to your insurance company just like a traditional visit in the office.    As this is a virtual visit, video technology does not allow for your provider to perform a traditional examination.  This may limit your provider's ability to fully assess your condition.  If your provider identifies any concerns that need to be evaluated in person or the need to arrange testing (such as labs, EKG, etc.), we will make arrangements to do so.     Although advances in technology are sophisticated, we cannot ensure that it will always work on either your end or our end.  If the connection with a video visit is poor, the visit may have to be switched to a telephone visit.  With either a video or telephone visit, we are not always able to ensure that we have a secure connection.     I need to obtain your verbal consent now.   Are you willing to proceed with your visit today? YES   Angela Michael has provided verbal consent on 01/06/2023 for a virtual visit (video or telephone).   Mary-Margaret Daphine Deutscher, FNP   Date: 01/06/2023 3:25 PM   Virtual Visit via Video Note   I, Mary-Margaret Daphine Deutscher, connected with Angela Michael ([id number], 1960/09/02) on 01/06/23 at  4:45 PM EDT by a video-enabled telemedicine application and verified that I am speaking with the correct person using two identifiers.  Location: Patient: Virtual Visit Location Patient: Home Provider: Virtual Visit Location Provider: Mobile   I discussed the limitations  of evaluation and management by telemedicine and the availability of in person appointments. The patient expressed understanding and agreed to proceed.    History of Present Illness: Angela Michael is a 62 y.o. who identifies as a female who was assigned female at birth, and is being seen today for skin lesioln.  HPI: Patient has psoriasis on her legs and is spreading to her back. She was given triamcinolone cream which is not helping. She recently had surgery and annot take steroids. Needs to see dermatology.    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 Reported on 06/21/2015, Disp: , Rfl:    atorvastatin (LIPITOR) 40 MG tablet, TAKE 1 TABLET BY MOUTH EVERYDAY AT BEDTIME, Disp: 90 tablet, Rfl: 0   BUTRANS 20 MCG/HR PTWK, 1 patch once a week., Disp: , Rfl:    cyanocobalamin (VITAMIN B12) 1000 MCG/ML injection, INJECT 1 ML (1,000 MCG) INTRAMUSCULARLY EVERY 30  DAYS, Disp: 3 mL, Rfl: 0   cyclobenzaprine (FLEXERIL) 5 MG tablet, TAKE 1 TABLET BY MOUTH EVERY DAY AT BEDTIME, Disp: 21 tablet, Rfl: 0   DULoxetine (CYMBALTA) 60 MG capsule, Take 2 capsules (120 mg total) by mouth daily., Disp: 180 capsule, Rfl: 1   fluticasone (FLONASE) 50 MCG/ACT nasal spray, PLACE 2 SPRAYS INTO THE NOSE AS NEEDED. FOR ALLERGIES, Disp: 16 g, Rfl: 3   furosemide (LASIX) 40 MG tablet, Take 0.5 tablets (20 mg total) by mouth daily., Disp: 90 tablet, Rfl: 1   isosorbide dinitrate (ISORDIL) 10 MG tablet, Take 1 tablet (10 mg total) by mouth 2 (two) times daily., Disp: 180 tablet, Rfl: 1   levETIRAcetam (KEPPRA) 500 MG tablet, TAKE 2 TABLETS BY MOUTH TWICE A DAY, Disp: 360 tablet, Rfl: 1   loratadine (CLARITIN) 10 MG tablet, Take 10 mg daily as needed by mouth for allergies. , Disp: , Rfl:    Multiple Vitamin (MULTIVITAMIN WITH MINERALS) TABS, Take 1 tablet by mouth daily. Reported on 06/21/2015, Disp: , Rfl:    nitroGLYCERIN (NITROSTAT) 0.4 MG SL tablet, Place 1 tablet (0.4 mg total) under the tongue every 5 (five) minutes as needed for chest pain., Disp: 25 tablet, Rfl: 3   polyethylene glycol powder (GLYCOLAX/MIRALAX) powder, Take 17 g by mouth daily as needed. (Patient taking differently: Take 17 g by mouth daily as needed for moderate constipation.), Disp: 3350 g, Rfl: 1   pregabalin (LYRICA) 75 MG capsule, Take 1 capsule (75mg ) in the morning, 1 capsule (75mg ) in the afternoon, and 2 capsules (150mg ) at night., Disp: 90 capsule, Rfl: 1   traZODone (DESYREL) 50 MG tablet, TAKE 1-3 TABLETS BY MOUTH AT BEDTIME, Disp: 90 tablet, Rfl: 0   triamcinolone cream (KENALOG) 0.1 %, Apply 1 Application topically 2 (two) times daily., Disp: 453 g, Rfl: 0  Observations/Objective: Patient is well-developed, well-nourished in no acute distress.  Resting comfortably  at home.  Head is normocephalic, atraumatic.  No labored breathing.  Speech is clear and coherent with logical content.  Patient  is alert and oriented at baseline.  Lost video connection so could not see lesions on back  Assessment and Plan:  Angela Michael in today with chief complaint of skin lesion   1. Psoriasis Avoid scratching Avoid hot showers or baths Continue triamcinolone cream as can - Ambulatory referral to Dermatology    Follow Up Instructions: I discussed the assessment and treatment plan with the patient. The patient was provided an opportunity to ask questions and all were answered. The patient agreed with the plan and demonstrated an understanding of the instructions.  A copy of instructions were sent to the patient via MyChart.  The patient was advised to call back or seek an in-person evaluation if the symptoms worsen or if the condition fails to improve as anticipated.  Time:  I spent 5 minutes with the patient via telehealth technology discussing the  above problems/concerns.    Mary-Margaret Daphine Deutscher, FNP

## 2023-01-13 ENCOUNTER — Other Ambulatory Visit: Payer: Self-pay | Admitting: Nurse Practitioner

## 2023-01-13 DIAGNOSIS — L409 Psoriasis, unspecified: Secondary | ICD-10-CM

## 2023-01-16 ENCOUNTER — Other Ambulatory Visit: Payer: Self-pay | Admitting: Nurse Practitioner

## 2023-01-16 DIAGNOSIS — G5691 Unspecified mononeuropathy of right upper limb: Secondary | ICD-10-CM

## 2023-01-27 ENCOUNTER — Telehealth: Payer: Self-pay | Admitting: Nurse Practitioner

## 2023-01-27 MED ORDER — POTASSIUM CHLORIDE CRYS ER 20 MEQ PO TBCR
20.0000 meq | EXTENDED_RELEASE_TABLET | Freq: Every day | ORAL | 3 refills | Status: DC
Start: 2023-01-27 — End: 2023-04-27

## 2023-01-27 NOTE — Telephone Encounter (Signed)
Will also send in potassium supplement for you. Meds ordered this encounter  Medications   potassium chloride SA (KLOR-CON M) 20 MEQ tablet    Sig: Take 1 tablet (20 mEq total) by mouth daily.    Dispense:  30 tablet    Refill:  3    Order Specific Question:   Supervising Provider    Answer:   Nils Pyle [[id number]]   Mary-Margaret Daphine Deutscher, FNP

## 2023-01-27 NOTE — Telephone Encounter (Signed)
Only lab that was low that concerns me is potassium- eat some potatoes and bananna for now. Need to recheck in couple of days.

## 2023-01-28 NOTE — Telephone Encounter (Signed)
Patient aware and verbalizes understanding. 

## 2023-01-30 ENCOUNTER — Other Ambulatory Visit: Payer: Self-pay | Admitting: Nurse Practitioner

## 2023-01-30 DIAGNOSIS — F5101 Primary insomnia: Secondary | ICD-10-CM

## 2023-02-05 ENCOUNTER — Telehealth: Payer: Self-pay | Admitting: Nurse Practitioner

## 2023-02-05 DIAGNOSIS — F5101 Primary insomnia: Secondary | ICD-10-CM

## 2023-02-05 NOTE — Telephone Encounter (Signed)
  Prescription Request  02/05/2023  Is this a "Controlled Substance" medicine? no  Have you seen your PCP in the last 2 weeks? no  If YES, route message to pool  -  If NO, patient needs to be scheduled for appointment.  What is the name of the medication or equipment? Furosemide 40 mg - Refill request sent on 10-21 for Furosemide and Trazadone. They didn't receive the Trazadone and we show it was confirmed 10-21. Please call.  Have you contacted your pharmacy to request a refill? yes   Which pharmacy would you like this sent to? [facility]   Patient notified that their request is being sent to the clinical staff for review and that they should receive a response within 2 business days.

## 2023-02-06 MED ORDER — TRAZODONE HCL 50 MG PO TABS: ORAL_TABLET | ORAL | 0 refills | Status: DC

## 2023-02-06 NOTE — Telephone Encounter (Signed)
TC back to [facility] they did not receive refill. recent

## 2023-02-07 ENCOUNTER — Other Ambulatory Visit: Payer: Self-pay | Admitting: Nurse Practitioner

## 2023-02-12 ENCOUNTER — Other Ambulatory Visit: Payer: Self-pay | Admitting: Nurse Practitioner

## 2023-02-12 DIAGNOSIS — G5691 Unspecified mononeuropathy of right upper limb: Secondary | ICD-10-CM

## 2023-02-13 ENCOUNTER — Other Ambulatory Visit: Payer: Self-pay | Admitting: Nurse Practitioner

## 2023-02-13 DIAGNOSIS — R600 Localized edema: Secondary | ICD-10-CM

## 2023-02-16 DIAGNOSIS — E279 Disorder of adrenal gland, unspecified: Secondary | ICD-10-CM | POA: Diagnosis not present

## 2023-02-17 DIAGNOSIS — I2510 Atherosclerotic heart disease of native coronary artery without angina pectoris: Secondary | ICD-10-CM | POA: Diagnosis not present

## 2023-02-17 DIAGNOSIS — C8290 Follicular lymphoma, unspecified, unspecified site: Secondary | ICD-10-CM | POA: Diagnosis not present

## 2023-02-18 ENCOUNTER — Other Ambulatory Visit: Payer: Self-pay | Admitting: Nurse Practitioner

## 2023-02-18 DIAGNOSIS — F5101 Primary insomnia: Secondary | ICD-10-CM

## 2023-03-14 ENCOUNTER — Other Ambulatory Visit: Payer: Self-pay | Admitting: Nurse Practitioner

## 2023-03-14 DIAGNOSIS — R600 Localized edema: Secondary | ICD-10-CM

## 2023-03-16 ENCOUNTER — Other Ambulatory Visit: Payer: Self-pay | Admitting: Nurse Practitioner

## 2023-03-16 DIAGNOSIS — G5691 Unspecified mononeuropathy of right upper limb: Secondary | ICD-10-CM

## 2023-03-16 DIAGNOSIS — F5101 Primary insomnia: Secondary | ICD-10-CM

## 2023-03-30 ENCOUNTER — Telehealth: Payer: Self-pay | Admitting: Family Medicine

## 2023-03-30 NOTE — Telephone Encounter (Signed)
Copied from CRM [id number]. Topic: Clinical - Medical Advice >> Mar 30, 2023 12:43 PM Gaetano Hawthorne wrote: Reason for CRM: psoriasis on her legs and her back is not improving. She was given triamcinolone cream which is not helping. Patient was referred to dermatology, however, everyone who takes her insurance can't see her until March '25 - Is there anything else that Angela Michael can call in outside of the triamcinolone cream.

## 2023-03-30 NOTE — Telephone Encounter (Signed)
Please advise 

## 2023-03-31 ENCOUNTER — Other Ambulatory Visit: Payer: Self-pay | Admitting: Nurse Practitioner

## 2023-03-31 DIAGNOSIS — E785 Hyperlipidemia, unspecified: Secondary | ICD-10-CM

## 2023-03-31 NOTE — Telephone Encounter (Signed)
Patient notified and verbalized understanding. 

## 2023-03-31 NOTE — Telephone Encounter (Signed)
Will have  to wait to see derm- mixed triamcinolone cream with OTC eucerin cream and apply 2x a day

## 2023-04-14 ENCOUNTER — Other Ambulatory Visit: Payer: Self-pay | Admitting: Nurse Practitioner

## 2023-04-14 DIAGNOSIS — F5101 Primary insomnia: Secondary | ICD-10-CM

## 2023-04-14 DIAGNOSIS — G5691 Unspecified mononeuropathy of right upper limb: Secondary | ICD-10-CM

## 2023-04-16 NOTE — Telephone Encounter (Signed)
 Last office visit 01/06/23 Cyclobenzaprine last refill 03/17/23, #21, no refills Trazodone last refill 03/17/23, #90, no refills Cyanocobalamin last refill 09/10/22, 3 ml, no refills

## 2023-04-27 ENCOUNTER — Other Ambulatory Visit: Payer: Self-pay | Admitting: Nurse Practitioner

## 2023-04-30 DIAGNOSIS — I2510 Atherosclerotic heart disease of native coronary artery without angina pectoris: Secondary | ICD-10-CM | POA: Diagnosis not present

## 2023-04-30 DIAGNOSIS — I480 Paroxysmal atrial fibrillation: Secondary | ICD-10-CM | POA: Diagnosis not present

## 2023-05-08 DIAGNOSIS — I491 Atrial premature depolarization: Secondary | ICD-10-CM | POA: Diagnosis not present

## 2023-05-08 DIAGNOSIS — I441 Atrioventricular block, second degree: Secondary | ICD-10-CM | POA: Diagnosis not present

## 2023-05-08 DIAGNOSIS — I4719 Other supraventricular tachycardia: Secondary | ICD-10-CM | POA: Diagnosis not present

## 2023-05-08 DIAGNOSIS — I493 Ventricular premature depolarization: Secondary | ICD-10-CM | POA: Diagnosis not present

## 2023-05-18 ENCOUNTER — Encounter: Payer: Self-pay | Admitting: Nurse Practitioner

## 2023-05-18 ENCOUNTER — Telehealth (HOSPITAL_COMMUNITY): Payer: Self-pay

## 2023-05-18 ENCOUNTER — Ambulatory Visit (INDEPENDENT_AMBULATORY_CARE_PROVIDER_SITE_OTHER): Payer: 59 | Admitting: Nurse Practitioner

## 2023-05-18 VITALS — BP 130/72 | HR 70 | Temp 98.0°F | Ht 61.0 in | Wt 183.0 lb

## 2023-05-18 DIAGNOSIS — C8590 Non-Hodgkin lymphoma, unspecified, unspecified site: Secondary | ICD-10-CM

## 2023-05-18 DIAGNOSIS — G5691 Unspecified mononeuropathy of right upper limb: Secondary | ICD-10-CM | POA: Diagnosis not present

## 2023-05-18 DIAGNOSIS — R600 Localized edema: Secondary | ICD-10-CM

## 2023-05-18 DIAGNOSIS — R6510 Systemic inflammatory response syndrome (SIRS) of non-infectious origin without acute organ dysfunction: Secondary | ICD-10-CM

## 2023-05-18 DIAGNOSIS — I8393 Asymptomatic varicose veins of bilateral lower extremities: Secondary | ICD-10-CM | POA: Diagnosis not present

## 2023-05-18 DIAGNOSIS — I471 Supraventricular tachycardia, unspecified: Secondary | ICD-10-CM | POA: Diagnosis not present

## 2023-05-18 DIAGNOSIS — E785 Hyperlipidemia, unspecified: Secondary | ICD-10-CM

## 2023-05-18 DIAGNOSIS — D61818 Other pancytopenia: Secondary | ICD-10-CM | POA: Diagnosis not present

## 2023-05-18 DIAGNOSIS — G894 Chronic pain syndrome: Secondary | ICD-10-CM

## 2023-05-18 DIAGNOSIS — G4700 Insomnia, unspecified: Secondary | ICD-10-CM | POA: Diagnosis not present

## 2023-05-18 DIAGNOSIS — F3341 Major depressive disorder, recurrent, in partial remission: Secondary | ICD-10-CM

## 2023-05-18 DIAGNOSIS — E876 Hypokalemia: Secondary | ICD-10-CM

## 2023-05-18 DIAGNOSIS — R569 Unspecified convulsions: Secondary | ICD-10-CM

## 2023-05-18 LAB — LIPID PANEL

## 2023-05-18 MED ORDER — DULOXETINE HCL 60 MG PO CPEP: 120.0000 mg | ORAL_CAPSULE | Freq: Every day | ORAL | 1 refills | Status: DC

## 2023-05-18 MED ORDER — ATORVASTATIN CALCIUM 80 MG PO TABS: 80.0000 mg | ORAL_TABLET | Freq: Every day | ORAL | 1 refills | Status: DC

## 2023-05-18 MED ORDER — LEVETIRACETAM 500 MG PO TABS: ORAL_TABLET | ORAL | 1 refills | Status: DC

## 2023-05-18 MED ORDER — PREGABALIN 75 MG PO CAPS: ORAL_CAPSULE | ORAL | 1 refills | Status: DC

## 2023-05-18 MED ORDER — POTASSIUM CHLORIDE CRYS ER 20 MEQ PO TBCR: 20.0000 meq | EXTENDED_RELEASE_TABLET | Freq: Every day | ORAL | 1 refills | Status: AC

## 2023-05-18 MED ORDER — ISOSORBIDE DINITRATE 10 MG PO TABS: 10.0000 mg | ORAL_TABLET | Freq: Two times a day (BID) | ORAL | 1 refills | Status: DC

## 2023-05-18 MED ORDER — FUROSEMIDE 40 MG PO TABS: 40.0000 mg | ORAL_TABLET | Freq: Every day | ORAL | 1 refills | Status: DC

## 2023-05-18 NOTE — Progress Notes (Signed)
Subjective:    Patient ID: Angela Michael, female    DOB: 08/29/1960, 63 y.o.   MRN: [id number]   Chief Complaint: No chief complaint on file.    HPI:  Angela Michael is a 63 y.o. who identifies as a female who was assigned female at birth.   Social history: Lives with: daughters and their families Work history: disability   Comes in today for follow up of the following chronic medical issues:  1. Hyperlipidemia with target LDL less than 100 Does not watch diet and does no dedicated exercise Lab Results  Component Value Date   CHOL 200 (H) 11/25/2022   HDL 45 11/25/2022   LDLCALC 124 (H) 11/25/2022   TRIG 172 (H) 11/25/2022   CHOLHDL 4.4 11/25/2022     2. SVT (supraventricular tachycardia) No recent palpitations or heart racing. Is still or isordil.  3. Asymptomatic varicose veins of both lower extremities Do not really bother her  4. Hypokalemia Denies muscle cramps Lab Results  Component Value Date   K 3.7 11/25/2022     5. Seizures (HCC) Is on keppra and reports no seizure activity. Has not seen  neurologyin awhile.  6. Peripheral edema Has edema of lower ext daily  7. Non-Hodgkin's lymphoma, unspecified body region, unspecified non-Hodgkin lymphoma type [facility]) Last saw  oncology on 02/18/22. Treatment ended in 2018. At last visit their was no reoccurence.  8. Insomnia, unspecified type Is on trazadone to sleep and sleeps about 7-8hours a night.  9. Neuropathy of right upper extremity Takes lyrica daily. Has constant burning in her feet.  10. Pancytopenia (HCC) Lab Results  Component Value Date   WBC 4.1 11/25/2022   HGB 13.3 11/25/2022   HCT 39.7 11/25/2022   MCV 93 11/25/2022   PLT 137 (L) 11/25/2022     11. Chronic pain syndrome See pain management  12. SIRS (systemic inflammatory response syndrome) (HCC) No complaints today  13. Recurrent major depressive disorder, in partial remission (HCC) Her husband passed away over a year  ago ago. She is having a hard time. She had yo move out of her house because she could not afford electricity bill, so electricity was cut off to the house. She had to stay on a friends house for several months. She has since moved back to her house and is tryig to get it redy to sell. She takes her cymbalta daily and that helps her alot    05/18/2023   11:54 AM 11/25/2022   12:28 PM 05/22/2022   11:57 AM  Depression screen PHQ 2/9  Decreased Interest 0 0 1  Down, Depressed, Hopeless 1 0 1  PHQ - 2 Score 1 0 2  Altered sleeping 3 0 0  Tired, decreased energy 0 0 1  Change in appetite 0 0 1  Feeling bad or failure about yourself  0 0 0  Trouble concentrating 0 0 1  Moving slowly or fidgety/restless 0 0 0  Suicidal thoughts 0 0 0  PHQ-9 Score 4 0 5  Difficult doing work/chores Not difficult at all Not difficult at all Somewhat difficult      14. Morbid obesity (HCC) She had gastric bypass surgery years ago and has been putting her weight back on for some time. Weight is down 8lbs  Wt Readings from Last 3 Encounters:  05/18/23 183 lb (83 kg)  11/25/22 191 lb (86.6 kg)  05/22/22 195 lb (88.5 kg)   BMI Readings from Last 3 Encounters:  05/18/23 34.58 kg/m  11/25/22 36.09 kg/m  05/22/22 36.84 kg/m       New complaints: None  today  Allergies  Allergen Reactions   Advicor [Niacin-Lovastatin Er] Rash    ALLERGY / INTOLERANCE   Niacin Rash   Outpatient Encounter Medications as of 05/18/2023  Medication Sig   acetaminophen (TYLENOL) 500 MG tablet Take 1,000 mg every 6 (six) hours as needed by mouth for moderate pain or headache.   aspirin 81 MG tablet Take 81 mg by mouth daily. Reported on 06/21/2015   atorvastatin (LIPITOR) 40 MG tablet TAKE 1 TABLET BY MOUTH AT BEDTIME   BUTRANS 20 MCG/HR PTWK 1 patch once a week.   cyanocobalamin (VITAMIN B12) 1000 MCG/ML injection INJECT INTRAMUSCULARLY EVERY 30 DAYS   cyclobenzaprine (FLEXERIL) 5 MG tablet TAKE 1 TABLET BY MOUTH EVERY  DAY AT BEDTIME   DULoxetine (CYMBALTA) 60 MG capsule Take 2 capsules (120 mg total) by mouth daily.   fluticasone (FLONASE) 50 MCG/ACT nasal spray PLACE 2 SPRAYS INTO THE NOSE AS NEEDED. FOR ALLERGIES   furosemide (LASIX) 40 MG tablet TAKE 1 TABLET BY MOUTH EVERY DAY   isosorbide dinitrate (ISORDIL) 10 MG tablet Take 1 tablet (10 mg total) by mouth 2 (two) times daily.   levETIRAcetam (KEPPRA) 500 MG tablet TAKE 2 TABLETS BY MOUTH TWICE A DAY   loratadine (CLARITIN) 10 MG tablet Take 10 mg daily as needed by mouth for allergies.    Multiple Vitamin (MULTIVITAMIN WITH MINERALS) TABS Take 1 tablet by mouth daily. Reported on 06/21/2015   nitroGLYCERIN (NITROSTAT) 0.4 MG SL tablet Place 1 tablet (0.4 mg total) under the tongue every 5 (five) minutes as needed for chest pain.   polyethylene glycol powder (GLYCOLAX/MIRALAX) powder Take 17 g by mouth daily as needed. (Patient taking differently: Take 17 g by mouth daily as needed for moderate constipation.)   potassium chloride SA (KLOR-CON M20) 20 MEQ tablet TAKE 1 TABLET BY MOUTH EVERY DAY   pregabalin (LYRICA) 75 MG capsule TAKE 1 CAPSULE IN THE MORNING, 1 CAPSULE IN THE AFTERNOON, AND 2 CAPSULES AT NIGHT.   traZODone (DESYREL) 50 MG tablet TAKE 1-3 TABLETS BY MOUTH AT BEDTIME   triamcinolone cream (KENALOG) 0.1 % APPLY TO AFFECTED AREA TWICE A DAY   No facility-administered encounter medications on file as of 05/18/2023.    Past Surgical History:  Procedure Laterality Date   ABDOMINOPLASTY N/A 03/02/2017   Procedure: BACK LIPECTOMY;  Surgeon: Glenna Fellows, MD;  Location: [facility] OR;  Service: Plastics;  Laterality: N/A;   ABDOMINOPLASTY/PANNICULECTOMY N/A 03/02/2017   Procedure: ABDOMINOPLASTY/PANNICULECTOMY;  Surgeon: Glenna Fellows, MD;  Location: [facility] OR;  Service: Plastics;  Laterality: N/A;   BREAST SURGERY     " uplift"   CATARACT EXTRACTION Right    CATARACT EXTRACTION W/ INTRAOCULAR LENS  IMPLANT, BILATERAL     CHOLECYSTECTOMY      COLONOSCOPY     ENDOVENOUS ABLATION SAPHENOUS VEIN W/ LASER Right 09-08-2013   EVLA right small saphenous vein and stab phlebectomy 10-20 incisions right leg by Gretta Began MD   GASTRIC BYPASS  2007   Says she had lost 345 lbs   LYMPH NODE DISSECTION Left    Resected   lymph node removed from stomach     MEDIAL PARTIAL KNEE REPLACEMENT  05/10/14   left knee   MEDIAL PARTIAL KNEE REPLACEMENT Left 06/04/2015   [facility]   SKIN SURGERY     Removal of excess skin   TOOTH EXTRACTION Right 02/20/2017   VENA CAVA  FILTER PLACEMENT     VENOUS THROMBECTOMY     Blood clot resection from right arm    Family History  Problem Relation Age of Onset   Liver cancer Father    Diabetes Father    Kidney cancer Father    Heart failure Brother    Heart attack Brother    Tuberculosis Maternal Grandmother    Bone cancer Maternal Grandfather    Diabetes Paternal Uncle    Lung cancer Mother    Coronary artery disease Neg Hx    Sudden death Neg Hx    Cardiomyopathy Neg Hx    Esophageal cancer Neg Hx    Stomach cancer Neg Hx       Controlled substance contract: n/a     Review of Systems  Constitutional:  Negative for diaphoresis.  Eyes:  Negative for pain.  Respiratory:  Negative for shortness of breath.   Cardiovascular:  Negative for chest pain, palpitations and leg swelling.  Gastrointestinal:  Negative for abdominal pain.  Endocrine: Negative for polydipsia.  Skin:  Negative for rash.  Neurological:  Negative for dizziness, weakness and headaches.  Hematological:  Does not bruise/bleed easily.  All other systems reviewed and are negative.      Objective:   Physical Exam Vitals and nursing note reviewed.  Constitutional:      General: She is not in acute distress.    Appearance: Normal appearance. She is well-developed.  HENT:     Head: Normocephalic.     Right Ear: Tympanic membrane normal.     Left Ear: Tympanic membrane normal.     Nose: Nose normal.     Mouth/Throat:      Mouth: Mucous membranes are moist.  Eyes:     Pupils: Pupils are equal, round, and reactive to light.  Neck:     Vascular: No carotid bruit or JVD.  Cardiovascular:     Rate and Rhythm: Normal rate and regular rhythm.     Heart sounds: Normal heart sounds.  Pulmonary:     Effort: Pulmonary effort is normal. No respiratory distress.     Breath sounds: Normal breath sounds. No wheezing or rales.  Chest:     Chest wall: No tenderness.  Abdominal:     General: Bowel sounds are normal. There is no distension or abdominal bruit.     Palpations: Abdomen is soft. There is no hepatomegaly, splenomegaly, mass or pulsatile mass.     Tenderness: There is no abdominal tenderness.  Musculoskeletal:        General: Normal range of motion.     Cervical back: Normal range of motion and neck supple.  Lymphadenopathy:     Cervical: No cervical adenopathy.  Skin:    General: Skin is warm and dry.  Neurological:     Mental Status: She is alert and oriented to person, place, and time.     Deep Tendon Reflexes: Reflexes are normal and symmetric.  Psychiatric:        Behavior: Behavior normal.        Thought Content: Thought content normal.        Judgment: Judgment normal.    BP 130/72   Pulse 70   Temp 98 F (36.7 C) (Temporal)   Ht 5\' 1"  (1.549 m)   Wt 183 lb (83 kg)   SpO2 96%   BMI 34.58 kg/m          Assessment & Plan:   Angela Michael comes in today with chief complaint  of No chief complaint on file.   Diagnosis and orders addressed:  1. Hyperlipidemia with target LDL less than 100 Low fat diet - CMP14+EGFR - Lipid panel  2. SVT (supraventricular tachycardia) Avoid caffeine - isosorbide dinitrate (ISORDIL) 10 MG tablet; Take 1 tablet (10 mg total) by mouth 2 (two) times daily.  Dispense: 180 tablet; Refill: 1  3. Asymptomatic varicose veins of both lower extremities Wear compression hose  4. Hypokalemia Labs pending  5. Seizures (HCC) Report any seizure  activity - levETIRAcetam (KEPPRA) 500 MG tablet; TAKE 2 TABLETS BY MOUTH 2 TIMES DAILY  Dispense: 360 tablet; Refill: 1  6. Peripheral edema Elevate legs when sitting - furosemide (LASIX) 40 MG tablet; Take 1 tablet (40 mg total) by mouth daily.  Dispense: 90 tablet; Refill: 1  7. Non-Hodgkin's lymphoma, unspecified body region, unspecified non-Hodgkin lymphoma type [facility] Keep follow up with oncology  8. Insomnia, unspecified type Bedtime routine  9. Neuropathy of right upper extremity Do not go bare footed  10. Pancytopenia (HCC) Labs pending - Vitamin B12 - CBC with Differential/Platelet  11. Chronic pain syndrome Keep follow up with pain management  12. SIRS (systemic inflammatory response syndrome) (HCC)   13. Recurrent major depressive disorder, in partial remission (HCC) Stress management - DULoxetine (CYMBALTA) 60 MG capsule; Take 2 capsules (120 mg total) by mouth daily.  Dispense: 180 capsule; Refill: 1  14. Morbid obesity (HCC) Discussed diet and exercise for person with BMI >25 Will recheck weight in 3-6 months    Labs pending Health Maintenance reviewed Diet and exercise encouraged  Follow up plan: 6 months   Mary-Margaret Daphine Deutscher, FNP

## 2023-05-18 NOTE — Addendum Note (Signed)
Addended by: Bennie Pierini on: 05/18/2023 12:07 PM   Modules accepted: Orders

## 2023-05-18 NOTE — Patient Instructions (Signed)

## 2023-05-18 NOTE — Telephone Encounter (Signed)
Pharmacy Patient Advocate Encounter   Received notification from CoverMyMeds that prior authorization for Pregabalin 75 mg capsules is required/requested.   Insurance verification completed.   The patient is insured through [employer] [facility] .   Per test claim: PA required; PA started via CoverMyMeds. KEY [id number] . Waiting for clinical questions to populate.

## 2023-05-19 LAB — CMP14+EGFR
ALT: 39 IU/L — ABNORMAL HIGH (ref 0–32)
AST: 32 IU/L (ref 0–40)
Albumin: 4.1 g/dL (ref 3.8–4.9)
Alkaline Phosphatase: 167 IU/L — ABNORMAL HIGH (ref 44–121)
BUN/Creatinine Ratio: 15 (ref 12–28)
BUN: 13 mg/dL (ref 8–27)
Bilirubin Total: 0.7 mg/dL (ref 0.0–1.2)
CO2: 24 mmol/L (ref 20–29)
Calcium: 9.2 mg/dL (ref 8.7–10.3)
Chloride: 104 mmol/L (ref 96–106)
Creatinine, Ser: 0.87 mg/dL (ref 0.57–1.00)
Globulin, Total: 2.2 g/dL (ref 1.5–4.5)
Glucose: 131 mg/dL — ABNORMAL HIGH (ref 70–99)
Potassium: 4.4 mmol/L (ref 3.5–5.2)
Sodium: 142 mmol/L (ref 134–144)
Total Protein: 6.3 g/dL (ref 6.0–8.5)
eGFR: 76 mL/min/{1.73_m2} (ref >59)

## 2023-05-19 LAB — CBC WITH DIFFERENTIAL/PLATELET
Basophils Absolute: 0 10*3/uL (ref 0.0–0.2)
Basos: 1 %
EOS (ABSOLUTE): 0.1 10*3/uL (ref 0.0–0.4)
Eos: 2 %
Hematocrit: 42.6 % (ref 34.0–46.6)
Hemoglobin: 13.7 g/dL (ref 11.1–15.9)
Immature Grans (Abs): 0 10*3/uL (ref 0.0–0.1)
Immature Granulocytes: 1 %
Lymphocytes Absolute: 1.3 10*3/uL (ref 0.7–3.1)
Lymphs: 24 %
MCH: 31.1 pg (ref 26.6–33.0)
MCHC: 32.2 g/dL (ref 31.5–35.7)
MCV: 97 fL (ref 79–97)
Monocytes Absolute: 0.3 10*3/uL (ref 0.1–0.9)
Monocytes: 6 %
Neutrophils Absolute: 3.6 10*3/uL (ref 1.4–7.0)
Neutrophils: 66 %
Platelets: 170 10*3/uL (ref 150–450)
RBC: 4.4 x10E6/uL (ref 3.77–5.28)
RDW: 13 % (ref 11.7–15.4)
WBC: 5.4 10*3/uL (ref 3.4–10.8)

## 2023-05-19 LAB — LIPID PANEL
Cholesterol, Total: 167 mg/dL (ref 100–199)
HDL: 53 mg/dL (ref >39)
LDL CALC COMMENT:: 3.2 ratio (ref 0.0–4.4)
LDL Chol Calc (NIH): 91 mg/dL (ref 0–99)
Triglycerides: 129 mg/dL (ref 0–149)
VLDL Cholesterol Cal: 23 mg/dL (ref 5–40)

## 2023-05-23 ENCOUNTER — Other Ambulatory Visit: Payer: Self-pay | Admitting: Nurse Practitioner

## 2023-05-30 DIAGNOSIS — I4510 Unspecified right bundle-branch block: Secondary | ICD-10-CM | POA: Diagnosis not present

## 2023-06-04 DIAGNOSIS — M19012 Primary osteoarthritis, left shoulder: Secondary | ICD-10-CM | POA: Diagnosis not present

## 2023-06-08 DIAGNOSIS — M6740 Ganglion, unspecified site: Secondary | ICD-10-CM | POA: Diagnosis not present

## 2023-06-08 DIAGNOSIS — M67472 Ganglion, left ankle and foot: Secondary | ICD-10-CM | POA: Diagnosis not present

## 2023-06-08 DIAGNOSIS — Z9889 Other specified postprocedural states: Secondary | ICD-10-CM | POA: Diagnosis not present

## 2023-06-08 DIAGNOSIS — M2042 Other hammer toe(s) (acquired), left foot: Secondary | ICD-10-CM | POA: Diagnosis not present

## 2023-06-08 DIAGNOSIS — M25872 Other specified joint disorders, left ankle and foot: Secondary | ICD-10-CM | POA: Diagnosis not present

## 2023-06-11 ENCOUNTER — Telehealth: Payer: Self-pay | Admitting: Pharmacy Technician

## 2023-06-11 ENCOUNTER — Other Ambulatory Visit (HOSPITAL_COMMUNITY): Payer: Self-pay

## 2023-06-11 NOTE — Telephone Encounter (Signed)
 Pharmacy Patient Advocate Encounter   Received notification from CoverMyMeds that prior authorization for PREGABALIN 75MG  CAPUSLES is required/requested.   Insurance verification completed.   The patient is insured through [employer] [facility] .   Per test claim: PA required; PA submitted to above mentioned insurance via CoverMyMeds Key/confirmation #/EOC [id number] Status is pending

## 2023-06-12 ENCOUNTER — Other Ambulatory Visit (HOSPITAL_COMMUNITY): Payer: Self-pay

## 2023-06-12 NOTE — Telephone Encounter (Signed)
 Pharmacy Patient Advocate Encounter  Received notification from [employer] [facility] that Prior Authorization for PREGABALIN 75MG  CAPSULE has been APPROVED from 06/10/2023 to 06/09/2024. Unable to obtain price due to refill too soon rejection, last fill date 06/11/2023 next available fill date03/17/2025.   PA #/Case ID/Reference #:  [id number]

## 2023-06-29 DIAGNOSIS — Z5181 Encounter for therapeutic drug level monitoring: Secondary | ICD-10-CM | POA: Diagnosis not present

## 2023-06-29 DIAGNOSIS — L409 Psoriasis, unspecified: Secondary | ICD-10-CM | POA: Diagnosis not present

## 2023-07-10 DIAGNOSIS — I480 Paroxysmal atrial fibrillation: Secondary | ICD-10-CM | POA: Diagnosis not present

## 2023-07-16 ENCOUNTER — Other Ambulatory Visit: Payer: Self-pay | Admitting: Nurse Practitioner

## 2023-07-16 DIAGNOSIS — L409 Psoriasis, unspecified: Secondary | ICD-10-CM

## 2023-07-21 DIAGNOSIS — Z4509 Encounter for adjustment and management of other cardiac device: Secondary | ICD-10-CM | POA: Diagnosis not present

## 2023-07-22 DIAGNOSIS — I471 Supraventricular tachycardia, unspecified: Secondary | ICD-10-CM | POA: Diagnosis not present

## 2023-07-23 DIAGNOSIS — I491 Atrial premature depolarization: Secondary | ICD-10-CM | POA: Diagnosis not present

## 2023-07-23 DIAGNOSIS — I499 Cardiac arrhythmia, unspecified: Secondary | ICD-10-CM | POA: Diagnosis not present

## 2023-08-03 DIAGNOSIS — Z5181 Encounter for therapeutic drug level monitoring: Secondary | ICD-10-CM | POA: Diagnosis not present

## 2023-08-03 DIAGNOSIS — L409 Psoriasis, unspecified: Secondary | ICD-10-CM | POA: Diagnosis not present

## 2023-08-03 DIAGNOSIS — L820 Inflamed seborrheic keratosis: Secondary | ICD-10-CM | POA: Diagnosis not present

## 2023-08-11 DIAGNOSIS — Z7901 Long term (current) use of anticoagulants: Secondary | ICD-10-CM | POA: Diagnosis not present

## 2023-08-11 DIAGNOSIS — I498 Other specified cardiac arrhythmias: Secondary | ICD-10-CM | POA: Diagnosis not present

## 2023-08-11 DIAGNOSIS — I493 Ventricular premature depolarization: Secondary | ICD-10-CM | POA: Diagnosis not present

## 2023-08-11 DIAGNOSIS — Z4509 Encounter for adjustment and management of other cardiac device: Secondary | ICD-10-CM | POA: Diagnosis not present

## 2023-08-11 DIAGNOSIS — I4891 Unspecified atrial fibrillation: Secondary | ICD-10-CM | POA: Diagnosis not present

## 2023-08-17 ENCOUNTER — Other Ambulatory Visit: Payer: Self-pay | Admitting: Nurse Practitioner

## 2023-08-17 DIAGNOSIS — C8290 Follicular lymphoma, unspecified, unspecified site: Secondary | ICD-10-CM | POA: Diagnosis not present

## 2023-08-17 DIAGNOSIS — G5691 Unspecified mononeuropathy of right upper limb: Secondary | ICD-10-CM

## 2023-08-18 ENCOUNTER — Other Ambulatory Visit: Payer: Self-pay | Admitting: Nurse Practitioner

## 2023-08-18 ENCOUNTER — Telehealth: Payer: Self-pay | Admitting: Nurse Practitioner

## 2023-08-18 ENCOUNTER — Other Ambulatory Visit: Payer: Self-pay

## 2023-08-18 DIAGNOSIS — R9341 Abnormal radiologic findings on diagnostic imaging of renal pelvis, ureter, or bladder: Secondary | ICD-10-CM | POA: Diagnosis not present

## 2023-08-18 DIAGNOSIS — C8290 Follicular lymphoma, unspecified, unspecified site: Secondary | ICD-10-CM | POA: Diagnosis not present

## 2023-08-18 DIAGNOSIS — F3341 Major depressive disorder, recurrent, in partial remission: Secondary | ICD-10-CM

## 2023-08-18 DIAGNOSIS — R600 Localized edema: Secondary | ICD-10-CM

## 2023-08-18 NOTE — Telephone Encounter (Signed)
 Copied from CRM [id number]. Topic: Clinical - Prescription Issue >> Aug 18, 2023 10:26 AM Karole Pacer C wrote: Reason for CRM: Patient states she was advised by the pharmacy that the following medication: DULoxetine  (CYMBALTA ) 60 MG capsule was denied by the provider and she would like to know why.

## 2023-08-18 NOTE — Telephone Encounter (Signed)
 Request sent to pcp.

## 2023-09-03 ENCOUNTER — Other Ambulatory Visit: Payer: Self-pay | Admitting: Nurse Practitioner

## 2023-09-03 NOTE — Telephone Encounter (Signed)
 pharmacy: PT SAYS SHE TAKE THE 30MG  IN ADDITION TO THE 60MG  DAILY. PT REQUEST REFILL ON THE DULOXETINE  30MG . THANK YOU   NOT on current med list, Last OV 05/18/23 Please advise

## 2023-09-04 DIAGNOSIS — M19012 Primary osteoarthritis, left shoulder: Secondary | ICD-10-CM | POA: Diagnosis not present

## 2023-09-11 DIAGNOSIS — R001 Bradycardia, unspecified: Secondary | ICD-10-CM | POA: Diagnosis not present

## 2023-09-11 DIAGNOSIS — R3989 Other symptoms and signs involving the genitourinary system: Secondary | ICD-10-CM | POA: Diagnosis not present

## 2023-10-01 ENCOUNTER — Other Ambulatory Visit: Payer: Self-pay | Admitting: Nurse Practitioner

## 2023-10-01 DIAGNOSIS — L409 Psoriasis, unspecified: Secondary | ICD-10-CM

## 2023-10-02 DIAGNOSIS — R3989 Other symptoms and signs involving the genitourinary system: Secondary | ICD-10-CM | POA: Diagnosis not present

## 2023-10-05 DIAGNOSIS — Z4509 Encounter for adjustment and management of other cardiac device: Secondary | ICD-10-CM | POA: Diagnosis not present

## 2023-10-08 DIAGNOSIS — R1031 Right lower quadrant pain: Secondary | ICD-10-CM | POA: Diagnosis not present

## 2023-10-12 DIAGNOSIS — R001 Bradycardia, unspecified: Secondary | ICD-10-CM | POA: Diagnosis not present

## 2023-10-30 ENCOUNTER — Ambulatory Visit: Payer: Self-pay

## 2023-10-30 ENCOUNTER — Ambulatory Visit: Admitting: Nurse Practitioner

## 2023-10-30 ENCOUNTER — Encounter: Payer: Self-pay | Admitting: Nurse Practitioner

## 2023-10-30 VITALS — BP 136/90 | HR 67 | Temp 97.9°F | Ht 61.0 in | Wt 200.0 lb

## 2023-10-30 DIAGNOSIS — L247 Irritant contact dermatitis due to plants, except food: Secondary | ICD-10-CM

## 2023-10-30 MED ORDER — PREDNISONE 20 MG PO TABS: ORAL_TABLET | ORAL | 0 refills | Status: DC

## 2023-10-30 MED ORDER — METHYLPREDNISOLONE ACETATE 80 MG/ML IJ SUSP
80.0000 mg | Freq: Once | INTRAMUSCULAR | Status: AC
  Administered 2023-10-30: 80 mg via INTRAMUSCULAR

## 2023-10-30 NOTE — Progress Notes (Signed)
   Subjective:    Patient ID: Angela Michael, female    DOB: 11/14/1960, 63 y.o.   MRN: [id number]   Chief Complaint: Poison Oak   HPI  Patient has been doing yard work and has gotten into some poison ivy. Very itchy. Nothing at home has been working. 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  Eyes:  Negative for pain.  Respiratory:  Negative for shortness of breath.   Cardiovascular:  Negative for chest pain, palpitations and leg swelling.  Gastrointestinal:  Negative for abdominal pain.  Endocrine: Negative for polydipsia.  Skin:  Negative for rash.  Neurological:  Negative for dizziness, weakness and headaches.  Hematological:  Does not bruise/bleed easily.  All other systems reviewed and are negative.      Objective:   Physical Exam Constitutional:      Appearance: Normal appearance. She is obese.  Cardiovascular:     Rate and Rhythm: Normal rate and regular rhythm.     Heart sounds: Normal heart sounds.  Pulmonary:     Breath sounds: Normal breath sounds.  Skin:    General: Skin is warm.     Findings: Rash (erythematous vesicular lesions in linear pattern on arms and face) present.  Neurological:     General: No  focal deficit present.     Mental Status: She is alert and oriented to person, place, and time.  Psychiatric:        Mood and Affect: Mood normal.        Behavior: Behavior normal.    BP (!) 136/90   Pulse 67   Temp 97.9 F (36.6 C) (Temporal)   Ht 5' 1 (1.549 m)   Wt 200 lb (90.7 kg)   SpO2 97%   BMI 37.79 kg/m         Assessment & Plan:   .Angela Michael in today with chief complaint of Poison Oak   1. Irritant contact dermatitis due to plants, except food (Primary) Avoid scratching Cool compresses Benadryl OTC - predniSONE (DELTASONE) 20 MG tablet; 2 po at sametime daily for 5 days-  Dispense: 10 tablet; Refill: 0    The above assessment and management plan was discussed with the patient. The patient verbalized understanding of and has agreed to the management plan. Patient is aware to call the clinic if symptoms persist or worsen. Patient is aware when to return to the clinic for a follow-up visit. Patient educated on when it is appropriate to go to the emergency department.   Mary-Margaret Gladis, FNP

## 2023-10-30 NOTE — Patient Instructions (Signed)
Poison Ivy Dermatitis Poison ivy dermatitis is irritation and swelling (inflammation) of the skin caused by chemicals in the leaves of the poison ivy plant. The skin reaction often involves redness, blisters, and extreme itching. What are the causes? This condition is caused by a chemical (urushiol) found in the sap of the poison ivy plant. This chemical is sticky and can easily spread to people, animals, and objects. You can get poison ivy dermatitis by: Having direct contact with a poison ivy plant. Touching animals, other people, or objects that have come in contact with poison ivy and have the chemical on them. What increases the risk? This condition is more likely to develop in people who: Are outdoors often in wooded or marshy areas. Go outdoors without wearing protective clothing, such as closed shoes, long pants, and a long-sleeved shirt. What are the signs or symptoms? Symptoms of this condition include: Redness of the skin. Extreme itching. A rash that often includes bumps and blisters. The rash usually appears 48 hours after exposure, if you have been exposed before. If this is the first time you have been exposed, the rash may not appear until a week after exposure. Swelling. This may occur if the reaction is more severe. Symptoms usually last for 1-2 weeks. However, the first time you develop this condition, symptoms may last 3-4 weeks. How is this diagnosed? This condition may be diagnosed based on your symptoms and a physical exam. Your health care provider may also ask you about any recent outdoor activity. How is this treated? Treatment for this condition will vary depending on how severe it is. Treatment may include: Hydrocortisone cream or calamine lotion to relieve itching. Oatmeal baths to soothe the skin. Medicines, such as over-the-counter antihistamine tablets. Oral or injected steroid medicine, for more severe reactions. Follow these instructions at  home: Medicines Take or apply over-the-counter and prescription medicines only as told by your health care provider. Use hydrocortisone cream or calamine lotion as needed to soothe the skin and relieve itching. General instructions Do not scratch or rub your skin. Apply a cold, wet cloth (cold compress) to the affected areas or take baths in cool water. This will help with itching. Avoid hot baths and showers. Take oatmeal baths as needed. Use colloidal oatmeal. You can get this at your local pharmacy or grocery store. Follow the instructions on the packaging. Wash all clothes, bedsheets, towels, and blankets you were in contact with between your exposure and appearance of the rash. Check the affected area every day for signs of infection. Check for: More redness, swelling, or pain. Fluid or blood. Warmth. Pus or a bad smell. Keep all follow-up visits. Your health care provider may want to see how your skin is progressing with treatment. How is this prevented?  Learn to identify the poison ivy plant and avoid contact with the plant. This plant can be recognized by the number of leaves. Generally, poison ivy has three leaves with flowering branches on a single stem. The leaves are typically glossy, and they have jagged edges that come to a point. If you have been exposed to poison ivy, thoroughly wash with soap and water right away. You have about 30 minutes to remove the plant resin before it will cause the rash. Be sure to wash under your fingernails, because any plant resin there will continue to spread the rash. When hiking or camping, wear clothes that will help you to avoid skin exposure. This includes long pants, a long-sleeved shirt, long socks,   and hiking boots. You can also apply preventive lotion to your skin to help limit exposure. If you suspect that your clothes or outdoor gear came in contact with poison ivy, rinse them off outside with a garden hose before you bring them inside  your house. When doing yard work or gardening, wear gloves, long sleeves, long pants, and boots. Wash your garden tools and gloves if they come in contact with poison ivy. If you suspect that your pet has come into contact with poison ivy, wash them with pet shampoo and water. Make sure to wear gloves while washing your pet. Contact a health care provider if: You have open sores in the rash area. You have any signs of infection. You have redness that spreads beyond the rash area. You have a fever. You have a rash over a large area of your body. You have a rash on your eyes, mouth, or genitals. You have a rash that does not improve after a few weeks. Get help right away if: Your face swells or your eyes swell shut. You have trouble breathing. You have trouble swallowing. These symptoms may be an emergency. Get help right away. Call 911. Do not wait to see if the symptoms will go away. Do not drive yourself to the hospital. This information is not intended to replace advice given to you by your health care provider. Make sure you discuss any questions you have with your health care provider. Document Revised: 08/29/2021 Document Reviewed: 08/29/2021 Elsevier Patient Education  2024 [employer]  

## 2023-10-30 NOTE — Telephone Encounter (Signed)
 FYI Only or Action Required?: FYI only for provider.  Patient was last seen in primary care on 05/18/2023 by Gladis Mustard, FNP.  Called Nurse Triage reporting Poison Ivy.  Symptoms began yesterday.  Interventions attempted: Nothing.  Symptoms are: gradually worsening.  Triage Disposition: See HCP Within 4 Hours (Or PCP Triage)  Patient/caregiver understands and will follow disposition?: Yes      Copied from CRM [id number]. Topic: Clinical - Red Word Triage >> Oct 30, 2023  8:08 AM Myrick T wrote: [facility] that prompted transfer to Nurse Triage: patient called stated her cat rolled in poison ivy and now it is all over her body including her face   ----------------------------------------------------------------------- From previous Reason for Contact - Scheduling: Patient/patient representative is calling to schedule an appointment. Refer to attachments for appointment information. Reason for Disposition  [1] Severe poison ivy, oak, or sumac reaction in the past AND [2] face or genitals involved  Answer Assessment - Initial Assessment Questions 1. APPEARANCE of RASH: What does the rash look like?      States cat rolled in poison ivy, all over legs, neck, face, breast, small bumps, red 2. LOCATION: Where is the rash located?  (e.g., face, genitals, hands, legs)     See above 3. SIZE: How large is the rash?      large 4. ONSET: When did the rash begin?      Last night 5. ITCHING: Does the rash itch? If Yes, ask: How bad is it?     Yes, severe 6. EXPOSURE:  How were you exposed to the plant (poison ivy, poison oak, sumac)  When were you exposed?  Note: Sometimes a poison ivy/oak/sumac rash does not appear for 2 to 3 weeks after exposure.      Poison ivy 7. PAST HISTORY: Have you had a poison ivy rash before? If Yes, ask: How bad was it?     yes 8. OTHER SYMPTOMS: Do you have any other symptoms? (e.g., fever)      no 9. PREGNANCY: Is there any  chance you are pregnant? When was your last menstrual period?     na  Protocols used: Poison Ivy - Oak - [facility]

## 2023-11-05 DIAGNOSIS — I471 Supraventricular tachycardia, unspecified: Secondary | ICD-10-CM | POA: Diagnosis not present

## 2023-11-05 DIAGNOSIS — I2510 Atherosclerotic heart disease of native coronary artery without angina pectoris: Secondary | ICD-10-CM | POA: Diagnosis not present

## 2023-11-05 DIAGNOSIS — I480 Paroxysmal atrial fibrillation: Secondary | ICD-10-CM | POA: Diagnosis not present

## 2023-11-07 DIAGNOSIS — I499 Cardiac arrhythmia, unspecified: Secondary | ICD-10-CM | POA: Diagnosis not present

## 2023-11-27 DIAGNOSIS — Z95818 Presence of other cardiac implants and grafts: Secondary | ICD-10-CM | POA: Diagnosis not present

## 2023-12-03 DIAGNOSIS — M19012 Primary osteoarthritis, left shoulder: Secondary | ICD-10-CM | POA: Diagnosis not present

## 2023-12-03 DIAGNOSIS — M25511 Pain in right shoulder: Secondary | ICD-10-CM | POA: Diagnosis not present

## 2023-12-03 DIAGNOSIS — L409 Psoriasis, unspecified: Secondary | ICD-10-CM | POA: Diagnosis not present

## 2023-12-03 DIAGNOSIS — M67911 Unspecified disorder of synovium and tendon, right shoulder: Secondary | ICD-10-CM | POA: Diagnosis not present

## 2023-12-03 DIAGNOSIS — M19011 Primary osteoarthritis, right shoulder: Secondary | ICD-10-CM | POA: Diagnosis not present

## 2023-12-10 ENCOUNTER — Other Ambulatory Visit: Payer: Self-pay | Admitting: Nurse Practitioner

## 2023-12-10 DIAGNOSIS — R600 Localized edema: Secondary | ICD-10-CM

## 2023-12-16 ENCOUNTER — Other Ambulatory Visit: Payer: Self-pay | Admitting: Nurse Practitioner

## 2023-12-31 ENCOUNTER — Other Ambulatory Visit: Payer: Self-pay | Admitting: Nurse Practitioner

## 2023-12-31 DIAGNOSIS — E785 Hyperlipidemia, unspecified: Secondary | ICD-10-CM

## 2024-03-04 ENCOUNTER — Other Ambulatory Visit: Payer: Self-pay | Admitting: Nurse Practitioner

## 2024-03-04 DIAGNOSIS — F3341 Major depressive disorder, recurrent, in partial remission: Secondary | ICD-10-CM

## 2024-03-09 ENCOUNTER — Other Ambulatory Visit: Payer: Self-pay | Admitting: Nurse Practitioner

## 2024-03-09 DIAGNOSIS — G5691 Unspecified mononeuropathy of right upper limb: Secondary | ICD-10-CM

## 2024-03-09 DIAGNOSIS — F5101 Primary insomnia: Secondary | ICD-10-CM

## 2024-03-09 DIAGNOSIS — R600 Localized edema: Secondary | ICD-10-CM

## 2024-03-09 DIAGNOSIS — L247 Irritant contact dermatitis due to plants, except food: Secondary | ICD-10-CM

## 2024-03-14 NOTE — Telephone Encounter (Signed)
 Appt December 4th.

## 2024-03-14 NOTE — Telephone Encounter (Signed)
 MMM NTBS for 6 mos FU RF sent for 30-d

## 2024-03-17 ENCOUNTER — Encounter: Payer: Self-pay | Admitting: Nurse Practitioner

## 2024-03-17 ENCOUNTER — Ambulatory Visit: Admitting: Nurse Practitioner

## 2024-03-17 VITALS — BP 134/90 | HR 87 | Temp 97.8°F | Ht 61.0 in | Wt 197.0 lb

## 2024-03-17 DIAGNOSIS — E876 Hypokalemia: Secondary | ICD-10-CM

## 2024-03-17 DIAGNOSIS — R6510 Systemic inflammatory response syndrome (SIRS) of non-infectious origin without acute organ dysfunction: Secondary | ICD-10-CM

## 2024-03-17 DIAGNOSIS — G5691 Unspecified mononeuropathy of right upper limb: Secondary | ICD-10-CM

## 2024-03-17 DIAGNOSIS — I471 Supraventricular tachycardia, unspecified: Secondary | ICD-10-CM

## 2024-03-17 DIAGNOSIS — E785 Hyperlipidemia, unspecified: Secondary | ICD-10-CM

## 2024-03-17 DIAGNOSIS — G894 Chronic pain syndrome: Secondary | ICD-10-CM

## 2024-03-17 DIAGNOSIS — I8393 Asymptomatic varicose veins of bilateral lower extremities: Secondary | ICD-10-CM

## 2024-03-17 DIAGNOSIS — C8590 Non-Hodgkin lymphoma, unspecified, unspecified site: Secondary | ICD-10-CM

## 2024-03-17 DIAGNOSIS — F3341 Major depressive disorder, recurrent, in partial remission: Secondary | ICD-10-CM

## 2024-03-17 DIAGNOSIS — F5101 Primary insomnia: Secondary | ICD-10-CM

## 2024-03-17 DIAGNOSIS — G4700 Insomnia, unspecified: Secondary | ICD-10-CM

## 2024-03-17 DIAGNOSIS — R600 Localized edema: Secondary | ICD-10-CM

## 2024-03-17 DIAGNOSIS — R739 Hyperglycemia, unspecified: Secondary | ICD-10-CM

## 2024-03-17 DIAGNOSIS — R569 Unspecified convulsions: Secondary | ICD-10-CM

## 2024-03-17 DIAGNOSIS — Z23 Encounter for immunization: Secondary | ICD-10-CM

## 2024-03-17 DIAGNOSIS — D61818 Other pancytopenia: Secondary | ICD-10-CM

## 2024-03-17 LAB — URINALYSIS, COMPLETE
Glucose, UA: NEGATIVE
Ketones, UA: NEGATIVE
Nitrite, UA: POSITIVE — AB
Specific Gravity, UA: 1.025 (ref 1.005–1.030)
Urobilinogen, Ur: 4 mg/dL — ABNORMAL HIGH (ref 0.2–1.0)
pH, UA: 5.5 (ref 5.0–7.5)

## 2024-03-17 LAB — MICROSCOPIC EXAMINATION
RBC, Urine: NONE SEEN /HPF (ref 0–2)
Renal Epithel, UA: NONE SEEN /HPF
Yeast, UA: NONE SEEN

## 2024-03-17 LAB — LIPID PANEL

## 2024-03-17 LAB — BAYER DCA HB A1C WAIVED: HB A1C (BAYER DCA - WAIVED): 6.3 % — ABNORMAL HIGH (ref 4.8–5.6)

## 2024-03-17 MED ORDER — DULOXETINE HCL 60 MG PO CPEP: 120.0000 mg | ORAL_CAPSULE | Freq: Every day | ORAL | 1 refills | Status: AC

## 2024-03-17 MED ORDER — ISOSORBIDE DINITRATE 10 MG PO TABS: 10.0000 mg | ORAL_TABLET | Freq: Two times a day (BID) | ORAL | 1 refills | Status: AC

## 2024-03-17 MED ORDER — PREGABALIN 75 MG PO CAPS: ORAL_CAPSULE | ORAL | 1 refills | Status: AC

## 2024-03-17 MED ORDER — DULOXETINE HCL 30 MG PO CPEP: 30.0000 mg | ORAL_CAPSULE | Freq: Every day | ORAL | 1 refills | Status: AC

## 2024-03-17 MED ORDER — TRAZODONE HCL 50 MG PO TABS: ORAL_TABLET | ORAL | 1 refills | Status: DC

## 2024-03-17 MED ORDER — APIXABAN 5 MG PO TABS: 5.0000 mg | ORAL_TABLET | Freq: Two times a day (BID) | ORAL | 1 refills | Status: AC

## 2024-03-17 MED ORDER — FUROSEMIDE 40 MG PO TABS: 40.0000 mg | ORAL_TABLET | Freq: Every day | ORAL | 1 refills | Status: DC

## 2024-03-17 MED ORDER — ATORVASTATIN CALCIUM 80 MG PO TABS: 80.0000 mg | ORAL_TABLET | Freq: Every day | ORAL | 1 refills | Status: AC

## 2024-03-17 NOTE — Addendum Note (Signed)
 Addended by: GLADIS MUSTARD on: 03/17/2024 10:29 AM   Modules accepted: Orders

## 2024-03-17 NOTE — Patient Instructions (Signed)
 Insomnia Insomnia is a sleep disorder that makes it difficult to fall asleep or stay asleep. Insomnia can cause fatigue, low energy, difficulty concentrating, mood swings, and poor performance at work or school. There are three different ways to classify insomnia: Difficulty falling asleep. Difficulty staying asleep. Waking up too early in the morning. Any type of insomnia can be long-term (chronic) or short-term (acute). Both are common. Short-term insomnia usually lasts for 3 months or less. Chronic insomnia occurs at least three times a week for longer than 3 months. What are the causes? Insomnia may be caused by another condition, situation, or substance, such as: Having certain mental health conditions, such as anxiety and depression. Using caffeine, alcohol , tobacco, or drugs. Having gastrointestinal conditions, such as gastroesophageal reflux disease (GERD). Having certain medical conditions. These include: Asthma. Alzheimer's disease. Stroke. Chronic pain. An overactive thyroid  gland (hyperthyroidism). Other sleep disorders, such as restless legs syndrome and sleep apnea. Menopause. Sometimes, the cause of insomnia may not be known. What increases the risk? Risk factors for insomnia include: Gender. Females are affected more often than males. Age. Insomnia is more common as people get older. Stress and certain medical and mental health conditions. Lack of exercise. Having an irregular work schedule. This may include working night shifts and traveling between different time zones. What are the signs or symptoms? If you have insomnia, the main symptom is having trouble falling asleep or having trouble staying asleep. This may lead to other symptoms, such as: Feeling tired or having low energy. Feeling nervous about going to sleep. Not feeling rested in the morning. Having trouble concentrating. Feeling irritable, anxious, or depressed. How is this diagnosed? This condition  may be diagnosed based on: Your symptoms and medical history. Your health care provider may ask about: Your sleep habits. Any medical conditions you have. Your mental health. A physical exam. How is this treated? Treatment for insomnia depends on the cause. Treatment may focus on treating an underlying condition that is causing the insomnia. Treatment may also include: Medicines to help you sleep. Counseling or therapy. Lifestyle adjustments to help you sleep better. Follow these instructions at home: Eating and drinking  Limit or avoid alcohol , caffeinated beverages, and products that contain nicotine and tobacco, especially close to bedtime. These can disrupt your sleep. Do not eat a large meal or eat spicy foods right before bedtime. This can lead to digestive discomfort that can make it hard for you to sleep. Sleep habits  Keep a sleep diary to help you and your health care provider figure out what could be causing your insomnia. Write down: When you sleep. When you wake up during the night. How well you sleep and how rested you feel the next day. Any side effects of medicines you are taking. What you eat and drink. Make your bedroom a dark, comfortable place where it is easy to fall asleep. Put up shades or blackout curtains to block light from outside. Use a white noise machine to block noise. Keep the temperature cool. Limit screen use before bedtime. This includes: Not watching TV. Not using your smartphone, tablet, or computer. Stick to a routine that includes going to bed and waking up at the same times every day and night. This can help you fall asleep faster. Consider making a quiet activity, such as reading, part of your nighttime routine. Try to avoid taking naps during the day so that you sleep better at night. Get out of bed if you are still awake after  15 minutes of trying to sleep. Keep the lights down, but try reading or doing a quiet activity. When you feel  sleepy, go back to bed. General instructions Take over-the-counter and prescription medicines only as told by your health care provider. Exercise regularly as told by your health care provider. However, avoid exercising in the hours right before bedtime. Use relaxation techniques to manage stress. Ask your health care provider to suggest some techniques that may work well for you. These may include: Breathing exercises. Routines to release muscle tension. Visualizing peaceful scenes. Make sure that you drive carefully. Do not drive if you feel very sleepy. Keep all follow-up visits. This is important. Contact a health care provider if: You are tired throughout the day. You have trouble in your daily routine due to sleepiness. You continue to have sleep problems, or your sleep problems get worse. Get help right away if: You have thoughts about hurting yourself or someone else. Get help right away if you feel like you may hurt yourself or others, or have thoughts about taking your own life. Go to your nearest emergency room or: Call 911. Call the National Suicide Prevention Lifeline at 2232757840 or 988. This is open 24 hours a day. Text the Crisis Text Line at 657-529-4371. Summary Insomnia is a sleep disorder that makes it difficult to fall asleep or stay asleep. Insomnia can be long-term (chronic) or short-term (acute). Treatment for insomnia depends on the cause. Treatment may focus on treating an underlying condition that is causing the insomnia. Keep a sleep diary to help you and your health care provider figure out what could be causing your insomnia. This information is not intended to replace advice given to you by your health care provider. Make sure you discuss any questions you have with your health care provider. Document Revised: 03/11/2021 Document Reviewed: 03/11/2021 Elsevier Patient Education  2024 [employer].

## 2024-03-17 NOTE — Progress Notes (Signed)
 Subjective:    Patient ID: Angela Michael, female    DOB: 04-Oct-1960, 63 y.o.   MRN: [id number]   Chief Complaint: medical management of chronic issues     HPI:  Angela Michael is a 63 y.o. who identifies as a female who was assigned female at birth.   Social history: Lives with: alone Work history: disability   Comes in today for follow up of the following chronic medical issues:  1. Hyperlipidemia with target LDL less than 100 Does not watch diet and does no dedicated exercise Lab Results  Component Value Date   CHOL 167 05/18/2023   HDL 53 05/18/2023   LDLCALC 91 05/18/2023   TRIG 129 05/18/2023   CHOLHDL 3.2 05/18/2023     2. SVT (supraventricular tachycardia) No recent palpitations or heart racing. Is still or isordil .  3. Asymptomatic varicose veins of both lower extremities Do not really bother her  4. Hypokalemia Denies muscle cramps Lab Results  Component Value Date   K 4.4 05/18/2023     5. Seizures (HCC) Is on keppra  and reports no seizure activity. Has not seen  neurologyin awhile.  6. Peripheral edema Has edema of lower ext daily  7. Non-Hodgkin's lymphoma, unspecified body region, unspecified non-Hodgkin lymphoma type [facility]) Last saw  oncology on 02/18/22. Treatment ended in 2018. At last visit their was no reoccurence.  8. Insomnia, unspecified type Is on trazadone to sleep and sleeps about 7-8hours a night.  9. Neuropathy of right upper extremity Takes lyrica  daily. Has constant burning in her feet.  10. Pancytopenia (HCC) Lab Results  Component Value Date   WBC 5.4 05/18/2023   HGB 13.7 05/18/2023   HCT 42.6 05/18/2023   MCV 97 05/18/2023   PLT 170 05/18/2023     11. Chronic pain syndrome See pain management  12. SIRS (systemic inflammatory response syndrome) (HCC) No complaints today  13. Recurrent major depressive disorder, in partial remission (HCC) Her husband passed away over a year ago ago. She is having a hard  time. She had yo move out of her house because she could not afford electricity bill, so electricity was cut off to the house. She had to stay on a friends house for several months. She has since moved back to her house and is tryig to get it redy to sell. She takes her cymbalta  daily and that helps her alot    03/17/2024    9:58 AM 10/30/2023    8:53 AM 05/18/2023   11:54 AM  Depression screen PHQ 2/9  Decreased Interest 0 0 0  Down, Depressed, Hopeless 1 0 1  PHQ - 2 Score 1 0 1  Altered sleeping 0 0 3  Tired, decreased energy 1 0 0  Change in appetite 0 0 0  Feeling bad or failure about yourself  0 0 0  Trouble concentrating 0 0 0  Moving slowly or fidgety/restless 0 0 0  Suicidal thoughts 0 0 0  PHQ-9 Score 2 0  4   Difficult doing work/chores Not difficult at all Not difficult at all Not difficult at all     Data saved with a previous flowsheet row definition        14. Morbid obesity (HCC) She had gastric bypass surgery years ago and has been putting her weight back on for some time. Weight is down 8lbs Wt Readings from Last 3 Encounters:  03/17/24 197 lb (89.4 kg)  10/30/23 200 lb (90.7 kg)  05/18/23 183 lb (83  kg)   BMI Readings from Last 3 Encounters:  03/17/24 37.22 kg/m  10/30/23 37.79 kg/m  05/18/23 34.58 kg/m     New complaints: In th elast 2 years patients husband has died, and her mom. Patient has her house up for sale. She had sold it but peoples loan didn't go through.  Allergies  Allergen Reactions   Advicor [Niacin-Lovastatin Er] Rash    ALLERGY / INTOLERANCE   Niacin Rash   Outpatient Encounter Medications as of 03/17/2024  Medication Sig   acetaminophen  (TYLENOL ) 500 MG tablet Take 1,000 mg every 6 (six) hours as needed by mouth for moderate pain or headache.   aspirin  81 MG tablet Take 81 mg by mouth daily. Reported on 06/21/2015   atorvastatin  (LIPITOR) 80 MG tablet Take 1 tablet (80 mg total) by mouth daily. **NEEDS TO BE SEEN BEFORE NEXT  REFILL**   BUTRANS 20 MCG/HR PTWK 1 patch once a week.   cyanocobalamin  (VITAMIN B12) 1000 MCG/ML injection INJECT 1 ML (1,000 MCG) INTRAMUSCULARLY EVERY 30 DAYS   cyclobenzaprine  (FLEXERIL ) 5 MG tablet TAKE 1 TABLET BY MOUTH EVERY DAY AT BEDTIME   DULoxetine  (CYMBALTA ) 30 MG capsule TAKE ONE CAP (30MG ) AT BEDTIME. TAKE THIS IN ADDITION TO THE 60 MG EACH MORNING FOR 90 MG/24HR.   DULoxetine  (CYMBALTA ) 60 MG capsule Take 2 capsules (120 mg total) by mouth daily. **NEEDS TO BE SEEN BEFORE NEXT REFILL**   fluticasone  (FLONASE ) 50 MCG/ACT nasal spray PLACE 2 SPRAYS INTO THE NOSE AS NEEDED. FOR ALLERGIES   furosemide  (LASIX ) 40 MG tablet TAKE 1 TABLET BY MOUTH EVERY DAY   isosorbide  dinitrate (ISORDIL ) 10 MG tablet Take 1 tablet (10 mg total) by mouth 2 (two) times daily.   levETIRAcetam  (KEPPRA ) 500 MG tablet TAKE 2 TABLETS BY MOUTH TWICE A DAY   loratadine (CLARITIN) 10 MG tablet Take 10 mg daily as needed by mouth for allergies.    Multiple Vitamin (MULTIVITAMIN WITH MINERALS) TABS Take 1 tablet by mouth daily. Reported on 06/21/2015   nitroGLYCERIN  (NITROSTAT ) 0.4 MG SL tablet Place 1 tablet (0.4 mg total) under the tongue every 5 (five) minutes as needed for chest pain.   polyethylene glycol powder (GLYCOLAX /MIRALAX ) powder Take 17 g by mouth daily as needed. (Patient taking differently: Take 17 g by mouth daily as needed for moderate constipation.)   potassium chloride  SA (KLOR-CON  M20) 20 MEQ tablet Take 1 tablet (20 mEq total) by mouth daily.   predniSONE  (DELTASONE ) 20 MG tablet 2 po at sametime daily for 5 days-   pregabalin  (LYRICA ) 75 MG capsule TAKE 1 CAPSULE IN THE MORNING, 1 CAPSULE IN THE AFTERNOON, AND 2 CAPSULES AT NIGHT.   traZODone  (DESYREL ) 50 MG tablet TAKE 1-3 TABLETS BY MOUTH AT BEDTIME   triamcinolone  cream (KENALOG ) 0.1 % APPLY TO AFFECTED AREA TWICE A DAY   No facility-administered encounter medications on file as of 03/17/2024.    Past Surgical History:  Procedure  Laterality Date   ABDOMINOPLASTY N/A 03/02/2017   Procedure: BACK LIPECTOMY;  Surgeon: Arelia Filippo, MD;  Location: [facility] OR;  Service: Plastics;  Laterality: N/A;   ABDOMINOPLASTY/PANNICULECTOMY N/A 03/02/2017   Procedure: ABDOMINOPLASTY/PANNICULECTOMY;  Surgeon: Arelia Filippo, MD;  Location: [facility] OR;  Service: Plastics;  Laterality: N/A;   BREAST SURGERY      uplift   CATARACT EXTRACTION Right    CATARACT EXTRACTION W/ INTRAOCULAR LENS  IMPLANT, BILATERAL     CHOLECYSTECTOMY     COLONOSCOPY     ENDOVENOUS ABLATION SAPHENOUS VEIN  W/ LASER Right 09-08-2013   EVLA right small saphenous vein and stab phlebectomy 10-20 incisions right leg by Krystal Doing MD   GASTRIC BYPASS  2007   Says she had lost 345 lbs   LYMPH NODE DISSECTION Left    Resected   lymph node removed from stomach     MEDIAL PARTIAL KNEE REPLACEMENT  05/10/14   left knee   MEDIAL PARTIAL KNEE REPLACEMENT Left 06/04/2015   [facility]   SKIN SURGERY     Removal of excess skin   TOOTH EXTRACTION Right 02/20/2017   VENA CAVA FILTER PLACEMENT     VENOUS THROMBECTOMY     Blood clot resection from right arm    Family History  Problem Relation Age of Onset   Liver cancer Father    Diabetes Father    Kidney cancer Father    Heart failure Brother    Heart attack Brother    Tuberculosis Maternal Grandmother    Bone cancer Maternal Grandfather    Diabetes Paternal Uncle    Lung cancer Mother    Coronary artery disease Neg Hx    Sudden death Neg Hx    Cardiomyopathy Neg Hx    Esophageal cancer Neg Hx    Stomach cancer Neg Hx       Controlled substance contract: n/a     Review of Systems  Constitutional:  Negative for diaphoresis.  Eyes:  Negative for pain.  Respiratory:  Negative for shortness of breath.   Cardiovascular:  Negative for chest pain, palpitations and leg swelling.  Gastrointestinal:  Negative for abdominal pain.  Endocrine: Negative for polydipsia.  Skin:  Negative for rash.  Neurological:   Negative for dizziness, weakness and headaches.  Hematological:  Does not bruise/bleed easily.  All other systems reviewed and are negative.      Objective:   Physical Exam Vitals and nursing note reviewed.  Constitutional:      General: She is not in acute distress.    Appearance: Normal appearance. She is well-developed.  HENT:     Head: Normocephalic.     Right Ear: Tympanic membrane normal.     Left Ear: Tympanic membrane normal.     Nose: Nose normal.     Mouth/Throat:     Mouth: Mucous membranes are moist.  Eyes:     Pupils: Pupils are equal, round, and reactive to light.  Neck:     Vascular: No carotid bruit or JVD.  Cardiovascular:     Rate and Rhythm: Normal rate and regular rhythm.     Heart sounds: Normal heart sounds.  Pulmonary:     Effort: Pulmonary effort is normal. No respiratory distress.     Breath sounds: Normal breath sounds. No wheezing or rales.  Chest:     Chest wall: No tenderness.  Abdominal:     General: Bowel sounds are normal. There is no distension or abdominal bruit.     Palpations: Abdomen is soft. There is no hepatomegaly, splenomegaly, mass or pulsatile mass.     Tenderness: There is no abdominal tenderness.  Musculoskeletal:        General: Normal range of motion.     Cervical back: Normal range of motion and neck supple.  Lymphadenopathy:     Cervical: No cervical adenopathy.  Skin:    General: Skin is warm and dry.  Neurological:     Mental Status: She is alert and oriented to person, place, and time.     Deep Tendon Reflexes: Reflexes  are normal and symmetric.  Psychiatric:        Behavior: Behavior normal.        Thought Content: Thought content normal.        Judgment: Judgment normal.    BP (!) 134/90   Pulse 87   Temp 97.8 F (36.6 C) (Temporal)   Ht 5' 1 (1.549 m)   Wt 197 lb (89.4 kg)   SpO2 93%   BMI 37.22 kg/m           Assessment & Plan:   Shuntel Fishburn comes in today with chief complaint of No  chief complaint on file.   Diagnosis and orders addressed:  1. Hyperlipidemia with target LDL less than 100 Low fat diet - CMP14+EGFR - Lipid panel  2. SVT (supraventricular tachycardia) Avoid caffeine - isosorbide  dinitrate (ISORDIL ) 10 MG tablet; Take 1 tablet (10 mg total) by mouth 2 (two) times daily.  Dispense: 180 tablet; Refill: 1  3. Asymptomatic varicose veins of both lower extremities Wear compression hose  4. Hypokalemia Labs pending  5. Seizures (HCC) Report any seizure activity - levETIRAcetam  (KEPPRA ) 500 MG tablet; TAKE 2 TABLETS BY MOUTH 2 TIMES DAILY  Dispense: 360 tablet; Refill: 1  6. Peripheral edema Elevate legs when sitting - furosemide  (LASIX ) 40 MG tablet; Take 1 tablet (40 mg total) by mouth daily.  Dispense: 90 tablet; Refill: 1  7. Non-Hodgkin's lymphoma, unspecified body region, unspecified non-Hodgkin lymphoma type [facility]) Keep follow up with oncology  8. Insomnia, unspecified type Bedtime routine  9. Neuropathy of right upper extremity Do not go bare footed  10. Pancytopenia (HCC) Labs pending - Vitamin B12 - CBC with Differential/Platelet  11. Chronic pain syndrome Keep follow up with pain management  12. SIRS (systemic inflammatory response syndrome) (HCC)   13. Recurrent major depressive disorder, in partial remission (HCC) Stress management - DULoxetine  (CYMBALTA ) 60 MG capsule; Take 2 capsules (120 mg total) by mouth daily.  Dispense: 180 capsule; Refill: 1  14. Morbid obesity (HCC) Discussed diet and exercise for person with BMI >25 Will recheck weight in 3-6 months    Labs pending Health Maintenance reviewed Diet and exercise encouraged  Follow up plan: 6 months   Mary-Margaret Gladis, FNP

## 2024-03-17 NOTE — Addendum Note (Signed)
 Addended by: GLADIS MUSTARD on: 03/17/2024 10:28 AM   Modules accepted: Orders

## 2024-03-18 ENCOUNTER — Ambulatory Visit: Payer: Self-pay | Admitting: Nurse Practitioner

## 2024-03-18 LAB — CMP14+EGFR
ALT: 20 IU/L (ref 0–32)
AST: 18 IU/L (ref 0–40)
Albumin: 3.9 g/dL (ref 3.9–4.9)
Alkaline Phosphatase: 133 IU/L (ref 49–135)
BUN/Creatinine Ratio: 20 (ref 12–28)
BUN: 17 mg/dL (ref 8–27)
Bilirubin Total: 0.9 mg/dL (ref 0.0–1.2)
CO2: 25 mmol/L (ref 20–29)
Calcium: 8.9 mg/dL (ref 8.7–10.3)
Chloride: 98 mmol/L (ref 96–106)
Creatinine, Ser: 0.83 mg/dL (ref 0.57–1.00)
Globulin, Total: 2.1 g/dL (ref 1.5–4.5)
Glucose: 149 mg/dL — ABNORMAL HIGH (ref 70–99)
Potassium: 3.5 mmol/L (ref 3.5–5.2)
Sodium: 140 mmol/L (ref 134–144)
Total Protein: 6 g/dL (ref 6.0–8.5)
eGFR: 80 mL/min/1.73 (ref >59)

## 2024-03-18 LAB — LIPID PANEL
Cholesterol, Total: 227 mg/dL — AB (ref 100–199)
HDL: 47 mg/dL (ref >39)
LDL CALC COMMENT:: 4.8 ratio — AB (ref 0.0–4.4)
LDL Chol Calc (NIH): 148 mg/dL — AB (ref 0–99)
Triglycerides: 179 mg/dL — AB (ref 0–149)
VLDL Cholesterol Cal: 32 mg/dL (ref 5–40)

## 2024-03-18 LAB — CBC WITH DIFFERENTIAL/PLATELET
Basophils Absolute: 0 x10E3/uL (ref 0.0–0.2)
Basos: 1 %
EOS (ABSOLUTE): 0.1 x10E3/uL (ref 0.0–0.4)
Eos: 1 %
Hematocrit: 40.9 % (ref 34.0–46.6)
Hemoglobin: 13.5 g/dL (ref 11.1–15.9)
Immature Grans (Abs): 0 x10E3/uL (ref 0.0–0.1)
Immature Granulocytes: 0 %
Lymphocytes Absolute: 1.4 x10E3/uL (ref 0.7–3.1)
Lymphs: 23 %
MCH: 31.8 pg (ref 26.6–33.0)
MCHC: 33 g/dL (ref 31.5–35.7)
MCV: 97 fL (ref 79–97)
Monocytes Absolute: 0.4 x10E3/uL (ref 0.1–0.9)
Monocytes: 7 %
Neutrophils Absolute: 4.1 x10E3/uL (ref 1.4–7.0)
Neutrophils: 67 %
Platelets: 189 x10E3/uL (ref 150–450)
RBC: 4.24 x10E6/uL (ref 3.77–5.28)
RDW: 12.1 % (ref 11.7–15.4)
WBC: 6 x10E3/uL (ref 3.4–10.8)

## 2024-03-18 MED ORDER — SULFAMETHOXAZOLE-TRIMETHOPRIM 800-160 MG PO TABS: 1.0000 | ORAL_TABLET | Freq: Two times a day (BID) | ORAL | 0 refills | Status: AC

## 2024-03-18 NOTE — Telephone Encounter (Signed)
 Pt returned call and verbalized understanding

## 2024-03-25 ENCOUNTER — Other Ambulatory Visit: Payer: Self-pay

## 2024-03-25 DIAGNOSIS — C8590 Non-Hodgkin lymphoma, unspecified, unspecified site: Secondary | ICD-10-CM

## 2024-03-25 DIAGNOSIS — K439 Ventral hernia without obstruction or gangrene: Secondary | ICD-10-CM

## 2024-03-25 DIAGNOSIS — M79671 Pain in right foot: Secondary | ICD-10-CM

## 2024-03-25 DIAGNOSIS — I471 Supraventricular tachycardia, unspecified: Secondary | ICD-10-CM

## 2024-03-25 DIAGNOSIS — I8393 Asymptomatic varicose veins of bilateral lower extremities: Secondary | ICD-10-CM

## 2024-03-25 DIAGNOSIS — I739 Peripheral vascular disease, unspecified: Secondary | ICD-10-CM

## 2024-03-25 DIAGNOSIS — L409 Psoriasis, unspecified: Secondary | ICD-10-CM

## 2024-03-25 DIAGNOSIS — G894 Chronic pain syndrome: Secondary | ICD-10-CM

## 2024-03-25 DIAGNOSIS — G8929 Other chronic pain: Secondary | ICD-10-CM

## 2024-03-28 ENCOUNTER — Telehealth: Payer: Self-pay | Admitting: Nurse Practitioner

## 2024-03-28 NOTE — Telephone Encounter (Signed)
 Copied from CRM #[id number]. Topic: Referral - Status >> Mar 28, 2024  3:46 PM Avram MATSU wrote: Reason for CRM: Delon is calling from [facility] and stated a referral has been sent to the office and she has been est for a long time. Please advise [phone]

## 2024-04-10 ENCOUNTER — Other Ambulatory Visit: Payer: Self-pay | Admitting: Nurse Practitioner

## 2024-04-10 DIAGNOSIS — F5101 Primary insomnia: Secondary | ICD-10-CM

## 2024-04-11 ENCOUNTER — Other Ambulatory Visit: Payer: Self-pay | Admitting: Nurse Practitioner

## 2024-04-11 DIAGNOSIS — G5691 Unspecified mononeuropathy of right upper limb: Secondary | ICD-10-CM

## 2024-04-11 DIAGNOSIS — F5101 Primary insomnia: Secondary | ICD-10-CM

## 2024-05-03 ENCOUNTER — Other Ambulatory Visit: Payer: Self-pay | Admitting: Nurse Practitioner

## 2024-05-10 ENCOUNTER — Encounter: Admitting: Nurse Practitioner

## 2024-05-16 ENCOUNTER — Other Ambulatory Visit: Payer: Self-pay | Admitting: Nurse Practitioner

## 2024-05-16 DIAGNOSIS — R600 Localized edema: Secondary | ICD-10-CM

## 2024-09-12 ENCOUNTER — Ambulatory Visit: Admitting: Nurse Practitioner
